# Patient Record
Sex: Male | Born: 1965 | Race: White | Hispanic: No | Smoking: Current every day smoker
Health system: Southern US, Community
[De-identification: ages and names within clinical notes are randomized; demographics above are authoritative.]

## PROBLEM LIST (undated history)

## (undated) VITALS — BP 114/69 | HR 68 | Temp 97.3°F | Resp 18 | Ht 70.0 in | Wt 187.0 lb

## (undated) DIAGNOSIS — F319 Bipolar disorder, unspecified: Secondary | ICD-10-CM

## (undated) DIAGNOSIS — F4310 Post-traumatic stress disorder, unspecified: Secondary | ICD-10-CM

## (undated) DIAGNOSIS — I10 Essential (primary) hypertension: Secondary | ICD-10-CM

## (undated) DIAGNOSIS — F32A Depression, unspecified: Secondary | ICD-10-CM

## (undated) DIAGNOSIS — F329 Major depressive disorder, single episode, unspecified: Secondary | ICD-10-CM

---

## 2011-06-16 ENCOUNTER — Emergency Department (HOSPITAL_COMMUNITY): Payer: Self-pay

## 2011-06-16 ENCOUNTER — Emergency Department (HOSPITAL_COMMUNITY)
Admission: EM | Admit: 2011-06-16 | Discharge: 2011-06-16 | Disposition: A | Discharge status: Home / Self Care | Payer: Self-pay | Attending: Emergency Medicine | Admitting: Emergency Medicine

## 2011-06-16 DIAGNOSIS — R51 Headache: Secondary | ICD-10-CM | POA: Insufficient documentation

## 2011-06-16 DIAGNOSIS — IMO0002 Reserved for concepts with insufficient information to code with codable children: Secondary | ICD-10-CM | POA: Insufficient documentation

## 2011-06-16 DIAGNOSIS — Y9229 Other specified public building as the place of occurrence of the external cause: Secondary | ICD-10-CM | POA: Insufficient documentation

## 2011-06-16 DIAGNOSIS — S060X1A Concussion with loss of consciousness of 30 minutes or less, initial encounter: Secondary | ICD-10-CM | POA: Insufficient documentation

## 2011-06-16 DIAGNOSIS — I10 Essential (primary) hypertension: Secondary | ICD-10-CM | POA: Insufficient documentation

## 2011-06-16 DIAGNOSIS — Z79899 Other long term (current) drug therapy: Secondary | ICD-10-CM | POA: Insufficient documentation

## 2011-06-16 DIAGNOSIS — S0083XA Contusion of other part of head, initial encounter: Secondary | ICD-10-CM | POA: Insufficient documentation

## 2011-06-16 DIAGNOSIS — S0003XA Contusion of scalp, initial encounter: Secondary | ICD-10-CM | POA: Insufficient documentation

## 2011-09-22 ENCOUNTER — Emergency Department (HOSPITAL_COMMUNITY)
Admission: EM | Admit: 2011-09-22 | Discharge: 2011-09-23 | Disposition: A | Discharge status: Other Acute Inpatient Hospital | Payer: Self-pay | Attending: Emergency Medicine | Admitting: Emergency Medicine

## 2011-09-22 DIAGNOSIS — R45851 Suicidal ideations: Secondary | ICD-10-CM | POA: Insufficient documentation

## 2011-09-22 DIAGNOSIS — F329 Major depressive disorder, single episode, unspecified: Secondary | ICD-10-CM | POA: Insufficient documentation

## 2011-09-22 DIAGNOSIS — I10 Essential (primary) hypertension: Secondary | ICD-10-CM | POA: Insufficient documentation

## 2011-09-22 DIAGNOSIS — F3289 Other specified depressive episodes: Secondary | ICD-10-CM | POA: Insufficient documentation

## 2011-09-22 LAB — CBC
HCT: 48.5 % (ref 39.0–52.0)
Hemoglobin: 16.9 g/dL (ref 13.0–17.0)
MCV: 88.7 fL (ref 78.0–100.0)
Platelets: 217 10*3/uL (ref 150–400)
RBC: 5.47 MIL/uL (ref 4.22–5.81)
WBC: 12.4 10*3/uL — ABNORMAL HIGH (ref 4.0–10.5)

## 2011-09-22 LAB — BASIC METABOLIC PANEL
BUN: 15 mg/dL (ref 6–23)
Chloride: 101 mEq/L (ref 96–112)
GFR calc Af Amer: >60 mL/min (ref >60)
Glucose, Bld: 88 mg/dL (ref 70–99)
Potassium: 3.8 mEq/L (ref 3.5–5.1)

## 2011-09-22 LAB — RAPID URINE DRUG SCREEN, HOSP PERFORMED
Amphetamines: NOT DETECTED
Benzodiazepines: POSITIVE — AB

## 2011-09-22 LAB — DIFFERENTIAL
Lymphocytes Relative: 22 % (ref 12–46)
Lymphs Abs: 2.7 10*3/uL (ref 0.7–4.0)
Neutro Abs: 8.5 10*3/uL — ABNORMAL HIGH (ref 1.7–7.7)
Neutrophils Relative %: 69 % (ref 43–77)

## 2011-09-23 ENCOUNTER — Inpatient Hospital Stay (HOSPITAL_COMMUNITY)
Admission: RE | Admit: 2011-09-23 | Discharge: 2011-10-04 | DRG: 885 | Disposition: A | Discharge status: Home / Self Care | Payer: PRIVATE HEALTH INSURANCE | Source: Ambulatory Visit | Attending: Psychiatry | Admitting: Psychiatry

## 2011-09-23 DIAGNOSIS — IMO0002 Reserved for concepts with insufficient information to code with codable children: Secondary | ICD-10-CM

## 2011-09-23 DIAGNOSIS — Z5900 Homelessness unspecified: Secondary | ICD-10-CM

## 2011-09-23 DIAGNOSIS — F329 Major depressive disorder, single episode, unspecified: Principal | ICD-10-CM

## 2011-09-23 DIAGNOSIS — Z6379 Other stressful life events affecting family and household: Secondary | ICD-10-CM

## 2011-09-23 DIAGNOSIS — R45851 Suicidal ideations: Secondary | ICD-10-CM

## 2011-09-23 DIAGNOSIS — F1910 Other psychoactive substance abuse, uncomplicated: Secondary | ICD-10-CM

## 2011-09-23 DIAGNOSIS — X58XXXA Exposure to other specified factors, initial encounter: Secondary | ICD-10-CM

## 2011-09-23 DIAGNOSIS — I10 Essential (primary) hypertension: Secondary | ICD-10-CM

## 2011-09-25 DIAGNOSIS — F1210 Cannabis abuse, uncomplicated: Secondary | ICD-10-CM

## 2011-09-25 DIAGNOSIS — F329 Major depressive disorder, single episode, unspecified: Secondary | ICD-10-CM

## 2011-10-01 LAB — WOUND CULTURE: Gram Stain: NONE SEEN

## 2011-10-02 LAB — COMPREHENSIVE METABOLIC PANEL
ALT: 39 U/L (ref 0–53)
AST: 31 U/L (ref 0–37)
Albumin: 3.8 g/dL (ref 3.5–5.2)
Alkaline Phosphatase: 79 U/L (ref 39–117)
Glucose, Bld: 90 mg/dL (ref 70–99)
Potassium: 3.7 mEq/L (ref 3.5–5.1)
Sodium: 140 mEq/L (ref 135–145)
Total Protein: 6.6 g/dL (ref 6.0–8.3)
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
## 2012-03-05 ENCOUNTER — Encounter (HOSPITAL_COMMUNITY): Payer: Self-pay | Admitting: Emergency Medicine

## 2012-03-05 ENCOUNTER — Emergency Department (HOSPITAL_COMMUNITY): Payer: Medicaid Other

## 2012-03-05 ENCOUNTER — Emergency Department (HOSPITAL_COMMUNITY)
Admission: EM | Admit: 2012-03-05 | Discharge: 2012-03-05 | Disposition: A | Discharge status: Home / Self Care | Payer: Medicaid Other | Attending: Emergency Medicine | Admitting: Emergency Medicine

## 2012-03-05 ENCOUNTER — Other Ambulatory Visit: Payer: Self-pay

## 2012-03-05 DIAGNOSIS — R079 Chest pain, unspecified: Secondary | ICD-10-CM | POA: Insufficient documentation

## 2012-03-05 DIAGNOSIS — R0602 Shortness of breath: Secondary | ICD-10-CM | POA: Insufficient documentation

## 2012-03-05 DIAGNOSIS — R112 Nausea with vomiting, unspecified: Secondary | ICD-10-CM | POA: Insufficient documentation

## 2012-03-05 DIAGNOSIS — R062 Wheezing: Secondary | ICD-10-CM | POA: Insufficient documentation

## 2012-03-05 HISTORY — DX: Essential (primary) hypertension: I10

## 2012-03-05 LAB — CBC
HCT: 47.6 % (ref 39.0–52.0)
Hemoglobin: 16.3 g/dL (ref 13.0–17.0)
MCH: 30 pg (ref 26.0–34.0)
MCHC: 34.2 g/dL (ref 30.0–36.0)
MCV: 87.5 fL (ref 78.0–100.0)

## 2012-03-05 LAB — BASIC METABOLIC PANEL
BUN: 16 mg/dL (ref 6–23)
Calcium: 9.4 mg/dL (ref 8.4–10.5)
GFR calc non Af Amer: 80 mL/min — ABNORMAL LOW (ref >90)
Glucose, Bld: 94 mg/dL (ref 70–99)

## 2012-03-05 MED ORDER — ASPIRIN 325 MG PO TABS
325.0000 mg | ORAL_TABLET | ORAL | Status: AC
  Administered 2012-03-05: 325 mg via ORAL
  Filled 2012-03-05: qty 1

## 2012-03-05 NOTE — Progress Notes (Signed)
Pt listed as self pay with no insurance coverage Pt confirms he is self pay [place] resident.  CM and [facility] coordinator spoke with him Pt offered [facility] services to assist with finding a [place] self pay provider Information not accepted pt not interested

## 2012-03-05 NOTE — ED Notes (Signed)
For the past 3 days he has had intemit epigastric chest pain while at rest, some times has sob, states that he also has pain to llq abd hx of bleeding ulcers,

## 2012-03-05 NOTE — ED Notes (Signed)
Has htn hx but has not been taking meds for a year, has been taking intermit bp meds from family members.

## 2012-03-05 NOTE — ED Notes (Signed)
Pt d/c to home after reviewing d/c instructions to f/u with [facility] MD for HTN control. Pt given smoking ceasation information. Education ie collateral circulation, and renel/stroke/MI cautions.

## 2012-03-05 NOTE — Discharge Instructions (Signed)
Read instructions below for reasons to return to the Emergency Department. It is recommended that your follow up with your Primary Care Doctor in regards to today's visit. If you do not have a doctor, use the resource guide listed below to help you find one. Begin taking over the counter Prilosec or Zegrid as directed.   Chest Pain (Nonspecific)  HOME CARE INSTRUCTIONS  For the next few days, avoid physical activities that bring on chest pain. Continue physical activities as directed.  Do not smoke cigarettes or drink alcohol until your symptoms are gone.  Only take over-the-counter or prescription medicine for pain, discomfort, or fever as directed by your caregiver.  Follow your caregiver's suggestions for further testing if your chest pain does not go away.  Keep any follow-up appointments you made. If you do not go to an appointment, you could develop lasting (chronic) problems with pain. If there is any problem keeping an appointment, you must call to reschedule.  SEEK MEDICAL CARE IF:  You think you are having problems from the medicine you are taking. Read your medicine instructions carefully.  Your chest pain does not go away, even after treatment.  You develop a rash with blisters on your chest.  SEEK IMMEDIATE MEDICAL CARE IF:  You have increased chest pain or pain that spreads to your arm, neck, jaw, back, or belly (abdomen).  You develop shortness of breath, an increasing cough, or you are coughing up blood.  You have severe back or abdominal pain, feel sick to your stomach (nauseous) or throw up (vomit).  You develop severe weakness, fainting, or chills.  You have an oral temperature above 102 F (38.9 C), not controlled by medicine.   THIS IS AN EMERGENCY. Do not wait to see if the pain will go away. Get medical help at once. Call your local emergency services (911 in [place] Do not drive yourself to the hospital.   RESOURCE GUIDE  Dental Problems  Patients with  Medicaid: [facility]                     [facility] [address]                                           [address] Phone:  [phone]                                                  Phone:  [phone]  If unable to pay or uninsured, contact:  [facility] or [place] [facility] to become qualified for the adult dental clinic.  Chronic Pain Problems Contact [facility]  [phone] Patients need to be referred by their primary care doctor.  Insufficient Money for Medicine Contact [employer]:  call "211" or [facility] 271-5999.  No Primary Care Doctor Call [facility]  832-8000 Other agencies that provide inexpensive medical care    [facility]  832-8035    [facility]  832-7272    [facility]  [phone]    Women's Clinic  832-4777    [facility]  [phone]    [facility]  [phone]  Psychological Services   [facility]  [phone] [facility]  [phone] [place] [facility]   800 853-5163 (emergency services 641-4993)  Substance Abuse Resources [facility]  336-882-2125 [facility] 336-784-9470 [facility] 336-285-9073 [facility] 336-845-3988 Residential & Outpatient Substance Abuse Program  800-659-3381  Abuse/Neglect [place] Child Abuse Hotline (336) 641-3795 [place] Child Abuse Hotline 800-378-5315 (After Hours)  Emergency Shelter [facility] (336) 271-5985  [facility] (336) 275-9566 [facility] (704) 372-4663  MRSA Hotline #:   832-7006    [place] Resources  [facility] [place]     [employer]                          [place] [facility] [address] [place]                       [address]      [address]  [place]                                                 [place]                            [place] Phone:  [phone]                                   Phone:  [phone]                 Phone:  [phone]  [place] [facility] Phone:  [phone]  [place] Child Abuse Hotline (336) 342-1394 (336) 342-3537 (After Hours)   

## 2012-03-05 NOTE — ED Provider Notes (Signed)
History     CSN: [id number]  Arrival date & time 03/05/12  4098   First MD Initiated Contact with Patient 03/05/12 1006      Chief Complaint  Patient presents with  . Chest Pain    (Consider location/radiation/quality/duration/timing/severity/associated sxs/prior treatment) HPI Comments: Patient reports that he has had intermittent substernal chest pain over the last 3-5 days.  He reports that the pain does not radiate and typically lasts less than a minute.  The pain comes on both with exertion and at rest.  The pain is associated with some mild SOB, but no nausea or vomiting associated with the chest pain.   Patient reports that he has a history of HTN and currently smokes 1 ppd.  He currently is taking his sister's antihypertensive medications, but is unsure what medication it is.  He has been on Lisinopril in the past, but does not have an insurance so is unable to get the medication.  He does not have a PCP or Cardiologist.  He denies any prior cardiac history.  He does not have any chest pain at this time. Patient denies any recent prolonged travel in the past 4 weeks, denies any surgeries in the past 4 weeks, no prior history of DVT/PE.    Patient is a 46 y.o. male presenting with chest pain. The history is provided by the patient.  Chest Pain The quality of the pain is described as pressure-like. The pain does not radiate. Primary symptoms include shortness of breath, nausea and vomiting. Pertinent negatives for primary symptoms include no fever, no syncope, no cough, no wheezing, no palpitations, no abdominal pain and no dizziness.  Pertinent negatives for associated symptoms include no diaphoresis, no lower extremity edema, no near-syncope and no numbness. He tried nothing for the symptoms.     Past Medical History  Diagnosis Date  . Hypertension     No past surgical history on file.  No family history on file.  History  Substance Use Topics  . Smoking status: Not on  file  . Smokeless tobacco: Not on file  . Alcohol Use:       Review of Systems  Constitutional: Negative for fever, chills and diaphoresis.  HENT: Negative for neck pain and neck stiffness.   Respiratory: Positive for shortness of breath. Negative for cough and wheezing.   Cardiovascular: Positive for chest pain. Negative for palpitations, syncope and near-syncope.  Gastrointestinal: Positive for nausea and vomiting. Negative for abdominal pain.  Neurological: Negative for dizziness, syncope, light-headedness and numbness.    Allergies  Review of patient's allergies indicates not on file.  Home Medications  No current outpatient prescriptions on file.  BP 157/91  Pulse 70  Temp(Src) 97.5 F (36.4 C) (Oral)  Wt 175 lb (79.379 kg)  SpO2 98%  Physical Exam  Nursing note and vitals reviewed. Constitutional: He is oriented to person, place, and time. He appears well-developed and well-nourished. No distress.  HENT:  Head: Normocephalic and atraumatic.  Neck: Normal range of motion. Neck supple.  Cardiovascular: Normal rate, regular rhythm, normal heart sounds and normal pulses.   Pulmonary/Chest: Effort normal. No accessory muscle usage. Not tachypneic. No respiratory distress. He has no decreased breath sounds. He has wheezes. He has no rhonchi. He has no rales.       Mild expiratory wheezing at the bases of both lungs bilaterally  Abdominal: Soft. Bowel sounds are normal. He exhibits no distension and no mass. There is no tenderness. There is no rebound and  no guarding.  Musculoskeletal: Normal range of motion.  Neurological: He is alert and oriented to person, place, and time.  Skin: Skin is warm and dry. No rash noted. He is not diaphoretic.  Psychiatric: He has a normal mood and affect.    ED Course  Procedures (including critical care time)  Labs Reviewed - No data to display No results found.   No diagnosis found.   Date: 03/05/2012  Rate: 69  Rhythm: normal  sinus rhythm  QRS Axis: normal  Intervals: normal  ST/T Wave abnormalities: normal  Conduction Disutrbances:none  Narrative Interpretation:   Old EKG Reviewed: unchanged  12:43 PM Reassessed patient.  Patient reports that he does not have any chest pain at this time.  Patient not in any acute distress.  3:45 PM Reassessed patient.  Patient reports that he does not have any chest pain.  No acute distress.  2nd troponin negative.  MDM  Patient is to be discharged with recommendation to follow up with PCP in regards to today's hospital visit for outpatient stress test. Chest pain is not likely of cardiac or pulmonary etiology d/t presentation, perc negative, VSS, no tracheal deviation, no JVD or new murmur, RRR, breath sounds equal bilaterally, EKG without acute abnormalities, negative troponin (initial and 3 hour), and negative CXR. Pt has been advised start a PPI and return to the ED is CP becomes exertional, associated with diaphoresis or nausea, radiates to left jaw/arm, worsens or becomes concerning in any way. Pt appears reliable for follow up and is agreeable to discharge.   Case has been discussed with and seen by Dr. Hyman Hopes who agrees with the above plan to discharge.         Pascal Lux [place], PA-C 03/06/12 1234

## 2012-03-08 NOTE — ED Provider Notes (Signed)
Medical screening examination/treatment/procedure(s) were conducted as a shared visit with non-physician practitioner(s) and myself.  I personally evaluated the patient during the encounter  RRR, CTAB by my exam. Has PMD f/u.  Forbes Cellar, MD 03/08/12 1020

## 2012-04-02 ENCOUNTER — Ambulatory Visit (HOSPITAL_COMMUNITY)
Admission: RE | Admit: 2012-04-02 | Discharge: 2012-04-02 | Disposition: A | Discharge status: Home / Self Care | Payer: Self-pay | Attending: Psychiatry | Admitting: Psychiatry

## 2012-04-02 ENCOUNTER — Encounter (HOSPITAL_COMMUNITY): Payer: Self-pay | Admitting: Emergency Medicine

## 2012-04-02 ENCOUNTER — Emergency Department (HOSPITAL_COMMUNITY)
Admission: EM | Admit: 2012-04-02 | Discharge: 2012-04-04 | Disposition: A | Discharge status: Other Acute Inpatient Hospital | Payer: Medicaid Other | Attending: Emergency Medicine | Admitting: Emergency Medicine

## 2012-04-02 DIAGNOSIS — F3289 Other specified depressive episodes: Secondary | ICD-10-CM

## 2012-04-02 DIAGNOSIS — I10 Essential (primary) hypertension: Secondary | ICD-10-CM | POA: Insufficient documentation

## 2012-04-02 DIAGNOSIS — F1010 Alcohol abuse, uncomplicated: Secondary | ICD-10-CM | POA: Insufficient documentation

## 2012-04-02 DIAGNOSIS — F17200 Nicotine dependence, unspecified, uncomplicated: Secondary | ICD-10-CM | POA: Insufficient documentation

## 2012-04-02 DIAGNOSIS — F329 Major depressive disorder, single episode, unspecified: Secondary | ICD-10-CM

## 2012-04-02 DIAGNOSIS — F1210 Cannabis abuse, uncomplicated: Secondary | ICD-10-CM | POA: Insufficient documentation

## 2012-04-02 HISTORY — DX: Depression, unspecified: F32.A

## 2012-04-02 HISTORY — DX: Major depressive disorder, single episode, unspecified: F32.9

## 2012-04-02 LAB — CBC
HCT: 47.5 % (ref 39.0–52.0)
Hemoglobin: 16.2 g/dL (ref 13.0–17.0)
MCH: 30.3 pg (ref 26.0–34.0)
MCV: 88.8 fL (ref 78.0–100.0)
RBC: 5.35 MIL/uL (ref 4.22–5.81)

## 2012-04-02 LAB — BASIC METABOLIC PANEL
CO2: 22 mEq/L (ref 19–32)
Calcium: 9 mg/dL (ref 8.4–10.5)
Creatinine, Ser: 1.03 mg/dL (ref 0.50–1.35)
Glucose, Bld: 110 mg/dL — ABNORMAL HIGH (ref 70–99)

## 2012-04-02 LAB — URINALYSIS, ROUTINE W REFLEX MICROSCOPIC
Leukocytes, UA: NEGATIVE
Protein, ur: NEGATIVE mg/dL
Urobilinogen, UA: 0.2 mg/dL (ref 0.0–1.0)

## 2012-04-02 LAB — RAPID URINE DRUG SCREEN, HOSP PERFORMED
Barbiturates: NOT DETECTED
Tetrahydrocannabinol: POSITIVE — AB

## 2012-04-02 LAB — URINE MICROSCOPIC-ADD ON

## 2012-04-02 MED ORDER — ACETAMINOPHEN 325 MG PO TABS: 650.0000 mg | ORAL_TABLET | ORAL | Status: DC | PRN

## 2012-04-02 MED ORDER — IBUPROFEN 600 MG PO TABS: 600.0000 mg | ORAL_TABLET | Freq: Three times a day (TID) | ORAL | Status: DC | PRN

## 2012-04-02 MED ORDER — ZOLPIDEM TARTRATE 5 MG PO TABS: 5.0000 mg | ORAL_TABLET | Freq: Every evening | ORAL | Status: DC | PRN

## 2012-04-02 MED ORDER — LORAZEPAM 1 MG PO TABS
1.0000 mg | ORAL_TABLET | Freq: Three times a day (TID) | ORAL | Status: DC | PRN
  Administered 2012-04-04: 1 mg via ORAL
  Filled 2012-04-02: qty 1

## 2012-04-02 MED ORDER — ALUM & MAG HYDROXIDE-SIMETH 200-200-20 MG/5ML PO SUSP: 30.0000 mL | ORAL | Status: DC | PRN

## 2012-04-02 MED ORDER — PRAZOSIN HCL 2 MG PO CAPS
2.0000 mg | ORAL_CAPSULE | Freq: Every day | ORAL | Status: DC
  Administered 2012-04-02 – 2012-04-03 (×2): 2 mg via ORAL
  Filled 2012-04-02 (×3): qty 1

## 2012-04-02 MED ORDER — ONDANSETRON HCL 4 MG PO TABS
4.0000 mg | ORAL_TABLET | Freq: Three times a day (TID) | ORAL | Status: DC | PRN
  Administered 2012-04-04 (×3): 4 mg via ORAL
  Filled 2012-04-02 (×3): qty 1

## 2012-04-02 MED ORDER — HYDROCHLOROTHIAZIDE 12.5 MG PO CAPS
25.0000 mg | ORAL_CAPSULE | Freq: Every day | ORAL | Status: DC
  Administered 2012-04-02 – 2012-04-04 (×3): 25 mg via ORAL
  Filled 2012-04-02 (×3): qty 2

## 2012-04-02 NOTE — ED Notes (Signed)
MD at bedside. 

## 2012-04-02 NOTE — Consult Note (Signed)
Reason for Consult:Depression Referring Physician: [name] is an 47 y.o. male.  HPI: Patient is complaining of feeling depressed for the last 3 days which is gradually worsening. Patient reportedly has been isolated, withdrawn, not showing interest in usual activities but smoking weed. He reportedly contemplating suicide but no specific intentions or plans. Patient was upset and angry when his Social Security hearing did not happen in 02/27/2012 is scheduled. Reportedly it was postponed to 06/23/2012. Patient reportedly came from [place] with his mom's friend to attend the Social Security hearing. Patient is currently staying with the his sister in [place]. His sister brought him to the hospital when he started feeling depressed. Patient reportedly was hospitalized at [facility] in September 2012. He received medication management.  patient has received the referred treatment from the mental health. His previous medications were Tegretol Thorazine which was not taken for the last 3 weeks because of note no resources. He denies current use of substance abuse or alcohol. Patient has a history of for incarceration night he states sometimes about 20 years of for while and sleep behavior patient reportedly has a 28 years old daughter who is studying in [place] community college. He has no relationships or contacts with his ex-wife's. 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 Patient reportedly had ADHD throughout his childhood and learning problems. His urine drug screen was positive for marijuana.  Past Medical History  Diagnosis Date  . Hypertension   . Depression     History reviewed. No pertinent past surgical history.  Family History  Problem Relation Age of Onset  . Heart attack Other     Social History:  reports that he has been  smoking Cigarettes.  He does not have any smokeless tobacco history on file. He reports that he drinks alcohol. He reports that he uses illicit drugs (Marijuana).  Allergies: No Known Allergies  Medications: I have reviewed the patient's current medications.  Results for orders placed during the hospital encounter of 04/02/12 (from the past 48 hour(s))  CBC     Status: Abnormal   Collection Time   04/02/12  2:50 AM      Component Value Range Comment   WBC 10.8 (*) 4.0 - 10.5 (K/uL)    RBC 5.35  4.22 - 5.81 (MIL/uL)    Hemoglobin 16.2  13.0 - 17.0 (g/dL)    HCT 46.9  62.9 - 52.8 (%)    MCV 88.8  78.0 - 100.0 (fL)    MCH 30.3  26.0 - 34.0 (pg)    MCHC 34.1  30.0 - 36.0 (g/dL)    RDW 41.3  24.4 - 01.0 (%)    Platelets 220  150 - 400 (K/uL)   BASIC METABOLIC PANEL     Status: Abnormal   Collection Time   04/02/12  2:50 AM      Component Value Range Comment   Sodium 135  135 - 145 (mEq/L)    Potassium 3.5  3.5 - 5.1 (mEq/L)    Chloride 99  96 - 112 (mEq/L)    CO2 22  19 - 32 (mEq/L)    Glucose, Bld 110 (*) 70 - 99 (mg/dL)    BUN 23  6 - 23 (mg/dL)    Creatinine, Ser 2.72  0.50 - 1.35 (mg/dL)    Calcium 9.0  8.4 -  10.5 (mg/dL)    GFR calc non Af Amer 85 (*) >90 (mL/min)    GFR calc Af Amer >90  >90 (mL/min)   ETHANOL     Status: Normal   Collection Time   04/02/12  2:50 AM      Component Value Range Comment   Alcohol, Ethyl (B) <11  0 - 11 (mg/dL)   URINE RAPID DRUG SCREEN (HOSP PERFORMED)     Status: Abnormal   Collection Time   04/02/12  3:26 AM      Component Value Range Comment   Opiates NONE DETECTED  NONE DETECTED     Cocaine NONE DETECTED  NONE DETECTED     Benzodiazepines NONE DETECTED  NONE DETECTED     Amphetamines NONE DETECTED  NONE DETECTED     Tetrahydrocannabinol POSITIVE (*) NONE DETECTED     Barbiturates NONE DETECTED  NONE DETECTED    URINALYSIS, ROUTINE W REFLEX MICROSCOPIC     Status: Abnormal   Collection Time   04/02/12  3:26 AM      Component Value  Range Comment   Color, Urine YELLOW  YELLOW     APPearance CLEAR  CLEAR     Specific Gravity, Urine 1.025  1.005 - 1.030     pH 5.5  5.0 - 8.0     Glucose, UA NEGATIVE  NEGATIVE (mg/dL)    Hgb urine dipstick TRACE (*) NEGATIVE     Bilirubin Urine NEGATIVE  NEGATIVE     Ketones, ur TRACE (*) NEGATIVE (mg/dL)    Protein, ur NEGATIVE  NEGATIVE (mg/dL)    Urobilinogen, UA 0.2  0.0 - 1.0 (mg/dL)    Nitrite NEGATIVE  NEGATIVE     Leukocytes, UA NEGATIVE  NEGATIVE    URINE MICROSCOPIC-ADD ON     Status: Abnormal   Collection Time   04/02/12  3:26 AM      Component Value Range Comment   Crystals URIC ACID CRYSTALS (*) NEGATIVE     who and and  No results found.  No psychosis and Positive for anxiety, bad mood, illegal drug usage, learning difficulty and sleep disturbance Blood pressure 129/81, pulse 62, temperature 98.2 F (36.8 C), temperature source Oral, resp. rate 16, SpO2 98.00%.   Assessment/Plan: Depression NOS versus bipolar disorder depressive episode  Noncompliance with treatment and financial difficulties   Recommended acute psychiatric hospitalization for stabilization of depression and suicidal ideation.  Chase Moss,Chase R. 04/02/2012, 5:50 PM
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
## 2012-04-02 NOTE — ED Notes (Signed)
Pt states he is here for depression  Hx of same  Pt states he is angry about a lot and feeling confrontational  Pt states he has been out of his meds for the past 3 weeks

## 2012-04-03 MED ORDER — NICOTINE POLACRILEX 2 MG MT GUM
CHEWING_GUM | OROMUCOSAL | Status: AC
  Administered 2012-04-03: 2 mg via ORAL
  Filled 2012-04-03: qty 2

## 2012-04-03 MED ORDER — NICOTINE POLACRILEX 2 MG MT GUM
2.0000 mg | CHEWING_GUM | OROMUCOSAL | Status: DC | PRN
  Administered 2012-04-03: 2 mg via ORAL

## 2012-04-03 NOTE — ED Provider Notes (Signed)
BP 147/95  Pulse 67  Temp(Src) 98.2 F (36.8 C) (Oral)  Resp 16  SpO2 97% Stable overnight. Pending placement  Loren Racer, MD 04/03/12 [phone]

## 2012-04-04 ENCOUNTER — Encounter (HOSPITAL_COMMUNITY): Payer: Self-pay

## 2012-04-04 ENCOUNTER — Inpatient Hospital Stay (HOSPITAL_COMMUNITY)
Admission: AD | Admit: 2012-04-04 | Discharge: 2012-04-14 | DRG: 885 | Disposition: A | Discharge status: Home / Self Care | Payer: PRIVATE HEALTH INSURANCE | Source: Ambulatory Visit | Attending: Psychiatry | Admitting: Psychiatry

## 2012-04-04 DIAGNOSIS — F1210 Cannabis abuse, uncomplicated: Secondary | ICD-10-CM | POA: Diagnosis present

## 2012-04-04 DIAGNOSIS — R0781 Pleurodynia: Secondary | ICD-10-CM

## 2012-04-04 DIAGNOSIS — R45851 Suicidal ideations: Secondary | ICD-10-CM

## 2012-04-04 DIAGNOSIS — F603 Borderline personality disorder: Secondary | ICD-10-CM | POA: Diagnosis present

## 2012-04-04 DIAGNOSIS — F332 Major depressive disorder, recurrent severe without psychotic features: Principal | ICD-10-CM | POA: Diagnosis present

## 2012-04-04 DIAGNOSIS — I10 Essential (primary) hypertension: Secondary | ICD-10-CM | POA: Diagnosis present

## 2012-04-04 DIAGNOSIS — F4321 Adjustment disorder with depressed mood: Secondary | ICD-10-CM

## 2012-04-04 DIAGNOSIS — F4310 Post-traumatic stress disorder, unspecified: Secondary | ICD-10-CM | POA: Diagnosis present

## 2012-04-04 DIAGNOSIS — F329 Major depressive disorder, single episode, unspecified: Secondary | ICD-10-CM

## 2012-04-04 DIAGNOSIS — R480 Dyslexia and alexia: Secondary | ICD-10-CM | POA: Diagnosis present

## 2012-04-04 DIAGNOSIS — F1020 Alcohol dependence, uncomplicated: Secondary | ICD-10-CM | POA: Diagnosis present

## 2012-04-04 DIAGNOSIS — G4700 Insomnia, unspecified: Secondary | ICD-10-CM | POA: Diagnosis present

## 2012-04-04 MED ORDER — ALUM & MAG HYDROXIDE-SIMETH 200-200-20 MG/5ML PO SUSP: 30.0000 mL | ORAL | Status: DC | PRN

## 2012-04-04 MED ORDER — CHLORPROMAZINE HCL 10 MG PO TABS
10.0000 mg | ORAL_TABLET | Freq: Three times a day (TID) | ORAL | Status: DC
  Administered 2012-04-05 – 2012-04-11 (×19): 10 mg via ORAL
  Filled 2012-04-04 (×25): qty 1

## 2012-04-04 MED ORDER — ALUM & MAG HYDROXIDE-SIMETH 200-200-20 MG/5ML PO SUSP
30.0000 mL | ORAL | Status: DC | PRN
  Administered 2012-04-06 – 2012-04-08 (×2): 30 mL via ORAL

## 2012-04-04 MED ORDER — MAGNESIUM HYDROXIDE 400 MG/5ML PO SUSP: 30.0000 mL | Freq: Every day | ORAL | Status: DC | PRN

## 2012-04-04 MED ORDER — PRAZOSIN HCL 2 MG PO CAPS
2.0000 mg | ORAL_CAPSULE | Freq: Every day | ORAL | Status: DC
  Administered 2012-04-04 – 2012-04-13 (×10): 2 mg via ORAL
  Filled 2012-04-04: qty 1
  Filled 2012-04-04: qty 2
  Filled 2012-04-04 (×9): qty 1

## 2012-04-04 MED ORDER — CITALOPRAM HYDROBROMIDE 20 MG PO TABS
20.0000 mg | ORAL_TABLET | Freq: Every day | ORAL | Status: DC
  Administered 2012-04-04 – 2012-04-13 (×10): 20 mg via ORAL
  Filled 2012-04-04 (×12): qty 1

## 2012-04-04 MED ORDER — CARBAMAZEPINE 200 MG PO TABS
200.0000 mg | ORAL_TABLET | Freq: Three times a day (TID) | ORAL | Status: DC
  Administered 2012-04-05 – 2012-04-14 (×30): 200 mg via ORAL
  Filled 2012-04-04 (×34): qty 1

## 2012-04-04 MED ORDER — HYDROCHLOROTHIAZIDE 12.5 MG PO CAPS
25.0000 mg | ORAL_CAPSULE | Freq: Every day | ORAL | Status: DC
  Administered 2012-04-04 – 2012-04-14 (×11): 25 mg via ORAL
  Filled 2012-04-04 (×13): qty 2

## 2012-04-04 MED ORDER — ACETAMINOPHEN 325 MG PO TABS
650.0000 mg | ORAL_TABLET | ORAL | Status: DC | PRN
  Administered 2012-04-14: 650 mg via ORAL

## 2012-04-04 MED ORDER — IBUPROFEN 600 MG PO TABS
600.0000 mg | ORAL_TABLET | Freq: Three times a day (TID) | ORAL | Status: DC | PRN
  Administered 2012-04-12 – 2012-04-14 (×4): 600 mg via ORAL
  Filled 2012-04-04 (×4): qty 1

## 2012-04-04 MED ORDER — NICOTINE POLACRILEX 2 MG MT GUM: 2.0000 mg | CHEWING_GUM | OROMUCOSAL | Status: DC | PRN

## 2012-04-04 MED ORDER — TRAZODONE HCL 100 MG PO TABS
100.0000 mg | ORAL_TABLET | Freq: Every evening | ORAL | Status: DC | PRN
  Administered 2012-04-04 – 2012-04-10 (×12): 100 mg via ORAL
  Filled 2012-04-04 (×19): qty 1

## 2012-04-04 MED ORDER — ONDANSETRON 4 MG PO TBDP
4.0000 mg | ORAL_TABLET | Freq: Once | ORAL | Status: AC
  Administered 2012-04-04: 4 mg via ORAL

## 2012-04-04 NOTE — ED Notes (Signed)
Pt. States that he is feeling better.

## 2012-04-04 NOTE — ED Notes (Signed)
Pt. Came to RN window stating that he was having chest pain/cramping.  V/s taken, EDP notified of pt. S/s.  New medication order given for zofran, medication given.  Pt. Sitting in chair in room, A/O.  Will continue to monitor pt.

## 2012-04-04 NOTE — ED Notes (Signed)
Called report to LuAnn at [facility].  BH requests pt. To come to [facility] at [phone]

## 2012-04-04 NOTE — ED Notes (Signed)
Pt. States that in the past yr, he has vomited a few times a week, feels it might be related to stomach issues.

## 2012-04-04 NOTE — ED Provider Notes (Signed)
Pt seen and evaluated in psych ED.  He has no current complaints, continuing to await placement.    2:14 PM Pt has been accepted at [facility]- Dr. Dan Humphreys, will be transferred.    Ethelda Chick, MD 04/04/12 1414

## 2012-04-04 NOTE — ED Notes (Signed)
Pt. States that he vomited in Btrm at 1130, states that he also vomited after bkft about 0900.    Pt. Given prn Zofran at 0900.  Pt. Also states that he has not had or taken any of his home medications for 2 months.
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
## 2012-04-04 NOTE — BHH Counselor (Signed)
Pt accepted to [facility] for admission assigned to bed 504-1.

## 2012-04-04 NOTE — Progress Notes (Signed)
Voluntary admission for a 46 y.o. Male with flat affect, depressed mood.  Pt. Reports increased depression and is feeling hopeless and helpless.   Pt. Denies SI/HI and denies A/V hallucinations and contracts for safety.  Pt. Has been noncompliant with medications.   Reports vomiting 3-4 times per week and decrease in appetite.  Pt.  Offered food and oriented to unit.

## 2012-04-04 NOTE — Tx Team (Addendum)
Initial Interdisciplinary Treatment Plan  PATIENT STRENGTHS: (choose at least two) Ability for insight Average or above average intelligence General fund of knowledge  PATIENT STRESSORS: Financial difficulties Health problems   PROBLEM LIST: Problem List/Patient Goals Date to be addressed Date deferred Reason deferred Estimated date of resolution  Depression 04/04/12                                                      DISCHARGE CRITERIA:  Ability to meet basic life and health needs Improved stabilization in mood, thinking, and/or behavior Medical problems require only outpatient monitoring Need for constant or close observation no longer present Verbal commitment to aftercare and medication compliance  PRELIMINARY DISCHARGE PLAN: Return to previous living arrangement  PATIENT/FAMIILY INVOLVEMENT: This treatment plan has been presented to and reviewed with the patient, Chase Moss, and/or family member, .  The patient and family have been given the opportunity to ask questions and make suggestions.  Anju Sereno Dawkins 04/04/2012, 5:05 PM
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
## 2012-04-04 NOTE — Progress Notes (Addendum)
[name] called and asked to speak with this Clinical research associate in concern of her brothers blood pressure. She was concerned of the high blood pressures that wasn't decreased while in the ED. Pt stood with Clinical research associate as I spoke with this family member with his permission as well as another nurse present Chase Moss,  fundraiser). Sister was informed of pt being prescribed Microzide which was just administered at 2034 and Minipress that is scheduled for bedtime. She was also told that we will monitor his blood pressure and physician will be updated on pt's response to medication and makes adjustments as he deems appropriate. Family member was receptive and appreciative of the information given. This phone call occurred around 2040. Pt discussed with Clinical research associate that he has been experiencing n&v for the past 3 days. Pt was given Gatorade to provide electrolytes, pt declined any saltine crackers. Pt forwards a little with discussing his reason for admission. Pt reports worsening depression and says all the things that bother him go way back and it includes being incarcerated. This Clinical research associate understands that this new admission is just adjusting to the unit. Continued support and availability as needed has been extended to this pt. Pt safety remains with q71min checks. Pt is passive SI and contracts for safety.

## 2012-04-05 LAB — TSH: TSH: 1.541 u[IU]/mL (ref 0.350–4.500)

## 2012-04-05 NOTE — Progress Notes (Signed)
  Daquarius Dubeau is a 46 y.o. male [id number] 1966-10-08  04/04/2012 Principal Problem:  *Depression Active Problems:  Suicidal thoughts  Post traumatic stress disorder (PTSD)   Mental Status: Alert & oriented mood is depressed and anxious. Would be suicidal if not in the hospital.Denies HI and AVH.      Subjective/Objective: Dressed in his own clothes . Slept better but has A GI virus. Not able to keep his food down.    Filed Vitals:   04/05/12 1136  BP: 139/103  Pulse: 130  Temp:   Resp: 18    Lab Results:   BMET    Component Value Date/Time   NA 135 04/02/2012 0250   K 3.5 04/02/2012 0250   CL 99 04/02/2012 0250   CO2 22 04/02/2012 0250   GLUCOSE 110* 04/02/2012 0250   BUN 23 04/02/2012 0250   CREATININE 1.03 04/02/2012 0250   CALCIUM 9.0 04/02/2012 0250   GFRNONAA 85* 04/02/2012 0250   GFRAA >90 04/02/2012 0250    Medications:  Scheduled:     . carbamazepine  200 mg Oral TID  . chlorproMAZINE  10 mg Oral TID  . citalopram  20 mg Oral QHS  . hydrochlorothiazide  25 mg Oral Daily  . prazosin  2 mg Oral QHS  . traZODone  100 mg Oral QHS,MR X 1     PRN Meds acetaminophen, alum & mag hydroxide-simeth, ibuprofen, magnesium hydroxide, nicotine polacrilex, DISCONTD: alum & mag hydroxide-simeth Plan: meds restarted no changes today.  Timi Reeser,MICKIE D. 04/05/2012

## 2012-04-05 NOTE — H&P (Signed)
  Pt was seen by me today and I agree with the key elements documented in H&P.  
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
## 2012-04-05 NOTE — Progress Notes (Signed)
Pt has spent a good part of the day in bed stating that he did not feel well. States that he was nauseated, vomiting up his breakfast., but held down his lunch and dinner. Had family come to visit him this evening and his mother called verbalizing her concerns over his blood pressure. Pt did report that he was having some chest discomfort but was not sure it was muscle strain. Has participated more this afternoon after resting. Denies SI and HI. Will continue to monitor.
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
## 2012-04-06 MED ORDER — METOPROLOL TARTRATE 50 MG PO TABS
50.0000 mg | ORAL_TABLET | Freq: Every day | ORAL | Status: DC
  Administered 2012-04-06 – 2012-04-14 (×9): 50 mg via ORAL
  Filled 2012-04-06 (×12): qty 1

## 2012-04-06 NOTE — Progress Notes (Signed)
Patient ID: Chase Moss, male   DOB: 07-02-1966, 46 y.o.   MRN: [id number] Stated has been nauseated off and on this evening, has been sipping ginger ale, bp was still somewhat elevated this evening.  Felt well enough to attended group and has been interacting with select peers.  After taking hs meds and sleeping pill;, went to dayroom and was watching tv lying on a sofa.  Didn't want to go to bed, then came back for repeat sleeping pill.  Encouraged to work with his meds.  Will continue to monitor.

## 2012-04-06 NOTE — Progress Notes (Signed)
[facility] Group Notes:  (Counselor/Nursing/MHT/Case Management/Adjunct)  04/06/2012 1315  Type of Therapy:  Group Therapy  Participation Level:  Active  Participation Quality:  Appropriate  Affect:  Appropriate  Cognitive:  Appropriate  Insight:  Good  Engagement in Group:  Good  Engagement in Therapy:  Good  Modes of Intervention:  Clarification, Problem-solving, Socialization and Support  Summary of Progress/Problems:  Pt. participated in group session on supports. Pt. was asked what support means to them, who are their supports, what is the difference between unhealthy and healthy supports and what they can do when their support is not there. Pt stated that he receives support from his family. Pt stated that he supports himself everyday by taking care of his personal needs such as brushing his teeth before starting his day.   Crosswell, Desiree 04/06/2012, 3:09 PM

## 2012-04-06 NOTE — Progress Notes (Signed)
Pt came stating he didn't feel well. Blood pressure was 171/98 with a pulse of 114 And standing 156/111 112 pulse 112.  Lynann Bologna called and orders received for a one time dose of metoprolol 50mg  and then one daily, EKG and to d/c the thorazine. Pt c/o slight nausea. Given support.

## 2012-04-06 NOTE — Progress Notes (Signed)
  Ovadia Lopp is a 46 y.o. male [id number] Mar 10, 1966  04/04/2012 Principal Problem:  *Depression Active Problems:  Suicidal thoughts  Post traumatic stress disorder (PTSD)   Mental Status: Alert & oriented mood is depressed denies active SI/HI/AVH   Subjective/Objective: Still depressed  Still nauseous but has not vomited today. Anxious about his future-hard to be patient.    Filed Vitals:   04/06/12 0852  BP: 160/93  Pulse: 140  Temp:   Resp:     Lab Results:   BMET    Component Value Date/Time   NA 135 04/02/2012 0250   K 3.5 04/02/2012 0250   CL 99 04/02/2012 0250   CO2 22 04/02/2012 0250   GLUCOSE 110* 04/02/2012 0250   BUN 23 04/02/2012 0250   CREATININE 1.03 04/02/2012 0250   CALCIUM 9.0 04/02/2012 0250   GFRNONAA 85* 04/02/2012 0250   GFRAA >90 04/02/2012 0250    Medications:  Scheduled:     . carbamazepine  200 mg Oral TID  . chlorproMAZINE  10 mg Oral TID  . citalopram  20 mg Oral QHS  . hydrochlorothiazide  25 mg Oral Daily  . prazosin  2 mg Oral QHS  . traZODone  100 mg Oral QHS,MR X 1     PRN Meds acetaminophen, alum & mag hydroxide-simeth, ibuprofen, magnesium hydroxide, nicotine polacrilexPlan:   Plan: continue current plan of care. Marleny Faller,MICKIE D. 04/06/2012

## 2012-04-06 NOTE — Progress Notes (Signed)
Pt states that he is feeling better today. Attended the groups, participates and interacts appropriately with his peers. Rates his depression and hopelessness both at an 8. Admits suicidal thoughts but contracts for safety. Was able to go outside and play basketball and was observed laughing and joking with his peers. Given support and praise

## 2012-04-06 NOTE — Progress Notes (Signed)
[facility] Group Notes:  (Counselor/Nursing/MHT/Case Management/Adjunct)  04/06/2012 0830  Type of Therapy:  Discharge Planning  Participation Level:  Active  Summary of Progress/Problems: Pt. attended and participated in aftercare planning group. [facility] support group information was given as well as information on suicide prevention information, warning signs to look for with suicide and crisis line numbers to use. Pt stated that he is feeling better than yesterday. Pt stated that his depression is high today and rated depression at an 8. Pt states that before being admitted, he would sleep and stay in bed for 16 hours or more. Pt states that when he isolates and is not active is when he has SI. Pt states that he is having SI on and off, but if he continues to come to groups he will be able to control his thoughts better.   Crosswell, Desiree 04/06/2012, 10:08 AM

## 2012-04-07 DIAGNOSIS — F332 Major depressive disorder, recurrent severe without psychotic features: Principal | ICD-10-CM

## 2012-04-07 NOTE — Progress Notes (Signed)
[facility] MD Progress Note  04/07/2012 9:46 AM  Diagnosis:  Axis I: Major Depression, Recurrent severe and Post Traumatic Stress Disorder Axis II: Borderline Personality Dis.  ADL's:  Intact  Sleep: Poor  Appetite:  Poor  Suicidal Ideation:  Pt denies any suicidal thoughts now Homicidal Ideation:  Denies adamantly any homicidal thoughts.  Mental Status Examination/Evaluation: Objective:  Appearance: Disheveled  Eye Contact::  Good  Speech:  Clear and Coherent  Volume:  Normal  Mood:  6 /10 on a scale of 1 is the best and 10 is the worst  Anxiety: 9/10 on the same scale  Affect:  Congruent  Thought Process:  Coherent  Orientation:  Full  Thought Content:  WDL  Suicidal Thoughts:  No  Homicidal Thoughts:  No  Memory:  Immediate;   Good  Judgement:  Fair  Insight:  Fair  Psychomotor Activity:  Normal  Concentration:  Fair  Recall:  Fair  Akathisia:  No  Handed:  Right  AIMS (if indicated):     Assets:  Communication Skills Desire for Improvement Housing Physical Health Resilience Social Support  Sleep:  Number of Hours: 3.25    ROS: GI: feels better that he has felt in the last 3 days, thinks he is able to eat, but missed breakfast because of feeling too tires., no N/V/D/cramps  Neuro: did not sleep well last night, no dizziness, ataxia, headache, weakness now.  Had headache when his BP was elevated.  CV; no chest cramps, headaches, chest pain, dizziness or syncopy  Vital Signs:Blood pressure 141/97, pulse 94, temperature 97.6 F (36.4 C), temperature source Oral, resp. rate 16, height 5\' 8"  (1.727 m), weight 80.74 kg (178 lb), SpO2 98.00%. Current Medications: Current Facility-Administered Medications  Medication Dose Route Frequency Provider Last Rate Last Dose  . acetaminophen (TYLENOL) tablet 650 mg  650 mg Oral Q4H PRN Chase Spare, NP      . alum & mag hydroxide-simeth (MAALOX/MYLANTA) 200-200-20 MG/5ML suspension 30 mL  30 mL Oral Q4H PRN Chase Spare, NP   30 mL at 04/06/12 2315  . carbamazepine (TEGRETOL) tablet 200 mg  200 mg Oral TID Chase D. Adams, PA   200 mg at 04/07/12 0937  . chlorproMAZINE (THORAZINE) tablet 10 mg  10 mg Oral TID Chase D. Adams, PA   10 mg at 04/07/12 0937  . citalopram (CELEXA) tablet 20 mg  20 mg Oral QHS Chase D. Adams, PA   20 mg at 04/06/12 2223  . hydrochlorothiazide (MICROZIDE) capsule 25 mg  25 mg Oral Daily Chase Spare, NP   25 mg at 04/07/12 0936  . ibuprofen (ADVIL,MOTRIN) tablet 600 mg  600 mg Oral Q8H PRN Chase Spare, NP      . magnesium hydroxide (MILK OF MAGNESIA) suspension 30 mL  30 mL Oral Daily PRN Chase Spare, NP      . metoprolol (LOPRESSOR) tablet 50 mg  50 mg Oral Daily Chase Spare, NP   50 mg at 04/07/12 1610  . nicotine polacrilex (NICORETTE) gum 2 mg  2 mg Oral PRN Chase Spare, NP      . prazosin (MINIPRESS) capsule 2 mg  2 mg Oral QHS Chase Spare, NP   2 mg at 04/06/12 2223  . traZODone (DESYREL) tablet 100 mg  100 mg Oral QHS,MR X 1 Chase D. Adams, PA   100 mg at 04/06/12 2315    Lab Results: No results found for this or any previous visit (  from the past 48 hour(s)).  Physical Findings: AIMS:  , ,  ,  ,    CIWA:    COWS:     Treatment Plan Summary: Daily contact with patient to assess and evaluate symptoms and progress in treatment Medication management  Plan: Continue current treatment plan.  Chase Moss 04/07/2012, 9:46 AM

## 2012-04-07 NOTE — Progress Notes (Signed)
Patient ID: Chase Moss, male   DOB: [birth year], 46 y.o.   MRN: [id number] Pt. Walking back and forth down the hall. Pt. Says it helps with the anger. Pt. Reports "today better than yesterday and the day before, since I left the ED" "I'm able to eat now" Pt. Reports depression at "7", but says anxiety is higher at "8".  Pt. Says he will be going to group. Staff will monitor q21min for safety.

## 2012-04-07 NOTE — Tx Team (Signed)
Interdisciplinary Treatment Plan Update (Adult)  Date:  04/07/2012  Time Reviewed:  10:20 AM   Progress in Treatment: Attending groups: Yes, attended groups over the weekend, did not attend aftercare group this morning Participating in groups:  Yes Taking medication as prescribed:  Yes Tolerating medication: Yes Family/Significant othe contact made:  Yes, contact made with: sister, Albin Felling Patient understands diagnosis: Yes Discussing patient identified problems/goals with staff:  Yes Medical problems stabilized or resolved: Yes Denies suicidal/homicidal ideation: Yes Issues/concerns per patient self-inventory:  No  Other:  New problem(s) identified: None  Reason for Continuation of Hospitalization: Depression  Interventions implemented related to continuation of hospitalization:  Medication stabilization, safety checks q 15 mins, group attendance  Additional comments:  Estimated length of stay: 3-4 days  Discharge Plan: Discharge home, follow up with   New goal(s):  Review of initial/current patient goals per problem list:   1.  Goal(s): Decrease depressive symptoms to 4 or less  Met:  No  Target date:by discharge  As evidenced by: Chase Moss rates depression at a 7 today  2.  Goal (s): Reduce potential for self-harm/suicide  Met:  No  Target date: by discharge  As evidenced by: Chase Moss reports suicidal thoughts at different times throughout the day, especially around times that he sleeps  3.  Goal(s): Medication stabilization  Met:  Yes  Target date: by discharge  As evidenced by: Chase Moss reports his medications seem to be working and he does not have any intolerable side effects  4.  Goal(s): Decrease anxiety symptoms to a rating of 4 or less  Met:  No  Target date: by discharge  As evidenced by: Chase Moss rates anxiety at an 8 today  Attendees: Patient:     Family:     Physician:  Dr Orson Aloe, MD 04/07/2012 10:20 AM  Nursing:   Quintella Reichert, RN  04/07/2012 10:20 AM  Case Manager:  Juline Patch, LCSW 04/07/2012 10:20 AM  Counselor:  Angus Palms, LCSW 04/07/2012 10:20 AM  Other:  Reyes Ivan, LCSWA 04/07/2012 10:20 AM  Other:  Osborn Coho, LCSWA 04/07/2012 10:20 AM  Other:     Other:      Scribe for Treatment Team:   [name], 04/07/2012 10:20 AM

## 2012-04-07 NOTE — Progress Notes (Signed)
Was still in bed late this morning, sleeping, would not get up for a.m. meds until staff had been in his room several times, went to DR for meals, up to groups, interacting w/peers in the dayroom. Denies si or Hi q80min safety checks continue and support offered safey maintained

## 2012-04-07 NOTE — Progress Notes (Signed)
Received report from [employer] RN that pt needed a stat EKG due to high blood pressure readings and mild nausea which he has reported throughout his stay here. EKG obtained and indicates normal sinus rhythm with heart rates in the 60s-80s range. He denies any chest pain or pressure. Orders given for lopressor 50mg  to be given which was completed. On r/a, pt's BP improved. Pt is very anxious and is on the phone almost constantly throughout the evening with his family. This RN spoke with pt's sister who was very angry his elevated BP had not been addressed before tonight. Explained to her at length what has taken place thus far and what the plan is for her brother going forward. At start of phone call pt's sister threatening to call 911 but this writer was able to successfully diffuse sister's frustrations. Pt's anxiety decreased as his night progressed. He was med compliant and is currently resting quietly in bed. Denied SI/HI/AVH. Lawrence [place]se[place]

## 2012-04-07 NOTE — Progress Notes (Signed)
[facility] Group Notes: (Counselor/Nursing/MHT/Case Management/Adjunct) 04/07/2012   @  11:00am Overcoming Obstacles to Wellness   Type of Therapy:  Group Therapy  Participation Level:  Good  Participation Quality: Attentive, Appropriate, Sharing    Affect:  Appropriate  Cognitive:  Appropriate  Insight:  Good  Engagement in Group: Good  Engagement in Therapy:  Good  Modes of Intervention:  Support and Exploration  Summary of Progress/Problems:  Chase Moss explored various aspects of wellness, including intellectual wellness. He initially stated he isn't smart enough to know what the means, then later was able to discuss various ways of increasing intellectual wellness. Chase Moss seemed to relate well to other group members and served a calming role in the group dynamic.  [name] 04/07/2012 4:06 PM      [facility] Group Notes: (Counselor/Nursing/MHT/Case Management/Adjunct) 04/07/2012   @1 :15pm  Type of Therapy:  Group Therapy  Participation Level:  Active  Participation Quality: Attentive, Supportive, Appropriate   Affect:  Appropriate  Cognitive:  Appropriate  Insight:  Good  Engagement in Group: Good  Engagement in Therapy:  Good  Modes of Intervention:  Support and Exploration  Summary of Progress/Problems: Cian was mostly quiet throughout group, though he did state that changes are hard to make because they require a person to put energy into doing something different.  He provided appropriate and helpful feedback to help another group member calm down and take a more realistic perspective when she was overwhelmed with emotion.   [name] 04/07/2012 4:08 PM

## 2012-04-07 NOTE — Progress Notes (Signed)
This Clinical research associate was able to engage pt in a conversation about what brought him into the hospital and his discharge plans. Upon entering, pts room, pt was in the bed and appeared to be asleep but was easily aroused. Pt presented with a flat affect and mood.  Pt verbalized that "he got depressed due to different events in his life." Pt verbalized that" he was scheduled for an SSI hearing and that his lawyer didn't show up, so he was informed to fire his lawyer and to find another lawyer." " My SSI hearing has been rescheduled and I have been waiting for three years." " I didn't have an appetite." Pt verbalized that he was receiving previous mental health treatment from [facility]. Pt stated that he was receiving his meds from there and also seeing a therapist. " My SSI hearing was scheduled for July 1st but has now been moved to May 8th." Pt verbalized that " he was previously staying with his sister and doesn't want to return back there." "My brother said I could come to [place] but there is no bus route." Pt verbalized that " he needs a place to go to for a few weeks." Pt verbalized that he doesn't have any transportation at this time and cant afford his medications. "I'm tired and if my hearing doesn't go well, I already know." Pt rated his depression 7, anxiety 8, hopelessness 7, helplessness 8. Pt stated that "he doesn't have any SI/HI thoughts at this time but does have them throughout the day." "The only time I don't have SI thoughts are when I'm asleep." Pt verbalized that he is able to contract for safety. " I just want to help my mom, whose in [place].

## 2012-04-08 NOTE — Progress Notes (Signed)
Pt attended discharge planning group and actively participated.  Pt presents with flat affect and depressed mood.  Pt ranks depression and anxiety at a 7 today.  Pt denies SI, stating "not yet".  Pt states that he is unsure of a d/c plan at this time.  Pt states that he was living in [place] with his brother, but states this is not the best environment due to substance abuse.  Pt states that he is unsure if he will return to [place] to live or not.  No further needs voiced by pt at this time.    Reyes Ivan, LCSWA 04/08/2012  10:38 AM

## 2012-04-08 NOTE — Progress Notes (Signed)
[facility] Group Notes: (Counselor/Nursing/MHT/Case Management/Adjunct) 04/08/2012   @ 11:00 Feelings About Diagnosis  Type of Therapy:  Group Therapy  Participation Level:  Minimal  Participation Quality:  Attentive   Affect:  Irritable  Cognitive:  Appropriate  Insight:  None  Engagement in Group: Minimal  Engagement in Therapy: None  Modes of Intervention:  Support and Exploration  Summary of Progress/Problems: Chase Moss  was attentive but not engaged in group process     [name] 04/08/2012  2:16 PM      [facility] Group Notes:  (Counselor/Nursing/MHT/Case Management/Adjunct) 04/08/2012   1:15PM Breathing & Meditation for Anxiety/Anger   Type of Therapy:  Group Therapy  Participation Level:  Did Not Attend     [name] 04/08/2012  2:17 PM

## 2012-04-08 NOTE — Progress Notes (Signed)
Grief and Loss Group  Facilitated grief and loss group on [facility] [address]. Reviewed group rules/confidentiality/privacy. Discussed types of loss (re: death, job, financial, self-esteem, control, relationships) and normal grief reactions (re: shock, anger, sadness, depression). Engaged the group in an activity in which ~30 pictures were laid out on a table and group members were asked to pick out a picture that represents their grief. The group engaged in the activity, was progressively engaged w/ one another in sharing. Group transitioned into talking about healing near end of group.  Pt was active in the group, engaged in the activity but did not select a picture, stated that none stood out to him. Pt related to other members and provided support. Pt stated that he has been through many losses and has been overtaken by grief in the past to the point of depression and suicidal thoughts. Pt stated it was nice for him to know that others are and have experienced what he is going through.  Tamiya Colello B MS, LPCA, NCC

## 2012-04-08 NOTE — Progress Notes (Signed)
Patient ID: Chase Moss, male   DOB: 1966/02/04, 46 y.o.   MRN: [id number] Patient's self inventory, needs medication for sleep, improving appetite, normal energy level, improving attention span.  Depression #6 off/on.  Rated hopelessness #6.  SI off/on, contracts for safety.  Complains of sweating at night.  Pain goal #1, worst pain #1.  After discharge, plans to keep up with and taker medications.  Will talk to staff about problems at proper time with concerns.  No discharge plans.  No problems taking meds after discharge.   Talking loud and fussing at someone on phone this afternoon.   Went to recreation outside this afternoon.

## 2012-04-08 NOTE — Progress Notes (Signed)
[facility] MD Progress Note  04/08/2012 3:42 PM  S: "I'm trying to take a nap. I am not sleeping well at night because I have been sweating a lot. I have to get up numerous times to change my night clothes and my sheets. I feel very tired a lot"  Diagnosis:   Axis I: Post Traumatic Stress Disorder Axis II: Deferred Axis III:  Past Medical History  Diagnosis Date  . Hypertension   . Depression    Axis IV: No changes Axis V: 51-60 moderate symptoms  ADL's:  Intact  Sleep: Fair  Appetite:  Good  Suicidal Ideation:  Plan:  No Intent:  No Means:  No Homicidal Ideation:  Plan:  No Intent:  No Means:  No  AEB (as evidenced by): per patient's report.  Mental Status Examination/Evaluation: Objective:  Appearance: Casual  Eye Contact::  Good  Speech:  Clear and Coherent  Volume:  Normal  Mood:  Euthymic  Affect:  Appropriate  Thought Process:  Coherent  Orientation:  Full  Thought Content:  Rumination  Suicidal Thoughts:  No  Homicidal Thoughts:  No  Memory:  Immediate;   Good Recent;   Good Remote;   Good  Judgement:  Fair  Insight:  Fair  Psychomotor Activity:  Normal  Concentration:  Good  Recall:  Good  Akathisia:  No  Handed:  Right  AIMS (if indicated):     Assets:  Desire for Improvement  Sleep:  Number of Hours: 5.75    Vital Signs:Blood pressure 125/78, pulse 69, temperature 97.3 F (36.3 C), temperature source Oral, resp. rate 18, height 5\' 8"  (1.727 m), weight 80.74 kg (178 lb), SpO2 97.00%. Current Medications: Current Facility-Administered Medications  Medication Dose Route Frequency Provider Last Rate Last Dose  . acetaminophen (TYLENOL) tablet 650 mg  650 mg Oral Q4H PRN Viviann Spare, NP      . alum & mag hydroxide-simeth (MAALOX/MYLANTA) 200-200-20 MG/5ML suspension 30 mL  30 mL Oral Q4H PRN Viviann Spare, NP   30 mL at 04/06/12 2315  . carbamazepine (TEGRETOL) tablet 200 mg  200 mg Oral TID Mickie D. Adams, PA   200 mg at 04/08/12 1157  .  chlorproMAZINE (THORAZINE) tablet 10 mg  10 mg Oral TID Mickie D. Adams, PA   10 mg at 04/08/12 1157  . citalopram (CELEXA) tablet 20 mg  20 mg Oral QHS Mickie D. Adams, PA   20 mg at 04/07/12 2220  . hydrochlorothiazide (MICROZIDE) capsule 25 mg  25 mg Oral Daily Viviann Spare, NP   25 mg at 04/08/12 0848  . ibuprofen (ADVIL,MOTRIN) tablet 600 mg  600 mg Oral Q8H PRN Viviann Spare, NP      . magnesium hydroxide (MILK OF MAGNESIA) suspension 30 mL  30 mL Oral Daily PRN Viviann Spare, NP      . metoprolol (LOPRESSOR) tablet 50 mg  50 mg Oral Daily Viviann Spare, NP   50 mg at 04/08/12 0848  . nicotine polacrilex (NICORETTE) gum 2 mg  2 mg Oral PRN Viviann Spare, NP      . prazosin (MINIPRESS) capsule 2 mg  2 mg Oral QHS Viviann Spare, NP   2 mg at 04/07/12 2220  . traZODone (DESYREL) tablet 100 mg  100 mg Oral QHS,MR X 1 Mickie D. Adams, PA   100 mg at 04/07/12 2220    Lab Results: No results found for this or any previous visit (from the past 48  hour(s)).  Physical Findings: AIMS:  , ,  ,  ,    CIWA:    COWS:     Treatment Plan Summary: Daily contact with patient to assess and evaluate symptoms and progress in treatment Medication management  Plan: Obtain HGBA1C       Continue current treatment plan.  Armandina Stammer I 04/08/2012, 3:42 PM

## 2012-04-09 NOTE — Progress Notes (Signed)
[facility] Group Notes: (Counselor/Nursing/MHT/Case Management/Adjunct) 04/09/2012   @1 :15pm  Type of Therapy:  Group Therapy  Participation Level:  Active  Participation Quality: Attentive, Sharing, Appropriate    Affect:  Blunted  Cognitive:  Appropriate  Insight:  Good  Engagement in Group: Good  Engagement in Therapy:  Good  Modes of Intervention:  Support and Exploration  Summary of Progress/Problems: Chase Moss came to group a bit late, but joined in. He shared briefly that she does not allow himself to experience sadness, but due to his beliefs and bout crying and weakness he funnels that into the secondary emotion of anger. He was receptive to discussion of how this effects his sense of self-worth. Chase Moss explored his happy memories, talking about being at the state or county fair, and how this made him excited. He was able to identify how just talking about these memories and engaging such thoughts decreased his feelings of depression for the moment and was receptive to the idea of seeking out the good/happy things in order to gain control over his "negative" emotions.   [name] 04/09/2012 3:46 PM

## 2012-04-09 NOTE — Progress Notes (Signed)
Patient ID: Chase Moss, male   DOB: [birth year], 46 y.o.   MRN: [id number]   Patient pleasant on approach today. Reports depression and feelings of hopelessness "5" on scale. Currently denies any SI at this time. Taking medications without issue. Staff will monitor and encourage group attendance.

## 2012-04-09 NOTE — Progress Notes (Signed)
D:  Pt gave underwriter a folded up note.  Note was added to pt chart.  Note cross the boundaries of patient-staff relationship.  Underwriter and RN talked with pt on inappropriate setting for behaviors and to concentrate on therapeutic goals he set while at the hospital.  Confirmed with pt that behaviors toward underwriter were not encouraged at anytime by staff.  A:  Reported to charge RN and Charity fundraiser.  RN and Conservator, museum/gallery met with pt to discuss behaviors appropriate and inappropriate for unit.  R:  Pt apologized for actions.  Pt said as he stated in the letter "to let him know if he was out of line."  Pt is safe. Chase Moss, Chase Moss, MHT/NS

## 2012-04-09 NOTE — Progress Notes (Signed)
Pt visible on the unit and attended evening group.  Pt reports his day has gotten better as it progressed.  He says suicidal thoughts come and go, but he is able to contract for safety.  He voices no needs/concerns at this time.  He is unsure of his discharge plans at this time.  Safety maintained with q15 minute checks.

## 2012-04-09 NOTE — Progress Notes (Addendum)
[facility] MD Progress Note  04/09/2012 3:41 PM  S: "I don't do too well when I'm cooked up in one place, like I am in this hospital.       I have too much energy to spend. i am doing what I love doing right now, outside, playing hoops. I like out doors a lot. Please don't ask me to come inside. I am enjoying what is going on right now"  O: Observed patient playing basket ball with the other patients at the gym. Appeared to be in good mood.   Diagnosis:   Axis I: Post Traumatic Stress Disorder,  Axis II: Deferred Axis III:  Past Medical History  Diagnosis Date  . Hypertension   . Depression    Axis IV: No changes Axis V: 65  ADL's:  Intact  Sleep: Fair  Appetite:  Good  Suicidal Ideation:  Plan:  No Intent:  No Means:  No Homicidal Ideation:  Plan:  No Intent:  no Means:  No  AEB (as evidenced by): Per patient reports.  Mental Status Examination/Evaluation: Objective:  Appearance: Casual  Eye Contact::  Good  Speech:  Clear and Coherent  Volume:  Normal  Mood:  Euthymic  Affect:  Appropriate  Thought Process:  Coherent and Intact  Orientation:  Full  Thought Content:  Rumination  Suicidal Thoughts:  No  Homicidal Thoughts:  No  Memory:  Immediate;   Good Recent;   Good Remote;   Good  Judgement:  Good  Insight:  Good  Psychomotor Activity:  Normal  Concentration:  Good  Recall:  Good  Akathisia:  No  Handed:  Right  AIMS (if indicated):     Assets:  Communication Skills Desire for Improvement  Sleep:  Number of Hours: 6.25    Vital Signs:Blood pressure 144/95, pulse 66, temperature 96.5 F (35.8 C), temperature source Oral, resp. rate 16, height 5\' 8"  (1.727 m), weight 80.74 kg (178 lb), SpO2 98.00%. Current Medications: Current Facility-Administered Medications  Medication Dose Route Frequency Provider Last Rate Last Dose  . acetaminophen (TYLENOL) tablet 650 mg  650 mg Oral Q4H PRN Viviann Spare, NP      . alum & mag hydroxide-simeth  (MAALOX/MYLANTA) 200-200-20 MG/5ML suspension 30 mL  30 mL Oral Q4H PRN Viviann Spare, NP   30 mL at 04/08/12 2216  . carbamazepine (TEGRETOL) tablet 200 mg  200 mg Oral TID Mickie D. Adams, PA   200 mg at 04/09/12 1202  . chlorproMAZINE (THORAZINE) tablet 10 mg  10 mg Oral TID Mickie D. Adams, PA   10 mg at 04/09/12 1202  . citalopram (CELEXA) tablet 20 mg  20 mg Oral QHS Mickie D. Adams, PA   20 mg at 04/08/12 2143  . hydrochlorothiazide (MICROZIDE) capsule 25 mg  25 mg Oral Daily Viviann Spare, NP   25 mg at 04/09/12 0810  . ibuprofen (ADVIL,MOTRIN) tablet 600 mg  600 mg Oral Q8H PRN Viviann Spare, NP      . magnesium hydroxide (MILK OF MAGNESIA) suspension 30 mL  30 mL Oral Daily PRN Viviann Spare, NP      . metoprolol (LOPRESSOR) tablet 50 mg  50 mg Oral Daily Viviann Spare, NP   50 mg at 04/09/12 0810  . nicotine polacrilex (NICORETTE) gum 2 mg  2 mg Oral PRN Viviann Spare, NP      . prazosin (MINIPRESS) capsule 2 mg  2 mg Oral QHS Viviann Spare, NP  2 mg at 04/08/12 2143  . traZODone (DESYREL) tablet 100 mg  100 mg Oral QHS,MR X 1 Mickie D. Adams, PA   100 mg at 04/08/12 2300    Lab Results:  Results for orders placed during the hospital encounter of 04/04/12 (from the past 48 hour(s))  HEMOGLOBIN A1C     Status: Normal   Collection Time   04/09/12  6:17 AM      Component Value Range Comment   Hemoglobin A1C 5.1  <5.7 (%)    Mean Plasma Glucose 100  <117 (mg/dL)     Physical Findings: AIMS:  , ,  ,  ,    CIWA:    COWS:     Treatment Plan Summary: Daily contact with patient to assess and evaluate symptoms and progress in treatment Medication management  Plan: Hemoglobin A1C report 5.1.        Patient made aware of the above report.     Continue current treatment plan.  Armandina Stammer I 04/09/2012, 3:41 PM

## 2012-04-09 NOTE — Progress Notes (Signed)
[facility] Group Notes: (Counselor/Nursing/MHT/Case Management/Adjunct) 04/09/2012    11:00am Emotion Regulation  Type of Therapy:  Group Therapy  Participation Level:  Did Not Attend   Irlene Crudup Taree Hayes 03/26/2012 12:52 PM 

## 2012-04-09 NOTE — Progress Notes (Signed)
Patient seen during d/c planning group and individually.  He advised of having problems with irritability and feeling on edge.  Patient advised he has thought of running and breaking the window at the end of the hall but knows he is here to get better. He contracted for safety and agreed to talk with staff if we had those feelings again or other feelings of being unsafe. MD advised.  Patient denies SI/HI. He rates depression and anxiety at seven and hopelessness at five.  He is hopeful to discharge by the weekend.

## 2012-04-09 NOTE — Progress Notes (Signed)
04/09/2012         Time: 1415      Group Topic/Focus: The focus of this group is on enhancing the patient's understanding of leisure, barriers to leisure, and the importance of engaging in positive leisure activities upon discharge for improved total health.  Participation Level: Active  Participation Quality: Appropriate  Affect: Appropriate  Cognitive: Appropriate   Additional Comments: Patient was able to identify positive leisure activities he can use upon discharge.   Vannary Greening 04/09/2012 3:49 PM

## 2012-04-09 NOTE — Progress Notes (Signed)
This Clinical research associate was approached by one of the MHTs on the unit who showed me a letter written to her by the patient which contained personal feelings towards her.  This Clinical research associate and the MHT met with the pt and discussed the inappropriateness of his actions.  Pt states he realized that he should not have written the letter and says he was just feeling lonely, and she seemed very friendly and attractive.  He apologized for his actions.  Staff encouraged him to concentrate his efforts on his treatment.  Pt voiced understanding.

## 2012-04-10 DIAGNOSIS — F1020 Alcohol dependence, uncomplicated: Secondary | ICD-10-CM | POA: Diagnosis present

## 2012-04-10 NOTE — Progress Notes (Signed)
Patient ID: Chase Moss, male   DOB: Apr 17, 1966, 46 y.o.   MRN: [id number] Pt is awake and active on the unit this AM. Pt is attending groups and is cooperative with staff. Pt denies SI/HI and AVH. Pt forwards little and his interacting is minimal. Pt mood and affect are anxious today. Writer will continue to monitor.

## 2012-04-10 NOTE — Progress Notes (Addendum)
[facility] Group Notes: (Counselor/Nursing/MHT/Case Management/Adjunct) 04/10/2012   @  11:00am  Finding Balance in Life  Type of Therapy:  Group Therapy  Participation Level:  Active  Participation Quality:  Attentive, Sharing, Appropriate  Affect:  Blunted, Tearful  Cognitive:  Appropriate  Insight:  Good  Engagement in Group: Good  Engagement in Therapy:  Good  Modes of Intervention:  Support and Exploration  Summary of Progress/Problems: Gean was very quiet through the first part of group. However, at the end he shared that his doctor had suggested he open up in group, and he was going to try to. He went on to talk about having a part of himself that he never lets others see because he would rather block out the last 20 years of his life than let others know that he spent much of that in prison. Garion spoke in an abstract way about seeing traumatic and heinous things in prison, and stated that he has nightmares about those things which strongly impact him. He stated that people would look at him differently and not do business with him if they knew he had been a part of that system. Blanton talked about building himself a life in the 2.5 years since he has been out of prison, and about his brother running that into the ground. He stated that he had missed 17 years of his daughter's life and was trying to build himself into a good life before meeting her again, so he would have something of worth to offer her. Other group members and counselor challenged this, and many group members who have lost children pointed out that he has himself to give and should not try to wait until he has more because he may miss out on the chance completely. Sekai was tearful, and was able to recognize that saying he has nothing to offer is his view of himself, but has nothing to do with how his daughter would see him.   04/10/2012   12:42 PM    [facility] Group Notes: (Counselor/Nursing/MHT/Case  Management/Adjunct) 04/10/2012   @1 :15pm Value-Driven Life  Type of Therapy:  Group Therapy  Participation Level:  Active  Participation Quality:  Attentive, Sharing, Appropriate   Affect:  Blunte  Cognitive:  Appropriate  Insight:  Good  Engagement in Group: Good  Engagement in Therapy:  Good  Modes of Intervention:  Support and Exploration  Summary of Progress/Problems: Tyra explored his values of freedom and respect. He processed ways to live with personal freedom, from personal freedom through living inside the law to emotional freedom to make one's own choices and choose a lifepath. Payden was able to explore the concept of valuing self. He was the only group member who commented on this as a positive thing rather than being selfish, although he stated that he wants to value himself but cannot figure out how to.   [name] 04/10/2012 2:18 PM

## 2012-04-10 NOTE — Progress Notes (Signed)
Pt attends groups and actively participates. Pt had no complaints. Pt was offered support and encouragement. Pt is receptive to care and safety maintained on unit.

## 2012-04-10 NOTE — Tx Team (Signed)
Patient seen during during d/c planning group.  He continues to endorse anger issues, depression, anxiety, hopelessness and helplessness all rated at seven.  He denies SI/HI.  Patient agrees to talk with staff should he feels his anger becoming uncontrollable.

## 2012-04-10 NOTE — Tx Team (Signed)
Interdisciplinary Treatment Plan Update (Adult)  Date:  04/10/2012  Time Reviewed:  10:22 AM   Progress in Treatment: Attending groups:   Yes   Participating in groups:  Yes Taking medication as prescribed:  Yes Tolerating medication:  Yes Family/Significant othe contact made: Contact made with patient's sister Patient understands diagnosis:  Yes Discussing patient identified problems/goals with staff: Yes Medical problems stabilized or resolved: Yes Denies suicidal/homicidal ideation: Yes  Issues/concerns per patient self-inventory:  Other:  New problem(s) identified:  Reason for Continuation of Hospitalization: Depression Anxiety Medication Management  Interventions implemented related to continuation of hospitalization:  Medication Management; safety checks q 15 mins  Additional comments:  Estimated length of stay: 2-3 days  Discharge Plan: Home with sister or an [employer] goal(s):  Review of initial/current patient goals per problem list:    1.  Goal(s):  Eliminate SI/other thoughts of self harm  Met:  Yes  Target date: d/c  As evidenced [id number] is no longer endorsing SI/thoughts of self harm  2.  Goal (s):  Eliminate/decrease anger  Met:  No  Target date: d/c  As evidenced by:  Patient he advise he is no longer angry or anger has decreased significantly/under control  3.  Goal(s):  Reduce depression/anxiety (rated at seven today)  Met:  No  Target date: d/c  As evidenced by: Patient will rate symptoms at four or below  4.  Goal(s):  Stabilize on mediation  Met:  Yes  Target date:  d/c As evidenced by:  Patient will report medications are working Attendees: Patient:     Family:     Physician:  Orson Aloe, MD 04/10/2012 10:22 AM   Nursing:   Cato Mulligan, RN 04/10/2012 10:22 AM   CaseManager:  Juline Patch, LCSW 04/10/2012 10:22 AM   Counselor:  Angus Palms, LCSW 04/10/2012 10:22 AM   Other:  Consuello Bossier, NP 04/10/2012 10:22  AM   Other:  Reyes Ivan, LCSWA 04/10/2012  10:22 AM   Other:  Manuela Schwartz, RN 04/10/2012 10:30 AM  Other:      Scribe for Treatment Team:   [name], LCSW,  04/10/2012 10:22 AM

## 2012-04-10 NOTE — Progress Notes (Signed)
[facility] MD Progress Note  04/10/2012 8:41 PM  Diagnosis:  Axis I: Alcohol Abuse, Major Depression, Recurrent severe, Post Traumatic Stress Disorder and Substance Induced Mood Disorder  ADL's:  Intact  Sleep: Poor, has begun his usual detox night sweats.  Discussed SAMe that might be helpful for that after he gets out of the hospital  Appetite:  Good  Suicidal Ideation:  Pt denies any suicidal thoughts. Homicidal Ideation:  Denies adamantly any homicidal thoughts.  Mental Status Examination/Evaluation: Objective:  Appearance: Casual  Eye Contact::  Good  Speech:  Clear and Coherent  Volume:  Normal  Mood:   Anxiety: 5/10 on the same scale  Affect:  Congruent  Thought Process:  Coherent  Orientation:  Full  Thought Content:  WDL  Suicidal Thoughts:  No  Homicidal Thoughts:  No  Memory:  Immediate;   Good  Judgement:  Fair  Insight:  Fair  Psychomotor Activity:  Normal  Concentration:  Good  Recall:  Good  Akathisia:  No  AIMS (if indicated):     Assets:  Communication Skills Desire for Improvement Resilience  Sleep:  Number of Hours: 6.25    Vital Signs:Blood pressure 130/83, pulse 66, temperature 98 F (36.7 C), temperature source Oral, resp. rate 16, height 5\' 8"  (1.727 m), weight 80.74 kg (178 lb), SpO2 97.00%. Current Medications: Current Facility-Administered Medications  Medication Dose Route Frequency Provider Last Rate Last Dose  . acetaminophen (TYLENOL) tablet 650 mg  650 mg Oral Q4H PRN Viviann Spare, NP      . alum & mag hydroxide-simeth (MAALOX/MYLANTA) 200-200-20 MG/5ML suspension 30 mL  30 mL Oral Q4H PRN Viviann Spare, NP   30 mL at 04/08/12 2216  . carbamazepine (TEGRETOL) tablet 200 mg  200 mg Oral TID Mickie D. Adams, PA   200 mg at 04/10/12 1723  . chlorproMAZINE (THORAZINE) tablet 10 mg  10 mg Oral TID Mickie D. Adams, PA   10 mg at 04/10/12 1723  . citalopram (CELEXA) tablet 20 mg  20 mg Oral QHS Mickie D. Adams, PA   20 mg at 04/09/12 2157  .  hydrochlorothiazide (MICROZIDE) capsule 25 mg  25 mg Oral Daily Viviann Spare, NP   25 mg at 04/10/12 0749  . ibuprofen (ADVIL,MOTRIN) tablet 600 mg  600 mg Oral Q8H PRN Viviann Spare, NP      . magnesium hydroxide (MILK OF MAGNESIA) suspension 30 mL  30 mL Oral Daily PRN Viviann Spare, NP      . metoprolol (LOPRESSOR) tablet 50 mg  50 mg Oral Daily Viviann Spare, NP   50 mg at 04/10/12 0749  . nicotine polacrilex (NICORETTE) gum 2 mg  2 mg Oral PRN Viviann Spare, NP      . prazosin (MINIPRESS) capsule 2 mg  2 mg Oral QHS Viviann Spare, NP   2 mg at 04/09/12 2157  . traZODone (DESYREL) tablet 100 mg  100 mg Oral QHS,MR X 1 Mickie D. Adams, PA   100 mg at 04/09/12 2251    Lab Results:  Results for orders placed during the hospital encounter of 04/04/12 (from the past 72 hour(s))  HEMOGLOBIN A1C     Status: Normal   Collection Time   04/09/12  6:17 AM      Component Value Range Comment   Hemoglobin A1C 5.1  <5.7 (%)    Mean Plasma Glucose 100  <117 (mg/dL)     Physical Findings: AIMS:  , ,  ,  ,  CIWA:    COWS:     Treatment Plan Summary: Daily contact with patient to assess and evaluate symptoms and progress in treatment Medication management  Discussion/Plan: Most  of the time today was spent discussing how he needs to change a whole lot of things to get himself going in a direction more what he wants - celan and sober.  We discussed that it will not be easy, but will be worth the while.  I gave him a stickie that read "I am okay!" he appreciated that.  He learned that he can not return to his brother's house because his brother was out looking for heroin last night and that will not make that house safe for him.  He was directed that he will need to locate an [facility] to stay in until the disability hearing. Attend AA mtgs  Maleny Candy 04/10/2012, 8:41 PM

## 2012-04-10 NOTE — Progress Notes (Signed)
At the beginning of the shift, pt was in bed asleep.  He did not attend evening group.  He was up by 2100 and in the dayroom asking for snacks.  He reports that he enjoyed going outside today and playing basketball.  He says he was just tired after dinner and decided to lie down.  He voices no concerns/needs.  He denies SI/HI.  Safety maintained with q15 minute checks.

## 2012-04-11 MED ORDER — TRAZODONE HCL 150 MG PO TABS
250.0000 mg | ORAL_TABLET | Freq: Every day | ORAL | Status: DC
  Administered 2012-04-11 – 2012-04-13 (×3): 250 mg via ORAL
  Filled 2012-04-11 (×6): qty 1

## 2012-04-11 MED ORDER — CHLORPROMAZINE HCL 25 MG PO TABS
25.0000 mg | ORAL_TABLET | Freq: Three times a day (TID) | ORAL | Status: DC
  Administered 2012-04-11: 25 mg via ORAL
  Filled 2012-04-11 (×9): qty 1

## 2012-04-11 NOTE — Progress Notes (Addendum)
[facility] Group Notes: (Counselor/Nursing/MHT/Case Management/Adjunct) 04/11/2012   @11 :00am Recovery and Relapse Prevention  Type of Therapy:  Group Therapy  Participation Level:  Active  Participation Quality: Attentive, Sharing, Appropriate   Affect:  Blunted  Cognitive:  Appropriate  Insight:  Good  Engagement in Group: Good  Engagement in Therapy:  Good  Modes of Intervention:  Support and Exploration  Summary of Progress/Problems: Chase Moss chose a picture of a sunrise/sunset that reflects his recovery. He talked about recovery being the dawn of something new for him, and that the expanse of the horizon was to him freeing. He explored the lack of restrictions and judgements he saw in the picture, which translated into the freedom to be himself. He seemed to relate to others who talked about finding a light at the end of the tunnel.   Chase Moss 04/11/2012 12:39 PM      [facility] Group Notes:  (Counselor/Nursing/MHT/Case Management/Adjunct) 04/11/2012  1:15pm [facility] [place]   Type of Therapy:  Group Therapy  Participation Level:  Did Not Attend     Chase Moss 04/11/2012  2:28 PM

## 2012-04-11 NOTE — Discharge Planning (Signed)
Pt did attend group on this am. Pt presented with a flat affect and mood. Pt stated that " he has been going through changes and continues to have impulsive behaviors." Pt rated her depression 5, anxiety 8, hopelessness 5, and helplessness 7. Pt reported that " he doesn't want to return to the same situation." Pt verbalized that " he wants to find a place that he can afford for 16 days and that is no more than $200/wk. Pt verbalized that " he would like to find somewhere close to [employer]."  Pt also verbalized that thoughts of SI on and off and that " he has a lot of anger."No concerns voiced at this time.

## 2012-04-11 NOTE — Progress Notes (Signed)
Patient ID: Chase Moss, male   DOB: 05-01-1966, 46 y.o.   MRN: [id number] Pt. Lying in bed, supine, resp. Even, no distress noted. Staff will continue to monitor q15min for safety.

## 2012-04-11 NOTE — Progress Notes (Addendum)
Pt reports that his depression is at the min for the moment, but tends to go up and down. Pt is denying SI/HI, but still contracts for safety in the event. Pt observed interacting well with others. Pt paced the unit earlier as a exercise with another pt. Pt reports that he has been experiencing nights sweats in the middle of the night and it is not due to the room temperature. This started on his third night here, so around 4/14. A physician note has been documented by this Clinical research associate. Pt thinks it may be his blood pressure going up at night. Writer offered to have vitals taken if he awakes in the middle of the night sweating. Continued support and availability as needed has been extended to this pt. Pt safety remains with q47min checks.

## 2012-04-11 NOTE — Progress Notes (Signed)
[facility] MD Progress Note  04/11/2012 4:40 PM  Diagnosis:  Axis I: Alcohol Abuse, Major Depression, Recurrent severe, Post Traumatic Stress Disorder and Substance Induced Mood Disorder  ADL's:  Intact  Sleep: Poor, finds that Trazodone 200mg  does not help, will increase to 250 mg at HS.  Appetite:  Good  Suicidal Ideation:  Pt denies any suicidal thoughts. Homicidal Ideation:  Denies adamantly any homicidal thoughts.  Mental Status Examination/Evaluation: Objective:  Appearance: Casual  Eye Contact::  Good  Speech:  Clear and Coherent  Volume:  Normal  Mood:  5/10 on a scale of 1 is the best and 10 is the worst. Anxiety: 5/10 on the same scale  Affect:  Congruent  Thought Process:  Coherent  Orientation:  Full  Thought Content:  WDL  Suicidal Thoughts:  No  Homicidal Thoughts:  No  Memory:  Immediate;   Good  Judgement:  Fair  Insight:  Fair  Psychomotor Activity:  Normal  Concentration:  Good  Recall:  Good  Akathisia:  No  AIMS (if indicated):     Assets:  Communication Skills Desire for Improvement Resilience  Sleep:  Number of Hours: 5.25    Recovery: He is to attend AA/NA mtgs on the [address].  ROS: Neuro: no headaches, ataxia, weakness  GI: no N/V/D/cramps/constipation  MS: no weakness, muscle cramps, aches, except where he has been working out a lot more and noting sore chest muscles from doing push-ups.  Vital Signs:Blood pressure 143/87, pulse 76, temperature 97.3 F (36.3 C), temperature source Oral, resp. rate 18, height 5\' 8"  (1.727 m), weight 80.74 kg (178 lb), SpO2 98.00%. Current Medications: Current Facility-Administered Medications  Medication Dose Route Frequency Provider Last Rate Last Dose  . acetaminophen (TYLENOL) tablet 650 mg  650 mg Oral Q4H PRN Viviann Spare, NP      . alum & mag hydroxide-simeth (MAALOX/MYLANTA) 200-200-20 MG/5ML suspension 30 mL  30 mL Oral Q4H PRN Viviann Spare, NP   30 mL at 04/08/12 2216  . carbamazepine  (TEGRETOL) tablet 200 mg  200 mg Oral TID Mickie D. Adams, PA   200 mg at 04/11/12 1255  . chlorproMAZINE (THORAZINE) tablet 25 mg  25 mg Oral TID Mike Craze, MD      . citalopram (CELEXA) tablet 20 mg  20 mg Oral QHS Mickie D. Adams, PA   20 mg at 04/10/12 2203  . hydrochlorothiazide (MICROZIDE) capsule 25 mg  25 mg Oral Daily Viviann Spare, NP   25 mg at 04/11/12 0857  . ibuprofen (ADVIL,MOTRIN) tablet 600 mg  600 mg Oral Q8H PRN Viviann Spare, NP      . magnesium hydroxide (MILK OF MAGNESIA) suspension 30 mL  30 mL Oral Daily PRN Viviann Spare, NP      . metoprolol (LOPRESSOR) tablet 50 mg  50 mg Oral Daily Viviann Spare, NP   50 mg at 04/11/12 0856  . nicotine polacrilex (NICORETTE) gum 2 mg  2 mg Oral PRN Viviann Spare, NP      . prazosin (MINIPRESS) capsule 2 mg  2 mg Oral QHS Viviann Spare, NP   2 mg at 04/10/12 2202  . traZODone (DESYREL) tablet 250 mg  250 mg Oral QHS Mike Craze, MD      . DISCONTD: chlorproMAZINE (THORAZINE) tablet 10 mg  10 mg Oral TID Mickie D. Adams, PA   10 mg at 04/11/12 1255  . DISCONTD: traZODone (DESYREL) tablet 100 mg  100  mg Oral QHS,MR X 1 Mickie D. Adams, PA   100 mg at 04/10/12 2202    Lab Results:  Results for orders placed during the hospital encounter of 04/04/12 (from the past 72 hour(s))  HEMOGLOBIN A1C     Status: Normal   Collection Time   04/09/12  6:17 AM      Component Value Range Comment   Hemoglobin A1C 5.1  <5.7 (%)    Mean Plasma Glucose 100  <117 (mg/dL)     Physical Findings: AIMS:  , ,  ,  ,    CIWA:    COWS:     Treatment Plan Summary: Daily contact with patient to assess and evaluate symptoms and progress in treatment Medication management  Discussion/Plan: Most  of the time today was spent discussing how he plans to stay clean and sober.  He plans to go to a motel after discharge from here.  He needs to get better adjusted on the Trazodone.  He continues to have night sweats. His impulsivity is  still problematic, will increase Thorazine to 25 mg for that. Attend AA mtgs  Chase Moss 04/11/2012, 4:40 PM

## 2012-04-12 DIAGNOSIS — F4310 Post-traumatic stress disorder, unspecified: Secondary | ICD-10-CM

## 2012-04-12 DIAGNOSIS — R45851 Suicidal ideations: Secondary | ICD-10-CM

## 2012-04-12 MED ORDER — LISINOPRIL 5 MG PO TABS
5.0000 mg | ORAL_TABLET | Freq: Every day | ORAL | Status: DC
  Administered 2012-04-12 – 2012-04-14 (×3): 5 mg via ORAL
  Filled 2012-04-12 (×5): qty 1

## 2012-04-12 MED ORDER — CHLORPROMAZINE HCL 10 MG PO TABS
10.0000 mg | ORAL_TABLET | Freq: Two times a day (BID) | ORAL | Status: DC
  Filled 2012-04-12: qty 1

## 2012-04-12 MED ORDER — LORAZEPAM 1 MG PO TABS
2.0000 mg | ORAL_TABLET | Freq: Once | ORAL | Status: AC
  Administered 2012-04-12: 2 mg via ORAL

## 2012-04-12 MED ORDER — CHLORPROMAZINE HCL 10 MG PO TABS
10.0000 mg | ORAL_TABLET | Freq: Two times a day (BID) | ORAL | Status: DC
  Administered 2012-04-12 – 2012-04-14 (×5): 10 mg via ORAL
  Filled 2012-04-12 (×7): qty 1

## 2012-04-12 MED ORDER — CHLORPROMAZINE HCL 25 MG PO TABS
25.0000 mg | ORAL_TABLET | Freq: Every day | ORAL | Status: DC
  Administered 2012-04-12 – 2012-04-13 (×2): 25 mg via ORAL
  Filled 2012-04-12 (×4): qty 1

## 2012-04-12 MED ORDER — LORAZEPAM 1 MG PO TABS
ORAL_TABLET | ORAL | Status: AC
  Filled 2012-04-12: qty 2

## 2012-04-12 MED ORDER — CHLORPROMAZINE HCL 25 MG PO TABS
25.0000 mg | ORAL_TABLET | Freq: Every day | ORAL | Status: DC
  Filled 2012-04-12: qty 1

## 2012-04-12 NOTE — Progress Notes (Signed)
[facility] MD Progress Note  04/12/2012 12:12 PM  Diagnosis:   Axis I: Major Depression, Recurrent severe and Post Traumatic Stress Disorder Axis II: Deferred Axis III:  Past Medical History  Diagnosis Date  . Hypertension   . Depression    Subjective:  Chase Moss presents today reporting that he did not take his Thorazine this morning because it causes him to feel sluggish. He continues to endorse depression and anxiety.  He denies any suicidal or homicidal thoughts. He denies any auditory or visual hallucinations.he reports that he has no interest in stopping using marijuana, and denies that he has a problem with alcohol. He endorses improved sleep last night, but complains of a nightmare and waking with night sweats. He also reports that his blood pressure continues to be elevated.  ADL's:  Intact  Sleep: Fair  Appetite:  Good  Suicidal Ideation:  None Homicidal Ideation:  None  AEB (as evidenced by):  Mental Status Examination/Evaluation: Objective:  Appearance: Well Groomed  Eye Contact::  Good  Speech:  Clear and Coherent and rapid  Volume:  Normal  Mood:  Anxious  Affect:  Congruent  Thought Process:  Disorganized  Orientation:  Full  Thought Content:  WDL  Suicidal Thoughts:  No  Homicidal Thoughts:  No  Memory:  Remote;   Good  Judgement:  Fair  Insight:  Fair  Psychomotor Activity:  Increased  Concentration:  Good  Recall:  Good  Akathisia:  No  Handed:    AIMS (if indicated):     Assets:  Communication Skills Desire for Improvement Resilience  Sleep:  Number of Hours: 5.25    Vital Signs:Blood pressure 147/100, pulse 64, temperature 97 F (36.1 C), temperature source Oral, resp. rate 16, height 5\' 8"  (1.727 m), weight 80.74 kg (178 lb), SpO2 98.00%. Current Medications: Current Facility-Administered Medications  Medication Dose Route Frequency Provider Last Rate Last Dose  . acetaminophen (TYLENOL) tablet 650 mg  650 mg Oral Q4H PRN Viviann Spare, NP        . alum & mag hydroxide-simeth (MAALOX/MYLANTA) 200-200-20 MG/5ML suspension 30 mL  30 mL Oral Q4H PRN Viviann Spare, NP   30 mL at 04/08/12 2216  . carbamazepine (TEGRETOL) tablet 200 mg  200 mg Oral TID Mickie D. Adams, PA   200 mg at 04/12/12 2956  . chlorproMAZINE (THORAZINE) tablet 10 mg  10 mg Oral BID WC Jorje Guild, PA-C      . chlorproMAZINE (THORAZINE) tablet 25 mg  25 mg Oral QAC supper Jorje Guild, PA-C      . citalopram (CELEXA) tablet 20 mg  20 mg Oral QHS Mickie D. Adams, PA   20 mg at 04/11/12 2149  . hydrochlorothiazide (MICROZIDE) capsule 25 mg  25 mg Oral Daily Viviann Spare, NP   25 mg at 04/12/12 2130  . ibuprofen (ADVIL,MOTRIN) tablet 600 mg  600 mg Oral Q8H PRN Viviann Spare, NP      . lisinopril (PRINIVIL,ZESTRIL) tablet 5 mg  5 mg Oral Daily Jorje Guild, PA-C      . magnesium hydroxide (MILK OF MAGNESIA) suspension 30 mL  30 mL Oral Daily PRN Viviann Spare, NP      . metoprolol (LOPRESSOR) tablet 50 mg  50 mg Oral Daily Viviann Spare, NP   50 mg at 04/12/12 8657  . nicotine polacrilex (NICORETTE) gum 2 mg  2 mg Oral PRN Viviann Spare, NP      . prazosin (MINIPRESS) capsule 2 mg  2 mg Oral QHS Viviann Spare, NP   2 mg at 04/11/12 2149  . traZODone (DESYREL) tablet 250 mg  250 mg Oral QHS Mike Craze, MD   250 mg at 04/11/12 2148  . DISCONTD: chlorproMAZINE (THORAZINE) tablet 10 mg  10 mg Oral TID Mickie D. Adams, PA   10 mg at 04/11/12 1255  . DISCONTD: chlorproMAZINE (THORAZINE) tablet 25 mg  25 mg Oral TID Mike Craze, MD   25 mg at 04/11/12 1829  . DISCONTD: traZODone (DESYREL) tablet 100 mg  100 mg Oral QHS,MR X 1 Mickie D. Adams, PA   100 mg at 04/10/12 2202    Lab Results: No results found for this or any previous visit (from the past 48 hour(s)).  Physical Findings: AIMS:  , ,  ,  ,    CIWA:    COWS:     Treatment Plan Summary: Daily contact with patient to assess and evaluate symptoms and progress in treatment Medication  management  Plan: We will decrease his daytime Thorazine dose to 10 mg each morning and at lunch, and continue Thorazine at 25 mg at 5 PM. We will also add lisinopril as a third antihypertensive as his blood pressures are poorly controlled yet his pulse rate is too low to increase his Lopressor. Chase Moss 04/12/2012, 12:12 PM

## 2012-04-12 NOTE — Progress Notes (Signed)
Pt has attended the groups and interacts with his peers appropriately. Rates his depression and hopelessness at a 5 for both and states that he is having thoughts of SI on and off. Contracts for his safety Told a Comptroller that he was not suicidal. Chase Moss outside for recreation time and another Pt ran into him playing basketball. Pt states that it is a bit painful, but wants to wait to see if it will heal. Affect remains flat and mood appears depressed when he is not interacting with others. Given support and reassurance, along with praise.

## 2012-04-13 ENCOUNTER — Inpatient Hospital Stay (HOSPITAL_COMMUNITY)
Admission: AD | Admit: 2012-04-13 | Discharge: 2012-04-13 | Disposition: A | Discharge status: Home / Self Care | Payer: PRIVATE HEALTH INSURANCE | Source: Ambulatory Visit | Attending: Physician Assistant | Admitting: Physician Assistant

## 2012-04-13 DIAGNOSIS — F1020 Alcohol dependence, uncomplicated: Secondary | ICD-10-CM

## 2012-04-13 DIAGNOSIS — F329 Major depressive disorder, single episode, unspecified: Secondary | ICD-10-CM

## 2012-04-13 NOTE — Progress Notes (Signed)
Pt complaining of pain on his right sadie and asked if he could be sent over to have an X ray of ribs. Feels that they might be broken. Yesterday Pt was playing basketball and one of the pt's ran into him by accident. Pt wanted to wait to today to see if the soreness went away. Pt has been medicated for pain during the day.  Pt rates his depression at an 8 and his hopelessness at a 7. Admits to thoughts of suicide on and off. Tammy Sours was very involved in the groups today. Pt was able to speak up for himself this afternoon appropriately and was given praise for that. Given support, reassurance and praise.

## 2012-04-13 NOTE — Progress Notes (Signed)
Patient came to medication window requesting medication for pain he was experiencing from playing basketball earlier, patient was given ibuprofen for right rib cage and shoulder pain. Patient reported that he has had a good day and it felt good to go outside today. Patient currently denies si/hi/a/v hall. Safety maintained on unit, will continue to monitor.

## 2012-04-13 NOTE — Progress Notes (Signed)
Patient reported having had a good day, he went and had a chest x-ray done and is waiting for the result. Patient reports that he still has a little soreness and requested that he receive Ibuprofen with his scheduled medications tonight.  Patient has been up in the dayroom interacting appropriately with peers. Patient has been in a better mood than last night, he has been more cheerful and talkative. Patient denies si/hi/a/v hall. Safety maintained on unit, will continue to monitor.
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
## 2012-04-13 NOTE — Progress Notes (Signed)
[facility] Group Notes:  (Counselor/Nursing/MHT/Case Management/Adjunct)  04/13/2012 1315  Type of Therapy:  Group Therapy  Participation Level:  Active  Participation Quality:  Appropriate  Affect:  Appropriate  Cognitive:  Appropriate  Insight:  Good  Engagement in Group:  Good  Engagement in Therapy:  Good  Modes of Intervention:  Clarification, Problem-solving, Socialization and Support  Summary of Progress/Problems: Pt. participated in group session on supports. Pt. was asked what support means to them, who are their supports, what is the difference between unhealthy and healthy supports. Pt stated that he thought his sister was a healthy support and stated that he thought she understood what he was experiencing because she is a Engineer, civil (consulting). Pt stated that his sister does not understand why he isolates and calls him "lazy". Pt stated that support for himself means having independence.   Crosswell, Desiree 04/13/2012, 2:25 PM

## 2012-04-14 MED ORDER — CARBAMAZEPINE 200 MG PO TABS: 200.0000 mg | ORAL_TABLET | Freq: Three times a day (TID) | ORAL | Status: DC

## 2012-04-14 MED ORDER — TRAZODONE HCL 100 MG PO TABS
250.0000 mg | ORAL_TABLET | Freq: Every day | ORAL | Status: DC
  Filled 2012-04-14: qty 25

## 2012-04-14 MED ORDER — LIDOCAINE 5 % EX PTCH: 1.0000 | MEDICATED_PATCH | CUTANEOUS | Status: DC

## 2012-04-14 MED ORDER — PRAZOSIN HCL 1 MG PO CAPS
4.0000 mg | ORAL_CAPSULE | Freq: Every day | ORAL | Status: DC
  Filled 2012-04-14: qty 40

## 2012-04-14 MED ORDER — TRAZODONE HCL 100 MG PO TABS: 100.0000 mg | ORAL_TABLET | Freq: Every day | ORAL | Status: DC

## 2012-04-14 MED ORDER — METOPROLOL TARTRATE 50 MG PO TABS: 50.0000 mg | ORAL_TABLET | Freq: Every day | ORAL | Status: DC

## 2012-04-14 MED ORDER — CHLORPROMAZINE HCL 25 MG PO TABS: 50.0000 mg | ORAL_TABLET | Freq: Every day | ORAL | Status: DC

## 2012-04-14 MED ORDER — TRAZODONE HCL 100 MG PO TABS
100.0000 mg | ORAL_TABLET | Freq: Every day | ORAL | Status: DC
  Filled 2012-04-14: qty 20

## 2012-04-14 MED ORDER — CHLORPROMAZINE HCL 50 MG PO TABS
50.0000 mg | ORAL_TABLET | Freq: Every day | ORAL | Status: DC
  Filled 2012-04-14: qty 20

## 2012-04-14 MED ORDER — CHLORPROMAZINE HCL 10 MG PO TABS: 10.0000 mg | ORAL_TABLET | Freq: Two times a day (BID) | ORAL | Status: DC

## 2012-04-14 MED ORDER — HYDROCHLOROTHIAZIDE 25 MG PO TABS: 25.0000 mg | ORAL_TABLET | Freq: Every day | ORAL | Status: DC

## 2012-04-14 MED ORDER — LISINOPRIL 5 MG PO TABS: 5.0000 mg | ORAL_TABLET | Freq: Every day | ORAL | Status: DC

## 2012-04-14 MED ORDER — LIDOCAINE 5 % EX PTCH
1.0000 | MEDICATED_PATCH | CUTANEOUS | Status: DC
  Administered 2012-04-14: 1 via TRANSDERMAL
  Filled 2012-04-14 (×2): qty 1

## 2012-04-14 MED ORDER — PRAZOSIN HCL 1 MG PO CAPS
4.0000 mg | ORAL_CAPSULE | Freq: Every day | ORAL | Status: DC
  Filled 2012-04-14 (×2): qty 4

## 2012-04-14 MED ORDER — PRAZOSIN HCL 2 MG PO CAPS: 2.0000 mg | ORAL_CAPSULE | Freq: Every day | ORAL | Status: DC

## 2012-04-14 MED ORDER — CITALOPRAM HYDROBROMIDE 20 MG PO TABS: 20.0000 mg | ORAL_TABLET | Freq: Every day | ORAL | Status: DC

## 2012-04-14 MED ORDER — AMITRIPTYLINE HCL 25 MG PO TABS: 25.0000 mg | ORAL_TABLET | Freq: Every day | ORAL | Status: DC

## 2012-04-14 MED ORDER — AMITRIPTYLINE HCL 25 MG PO TABS
25.0000 mg | ORAL_TABLET | Freq: Every day | ORAL | Status: DC
  Filled 2012-04-14 (×2): qty 1

## 2012-04-14 NOTE — Progress Notes (Addendum)
Patient ID: Chase Moss, male   DOB: Jul 16, 1966, 46 y.o.   MRN: [id number] Discharge Note:   Patient discharged to sister's home in [place], picked up by brother-in-law.     Denied SI and HI.   Denied A/V hallucinations.   Denied pain.   Suicide prevention information given to patient, discussed with patient, who stated he understands and has no questions.  Patient received all his belongings, misc items, clothing, medications, phone, wallet.   Patient stated he appreciated all the staff has done to assist him.  Case Manager called family and patient does not have access to guns at their home.  Patient has been cooperative, pleasant, alert.
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
## 2012-04-14 NOTE — Progress Notes (Signed)
Writer advised by RN that patient has access to guns.  Writer spoke with patient's sister who advised to her knowledge patient has no access to guns and there are no guns in their home.  Sister husband to transport patient to their home for the evening.
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
## 2012-04-14 NOTE — Tx Team (Signed)
Interdisciplinary Treatment Plan Update (Adult)  Date:  04/14/2012  Time Reviewed:  10:38 AM   Progress in Treatment: Attending groups:   Yes   Participating in groups:  Yes Taking medication as prescribed:  Yes Tolerating medication:  Yes Family/Significant othe contact made:  Patient understands diagnosis:  Yes Discussing patient identified problems/goals with staff: Yes Medical problems stabilized or resolved: Yes Denies suicidal/homicidal ideation:Yes Issues/concerns per patient self-inventory:  Other:  New problem(s) identified:  Reason for Continuation of Hospitalization: Anxiety Depression Medication stabilization  Interventions implemented related to continuation of hospitalization:  Medication Management; safety checks q 15 mins  Additional comments:  Estimated length of stay: 2-3 days  Discharge Plan: Outpatient follow up.  Patient reports he will live in a hotel  New goal(s):  Review of initial/current patient goals per problem list:   1.  Goal(s):  Eliminate SI/Other thoughts of self-harm  Met:  Yes  Target date:  D/c  As evidenced by:  Patient is not endorsing SI at this time  2.  Goal (s):  Reduce depression (rated at three)  Met:  Yes  Target date: d/c  As evidenced by:  Patient will rate depression at four or below  3.  Goal(s):  Reduce anxiety  Met:  No  Target date:  d/c  As evidenced by:  Patient will rate anxiety at four or below  4.  Goal(s):  Stabilize on medications  Met:  No  Target date: d/c  As evidenced by:  Patient will report being stable on medications  Attendees: Patient:      Family:     Physician:  Orson Aloe, MD 04/14/2012 10:38 AM   Nursing:   Quintella Reichert, RN 04/14/2012 10:38 AM   CaseManager:  Juline Patch, LCSW 04/14/2012 10:38 AM   Counselor:  Angus Palms, LCSW 04/14/2012 10:38 AM   Other:  Neill Loft, RN 04/14/2012 10:38 AM   Other:  Reyes Ivan, LCSWA 04/14/2012  10:38 AM   Other:       Other:      Scribe for Treatment Team:   [name], LCSW,  04/14/2012 10:38 AM

## 2012-04-14 NOTE — Discharge Instructions (Signed)
Attend 90 meetings in 90 days. Get trusted sponsor from the advise of others or from whomever in meetings seems to make sense, has a proven track record, will hold you responsible for your sobriety, and both expects and insists on total abstinence.  Work the steps HONESTLY with the trusted sponsor. Get obsessed with your recovery by often reminding yourself of how DEADLY this dredged horrible disease of addiction JUST IS. Focus the first month on speaker meetings where you specifically look at how your life has been wrecked by drugs/alcohol and how your life has been similar to that of the speakers.   

## 2012-04-14 NOTE — Progress Notes (Signed)
[facility] Case Management Discharge Plan:  Will you be returning to the same living situation after discharge: No. At discharge, do you have transportation home?:Yes,  Patient reports being to provide his own transportation Do you have the ability to pay for your medications:No.Patient will be assisted with indigent medications Interagency Information:     Release of information consent forms completed and in the chart;  Patient's signature needed at discharge.  Patient to Follow up at:  Follow-up Information    Follow up with [facility] on 04/15/2012. (Please go to [facility] on Tuesday, April 15, 2012 or any weekday between 8:00 a.m. - 3:00 p.m. for medication management as needed)    Contact information:   [address] [place], [place]  [address]  [phone]         Patient denies SI/HI:   Yes,  Patient no logner endorsing SI    Safety Planning and Suicide Prevention discussed:  Yes,  Reviewed during discharge planning group  Barrier to discharge identified:Yes,  Lack of permanent housing and limited support system  Summary and Recommendations:  Patient encouraged to follow up with outpatient service   [name] 04/14/2012, 3:09 PM
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
## 2012-04-14 NOTE — Progress Notes (Signed)
Patient seen during d/c planning group.  He reports being better and denies SI/HI.  He rates depression at three, anxiety at five, and hopelessness/helplessness at three.  He advised of having vivid nightmares since MD increased one of his medications.  He currently rates pain at eight/nine as a result of injury that occurred during recreational activity.

## 2012-04-14 NOTE — Progress Notes (Signed)
Patient's self inventory sheet, has fair sleep, good appetite, normal energy level, good attention span.  Rated depression #3, hopelessness #5, anxiety #5.  Denied SI.  Still having nightmares and sweats real bad.   Pain goal 1, worst pain 8.  Will remember to take meds after discharge.   "I have nightmares of people trying to kill me and witnessing them kill others."  Does have discharge plans.   Will have problems taking meds after discharge.  "I forget to take my meds at proper times and sometimes at all."

## 2012-04-15 NOTE — Discharge Summary (Signed)
I agree with this D/C Summary.  

## 2012-04-18 NOTE — Progress Notes (Signed)
Patient Discharge Instructions:  Psychiatric Admission Assessment Note Provided,  04/16/2012 After Visit Summary (AVS) Provided,  04/16/2012 Face Sheet Provided, 04/16/2012 Faxed/Sent to the Next Level Care provider:  04/16/2012 Provided Suicide Risk Assessment - Discharge Assessment 04/16/2012  Faxed to [facility] @ [phone]  Wandra Scot, 04/18/2012, 10:08 AM

## 2012-05-13 ENCOUNTER — Emergency Department (HOSPITAL_COMMUNITY)
Admission: EM | Admit: 2012-05-13 | Discharge: 2012-05-14 | Disposition: A | Discharge status: Home / Self Care | Payer: Medicaid Other | Attending: Emergency Medicine | Admitting: Emergency Medicine

## 2012-05-13 ENCOUNTER — Encounter (HOSPITAL_COMMUNITY): Payer: Self-pay

## 2012-05-13 ENCOUNTER — Emergency Department (HOSPITAL_COMMUNITY): Payer: Medicaid Other

## 2012-05-13 DIAGNOSIS — M25569 Pain in unspecified knee: Secondary | ICD-10-CM | POA: Insufficient documentation

## 2012-05-13 DIAGNOSIS — F3289 Other specified depressive episodes: Secondary | ICD-10-CM | POA: Insufficient documentation

## 2012-05-13 DIAGNOSIS — H5500 Unspecified nystagmus: Secondary | ICD-10-CM | POA: Insufficient documentation

## 2012-05-13 DIAGNOSIS — IMO0002 Reserved for concepts with insufficient information to code with codable children: Secondary | ICD-10-CM | POA: Insufficient documentation

## 2012-05-13 DIAGNOSIS — I10 Essential (primary) hypertension: Secondary | ICD-10-CM | POA: Insufficient documentation

## 2012-05-13 DIAGNOSIS — M25562 Pain in left knee: Secondary | ICD-10-CM

## 2012-05-13 DIAGNOSIS — Z79899 Other long term (current) drug therapy: Secondary | ICD-10-CM | POA: Insufficient documentation

## 2012-05-13 DIAGNOSIS — M79609 Pain in unspecified limb: Secondary | ICD-10-CM | POA: Insufficient documentation

## 2012-05-13 DIAGNOSIS — F329 Major depressive disorder, single episode, unspecified: Secondary | ICD-10-CM | POA: Insufficient documentation

## 2012-05-13 DIAGNOSIS — T07XXXA Unspecified multiple injuries, initial encounter: Secondary | ICD-10-CM

## 2012-05-13 DIAGNOSIS — M25559 Pain in unspecified hip: Secondary | ICD-10-CM | POA: Insufficient documentation

## 2012-05-13 HISTORY — DX: Post-traumatic stress disorder, unspecified: F43.10
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
## 2012-05-14 ENCOUNTER — Emergency Department (HOSPITAL_COMMUNITY): Payer: Medicaid Other

## 2012-05-14 LAB — COMPREHENSIVE METABOLIC PANEL
ALT: 21 U/L (ref 0–53)
AST: 21 U/L (ref 0–37)
Albumin: 4.1 g/dL (ref 3.5–5.2)
Chloride: 97 mEq/L (ref 96–112)
Creatinine, Ser: 1.34 mg/dL (ref 0.50–1.35)
Potassium: 3.7 mEq/L (ref 3.5–5.1)
Sodium: 134 mEq/L — ABNORMAL LOW (ref 135–145)
Total Bilirubin: 0.7 mg/dL (ref 0.3–1.2)

## 2012-05-14 LAB — DIFFERENTIAL
Basophils Relative: 0 % (ref 0–1)
Lymphocytes Relative: 53 % — ABNORMAL HIGH (ref 12–46)
Monocytes Relative: 5 % (ref 3–12)
Neutro Abs: 5.5 10*3/uL (ref 1.7–7.7)

## 2012-05-14 LAB — URINALYSIS, ROUTINE W REFLEX MICROSCOPIC
Glucose, UA: NEGATIVE mg/dL
Ketones, ur: NEGATIVE mg/dL
Leukocytes, UA: NEGATIVE
pH: 5 (ref 5.0–8.0)

## 2012-05-14 LAB — CBC
HCT: 48.4 % (ref 39.0–52.0)
Hemoglobin: 17.1 g/dL — ABNORMAL HIGH (ref 13.0–17.0)
MCHC: 35.3 g/dL (ref 30.0–36.0)
WBC: 13.4 10*3/uL — ABNORMAL HIGH (ref 4.0–10.5)

## 2012-05-14 LAB — SAMPLE TO BLOOD BANK

## 2012-05-14 LAB — PATHOLOGIST SMEAR REVIEW

## 2012-05-14 MED ORDER — OXYCODONE-ACETAMINOPHEN 5-325 MG PO TABS
1.0000 | ORAL_TABLET | Freq: Once | ORAL | Status: AC
  Administered 2012-05-14: 1 via ORAL
  Filled 2012-05-14: qty 1

## 2012-05-14 MED ORDER — BACITRACIN ZINC 500 UNIT/GM EX OINT
TOPICAL_OINTMENT | CUTANEOUS | Status: AC
  Filled 2012-05-14: qty 2.7
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
## 2012-05-14 NOTE — ED Notes (Signed)
[employer] at bedside 
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
## 2012-05-14 NOTE — ED Notes (Signed)
Pt ambulate out without distress 

## 2014-03-12 ENCOUNTER — Encounter (HOSPITAL_COMMUNITY): Payer: Self-pay | Admitting: *Deleted

## 2014-03-12 ENCOUNTER — Emergency Department (HOSPITAL_COMMUNITY)
Admission: EM | Admit: 2014-03-12 | Discharge: 2014-03-12 | Disposition: A | Discharge status: Other Acute Inpatient Hospital | Payer: MEDICAID | Attending: Emergency Medicine | Admitting: Emergency Medicine

## 2014-03-12 ENCOUNTER — Encounter (HOSPITAL_COMMUNITY): Payer: Self-pay | Admitting: Emergency Medicine

## 2014-03-12 ENCOUNTER — Inpatient Hospital Stay (HOSPITAL_COMMUNITY)
Admission: AD | Admit: 2014-03-12 | Discharge: 2014-03-19 | DRG: 885 | Disposition: A | Discharge status: Home / Self Care | Payer: MEDICAID | Attending: Psychiatry | Admitting: Psychiatry

## 2014-03-12 DIAGNOSIS — F1020 Alcohol dependence, uncomplicated: Secondary | ICD-10-CM | POA: Diagnosis present

## 2014-03-12 DIAGNOSIS — Z79899 Other long term (current) drug therapy: Secondary | ICD-10-CM | POA: Insufficient documentation

## 2014-03-12 DIAGNOSIS — I10 Essential (primary) hypertension: Secondary | ICD-10-CM | POA: Diagnosis present

## 2014-03-12 DIAGNOSIS — F32A Depression, unspecified: Secondary | ICD-10-CM

## 2014-03-12 DIAGNOSIS — F1210 Cannabis abuse, uncomplicated: Secondary | ICD-10-CM | POA: Diagnosis present

## 2014-03-12 DIAGNOSIS — F22 Delusional disorders: Secondary | ICD-10-CM | POA: Diagnosis present

## 2014-03-12 DIAGNOSIS — Z5987 Material hardship due to limited financial resources, not elsewhere classified: Secondary | ICD-10-CM

## 2014-03-12 DIAGNOSIS — F1994 Other psychoactive substance use, unspecified with psychoactive substance-induced mood disorder: Secondary | ICD-10-CM | POA: Diagnosis present

## 2014-03-12 DIAGNOSIS — F411 Generalized anxiety disorder: Secondary | ICD-10-CM | POA: Diagnosis present

## 2014-03-12 DIAGNOSIS — F329 Major depressive disorder, single episode, unspecified: Secondary | ICD-10-CM | POA: Insufficient documentation

## 2014-03-12 DIAGNOSIS — F4310 Post-traumatic stress disorder, unspecified: Secondary | ICD-10-CM | POA: Diagnosis present

## 2014-03-12 DIAGNOSIS — E785 Hyperlipidemia, unspecified: Secondary | ICD-10-CM | POA: Diagnosis present

## 2014-03-12 DIAGNOSIS — R45851 Suicidal ideations: Secondary | ICD-10-CM

## 2014-03-12 DIAGNOSIS — Z8249 Family history of ischemic heart disease and other diseases of the circulatory system: Secondary | ICD-10-CM

## 2014-03-12 DIAGNOSIS — F17200 Nicotine dependence, unspecified, uncomplicated: Secondary | ICD-10-CM | POA: Diagnosis present

## 2014-03-12 DIAGNOSIS — F3160 Bipolar disorder, current episode mixed, unspecified: Principal | ICD-10-CM | POA: Diagnosis present

## 2014-03-12 DIAGNOSIS — Z598 Other problems related to housing and economic circumstances: Secondary | ICD-10-CM

## 2014-03-12 DIAGNOSIS — F3289 Other specified depressive episodes: Secondary | ICD-10-CM | POA: Insufficient documentation

## 2014-03-12 DIAGNOSIS — F332 Major depressive disorder, recurrent severe without psychotic features: Secondary | ICD-10-CM

## 2014-03-12 DIAGNOSIS — F39 Unspecified mood [affective] disorder: Secondary | ICD-10-CM | POA: Insufficient documentation

## 2014-03-12 DIAGNOSIS — G4700 Insomnia, unspecified: Secondary | ICD-10-CM | POA: Diagnosis present

## 2014-03-12 LAB — SALICYLATE LEVEL

## 2014-03-12 LAB — CBC
HEMATOCRIT: 45.8 % (ref 39.0–52.0)
HEMOGLOBIN: 15.7 g/dL (ref 13.0–17.0)
MCH: 30.5 pg (ref 26.0–34.0)
MCHC: 34.3 g/dL (ref 30.0–36.0)
MCV: 89.1 fL (ref 78.0–100.0)
Platelets: 203 10*3/uL (ref 150–400)
RBC: 5.14 MIL/uL (ref 4.22–5.81)
RDW: 12.9 % (ref 11.5–15.5)
WBC: 9 10*3/uL (ref 4.0–10.5)

## 2014-03-12 LAB — RAPID URINE DRUG SCREEN, HOSP PERFORMED
AMPHETAMINES: NOT DETECTED
BARBITURATES: NOT DETECTED
Benzodiazepines: NOT DETECTED
Cocaine: NOT DETECTED
Opiates: NOT DETECTED
Tetrahydrocannabinol: POSITIVE — AB

## 2014-03-12 LAB — COMPREHENSIVE METABOLIC PANEL
ALBUMIN: 3.9 g/dL (ref 3.5–5.2)
ALK PHOS: 75 U/L (ref 39–117)
ALT: 26 U/L (ref 0–53)
AST: 20 U/L (ref 0–37)
BUN: 16 mg/dL (ref 6–23)
CO2: 27 mEq/L (ref 19–32)
CREATININE: 1.08 mg/dL (ref 0.50–1.35)
Calcium: 9.4 mg/dL (ref 8.4–10.5)
Chloride: 100 mEq/L (ref 96–112)
GFR calc non Af Amer: 79 mL/min — ABNORMAL LOW (ref >90)
GLUCOSE: 98 mg/dL (ref 70–99)
POTASSIUM: 4.5 meq/L (ref 3.7–5.3)
Sodium: 138 mEq/L (ref 137–147)
TOTAL PROTEIN: 6.9 g/dL (ref 6.0–8.3)
Total Bilirubin: 0.3 mg/dL (ref 0.3–1.2)

## 2014-03-12 LAB — ACETAMINOPHEN LEVEL

## 2014-03-12 LAB — ETHANOL

## 2014-03-12 MED ORDER — PRAZOSIN HCL 2 MG PO CAPS
2.0000 mg | ORAL_CAPSULE | Freq: Every day | ORAL | Status: DC
  Administered 2014-03-12: 2 mg via ORAL
  Filled 2014-03-12: qty 3

## 2014-03-12 MED ORDER — MAGNESIUM HYDROXIDE 400 MG/5ML PO SUSP: 30.0000 mL | Freq: Every day | ORAL | Status: DC | PRN

## 2014-03-12 MED ORDER — SIMVASTATIN 20 MG PO TABS
20.0000 mg | ORAL_TABLET | Freq: Every day | ORAL | Status: DC
  Administered 2014-03-13 – 2014-03-18 (×6): 20 mg via ORAL
  Filled 2014-03-12 (×7): qty 1

## 2014-03-12 MED ORDER — ACETAMINOPHEN 325 MG PO TABS: 650.0000 mg | ORAL_TABLET | Freq: Four times a day (QID) | ORAL | Status: DC | PRN

## 2014-03-12 MED ORDER — METOPROLOL TARTRATE 25 MG PO TABS
50.0000 mg | ORAL_TABLET | Freq: Every day | ORAL | Status: DC
  Administered 2014-03-12: 50 mg via ORAL
  Filled 2014-03-12: qty 2

## 2014-03-12 MED ORDER — PRAZOSIN HCL 2 MG PO CAPS
2.0000 mg | ORAL_CAPSULE | Freq: Every day | ORAL | Status: DC
  Administered 2014-03-12 – 2014-03-15 (×4): 2 mg via ORAL
  Filled 2014-03-12 (×2): qty 1
  Filled 2014-03-12: qty 2
  Filled 2014-03-12 (×4): qty 1

## 2014-03-12 MED ORDER — QUETIAPINE FUMARATE 200 MG PO TABS
200.0000 mg | ORAL_TABLET | Freq: Every day | ORAL | Status: DC
  Filled 2014-03-12 (×3): qty 1

## 2014-03-12 MED ORDER — METOPROLOL TARTRATE 50 MG PO TABS
50.0000 mg | ORAL_TABLET | Freq: Every day | ORAL | Status: DC
  Administered 2014-03-13 – 2014-03-19 (×7): 50 mg via ORAL
  Filled 2014-03-12 (×8): qty 1

## 2014-03-12 MED ORDER — LISINOPRIL 5 MG PO TABS
5.0000 mg | ORAL_TABLET | Freq: Every day | ORAL | Status: DC
  Administered 2014-03-12: 5 mg via ORAL
  Filled 2014-03-12: qty 1

## 2014-03-12 MED ORDER — ALUM & MAG HYDROXIDE-SIMETH 200-200-20 MG/5ML PO SUSP: 30.0000 mL | ORAL | Status: DC | PRN

## 2014-03-12 NOTE — Tx Team (Signed)
Initial Interdisciplinary Treatment Plan  PATIENT STRENGTHS: (choose at least two) Active sense of humor Average or above average intelligence Capable of independent living Communication skills Motivation for treatment/growth Physical Health Supportive family/friends  PATIENT STRESSORS: Medication change or noncompliance PTSD   PROBLEM LIST: Problem List/Patient Goals Date to be addressed Date deferred Reason deferred Estimated date of resolution  Depression 03/12/2014     Suicide Risk 03/12/2014                                                DISCHARGE CRITERIA:  Improved stabilization in mood, thinking, and/or behavior Need for constant or close observation no longer present Verbal commitment to aftercare and medication compliance  PRELIMINARY DISCHARGE PLAN: Outpatient therapy Return to previous living arrangement  PATIENT/FAMIILY INVOLVEMENT: This treatment plan has been presented to and reviewed with the patient, Chase Moss, and/or family member.  The patient and family have been given the opportunity to ask questions and make suggestions.  Chase Moss, Chase Moss 03/12/2014, 11:15 PM
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
## 2014-03-12 NOTE — ED Notes (Signed)
[facility] pending medical [facility]. [facility]

## 2014-03-12 NOTE — ED Provider Notes (Addendum)
CSN: [id number]     Arrival date & time 03/12/14  1715 History   First MD Initiated Contact with Patient 03/12/14 1754     Chief Complaint  Patient presents with  . Medical Clearance     (Consider location/radiation/quality/duration/timing/severity/associated sxs/prior Treatment) The history is provided by the patient.  pt with hx PTSD, depression, from [facility].  Pt states recently out of seroquel, and stopped depakote, and states in past 2 weeks feeling worse. Feels very depressed w suicidal thoughts. Denies attempt at self harm, states compliant w other meds. Symptoms persistent, constant. States physical health at baseline, denies any c/o.    Past Medical History  Diagnosis Date  . Hypertension   . Depression   . PTSD (post-traumatic stress disorder)    History reviewed. No pertinent past surgical history. Family History  Problem Relation Age of Onset  . Heart attack Other    History  Substance Use Topics  . Smoking status: Current Every Day Smoker -- 0.50 packs/day for 30 years    Types: Cigarettes  . Smokeless tobacco: Current User  . Alcohol Use: 1.2 oz/week    2 Cans of beer per week     Comment: 2 40s and 2 beers    Review of Systems  Constitutional: Negative for fever.  HENT: Negative for sore throat.   Eyes: Negative for redness.  Respiratory: Negative for shortness of breath.   Cardiovascular: Negative for chest pain.  Gastrointestinal: Negative for vomiting, abdominal pain and diarrhea.  Genitourinary: Negative for flank pain.  Musculoskeletal: Negative for back pain and neck pain.  Skin: Negative for rash.  Neurological: Negative for headaches.  Hematological: Does not bruise/bleed easily.  Psychiatric/Behavioral: Positive for suicidal ideas and dysphoric mood.      Allergies  Review of patient's allergies indicates no known allergies.  Home Medications   Current Outpatient Rx  Name  Route  Sig  Dispense  Refill  . ENALAPRIL MALEATE PO   Oral  Take by mouth.         Marland Kitchen LISINOPRIL PO   Oral   Take by mouth.         . prazosin (MINIPRESS) 2 MG capsule   Oral   Take 2-6 mg by mouth at bedtime.         Marland Kitchen PRESCRIPTION MEDICATION   Oral   Take 40 mg by mouth daily. Cholesterol medication.         . QUEtiapine (SEROQUEL) 100 MG tablet   Oral   Take 200 mg by mouth at bedtime.         Marland Kitchen EXPIRED: amitriptyline (ELAVIL) 25 MG tablet   Oral   Take 1 tablet (25 mg total) by mouth at bedtime. For pain management, depression, and insomnia.   30 tablet   0   . EXPIRED: chlorproMAZINE (THORAZINE) 10 MG tablet   Oral   Take 1 tablet (10 mg total) by mouth 2 (two) times daily with breakfast and lunch. For anxiety   60 tablet   0   . EXPIRED: citalopram (CELEXA) 20 MG tablet   Oral   Take 1 tablet (20 mg total) by mouth at bedtime. For depression.   30 tablet   0   . EXPIRED: lisinopril (PRINIVIL,ZESTRIL) 5 MG tablet   Oral   Take 1 tablet (5 mg total) by mouth daily. For control of high blood pressure\   30 tablet   0   . EXPIRED: metoprolol (LOPRESSOR) 50 MG tablet   Oral  Take 1 tablet (50 mg total) by mouth daily. For control of high blood pressure   30 tablet   0    BP 144/90  Pulse 51  Temp(Src) 98.1 F (36.7 C) (Oral)  Resp 18  SpO2 97% Physical Exam  Nursing note and vitals reviewed. Constitutional: He is oriented to person, place, and time. He appears well-developed and well-nourished. No distress.  HENT:  Head: Atraumatic.  Mouth/Throat: Oropharynx is clear and moist.  Eyes: Conjunctivae are normal.  Neck: Neck supple. No tracheal deviation present.  Cardiovascular: Normal rate, regular rhythm, normal heart sounds and intact distal pulses.   Pulmonary/Chest: Effort normal and breath sounds normal. No accessory muscle usage. No respiratory distress.  Abdominal: Soft. Bowel sounds are normal. He exhibits no distension. There is no tenderness.  Musculoskeletal: Normal range of motion. He  exhibits no edema and no tenderness.  Neurological: He is alert and oriented to person, place, and time.  Skin: Skin is warm and dry. He is not diaphoretic.  Psychiatric:  Depressed mood. +SI.      ED Course  Procedures (including critical care time)  Results for orders placed during the hospital encounter of 03/12/14  ACETAMINOPHEN LEVEL      Result Value Ref Range   Acetaminophen (Tylenol), Serum <15.0  10 - 30 ug/mL  CBC      Result Value Ref Range   WBC 9.0  4.0 - 10.5 K/uL   RBC 5.14  4.22 - 5.81 MIL/uL   Hemoglobin 15.7  13.0 - 17.0 g/dL   HCT 28.445.8  13.239.0 - 44.052.0 %   MCV 89.1  78.0 - 100.0 fL   MCH 30.5  26.0 - 34.0 pg   MCHC 34.3  30.0 - 36.0 g/dL   RDW 10.212.9  72.511.5 - 36.615.5 %   Platelets 203  150 - 400 K/uL  COMPREHENSIVE METABOLIC PANEL      Result Value Ref Range   Sodium 138  137 - 147 mEq/L   Potassium 4.5  3.7 - 5.3 mEq/L   Chloride 100  96 - 112 mEq/L   CO2 27  19 - 32 mEq/L   Glucose, Bld 98  70 - 99 mg/dL   BUN 16  6 - 23 mg/dL   Creatinine, Ser 4.401.08  0.50 - 1.35 mg/dL   Calcium 9.4  8.4 - 34.710.5 mg/dL   Total Protein 6.9  6.0 - 8.3 g/dL   Albumin 3.9  3.5 - 5.2 g/dL   AST 20  0 - 37 U/L   ALT 26  0 - 53 U/L   Alkaline Phosphatase 75  39 - 117 U/L   Total Bilirubin 0.3  0.3 - 1.2 mg/dL   GFR calc non Af Amer 79 (*) >90 mL/min   GFR calc Af Amer >90  >90 mL/min  ETHANOL      Result Value Ref Range   Alcohol, Ethyl (B) <11  0 - 11 mg/dL  SALICYLATE LEVEL      Result Value Ref Range   Salicylate Lvl <2.0 (*) 2.8 - 20.0 mg/dL       MDM   Labs.  [facility]  Reviewed nursing notes and prior charts for additional history.   Pts bp high, hx same, has taken none of his normal bp meds yet today.  pts normal bp meds ordered.  [facility]urse called [facility], states has been accepted there, has bed.      Suzi RootsKevin E Britanie Harshman, MD 03/12/14 1946

## 2014-03-12 NOTE — ED Notes (Signed)
Per pt, [facility], for med regulation and PTSD-was on depakote but has side effects so he stopped 2 weeks ago, here to have BP evaluated and [facility]

## 2014-03-12 NOTE — Discharge Instructions (Signed)
Transfer to [facility].   Continue patients normal bp meds.

## 2014-03-13 DIAGNOSIS — R45851 Suicidal ideations: Secondary | ICD-10-CM

## 2014-03-13 DIAGNOSIS — F1010 Alcohol abuse, uncomplicated: Secondary | ICD-10-CM

## 2014-03-13 MED ORDER — RISPERIDONE 0.5 MG PO TABS
0.5000 mg | ORAL_TABLET | Freq: Every day | ORAL | Status: DC
  Administered 2014-03-13: 0.5 mg via ORAL
  Filled 2014-03-13 (×4): qty 1
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
## 2014-03-13 NOTE — Progress Notes (Signed)
Adult Psychoeducational Group Note  Date:  03/13/2014 Time:  6:34 PM  Group Topic/Focus:  love languages  Participation Level:  Active  Participation Quality:  Appropriate  Affect:  Appropriate  Cognitive:  Appropriate  Insight: Appropriate  Engagement in Group:   Engaged   Modes of Intervention:  Discussion, Education and Support  Additional Comments:  Chase Moss was active and attentive in group. He participated in the discussion and shared his thoughts. He was respectful of all other members and staff.   Chase Moss, Sybilla Malhotra L 03/13/2014, 6:34 PM

## 2014-03-13 NOTE — [facility] Group Notes (Signed)
[facility] Group Notes:  (Nursing/MHT/Case Management/Adjunct)  Date:  03/13/2014  Time:  11:07 AM  Type of Therapy:  Nurse Education  Participation Level:  Did Not Attend  Participation Quality:  Inattentive  Affect:  Depressed  Cognitive:  Lacking  Insight:  None  Engagement in Group:  None  Modes of Intervention:  Discussion  Summary of Progress/Problems:Pt did not attend group  Rodman KeyWebb, Tatyana Biber [facility] 03/13/2014, 11:07 AM

## 2014-03-13 NOTE — Progress Notes (Signed)
D Tammy SoursGreg is seen OOB UAL on the 500 hall today..he tolerates this well. He is pleasant, cooperative and anxious...chewing on one-half of a white plastic straw ( as he experiences nicotine withdrawal)!!!   A HE takes his meds as scheduled. HE does not complete his morning self inventory and says he " probably won't complete tomorrow"s.   R POC maintaiened and safety in place.

## 2014-03-13 NOTE — [facility] Group Notes (Signed)
[facility] LCSW Group Therapy  03/13/2014 1:15 PM  Type of Therapy:  Group Therapy  Participation Level:  Minimal  Participation Quality:  Sharing  Affect:  Anxious  Cognitive:  Alert  Insight:  Limited  Engagement in Therapy:  Limited  Modes of Intervention:  Clarification, Discussion, Exploration, Rapport Building, Socialization and Support  Summary of Progress/Problems: The main focus of today's process group was for the patient to identify ways in which they have in the past sabotaged their own recovery. Motivational Interviewing was utilized to ask the group members what they get out of their substance use, and what reasons they may have for wanting to change. The Stages of Change were explained using a handout, and patients identified where they currently are with regard to stages of change.  Patient was in and out of group room and changing seats to point of distraction. Patient's comments were quiet but tangential. When engaged pt shared he feels he is in maintenance stage and needs to comply with medication but self sabotaging behaviors of worry and fear get in his way.   Clide DalesHarrill, Catherine Campbell

## 2014-03-14 DIAGNOSIS — F411 Generalized anxiety disorder: Secondary | ICD-10-CM

## 2014-03-14 DIAGNOSIS — F4310 Post-traumatic stress disorder, unspecified: Secondary | ICD-10-CM

## 2014-03-14 DIAGNOSIS — F332 Major depressive disorder, recurrent severe without psychotic features: Secondary | ICD-10-CM

## 2014-03-14 MED ORDER — OLANZAPINE 5 MG PO TBDP
5.0000 mg | ORAL_TABLET | Freq: Two times a day (BID) | ORAL | Status: DC | PRN
  Administered 2014-03-14 – 2014-03-15 (×4): 5 mg via ORAL
  Filled 2014-03-14 (×4): qty 1

## 2014-03-14 MED ORDER — BENZTROPINE MESYLATE 1 MG PO TABS
1.0000 mg | ORAL_TABLET | Freq: Two times a day (BID) | ORAL | Status: DC
  Administered 2014-03-14 – 2014-03-16 (×4): 1 mg via ORAL
  Filled 2014-03-14 (×9): qty 1

## 2014-03-14 MED ORDER — BENZTROPINE MESYLATE 1 MG PO TABS
ORAL_TABLET | ORAL | Status: AC
  Administered 2014-03-14: 1 mg via ORAL
  Filled 2014-03-14: qty 1

## 2014-03-14 MED ORDER — RISPERIDONE 0.5 MG PO TABS
0.5000 mg | ORAL_TABLET | Freq: Two times a day (BID) | ORAL | Status: DC
  Administered 2014-03-14 – 2014-03-15 (×2): 0.5 mg via ORAL
  Filled 2014-03-14 (×7): qty 1

## 2014-03-14 NOTE — Progress Notes (Signed)
Chase Moss is seen out in the milieu. He remains preoccupied. He is anxious, nervous and easily agiated.    A MD changed his risperdal to  bid ( from HS) and added zyprexa 5 mg po bid prn.  Pt does not attend groups, saying he doesn't like to feel closed in, he doesn't like  To speak in groups and he doesn't benefit from the group experience.  He is encouraged to complete his AM self inventory and on it he  Wrote he cont to have " off and on " SI, he rated his depression and hopelessness "7/5" and he  Did not addresshis DC plan . When this nurse asks, he says " I don't know".   R RN spoke with MD and got order for cogentin PO and this was given to pt. Pt cont UAl without diff.

## 2014-03-14 NOTE — [facility] Group Notes (Signed)
[facility] LCSW Group Therapy  03/14/2014  1: 15 pM  Type of Therapy:  Group Therapy  Participation Level:  Minimal  Participation Quality:  Inattentive  Affect:  Resistant and restless  Cognitive:  Alert  Insight:  Limited  Engagement in Therapy:  None  Modes of Intervention:  Clarification, Discussion, Limit-setting, Rapport Building, Socialization and Support  Summary of Progress/Problems: The main focus of today's process group was to identify the patient's current support system and explore supports that can be put in place. We began group by each patient sharing something they are looking forward to this year.  An emphasis was placed on using counselor, doctor, therapy groups, 12-step groups, and problem-specific support groups to expand supports. There was also discussion about what constitutes a healthy support versus an unhealthy support. [name] was in and out of group room and at times attentive yet resistant to discussion of supports   Margues Filippini, Julious Payeratherine Campbell

## 2014-03-14 NOTE — Progress Notes (Addendum)
Chase Moss  Cont to have a difficult time, ie he c/o agitation this afternoon and  He was given zypreza ( at 1452). He told this nurse that he kept looking at the window at the end of the hall...and he was getting more and more agitated.Marland Kitchen. He stated  He kept thinking about the  window  At the end of the hall.    A He was given zyprexa 5 mg per MD order. He completed his self inventory  And on it he rated his depression " "7/5" and

## 2014-03-14 NOTE — Progress Notes (Signed)
Adult Psychoeducational Group Note  Date:  03/14/2014 Time:  08:00pm Group Topic/Focus:  Wrap-Up Group:   The focus of this group is to help patients review their daily goal of treatment and discuss progress on daily workbooks.  Participation Level:  Did Not Attend  Participation Quality:    Affect:    Cognitive:   Insight:   Engagement in Group:   Modes of Intervention:   Additional Comments:  Pt came in as group was ending. No participation.  Shelly BombardGarner, Torion Hulgan D 03/14/2014, 9:34 PM

## 2014-03-14 NOTE — Progress Notes (Signed)
Late entry on 03-14-14 for the group on 03-13-14 @ 1030  Psychoeducational Group Note  Date: 03/14/2014 Time:  1015  Group Topic/Focus:  Identifying Needs:   The focus of this group is to help patients identify their personal needs that have been historically problematic and identify healthy behaviors to address their needs.  Participation Level: Did not attend Dione HousekeeperJudge, Ronesha Heenan A

## 2014-03-14 NOTE — Progress Notes (Signed)
Patient ID: Chase Moss, male   DOB: 03/28/1966, 48 y.o.   MRN: [id number] D)  Pt was out on the hall and in the dayroom, attended group.  Was seen on camera after group going to Red Room where he apparently had smoked pot, as the room smelled strongly of smoke shortly after, when an admission was brought to the room to finish the adm. Process on the computer.  When confronted, pt admitted he had been smoking, stated he had flushed what was left, and his lighter.  Room was searched, as was pt, no other evidence was found.  Was walking around in his room with the lights out, watching the females across the hall.  Bathroom light was left on and door was closed, he was irritated, wanted door left open.  Door across the hall to females was also closed. A)  Will continue to monitor for safety, remind of rules, encourage to focus on reason for being here and goals.  Continue POC R)  Safety maintained

## 2014-03-14 NOTE — Progress Notes (Addendum)
[facility] MD Progress Note  03/14/2014 1:28 PM Chase Moss  MRN:  [id number] Subjective:  Chase Moss states he is doing ok, feels kinda "blah". I feel like Im wasting my time, the medications you guys are trying me on aren't working. My sister has the same kind of problems I have, and Vistaril, Depakote, and gabapentin didn't not work for her. I don't understand why they took me off of what worked before [employer].idiopathic thrombocytopenic purpura worked well for me. He reports poor sleeping habits, because he has nightmares and sweating. He took my Seroquel and Minipress, he cut my dose of Minipress and that was helping me sleep. He reports feeling hyper and angry. He is paranoid and stressed out because he is not on medication. He came in here for help and feels like he is not getting the help he needs. He is also agitated about being stripped searched last night, and he was not the only one smoking on the courtyard and in the conference. He states we need to search other people and visitors are bringing in drugs.  Diagnosis:   DSM5: Schizophrenia Disorders:   Obsessive-Compulsive Disorders:   Trauma-Stressor Disorders:  Posttraumatic Stress Disorder (309.81) Substance/Addictive Disorders:  Alcohol Intoxication with Use Disorder - Severe (F10.229) and Cannabis Use Disorder - Moderate 9304.30) Depressive Disorders:  Major Depressive Disorder - Severe (296.23) Total Time spent with patient: 30 minutes  Axis I: Generalized Anxiety Disorder, Major Depression, Recurrent severe and Post Traumatic Stress Disorder Axis II: Deferred Axis IV: economic problems, housing problems, other psychosocial or environmental problems, problems related to legal system/crime, problems related to social environment, problems with access to health care services and problems with primary support group Axis V: 41-50 serious symptoms  ADL's:  Intact  Sleep: Poor  Appetite:  Fair  Suicidal Ideation:  Plan:  Denies Intent:   Denies Means:  Denies Homicidal Ideation:  Plan:  Denies Intent:  Denies Means:  Denies AEB (as evidenced by):  Psychiatric Specialty Exam: Physical Exam  Constitutional: He appears well-developed.  Eyes: Pupils are equal, round, and reactive to light.  Neck: Normal range of motion.  Respiratory: Effort normal.  GI: Soft.  Skin: Skin is warm.    Review of Systems  Psychiatric/Behavioral: Positive for depression, suicidal ideas and substance abuse. The patient is nervous/anxious.   All other systems reviewed and are negative.    Blood pressure 136/87, pulse 66, temperature 98.3 F (36.8 C), temperature source Oral, resp. rate 20, height 5' 10"  (1.778 m), weight 84.823 kg (187 lb), SpO2 98.00%.Body mass index is 26.83 kg/(m^2).  General Appearance: Casual and Fairly Groomed  Eye Contact::  Good  Speech:  Clear and Coherent and Normal Rate  Volume:  Normal  Mood:  Angry, Anxious and Depressed  Affect:  Depressed and Flat  Thought Process:  Disorganized and Irrelevant  Orientation:  Full (Time, Place, and Person)  Thought Content:  Rumination  Suicidal Thoughts:  No  Homicidal Thoughts:  No  Memory:  Immediate;   Good Recent;   Good Remote;   Good  Judgement:  Good  Insight:  Good  Psychomotor Activity:  Normal  Concentration:  Fair  Recall:  [place] of Knowledge:Good  Language: Good  Akathisia:  NA  Handed:  Right  AIMS (if indicated):     Assets:  Communication Skills Desire for Improvement Financial Resources/Insurance Vocational/Educational  Sleep:  Number of Hours: 4.5   Musculoskeletal: Strength & Muscle Tone: within normal limits Gait & Station: normal Patient leans:  N/A  Current Medications: Current Facility-Administered Medications  Medication Dose Route Frequency Provider Last Rate Last Dose  . acetaminophen (TYLENOL) tablet 650 mg  650 mg Oral Q6H PRN Lurena Nida, NP      . alum & mag hydroxide-simeth (MAALOX/MYLANTA) 200-200-20 MG/5ML  suspension 30 mL  30 mL Oral Q4H PRN Lurena Nida, NP      . magnesium hydroxide (MILK OF MAGNESIA) suspension 30 mL  30 mL Oral Daily PRN Lurena Nida, NP      . metoprolol (LOPRESSOR) tablet 50 mg  50 mg Oral Daily Lurena Nida, NP   50 mg at 03/14/14 2778  . prazosin (MINIPRESS) capsule 2 mg  2 mg Oral QHS Lurena Nida, NP   2 mg at 03/13/14 2157  . risperiDONE (RISPERDAL) tablet 0.5 mg  0.5 mg Oral QHS Waldon Merl, MD   0.5 mg at 03/13/14 2157  . simvastatin (ZOCOR) tablet 20 mg  20 mg Oral q1800 Lurena Nida, NP   20 mg at 03/13/14 2423    Lab Results:  Results for orders placed during the hospital encounter of 03/12/14 (from the past 48 hour(s))  ACETAMINOPHEN LEVEL     Status: None   Collection Time    03/12/14  5:53 PM      Result Value Ref Range   Acetaminophen (Tylenol), Serum <15.0  10 - 30 ug/mL   Comment:            THERAPEUTIC CONCENTRATIONS VARY     SIGNIFICANTLY. A RANGE OF 10-30     ug/mL MAY BE AN EFFECTIVE     CONCENTRATION FOR MANY PATIENTS.     HOWEVER, SOME ARE BEST TREATED     AT CONCENTRATIONS OUTSIDE THIS     RANGE.     ACETAMINOPHEN CONCENTRATIONS     >150 ug/mL AT 4 HOURS AFTER     INGESTION AND >50 ug/mL AT 12     HOURS AFTER INGESTION ARE     OFTEN ASSOCIATED WITH TOXIC     REACTIONS.  CBC     Status: None   Collection Time    03/12/14  5:53 PM      Result Value Ref Range   WBC 9.0  4.0 - 10.5 K/uL   RBC 5.14  4.22 - 5.81 MIL/uL   Hemoglobin 15.7  13.0 - 17.0 g/dL   HCT 45.8  39.0 - 52.0 %   MCV 89.1  78.0 - 100.0 fL   MCH 30.5  26.0 - 34.0 pg   MCHC 34.3  30.0 - 36.0 g/dL   RDW 12.9  11.5 - 15.5 %   Platelets 203  150 - 400 K/uL  COMPREHENSIVE METABOLIC PANEL     Status: Abnormal   Collection Time    03/12/14  5:53 PM      Result Value Ref Range   Sodium 138  137 - 147 mEq/L   Potassium 4.5  3.7 - 5.3 mEq/L   Chloride 100  96 - 112 mEq/L   CO2 27  19 - 32 mEq/L   Glucose, Bld 98  70 - 99 mg/dL   BUN 16  6 - 23 mg/dL    Creatinine, Ser 1.08  0.50 - 1.35 mg/dL   Calcium 9.4  8.4 - 10.5 mg/dL   Total Protein 6.9  6.0 - 8.3 g/dL   Albumin 3.9  3.5 - 5.2 g/dL   AST 20  0 - 37 U/L   ALT 26  0 - 53 U/L   Alkaline Phosphatase 75  39 - 117 U/L   Total Bilirubin 0.3  0.3 - 1.2 mg/dL   GFR calc non Af Amer 79 (*) >90 mL/min   GFR calc Af Amer >90  >90 mL/min   Comment: (NOTE)     The eGFR has been calculated using the CKD EPI equation.     This calculation has not been validated in all clinical situations.     eGFR's persistently <90 mL/min signify possible Chronic Kidney     Disease.  ETHANOL     Status: None   Collection Time    03/12/14  5:53 PM      Result Value Ref Range   Alcohol, Ethyl (B) <11  0 - 11 mg/dL   Comment:            LOWEST DETECTABLE LIMIT FOR     SERUM ALCOHOL IS 11 mg/dL     FOR MEDICAL PURPOSES ONLY  SALICYLATE LEVEL     Status: Abnormal   Collection Time    03/12/14  5:53 PM      Result Value Ref Range   Salicylate Lvl <1.0 (*) 2.8 - 20.0 mg/dL  URINE RAPID DRUG SCREEN (HOSP PERFORMED)     Status: Abnormal   Collection Time    03/12/14  7:33 PM      Result Value Ref Range   Opiates NONE DETECTED  NONE DETECTED   Cocaine NONE DETECTED  NONE DETECTED   Benzodiazepines NONE DETECTED  NONE DETECTED   Amphetamines NONE DETECTED  NONE DETECTED   Tetrahydrocannabinol POSITIVE (*) NONE DETECTED   Barbiturates NONE DETECTED  NONE DETECTED   Comment:            DRUG SCREEN FOR MEDICAL PURPOSES     ONLY.  IF CONFIRMATION IS NEEDED     FOR ANY PURPOSE, NOTIFY LAB     WITHIN 5 DAYS.                LOWEST DETECTABLE LIMITS     FOR URINE DRUG SCREEN     Drug Class       Cutoff (ng/mL)     Amphetamine      1000     Barbiturate      200     Benzodiazepine   071     Tricyclics       219     Opiates          300     Cocaine          300     THC              50    Physical Findings: AIMS: Facial and Oral Movements Muscles of Facial Expression: None, normal Lips and Perioral  Area: None, normal Jaw: None, normal Tongue: None, normal,Extremity Movements Upper (arms, wrists, hands, fingers): None, normal Lower (legs, knees, ankles, toes): None, normal, Trunk Movements Neck, shoulders, hips: None, normal, Overall Severity Severity of abnormal movements (highest score from questions above): None, normal Incapacitation due to abnormal movements: None, normal Patient's awareness of abnormal movements (rate only patient's report): No Awareness, Dental Status Current problems with teeth and/or dentures?: No Does patient usually wear dentures?: No  CIWA:    COWS:     Treatment Plan Summary: Daily contact with patient to assess and evaluate symptoms and progress in treatment Medication management  Plan: Plan: Review of chart, vital signs, medications, and notes.  1-Admit for crisis management and stabilization. Estimated length of stay 5-7 days past his current stay of 1  2-Individual and group therapy encouraged  3-Medication management for depression and anxiety to reduce current symptoms to base line and improve the patient's overall level of functioning: Medications reviewed with the patient and Trazodone added for sleep issues and Vistaril 25 mg every six hours PRN anxiety  4-Coping skills for depression, substance abuse, anger issues, and anxiety developing--  5-Continue crisis stabilization and management  6-Address health issues--monitoring vital signs, stable  7-Treatment plan in progress to prevent relapse of depression, angry outbursts, and anxiety  8-Psychosocial education regarding relapse prevention and self-care  9-Health care follow up as needed for any health concerns  10-Call for consult with hospitalist for additional specialty patient services as needed. 11- Will continue Risperdal 0.50m 1 tablet po BID. Zyprexa 551m1 tablet po BID prn for agitation and mood.      Medical Decision Making Problem Points:  Established problem, worsening (2),  Review of last therapy session (1) and Review of psycho-social stressors (1) Data Points:  Discuss tests with performing physician (1) Review or order clinical lab tests (1) Review of medication regiment & side effects (2) Review of new medications or change in dosage (2)  I certify that inpatient services furnished can reasonably be expected to improve the patient's condition.   STPriscille Loveless FNP-BC 03/14/2014, 1:28 PM

## 2014-03-14 NOTE — Progress Notes (Signed)
Psychoeducational Group Note  Date:  03/14/2014 Time:  1015  Group Topic/Focus:  Making Healthy Choices:   The focus of this group is to help patients identify negative/unhealthy choices they were using prior to admission and identify positive/healthier coping strategies to replace them upon discharge.  Participation Level:  Did not attend Elias Bordner A 03/14/2014  

## 2014-03-14 NOTE — Progress Notes (Signed)
Psychoeducational Group Note   Date: 03/14/2014 Time: 0930 Group Topic/Focus:  Gratefulness:  The focus of this group is to help patients identify what two things they are most grateful for in their lives. What helps ground them and to center them on their work to their recovery.  Participation Level:  Did not attend Keeva Reisen A   

## 2014-03-14 NOTE — Progress Notes (Signed)
Adult Psychoeducational Group Note  Date:  03/14/2014 Time:  3:32 PM  Group Topic/Focus:  Coping Skills  Participation Level:  Did Not Attend  Chase Moss, Chase Moss 03/14/2014, 3:32 PM

## 2014-03-15 DIAGNOSIS — F1994 Other psychoactive substance use, unspecified with psychoactive substance-induced mood disorder: Secondary | ICD-10-CM

## 2014-03-15 DIAGNOSIS — F1910 Other psychoactive substance abuse, uncomplicated: Secondary | ICD-10-CM

## 2014-03-15 MED ORDER — RISPERIDONE 1 MG PO TABS
1.0000 mg | ORAL_TABLET | Freq: Two times a day (BID) | ORAL | Status: DC
  Administered 2014-03-15 – 2014-03-16 (×2): 1 mg via ORAL
  Filled 2014-03-15 (×6): qty 1

## 2014-03-15 NOTE — [facility] Group Notes (Signed)
[facility] LCSW Group Therapy          Overcoming Obstacles       1:15 -2:30        03/15/2014   3:49 PM     Type of Therapy:  Group Therapy  Participation Level:  Appropriate  Participation Quality:  Appropriate  Affect:  Appropriate, Alert  Cognitive:  Attentive Appropriate  Insight: Developing/Improving Engaged  Engagement in Therapy: Developing/Imprvoing Engaged  Modes of Intervention:  Discussion Exploration  Education Rapport BuildingProblem-Solving Support  Summary of Progress/Problems:  The main focus of today's group was overcoming obstacles.  He shared the obstacle he has to overcome is being heard.  He states it is difficult for him to get people to take time to listen to him.  Patient shared providers want him to listen to then but they will not take the time to hear his concerns.  Patient able to identify appropriate coping skills.   Wynn BankerHodnett, Chase Moss 03/15/2014   3:49 PM

## 2014-03-15 NOTE — Progress Notes (Signed)
Pt reports he is feeling ok this evening.  He says he has suicidal thoughts off/on, but can contract for safety.  He denies HI/AV.  He says he has been going to groups.  He makes his needs known to staff.  He was started on Risperdal and says he will give it a try.  He is unsure when he will be discharged or what his plans are.  Support and encouragement offered.  Safety maintained with q15 minute checks.

## 2014-03-15 NOTE — Progress Notes (Signed)
Adult Psychoeducational Group Note  Date:  03/15/2014 Time:  10:30 PM  Group Topic/Focus:  Wrap-Up Group:   The focus of this group is to help patients review their daily goal of treatment and discuss progress on daily workbooks.  Participation Level:  Active  Participation Quality:  Appropriate  Affect:  Appropriate  Cognitive:  Appropriate  Insight: Appropriate and Good  Engagement in Group:  Engaged  Modes of Intervention:  Support  Additional Comments:Pt stated that he made a couple new friends which is good for him because that's not usually what he does.   Keirsten Matuska 03/15/2014, 10:30 PM

## 2014-03-15 NOTE — Progress Notes (Signed)
Patient ID: Chase Moss, male   DOB: 10/18/1966, 48 y.o.   MRN: [id number]  Patient has expressed concern about risperidone and breast development and after brief discussion about the same patient agree to continue to take risperidone until further discussion or meeting with this provider.   Chase Moss,JANARDHAHA R. 03/15/2014 5:42 PM
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
## 2014-03-15 NOTE — Progress Notes (Signed)
Patient ID: Chase DowGregory Knezevic, male   DOB: 12/07/1966, 48 y.o.   MRN: [id number] D)  Has been up and about on the hall this evening, interacting appropriately with peers in dayroom but seems to be watching tv more than interacting.  He is rather irritable, decided not to attend group and was in his room, "not sure group helps much".  Came to med window afterward for hs meds, went back to dayroom for snack and tv, before going to bed. A)  Will continue to monitor for safety, continue POC R)  Safety maintained.

## 2014-03-15 NOTE — Progress Notes (Addendum)
Patient ID: Chase DowGregory Birchall, male   DOB: 03/19/1966, 48 y.o.   MRN: [id number] D- Patient reports he slept fair and his appetite is improving.  His  ability tio pay attention is improving.  He rates his depression at 7/10 and his hopelessness at 4/10.  He has been having thoughts of self harm.  He is expressing frustration with medication changes and doesn't feel current meds are addressing his needs.  He is still having a lot of nightmares at night.  He also is feeling agitated,  A- supported patient and brought his concerns to treatment team. R- Patient decided to wait to take prn for agitation and was able to attend group with social worker.

## 2014-03-16 MED ORDER — CHLORPROMAZINE HCL 50 MG PO TABS
50.0000 mg | ORAL_TABLET | Freq: Every day | ORAL | Status: DC
  Administered 2014-03-16: 50 mg via ORAL
  Filled 2014-03-16 (×3): qty 1

## 2014-03-16 MED ORDER — PRAZOSIN HCL 2 MG PO CAPS
4.0000 mg | ORAL_CAPSULE | Freq: Every day | ORAL | Status: DC
  Administered 2014-03-16 – 2014-03-18 (×3): 4 mg via ORAL
  Filled 2014-03-16 (×4): qty 2

## 2014-03-16 MED ORDER — OXCARBAZEPINE 300 MG PO TABS
300.0000 mg | ORAL_TABLET | Freq: Two times a day (BID) | ORAL | Status: DC
  Administered 2014-03-16 – 2014-03-17 (×2): 300 mg via ORAL
  Filled 2014-03-16 (×6): qty 1

## 2014-03-16 MED ORDER — BENZTROPINE MESYLATE 0.5 MG PO TABS
0.5000 mg | ORAL_TABLET | Freq: Two times a day (BID) | ORAL | Status: DC
  Administered 2014-03-16 – 2014-03-19 (×6): 0.5 mg via ORAL
  Filled 2014-03-16 (×9): qty 1

## 2014-03-16 NOTE — Progress Notes (Signed)
Patient refused risperdal at 1700.  Stated he will discuss his medications with MD tomorrow.  Gave patient printout of medication information on zocor, risperdal, trileptal, cogentin.
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
## 2014-03-16 NOTE — Progress Notes (Signed)
The focus of this group is to educate the patient on the purpose and policies of crisis stabilization and provide a format to answer questions about their admission.  The group details unit policies and expectations of patients while admitted.Patient did not attend.  He was very drowsy this am and returned to bed.

## 2014-03-16 NOTE — Progress Notes (Signed)
Patient ID: Chase DowGregory Knoth, male   DOB: 09/30/1966, 48 y.o.   MRN: [id number] D- Patient reports he slept the best he has slept in a long time.  He did not havae nightmares.  He rated his sleep fair on his self inventory because he work up sweating two times but was able to go back to sleep.  He says his appetite is improving and his energy level is low. Stated "I still feel tired even though i slept well".  A- supported patient.  R- patient plans to go back to sleep until groups start.

## 2014-03-16 NOTE — Progress Notes (Signed)
Adult Psychoeducational Group Note  Date:  03/16/2014 Time:  8:00PM Group Topic/Focus:  Wrap-Up Group:   The focus of this group is to help patients review their daily goal of treatment and discuss progress on daily workbooks.  Participation Level:  Active  Participation Quality:  Appropriate and Attentive  Affect:  Appropriate  Cognitive:  Alert and Appropriate  Insight: Appropriate  Engagement in Group:  Engaged  Modes of Intervention:  Discussion  Additional Comments:  Pt. Was attentive and appropriate during tonight's group discussion. Pt. Was able to discuss that he is going to start taking better care of hisself. Pt shared that he is always putting other needs and wants before taking care of hisself. Pt shared that he is going to start telling people no and not feel guilty about it.   Bing PlumeScott, Anahy Esh D 03/16/2014, 8:56 PM

## 2014-03-16 NOTE — Progress Notes (Signed)
Recreation Therapy Notes  Animal-Assisted Activity/Therapy (AAA/T) Program Checklist/Progress Notes Patient Eligibility Criteria Checklist & Daily Group note for Rec Tx Intervention  Date: 03.24.2015 Time: 2:45pm Location: [address] Dayroom    AAA/T Program Assumption of Risk Form signed by Patient/ or Parent Legal Guardian yes  Patient is free of allergies or sever asthma yes  Patient reports no fear of animals yes  Patient reports no history of cruelty to animals yes   Patient understands his/her participation is voluntary yes  Patient washes hands before animal contact yes  Patient washes hands after animal contact yes  Behavioral Response: Engaged, Appropriate   Education: Hand Washing, Appropriate Animal Interaction   Education Outcome: Acknowledges understanding  Clinical Observations/Feedback: Patient actively engaged in session, petting therapy dog appropriately and observing peer interaction with therapy dog.   Naara Kelty L Velma Agnes, LRT/CTRS  Amylah Will L 03/16/2014 4:53 PM 

## 2014-03-17 MED ORDER — HYDROXYZINE HCL 25 MG PO TABS
25.0000 mg | ORAL_TABLET | Freq: Four times a day (QID) | ORAL | Status: DC | PRN
  Administered 2014-03-17: 25 mg via ORAL
  Filled 2014-03-17: qty 1

## 2014-03-17 MED ORDER — CHLORPROMAZINE HCL 25 MG PO TABS
25.0000 mg | ORAL_TABLET | Freq: Every day | ORAL | Status: DC
  Administered 2014-03-17 – 2014-03-18 (×2): 25 mg via ORAL
  Filled 2014-03-17 (×3): qty 1

## 2014-03-17 MED ORDER — OXCARBAZEPINE 300 MG PO TABS
600.0000 mg | ORAL_TABLET | Freq: Every day | ORAL | Status: DC
  Administered 2014-03-17: 600 mg via ORAL
  Filled 2014-03-17 (×3): qty 2

## 2014-03-17 MED ORDER — OXCARBAZEPINE 300 MG PO TABS
300.0000 mg | ORAL_TABLET | Freq: Every day | ORAL | Status: DC
  Administered 2014-03-18: 300 mg via ORAL
  Filled 2014-03-17 (×3): qty 1

## 2014-03-17 NOTE — Progress Notes (Signed)
D: Patient appropriate and cooperative with staff. Patient has depressed affect and mood; much brighter as the day progresses. He reported on the self inventory sheet that he's sleeping fair, good appetite, low energy level, and improving ability to pay attention. Patient rates depression and feelings of hopelessness "5". He's attending most groups and compliant with medication regimen.  A: Support and encouragement provided to patient. Scheduled medications administered per MD orders. Maintain Q15 minute checks for safety.  R: Patient receptive. Denies SI/HI and AVH. Patient remains safe on the unit.

## 2014-03-17 NOTE — Tx Team (Signed)
Interdisciplinary Treatment Plan Update   Date Reviewed:  03/17/2014  Time Reviewed:  9:46 AM  Progress in Treatment:   Attending groups: Yes Participating in groups: Yes Taking medication as prescribed: Yes  Tolerating medication: Yes Family/Significant other contact made:  No, message left on sister's voice mail. Patient understands diagnosis: Yes  Discussing patient identified problems/goals with staff: Yes Medical problems stabilized or resolved: Yes Denies suicidal/homicidal ideation: Yes Patient has not harmed self or others: Yes  For review of initial/current patient goals, please see plan of care.  Estimated Length of Stay: 2-3 days  Reasons for Continued Hospitalization:  Anxiety Depression Medication stabilization  New Problems/Goals identified:    Discharge Plan or Barriers:   Home with outpatient follow up to be determined  Additional Comments:   Attendees:  Patient:  03/17/2014 9:46 AM   Signature: Mervyn GayJ. Jonnalagadda, MD 03/17/2014 9:46 AM  Signature:   03/17/2014 9:46 AM  Signature:   Verne SpurrNeil Mashburn, PA  03/17/2014 9:46 AM  Signature   Kingsley PlanEleanor Nicklas, PA Student 03/17/2014 9:46 AM  Signature:  03/17/2014 9:46 AM  Signature:  Juline PatchQuylle Arin Vanosdol, LCSW 03/17/2014 9:46 AM  Signature:  Reyes Ivanhelsea Horton, LCSW 03/17/2014 9:46 AM  Signature:  Leisa LenzValerie Enoch, Care Coordinator [facility] 03/17/2014 9:46 AM  Signature:  Harold Barbanonecia Byrd, RN  03/17/2014 9:46 AM  Signature:  03/17/2014  9:46 AM  Signature:   Onnie BoerJennifer Clark, RN [facility] 03/17/2014  9:46 AM  Signature:  03/17/2014  9:46 AM    Scribe for Treatment Team:   Juline PatchQuylle Kermit Arnette,  03/17/2014 9:46 AM

## 2014-03-17 NOTE — [facility] Group Notes (Signed)
[facility] LCSW Group Therapy  Emotional Regulation 1:15 - 2: 30 PM        03/17/2014     Type of Therapy:  Group Therapy  Participation Level:  Appropriate  Participation Quality:  Appropriate  Affect:  Appropriate  Cognitive:  Attentive Appropriate  Insight:  Developing/Improving Engaged  Engagement in Therapy:  Developing/Improving Engaged  Modes of Intervention:  Discussion Exploration Problem-Solving Supportive  Summary of Progress/Problems:  Group topic was emotional regulations.  Patient participated in the discussion and was able to identify an emotion that needed to regulated.  Patient shared the emotion he has to deal with is frustration.  He stated he has been here for five days and does not feel that he is getting any better.  Patient stated he continues to talk with MD about medication concerns but has not be able to get medications that work for him.  Wynn BankerHodnett, Georganna Maxson Hairston 03/17/2014

## 2014-03-17 NOTE — [facility] Group Notes (Signed)
[facility]   03/17/2014 9:57 AM  Participation Quality:  Did not attend group.  Chase Moss, Chase Moss
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
## 2014-03-18 MED ORDER — OXCARBAZEPINE 300 MG PO TABS
600.0000 mg | ORAL_TABLET | Freq: Two times a day (BID) | ORAL | Status: DC
  Administered 2014-03-18 – 2014-03-19 (×2): 600 mg via ORAL
  Filled 2014-03-18 (×4): qty 2

## 2014-03-18 MED ORDER — HYDROXYZINE HCL 50 MG PO TABS
50.0000 mg | ORAL_TABLET | Freq: Four times a day (QID) | ORAL | Status: DC | PRN
Start: 2014-03-18 — End: 2014-03-19
  Administered 2014-03-18 – 2014-03-19 (×2): 50 mg via ORAL
  Filled 2014-03-18 (×2): qty 1

## 2014-03-18 NOTE — Progress Notes (Signed)
Adult Psychoeducational Group Note  Date:  03/18/2014 Time:  12:13 AM  Group Topic/Focus:  Wrap-Up Group:   The focus of this group is to help patients review their daily goal of treatment and discuss progress on daily workbooks.  Participation Level:  Active  Participation Quality:  Appropriate  Affect:  Appropriate  Cognitive:  Appropriate  Insight: Appropriate  Engagement in Group:  Engaged  Modes of Intervention:  Discussion  Additional Comments:  The patient expressed that he sleep all day and did not attend any groups.  Octavio Mannshigpen, Blayde Bacigalupi Lee 03/18/2014, 12:13 AM

## 2014-03-18 NOTE — Progress Notes (Signed)
Adult Psychoeducational Group Note  Date:  03/18/2014 Time:  10:00am  Group Topic/Focus:  Making Healthy Choices:   The focus of this group is to help patients identify negative/unhealthy choices they were using prior to admission and identify positive/healthier coping strategies to replace them upon discharge.  Participation Level:    Participation Quality:    Affect:   Cognitive:    Insight:   Engagement in Group:    Modes of Intervention:    Additional Comments: pt did not attend group.   Shelly BombardGarner, Miles Borkowski D 03/18/2014, 11:00 PM
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
## 2014-03-18 NOTE — Progress Notes (Signed)
Patient ID: Chase DowGregory Brasington, male   DOB: 12/31/1965, 48 y.o.   MRN: [id number] He was in bed most of AM stated that the medications was making him sleepy(see new orders). He has gone to groups and has been interacting with peers this afternoon. Has not requested any PRN medication.

## 2014-03-18 NOTE — [facility] Suicide Risk Assessment (Signed)
[facility] INPATIENT:  Family/Significant Other Suicide Prevention Education  Suicide Prevention Education:  Contact Attempts; [name], Sister - [phone]; has been identified by the patient as the family member/significant other with whom the patient will be residing, and identified as the person(s) who will aid the patient in the event of a mental health crisis.  With written consent from the patient, two attempts were made to provide suicide prevention education, prior to and/or following the patient's discharge.  We were unsuccessful in providing suicide prevention education.  A suicide education pamphlet was given to the patient to share with family/significant other.  Date and time of first attempt: 03/17/14 - sister returned call but Clinical research associatewriter not available Date and time of second attempt: 03/18/14 message left on voicemail.  Chase Moss, Chase Moss 03/18/2014, 2:53 PM
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
## 2014-03-18 NOTE — Progress Notes (Signed)
Recreation Therapy Notes   Animal-Assisted Activity/Therapy (AAA/T) Program Checklist/Progress Notes Patient Eligibility Criteria Checklist & Daily Group note for Rec Tx Intervention  Date: 03.26.2015 Time: 2:45pm [address]    AAA/T Program Assumption of Risk Form signed by Patient/ or Parent Legal Guardian yes  Patient is free of allergies or sever asthma yes  Patient reports no fear of animals yes  Patient reports no history of cruelty to animals yes   Patient understands his/her participation is voluntary yes  Patient washes hands before animal contact yes  Patient washes hands after animal contact yes  Behavioral Response: Appropriate   Education: Hand Washing, Appropriate Animal Interaction   Education Outcome: Acknowledges understanding   Clinical Observations/Feedback: Patient interacted appropriately with dog team, LRT and peers during session. Patient shared stories with group about pets he has had in the past.   Jearl Klinefelterenise L Quamel Fitzmaurice, LRT/CTRS  Jearl KlinefelterBlanchfield, Reesa Gotschall L 03/18/2014 4:45 PM
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
## 2014-03-19 DIAGNOSIS — F3160 Bipolar disorder, current episode mixed, unspecified: Principal | ICD-10-CM

## 2014-03-19 MED ORDER — BENZTROPINE MESYLATE 0.5 MG PO TABS: 0.5000 mg | ORAL_TABLET | Freq: Two times a day (BID) | ORAL | Status: DC

## 2014-03-19 MED ORDER — METOPROLOL TARTRATE 50 MG PO TABS: 50.0000 mg | ORAL_TABLET | Freq: Every day | ORAL | Status: DC

## 2014-03-19 MED ORDER — OXCARBAZEPINE 600 MG PO TABS: 600.0000 mg | ORAL_TABLET | Freq: Two times a day (BID) | ORAL | Status: DC

## 2014-03-19 MED ORDER — PRAZOSIN HCL 2 MG PO CAPS: 4.0000 mg | ORAL_CAPSULE | Freq: Every day | ORAL | Status: DC

## 2014-03-19 MED ORDER — PRAVASTATIN SODIUM 40 MG PO TABS: 40.0000 mg | ORAL_TABLET | Freq: Every day | ORAL | Status: DC

## 2014-03-19 MED ORDER — CHLORPROMAZINE HCL 25 MG PO TABS: 25.0000 mg | ORAL_TABLET | Freq: Every day | ORAL | Status: DC

## 2014-03-19 NOTE — [facility] Group Notes (Signed)
[facility]   03/19/2014 10:24 AM  Participation Quality:  Did not attend.  Patient reports medications not working.   Raye Sorrowoble, Roiza Wiedel N
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
## 2014-03-19 NOTE — Progress Notes (Signed)
[facility] Discharge [facility] :  Will you be returning to the same living situation after discharge: Yes,  returning home At discharge, do you have transportation home?:Yes,  patient has a friend who will come and get him Do you have the ability to pay for your medications:Yes,  no barriers  Release of information consent forms completed and in the chart;  Patient's signature needed at discharge.  Patient to Follow up at: Follow-up Information   Follow up with Dr. Montez Mor[facility]arter - [facility] On 04/05/2014. (Monday, April 22, 2014 at 12:00)    Contact information:   [address] [place]   [address]  [phone]      Follow [facility] with Leonard Schwartzachel Miller - [facility] On 03/22/2014. (You are scheduled with Leonard Schwartzachel Miller on Monday, March 22, 2014 at 8:30 [facility])    Contact information:   [address] [place]   [address]  [phone]      Patient denies SI/HI:   Yes,  no reports    Safety Planning and Suicide Prevention discussed:  Yes,  see SI note.  Raye SorrowCoble, Lakeia Bradshaw N 03/19/2014, 12:11 PM

## 2014-03-19 NOTE — [facility] Suicide Risk Assessment (Signed)
   Demographic Factors:  Male, Adolescent or young adult, Caucasian, Low socioeconomic status, Living alone and Unemployed  Total Time spent with patient: 30 minutes  Psychiatric Specialty Exam: Physical Exam  ROS  Blood pressure 114/69, pulse 68, temperature 97.3 F (36.3 C), temperature source Oral, resp. rate 18, height 5\' 10"  (1.778 m), weight 84.823 kg (187 lb), SpO2 98.00%.Body mass index is 26.83 kg/(m^2).  General Appearance: Casual  Eye Contact::  Good  Speech:  Clear and Coherent  Volume:  Normal  Mood:  Anxious  Affect:  Appropriate and Congruent  Thought Process:  Coherent and Goal Directed  Orientation:  Full (Time, Place, and Person)  Thought Content:  WDL  Suicidal Thoughts:  No  Homicidal Thoughts:  No  Memory:  Immediate;   Good  Judgement:  Intact  Insight:  Good  Psychomotor Activity:  Normal  Concentration:  Good  Recall:  Good  Fund of Knowledge:Good  Language: Good  Akathisia:  NA  Handed:  Right  AIMS (if indicated):     Assets:  Communication Skills Desire for Improvement Financial Resources/Insurance Housing Leisure Time Physical Health Resilience Social Support Talents/Skills Transportation  Sleep:  Number of Hours: 3.5    Musculoskeletal: Strength & Muscle Tone: within normal limits Gait & Station: normal Patient leans: N/A   Mental Status Per Nursing Assessment::   On Admission:  Suicidal ideation indicated by patient  Current Mental Status by Physician: NA  Loss Factors: Financial problems/change in socioeconomic status  Historical Factors: Prior suicide attempts, Family history of mental illness or substance abuse and Impulsivity  Risk Reduction Factors:   Sense of responsibility to family, Religious beliefs about death, Positive social support, Positive therapeutic relationship and Positive coping skills or problem solving skills  Continued Clinical Symptoms:  Bipolar Disorder:   Mixed State Unstable or Poor  Therapeutic Relationship Previous Psychiatric Diagnoses and Treatments  Cognitive Features That Contribute To Risk:  Polarized thinking    Suicide Risk:  Minimal: No identifiable suicidal ideation.  Patients presenting with no risk factors but with morbid ruminations; may be classified as minimal risk based on the severity of the depressive symptoms  Discharge Diagnoses:   AXIS I:  Bipolar, mixed AXIS II:  Deferred AXIS III:   Past Medical History  Diagnosis Date  . Hypertension   . Depression   . PTSD (post-traumatic stress disorder)    AXIS IV:  other psychosocial or environmental problems, problems related to social environment and problems with primary support group AXIS V:  61-70 mild symptoms  Plan Of Care/Follow-up recommendations:  Activity:  As tolerated Diet:  Regular  Is patient on multiple antipsychotic therapies at discharge:  No   Has Patient had three or more failed trials of antipsychotic monotherapy by history:  No  Recommended Plan for Multiple Antipsychotic Therapies: NA    Chase Moss,JANARDHAHA R. 03/19/2014, 11:58 AM

## 2014-03-19 NOTE — Progress Notes (Signed)
D   Pt is pleasant on approach and is cooperative   He denies suicidal ideation  He was active in karaoke group this evening   He interacts well with others A   Verbal support given   Medications administered and effectiveness monitored   Q 15 min checks R   Pt safe at present

## 2014-03-19 NOTE — Progress Notes (Signed)
Patient discharged per physician order; patient denies SI/HI and A/V hallucinations; patient received prescriptions and copy of AVS after it was reviewed; patient had no other questions or concerns at this time; patient signed and verbalized that he received all belongings; patient left the unit ambulatory

## 2014-03-24 NOTE — Progress Notes (Signed)
Patient Discharge Instructions:  After Visit Summary (AVS):   Faxed to:  03/24/14 Discharge Summary Note:   Faxed to:  03/24/14 Psychiatric Admission Assessment Note:   Faxed to:  03/24/14 Suicide Risk Assessment - Discharge Assessment:   Faxed to:  03/24/14 Faxed/Sent to the Next Level Care provider:  03/24/14 [facility] to [facility] @ [phone]  Jerelene ReddenSheena E Johnson, 03/24/2014, 3:19 PM

## 2014-07-06 ENCOUNTER — Ambulatory Visit (HOSPITAL_COMMUNITY)
Admission: RE | Admit: 2014-07-06 | Discharge: 2014-07-06 | Disposition: A | Discharge status: Home / Self Care | Payer: PRIVATE HEALTH INSURANCE | Attending: Psychiatry | Admitting: Psychiatry

## 2014-07-06 ENCOUNTER — Emergency Department (HOSPITAL_COMMUNITY)
Admission: EM | Admit: 2014-07-06 | Discharge: 2014-07-07 | Disposition: A | Discharge status: Home / Self Care | Payer: Medicaid Other | Attending: Emergency Medicine | Admitting: Emergency Medicine

## 2014-07-06 ENCOUNTER — Encounter (HOSPITAL_COMMUNITY): Payer: Self-pay | Admitting: Emergency Medicine

## 2014-07-06 DIAGNOSIS — Z79899 Other long term (current) drug therapy: Secondary | ICD-10-CM | POA: Insufficient documentation

## 2014-07-06 DIAGNOSIS — F332 Major depressive disorder, recurrent severe without psychotic features: Secondary | ICD-10-CM | POA: Insufficient documentation

## 2014-07-06 DIAGNOSIS — R45850 Homicidal ideations: Secondary | ICD-10-CM | POA: Diagnosis not present

## 2014-07-06 DIAGNOSIS — F32A Depression, unspecified: Secondary | ICD-10-CM

## 2014-07-06 DIAGNOSIS — F1910 Other psychoactive substance abuse, uncomplicated: Secondary | ICD-10-CM

## 2014-07-06 DIAGNOSIS — F1010 Alcohol abuse, uncomplicated: Secondary | ICD-10-CM | POA: Diagnosis not present

## 2014-07-06 DIAGNOSIS — R45851 Suicidal ideations: Secondary | ICD-10-CM | POA: Insufficient documentation

## 2014-07-06 DIAGNOSIS — F4310 Post-traumatic stress disorder, unspecified: Secondary | ICD-10-CM | POA: Diagnosis not present

## 2014-07-06 DIAGNOSIS — F329 Major depressive disorder, single episode, unspecified: Secondary | ICD-10-CM

## 2014-07-06 DIAGNOSIS — F17200 Nicotine dependence, unspecified, uncomplicated: Secondary | ICD-10-CM | POA: Insufficient documentation

## 2014-07-06 DIAGNOSIS — F309 Manic episode, unspecified: Secondary | ICD-10-CM | POA: Insufficient documentation

## 2014-07-06 DIAGNOSIS — I10 Essential (primary) hypertension: Secondary | ICD-10-CM | POA: Insufficient documentation

## 2014-07-06 DIAGNOSIS — F319 Bipolar disorder, unspecified: Secondary | ICD-10-CM

## 2014-07-06 DIAGNOSIS — F1994 Other psychoactive substance use, unspecified with psychoactive substance-induced mood disorder: Secondary | ICD-10-CM | POA: Diagnosis present

## 2014-07-06 DIAGNOSIS — F3289 Other specified depressive episodes: Secondary | ICD-10-CM | POA: Diagnosis present

## 2014-07-06 DIAGNOSIS — F1020 Alcohol dependence, uncomplicated: Secondary | ICD-10-CM

## 2014-07-06 LAB — CBC WITH DIFFERENTIAL/PLATELET
BASOS PCT: 0 % (ref 0–1)
Basophils Absolute: 0 10*3/uL (ref 0.0–0.1)
Eosinophils Absolute: 0.1 10*3/uL (ref 0.0–0.7)
Eosinophils Relative: 1 % (ref 0–5)
HCT: 46.5 % (ref 39.0–52.0)
Hemoglobin: 15.9 g/dL (ref 13.0–17.0)
LYMPHS PCT: 45 % (ref 12–46)
Lymphs Abs: 3.5 10*3/uL (ref 0.7–4.0)
MCH: 30.8 pg (ref 26.0–34.0)
MCHC: 34.2 g/dL (ref 30.0–36.0)
MCV: 89.9 fL (ref 78.0–100.0)
Monocytes Absolute: 0.6 10*3/uL (ref 0.1–1.0)
Monocytes Relative: 8 % (ref 3–12)
Neutro Abs: 3.6 10*3/uL (ref 1.7–7.7)
Neutrophils Relative %: 46 % (ref 43–77)
PLATELETS: 175 10*3/uL (ref 150–400)
RBC: 5.17 MIL/uL (ref 4.22–5.81)
RDW: 13.3 % (ref 11.5–15.5)
WBC: 7.8 10*3/uL (ref 4.0–10.5)

## 2014-07-06 LAB — COMPREHENSIVE METABOLIC PANEL
ALBUMIN: 4.1 g/dL (ref 3.5–5.2)
ALT: 28 U/L (ref 0–53)
AST: 25 U/L (ref 0–37)
Alkaline Phosphatase: 70 U/L (ref 39–117)
Anion gap: 15 (ref 5–15)
BUN: 12 mg/dL (ref 6–23)
CO2: 23 meq/L (ref 19–32)
Calcium: 9.5 mg/dL (ref 8.4–10.5)
Chloride: 98 mEq/L (ref 96–112)
Creatinine, Ser: 1.15 mg/dL (ref 0.50–1.35)
GFR calc Af Amer: 85 mL/min — ABNORMAL LOW (ref >90)
GFR, EST NON AFRICAN AMERICAN: 73 mL/min — AB (ref >90)
Glucose, Bld: 103 mg/dL — ABNORMAL HIGH (ref 70–99)
Potassium: 4.3 mEq/L (ref 3.7–5.3)
SODIUM: 136 meq/L — AB (ref 137–147)
TOTAL PROTEIN: 7 g/dL (ref 6.0–8.3)
Total Bilirubin: 0.7 mg/dL (ref 0.3–1.2)

## 2014-07-06 LAB — RAPID URINE DRUG SCREEN, HOSP PERFORMED
AMPHETAMINES: NOT DETECTED
BARBITURATES: NOT DETECTED
BENZODIAZEPINES: NOT DETECTED
COCAINE: POSITIVE — AB
OPIATES: NOT DETECTED
TETRAHYDROCANNABINOL: POSITIVE — AB

## 2014-07-06 LAB — ETHANOL: Alcohol, Ethyl (B): <11 mg/dL (ref 0–11)

## 2014-07-06 MED ORDER — LORAZEPAM 1 MG PO TABS
0.0000 mg | ORAL_TABLET | Freq: Four times a day (QID) | ORAL | Status: DC
  Administered 2014-07-06: 1 mg via ORAL
  Filled 2014-07-06: qty 1

## 2014-07-06 MED ORDER — LORAZEPAM 1 MG PO TABS: 0.0000 mg | ORAL_TABLET | Freq: Two times a day (BID) | ORAL | Status: DC

## 2014-07-06 MED ORDER — QUETIAPINE FUMARATE 100 MG PO TABS
100.0000 mg | ORAL_TABLET | Freq: Every day | ORAL | Status: DC
  Filled 2014-07-06: qty 1

## 2014-07-06 MED ORDER — PRAZOSIN HCL 2 MG PO CAPS
2.0000 mg | ORAL_CAPSULE | Freq: Every day | ORAL | Status: DC
  Filled 2014-07-06: qty 1

## 2014-07-06 MED ORDER — IBUPROFEN 200 MG PO TABS: 600.0000 mg | ORAL_TABLET | Freq: Three times a day (TID) | ORAL | Status: DC | PRN

## 2014-07-06 MED ORDER — LISINOPRIL 5 MG PO TABS
5.0000 mg | ORAL_TABLET | Freq: Every evening | ORAL | Status: DC
  Administered 2014-07-06: 5 mg via ORAL
  Filled 2014-07-06 (×2): qty 1

## 2014-07-06 MED ORDER — ATENOLOL 50 MG PO TABS
50.0000 mg | ORAL_TABLET | Freq: Every evening | ORAL | Status: DC
  Administered 2014-07-06: 50 mg via ORAL
  Filled 2014-07-06 (×2): qty 1

## 2014-07-06 MED ORDER — ACETAMINOPHEN 325 MG PO TABS
650.0000 mg | ORAL_TABLET | ORAL | Status: DC | PRN
Start: 2014-07-06 — End: 2014-07-07

## 2014-07-06 MED ORDER — SIMVASTATIN 20 MG PO TABS
20.0000 mg | ORAL_TABLET | Freq: Every day | ORAL | Status: DC
  Administered 2014-07-06: 20 mg via ORAL
  Filled 2014-07-06 (×2): qty 1

## 2014-07-06 MED ORDER — PRAZOSIN HCL 1 MG PO CAPS
2.0000 mg | ORAL_CAPSULE | Freq: Every day | ORAL | Status: DC
Start: 2014-07-06 — End: 2014-07-07
  Administered 2014-07-06: 2 mg via ORAL
  Filled 2014-07-06 (×2): qty 2

## 2014-07-06 MED ORDER — ONDANSETRON HCL 4 MG PO TABS: 4.0000 mg | ORAL_TABLET | Freq: Three times a day (TID) | ORAL | Status: DC | PRN
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
## 2014-07-06 NOTE — ED Notes (Signed)
Patient and belongings have been searched and wanded by security. Patient had 1 patient belonging bag and 2 duffel bags.
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
## 2014-07-07 ENCOUNTER — Encounter (HOSPITAL_COMMUNITY): Payer: Self-pay | Admitting: Registered Nurse

## 2014-07-07 DIAGNOSIS — F1020 Alcohol dependence, uncomplicated: Secondary | ICD-10-CM

## 2014-07-07 DIAGNOSIS — F329 Major depressive disorder, single episode, unspecified: Secondary | ICD-10-CM

## 2014-07-07 DIAGNOSIS — F1994 Other psychoactive substance use, unspecified with psychoactive substance-induced mood disorder: Secondary | ICD-10-CM | POA: Diagnosis present

## 2014-07-07 DIAGNOSIS — F3289 Other specified depressive episodes: Secondary | ICD-10-CM

## 2014-07-07 DIAGNOSIS — F1910 Other psychoactive substance abuse, uncomplicated: Secondary | ICD-10-CM

## 2014-07-07 NOTE — Consult Note (Signed)
[facility] Consult   Reason for Consult:  Polysubstance abuse  Referring Physician:  EDP  Chase Moss is an 48 y.o. male. Total Time spent with patient: 45 minutes  Assessment: AXIS I:  Alcohol Abuse, Substance Abuse and Substance Induced Mood Disorder AXIS II:  Deferred AXIS III:   Past Medical History  Diagnosis Date  . Hypertension   . Depression   . PTSD (post-traumatic stress disorder)    AXIS IV:  other psychosocial or environmental problems AXIS V:  61-70 mild symptoms  Plan:  No evidence of imminent risk to self or others at present.   Patient does not meet criteria for psychiatric inpatient admission. Supportive therapy provided about ongoing stressors. Discussed crisis plan, support from social network, calling 911, coming to the Emergency Department, and calling Suicide Hotline.  Dr. Louretta Shorten in agreement with plan.  Subjective:   Chase Moss is a 48 y.o. male.  HPI:  Patient states "I just lay around sleeping all the time.  I am depressed about my mothers health and I can't do anything about it.  She lives in [place] and I don't have the money to go see her.  I didn't pay my rent this month cause I spent all of my money on drugs.  I don't know what you can do for me.  I need to get back on my meds." Patient states that he is suppose to be seeing Dr. Eulas Post (psychiatrist in [facility]).  "I haven't seen him I have no transportation.  Patient states that he is not taking his medications "I ain't taking the medicines that was prescribed to me the last time I was in the hospital at [facility]; but I want to get back on my medications."  Patient has had passive suicidal/homicidal thoughts with no plan and no intention of killing himself or hurting anyone else.  At this time patient denies suicidal/homicidal ideation, psychosis, and paranoia.  Interviewer discussing with patient to return to his outpatient provider to get back on his  medications and discussed [facility] IOP and other programs with him. Patient began to get angry; started yelling "I'm not going to wait all day for you to tell me this shit; If I got to go home, I'm going right now.  You did that shit when I was in the hospital started me on some medicine and then sent me home; I ain't going to Dr. Eulas Post cause he ain't worth a shit."  Informed patient that for depression he need to follow up with Dr. Eulas Post and that he would also be given resources for other outpatient resources for substance abuse.    HPI Elements:   Location:  Polysubstance abuse. Quality:  Alcohol, cocaine, THC use. Severity:  substance induced mood disorder. Timing:  yesterday. Review of Systems  Constitutional: Negative for diaphoresis.  Gastrointestinal: Negative for nausea, vomiting, abdominal pain and diarrhea.  Musculoskeletal: Negative.   Neurological: Negative for tremors and headaches.  Psychiatric/Behavioral: Positive for depression and substance abuse. Negative for suicidal ideas, hallucinations and memory loss. The patient is not nervous/anxious and does not have insomnia.    Family History  Problem Relation Age of Onset  . Heart attack Other     Past Psychiatric History: Past Medical History  Diagnosis Date  . Hypertension   . Depression   . PTSD (post-traumatic stress disorder)     reports that he has been smoking Cigarettes.  He has a 15 pack-year smoking history. He has never used smokeless  tobacco. He reports that he drinks about 1.2 ounces of alcohol per week. He reports that he uses illicit drugs (Marijuana). Family History  Problem Relation Age of Onset  . Heart attack Other            Allergies:  No Known Allergies  ACT Assessment Complete:  Yes:    Educational Status    Risk to Self: Risk to self Is patient at risk for suicide?: Yes Substance abuse history and/or treatment for substance abuse?: Yes  Risk to Others:    Abuse:    Prior Inpatient Therapy:     Prior Outpatient Therapy:    Additional Information:       Objective: Blood pressure 126/85, pulse 63, temperature 97.4 F (36.3 C), temperature source Oral, resp. rate 16, SpO2 100.00%.There is no weight on file to calculate BMI. Results for orders placed during the hospital encounter of 07/06/14 (from the past 72 hour(s))  URINE RAPID DRUG SCREEN (HOSP PERFORMED)     Status: Abnormal   Collection Time    07/06/14  1:17 PM      Result Value Ref Range   Opiates NONE DETECTED  NONE DETECTED   Cocaine POSITIVE (*) NONE DETECTED   Benzodiazepines NONE DETECTED  NONE DETECTED   Amphetamines NONE DETECTED  NONE DETECTED   Tetrahydrocannabinol POSITIVE (*) NONE DETECTED   Barbiturates NONE DETECTED  NONE DETECTED   Comment:            DRUG SCREEN FOR MEDICAL PURPOSES     ONLY.  IF CONFIRMATION IS NEEDED     FOR ANY PURPOSE, NOTIFY LAB     WITHIN 5 DAYS.                LOWEST DETECTABLE LIMITS     FOR URINE DRUG SCREEN     Drug Class       Cutoff (ng/mL)     Amphetamine      1000     Barbiturate      200     Benzodiazepine   027     Tricyclics       741     Opiates          300     Cocaine          300     THC              50  CBC WITH DIFFERENTIAL     Status: None   Collection Time    07/06/14  1:21 PM      Result Value Ref Range   WBC 7.8  4.0 - 10.5 K/uL   RBC 5.17  4.22 - 5.81 MIL/uL   Hemoglobin 15.9  13.0 - 17.0 g/dL   HCT 46.5  39.0 - 52.0 %   MCV 89.9  78.0 - 100.0 fL   MCH 30.8  26.0 - 34.0 pg   MCHC 34.2  30.0 - 36.0 g/dL   RDW 13.3  11.5 - 15.5 %   Platelets 175  150 - 400 K/uL   Neutrophils Relative % 46  43 - 77 %   Neutro Abs 3.6  1.7 - 7.7 K/uL   Lymphocytes Relative 45  12 - 46 %   Lymphs Abs 3.5  0.7 - 4.0 K/uL   Monocytes Relative 8  3 - 12 %   Monocytes Absolute 0.6  0.1 - 1.0 K/uL   Eosinophils Relative 1  0 - 5 %   Eosinophils  Absolute 0.1  0.0 - 0.7 K/uL   Basophils Relative 0  0 - 1 %   Basophils Absolute 0.0  0.0 - 0.1 K/uL  COMPREHENSIVE  METABOLIC PANEL     Status: Abnormal   Collection Time    07/06/14  1:21 PM      Result Value Ref Range   Sodium 136 (*) 137 - 147 mEq/L   Potassium 4.3  3.7 - 5.3 mEq/L   Chloride 98  96 - 112 mEq/L   CO2 23  19 - 32 mEq/L   Glucose, Bld 103 (*) 70 - 99 mg/dL   BUN 12  6 - 23 mg/dL   Creatinine, Ser 1.15  0.50 - 1.35 mg/dL   Calcium 9.5  8.4 - 10.5 mg/dL   Total Protein 7.0  6.0 - 8.3 g/dL   Albumin 4.1  3.5 - 5.2 g/dL   AST 25  0 - 37 U/L   ALT 28  0 - 53 U/L   Alkaline Phosphatase 70  39 - 117 U/L   Total Bilirubin 0.7  0.3 - 1.2 mg/dL   GFR calc non Af Amer 73 (*) >90 mL/min   GFR calc Af Amer 85 (*) >90 mL/min   Comment: (NOTE)     The eGFR has been calculated using the CKD EPI equation.     This calculation has not been validated in all clinical situations.     eGFR's persistently <90 mL/min signify possible Chronic Kidney     Disease.   Anion gap 15  5 - 15  ETHANOL     Status: None   Collection Time    07/06/14  1:21 PM      Result Value Ref Range   Alcohol, Ethyl (B) <11  0 - 11 mg/dL   Comment:            LOWEST DETECTABLE LIMIT FOR     SERUM ALCOHOL IS 11 mg/dL     FOR MEDICAL PURPOSES ONLY   Labs are reviewed see above values.  Patient ETOH < 11, UDS positive for cocaine and THC.  Medications reviewed and no changes made.  Current Facility-Administered Medications  Medication Dose Route Frequency Provider Last Rate Last Dose  . acetaminophen (TYLENOL) tablet 650 mg  650 mg Oral Q4H PRN Jasper Riling. Pickering, MD      . atenolol (TENORMIN) tablet 50 mg  50 mg Oral QPM Nathan R. Pickering, MD   50 mg at 07/06/14 1837  . ibuprofen (ADVIL,MOTRIN) tablet 600 mg  600 mg Oral Q8H PRN Jasper Riling. Pickering, MD      . lisinopril (PRINIVIL,ZESTRIL) tablet 5 mg  5 mg Oral QPM Nathan R. Pickering, MD   5 mg at 07/06/14 1837  . LORazepam (ATIVAN) tablet 0-4 mg  0-4 mg Oral 4 times per day Jasper Riling. Pickering, MD   1 mg at 07/06/14 1837   Followed by  . [START ON 07/08/2014]  LORazepam (ATIVAN) tablet 0-4 mg  0-4 mg Oral Q12H Nathan R. Pickering, MD      . ondansetron [facility]) tablet 4 mg  4 mg Oral Q8H PRN Jasper Riling. Pickering, MD      . prazosin (MINIPRESS) capsule 2 mg  2 mg Oral QHS Nathan R. Pickering, MD   2 mg at 07/06/14 2214  . QUEtiapine (SEROQUEL) tablet 100 mg  100 mg Oral QHS Nathan R. Pickering, MD      . simvastatin (ZOCOR) tablet 20 mg  20 mg Oral q1800  Jasper Riling. Alvino Chapel, MD   20 mg at 07/06/14 1837   Current Outpatient Prescriptions  Medication Sig Dispense Refill  . atenolol (TENORMIN) 50 MG tablet Take 50 mg by mouth every evening.      Marland Kitchen lisinopril (PRINIVIL,ZESTRIL) 5 MG tablet Take 5 mg by mouth every evening.      . pravastatin (PRAVACHOL) 40 MG tablet Take 40 mg by mouth every evening.      . prazosin (MINIPRESS) 2 MG capsule Take 2 mg by mouth at bedtime.      Marland Kitchen QUEtiapine (SEROQUEL) 100 MG tablet Take 25-100 mg by mouth at bedtime.        Psychiatric Specialty Exam:     Blood pressure 126/85, pulse 63, temperature 97.4 F (36.3 C), temperature source Oral, resp. rate 16, SpO2 100.00%.There is no weight on file to calculate BMI.  General Appearance: Casual  Eye Contact::  Good  Speech:  Clear and Coherent and Normal Rate  Volume:  Increased  Mood:  Irritable  Affect:  Congruent  Thought Process:  Circumstantial  Orientation:  Full (Time, Place, and Person)  Thought Content:  Rumination  Suicidal Thoughts:  No  Homicidal Thoughts:  No  Memory:  Immediate;   Good Recent;   Good Remote;   Good  Judgement:  Fair  Insight:  Shallow  Psychomotor Activity:  Normal  Concentration:  Fair  Recall:  Good  Fund of Knowledge:Good  Language: Good  Akathisia:  No  Handed:  Right  AIMS (if indicated):     Assets:  Communication Skills Desire for Improvement Housing Social Support  Sleep:      Musculoskeletal: Strength & Muscle Tone: within normal limits Gait & Station: normal Patient leans: N/A  Treatment Plan  Summary: Discharge home.  Patient to follow up with Dr. Eulas Post for medication management.  Patient to be given resources for substance abuse assistance.         Earleen Newport, FNP-BC 07/07/2014 12:20 PM  Patient is seen face to face for psychiatric evaluation along with physician extender, case discussed after clinical rounds and formulated appropriate treatment plan. Reviewed the information documented and agree with the treatment plan.  Verbena Boeding,JANARDHAHA R. 07/08/2014 9:57 AM

## 2014-07-07 NOTE — ED Notes (Signed)
D/C instructions reviewed. Provided with a bus pass and information on the different bus routes. Ambulatory without difficulty. Denies pain. No complaints voiced. Escorted by Minerva AreolaEric RN to front of hospital.

## 2014-07-07 NOTE — Discharge Instructions (Signed)
Finding Treatment for Alcohol and Drug Addiction It can be hard to find the right place to get professional treatment. Here are some important things to consider:  There are different types of treatment to choose from.  Some programs are live-in (residential) while others are not (outpatient). Sometimes a combination is offered.  No single type of program is right for everyone.  Most treatment programs involve a combination of education, counseling, and a 12-step, spiritually-based approach.  There are non-spiritually based programs (not 12-step).  Some treatment programs are government sponsored. They are geared for patients without private insurance.  Treatment programs can vary in many respects such as:  Cost and types of insurance accepted.  Types of on-site medical services offered.  Length of stay, setting, and size.  Overall philosophy of treatment. A person may need specialized treatment or have needs not addressed by all programs. For example, adolescents need treatment appropriate for their age. Other people have secondary disorders that must be managed as well. Secondary conditions can include mental illness, such as depression or diabetes. Often, a period of detoxification from alcohol or drugs is needed. This requires medical supervision and not all programs offer this. THINGS TO CONSIDER WHEN SELECTING A TREATMENT PROGRAM   Is the program certified by the appropriate government agency? Even private programs must be certified and employ certified professionals.  Does the program accept your insurance? If not, can a payment plan be set up?  Is the facility clean, organized, and well run? Do they allow you to speak with graduates who can share their treatment experience with you? Can you tour the facility? Can you meet with staff?  Does the program meet the full range of individual needs?  Does the treatment program address sexual orientation and physical disabilities?  Do they provide age, gender, and culturally appropriate treatment services?  Is treatment available in languages other than English?  Is long-term aftercare support or guidance encouraged and provided?  Is assessment of an individual's treatment plan ongoing to ensure it meets changing needs?  Does the program use strategies to encourage reluctant patients to remain in treatment long enough to increase the likelihood of success?  Does the program offer counseling (individual or group) and other behavioral therapies?  Does the program offer medicine as part of the treatment regimen, if needed?  Is there ongoing monitoring of possible relapse? Is there a defined relapse prevention program? Are services or referrals offered to family members to ensure they understand addiction and the recovery process? This would help them support the recovering individual.  Are 12-step meetings held at the center or is transport available for patients to attend outside meetings? In countries outside of the [place] and [place], [employer] for contact information for services in your area. Document Released: 11/08/2005 Document Revised: 03/03/2012 Document Reviewed: 05/20/2008 [facility] Patient Information 2015 [place], [place]. This information is not intended to replace advice given to you by your health care provider. Make sure you discuss any questions you have with your health care provider.  Depression, Adult Depression is feeling sad, low, down in the dumps, blue, gloomy, or empty. In general, there are two kinds of depression:  Normal sadness or grief. This can happen after something upsetting. It often goes away on its own within 2 weeks. After losing a loved one (bereavement), normal sadness and grief may last longer than two weeks. It usually gets better with time.  Clinical depression. This kind lasts longer than normal sadness or grief. It  keeps you from doing the things you normally do in  life. It is often hard to function at home, work, or at school. It may affect your relationships with others. Treatment is often needed. GET HELP RIGHT AWAY IF:  You have thoughts about hurting yourself or others.  You lose touch with reality (psychotic symptoms). You may:  See or hear things that are not real.  Have untrue beliefs about your life or people around you.  Your medicine is giving you problems. MAKE SURE YOU:  Understand these instructions.  Will watch your condition.  Will get help right away if you are not doing well or get worse. Document Released: 01/12/2011 Document Revised: 09/03/2012 Document Reviewed: 04/10/2012 [facility] Patient Information 2015 [place], [place]. This information is not intended to replace advice given to you by your health care provider. Make sure you discuss any questions you have with your health care provider.  Mood Disorders Mood disorders are conditions that affect the way a person feels emotionally. The main mood disorders include:  Depression.  Bipolar disorder.  Dysthymia. Dysthymia is a mild, lasting (chronic) depression. Symptoms of dysthymia are similar to depression, but not as severe.  Cyclothymia. Cyclothymia includes mood swings, but the highs and lows are not as severe as they are in bipolar disorder. Symptoms of cyclothymia are similar to those of bipolar disorder, but less extreme. CAUSES  Mood disorders are probably caused by a combination of factors. People with mood disorders seem to have physical and chemical changes in their brains. Mood disorders run in families, so there may be genetic causes. Severe trauma or stressful life events may also increase the risk of mood disorders.  SYMPTOMS  Symptoms of mood disorders depend on the specific type of condition. Depression symptoms include:  Feeling sad, worthless, or hopeless.  Negative thoughts.  Inability to enjoy one's usual activities.  Low energy.  Sleeping  too much or too little.  Appetite changes.  Crying.  Concentration problems.  Thoughts of harming oneself. Bipolar disorder symptoms include:  Periods of depression (see above symptoms).  Mood swings, from sadness and depression, to abnormal elation and excitement.  Periods of mania:  Racing thoughts.  Fast speech.  Poor judgment, and careless, dangerous choices.  Decreased need for sleep.  Risky behavior.  Difficulty concentrating.  Irritability.  Increased energy.  Increased sex drive. DIAGNOSIS  There are no blood tests or X-rays that can confirm a mood disorder. However, your caregiver may choose to run some tests to make sure that there is not another physical cause for your symptoms. A mood disorder is usually diagnosed after an in-depth interview with a caregiver. TREATMENT  Mood disorders can be treated with one or more of the following:  Medicine. This may include antidepressants, mood-stabilizers, or anti-psychotics.  Psychotherapy (talk therapy).  Cognitive behavioral therapy. You are taught to recognize negative thoughts and behavior patterns, and replace them with healthy thoughts and behaviors.  Electroconvulsive therapy. For very severe cases of deep depression, a series of treatments in which an electrical current is applied to the brain.  Vagus nerve stimulation. A pulse of electricity is applied to a portion of the brain.  Transcranial magnetic stimulation. Powerful magnets are placed on the head that produce electrical currents.  Hospitalization. In severe situations, or when someone is having serious thoughts of harming him or herself, hospitalization may be necessary in order to keep the person safe. This is also done to quickly start and monitor treatment. HOME CARE INSTRUCTIONS  Take your medicine exactly as directed.  Attend all of your therapy sessions.  Try to eat regular, healthy meals.  Exercise daily. Exercise may improve mood  symptoms.  Get good sleep.  Do not drink alcohol or use pot or other drugs. These can worsen mood symptoms and cause anxiety and psychosis.  Tell your caregiver if you develop any side effects, such as feeling sick to your stomach (nauseous), dry mouth, dizziness, constipation, drowsiness, tremor, weight gain, or sexual symptoms. He or she may suggest things you can do to improve symptoms.  Learn ways to cope with the stress of having a chronic illness. This includes yoga, meditation, tai chi, or participating in a support group.  Drink enough water to keep your urine clear or pale yellow. Eat a high-fiber diet. These habits may help you avoid constipation from your medicine. SEEK IMMEDIATE MEDICAL CARE IF:  Your mood worsens.  You have thoughts of hurting yourself or others.  You cannot care for yourself.  You develop the sensation of hearing or seeing something that is not actually present (auditory or visual hallucinations).  You develop abnormal thoughts. Document Released: 10/07/2009 Document Revised: 03/03/2012 Document Reviewed: 10/07/2009 [facility] Patient Information 2015 [place], [place]. This information is not intended to replace advice given to you by your health care provider. Make sure you discuss any questions you have with your health care provider.  Polysubstance Abuse When people abuse more than one drug or type of drug it is called polysubstance or polydrug abuse. For example, many smokers also drink alcohol. This is one form of polydrug abuse. Polydrug abuse also refers to the use of a drug to counteract an unpleasant effect produced by another drug. It may also be used to help with withdrawal from another drug. People who take stimulants may become agitated. Sometimes this agitation is countered with a tranquilizer. This helps protect against the unpleasant side effects. Polydrug abuse also refers to the use of different drugs at the same time.  Anytime drug use is  interfering with normal living activities, it has become abuse. This includes problems with family and friends. Psychological dependence has developed when your mind tells you that the drug is needed. This is usually followed by physical dependence which has developed when continuing increases of drug are required to get the same feeling or "high". This is known as addiction or chemical dependency. A person's risk is much higher if there is a history of chemical dependency in the family. SIGNS OF CHEMICAL DEPENDENCY  You have been told by friends or family that drugs have become a problem.  You fight when using drugs.  You are having blackouts (not remembering what you do while using).  You feel sick from using drugs but continue using.  You lie about use or amounts of drugs (chemicals) used.  You need chemicals to get you going.  You are suffering in work performance or in school because of drug use.  You get sick from use of drugs but continue to use anyway.  You need drugs to relate to people or feel comfortable in social situations.  You use drugs to forget problems. "Yes" answered to any of the above signs of chemical dependency indicates there are problems. The longer the use of drugs continues, the greater the problems will become. If there is a family history of drug or alcohol use, it is best not to experiment with these drugs. Continual use leads to tolerance. After tolerance develops more of the drug  is needed to get the same feeling. This is followed by addiction. With addiction, drugs become the most important part of life. It becomes more important to take drugs than participate in the other usual activities of life. This includes relating to friends and family. Addiction is followed by dependency. Dependency is a condition where drugs are now needed not just to get high, but to feel normal. Addiction cannot be cured but it can be stopped. This often requires outside help and  the care of professionals. Treatment centers are listed in the yellow pages under: Cocaine, Narcotics, and Alcoholics Anonymous. Most hospitals and clinics can refer you to a specialized care center. Talk to your caregiver if you need help. Document Released: 08/01/2005 Document Revised: 03/03/2012 Document Reviewed: 12/10/2005 [facility] Patient Information 2015 [place], [place]. This information is not intended to replace advice given to you by your health care provider. Make sure you discuss any questions you have with your health care provider.

## 2014-07-07 NOTE — [facility] Suicide Risk Assessment (Cosign Needed)
Suicide Risk Assessment  Discharge Assessment     Demographic Factors:  Male, Caucasian and Unemployed  Total Time spent with patient: 30 minutes  Psychiatric Specialty Exam:      Blood pressure 126/85, pulse 63, temperature 97.4 F (36.3 C), temperature source Oral, resp. rate 16, SpO2 100.00%.There is no weight on file to calculate BMI.   General Appearance: Casual   Eye Contact:: Good   Speech: Clear and Coherent and Normal Rate   Volume: Increased   Mood: Irritable   Affect: Congruent   Thought Process: Circumstantial   Orientation: Full (Time, Place, and Person)   Thought Content: Rumination   Suicidal Thoughts: No   Homicidal Thoughts: No   Memory: Immediate; Good  Recent; Good  Remote; Good   Judgement: Fair   Insight: Shallow   Psychomotor Activity: Normal   Concentration: Fair   Recall: Good   Fund of Knowledge:Good   Language: Good   Akathisia: No   Handed: Right   AIMS (if indicated):   Assets: Communication Skills  Desire for Improvement  Housing  Social Support   Sleep:   Musculoskeletal:  Strength & Muscle Tone: within normal limits  Gait & Station: normal  Patient leans: N/A   Mental Status Per Nursing Assessment::   On Admission:     Current Mental Status by Physician: Padient denies suicidal/homicidal ideation, psychosis, and paranoia  Loss Factors: NA  Historical Factors: NA  Risk Reduction Factors:   Living with another person, especially a relative  Continued Clinical Symptoms:  Alcohol/Substance Abuse/Dependencies  Cognitive Features That Contribute To Risk:  Closed-mindedness    Suicide Risk:  Minimal: No identifiable suicidal ideation.  Patients presenting with no risk factors but with morbid ruminations; may be classified as minimal risk based on the severity of the depressive symptoms  Discharge Diagnoses:  AXIS I: Alcohol Abuse, Substance Abuse and Substance Induced Mood Disorder  AXIS II: Deferred  AXIS III:  Past  Medical History   Diagnosis  Date   .  Hypertension    .  Depression    .  PTSD (post-traumatic stress disorder)     AXIS IV: other psychosocial or environmental problems  AXIS V: 61-70 mild symptoms   Plan Of Care/Follow-up recommendations:  Activity:  Resume usual activity Diet:  Resume usual diet  Is patient on multiple antipsychotic therapies at discharge:  No   Has Patient had three or more failed trials of antipsychotic monotherapy by history:  No  Recommended Plan for Multiple Antipsychotic Therapies: NA    Rankin, ShuvonFNP-BC 07/07/2014, 12:43 PM

## 2014-08-16 ENCOUNTER — Emergency Department (HOSPITAL_COMMUNITY)
Admission: EM | Admit: 2014-08-16 | Discharge: 2014-08-17 | Disposition: A | Discharge status: Home / Self Care | Payer: Medicaid Other | Attending: Emergency Medicine | Admitting: Emergency Medicine

## 2014-08-16 DIAGNOSIS — Z79899 Other long term (current) drug therapy: Secondary | ICD-10-CM | POA: Diagnosis not present

## 2014-08-16 DIAGNOSIS — E7800 Pure hypercholesterolemia, unspecified: Secondary | ICD-10-CM | POA: Diagnosis not present

## 2014-08-16 DIAGNOSIS — F329 Major depressive disorder, single episode, unspecified: Secondary | ICD-10-CM | POA: Insufficient documentation

## 2014-08-16 DIAGNOSIS — R519 Headache, unspecified: Secondary | ICD-10-CM

## 2014-08-16 DIAGNOSIS — R109 Unspecified abdominal pain: Secondary | ICD-10-CM | POA: Insufficient documentation

## 2014-08-16 DIAGNOSIS — F3289 Other specified depressive episodes: Secondary | ICD-10-CM | POA: Diagnosis not present

## 2014-08-16 DIAGNOSIS — I10 Essential (primary) hypertension: Secondary | ICD-10-CM | POA: Insufficient documentation

## 2014-08-16 DIAGNOSIS — R112 Nausea with vomiting, unspecified: Secondary | ICD-10-CM | POA: Insufficient documentation

## 2014-08-16 DIAGNOSIS — F4310 Post-traumatic stress disorder, unspecified: Secondary | ICD-10-CM | POA: Insufficient documentation

## 2014-08-16 DIAGNOSIS — R51 Headache: Secondary | ICD-10-CM | POA: Diagnosis not present

## 2014-08-16 DIAGNOSIS — F17200 Nicotine dependence, unspecified, uncomplicated: Secondary | ICD-10-CM | POA: Diagnosis not present

## 2014-08-17 ENCOUNTER — Encounter (HOSPITAL_COMMUNITY): Payer: Self-pay | Admitting: Emergency Medicine

## 2014-08-17 ENCOUNTER — Emergency Department (HOSPITAL_COMMUNITY): Payer: Medicaid Other

## 2014-08-17 LAB — CBC WITH DIFFERENTIAL/PLATELET
Basophils Absolute: 0 10*3/uL (ref 0.0–0.1)
Basophils Relative: 0 % (ref 0–1)
Eosinophils Absolute: 0.1 10*3/uL (ref 0.0–0.7)
Eosinophils Relative: 1 % (ref 0–5)
HCT: 44.6 % (ref 39.0–52.0)
HEMOGLOBIN: 15.1 g/dL (ref 13.0–17.0)
LYMPHS ABS: 4.2 10*3/uL — AB (ref 0.7–4.0)
Lymphocytes Relative: 52 % — ABNORMAL HIGH (ref 12–46)
MCH: 30.5 pg (ref 26.0–34.0)
MCHC: 33.9 g/dL (ref 30.0–36.0)
MCV: 90.1 fL (ref 78.0–100.0)
MONOS PCT: 8 % (ref 3–12)
Monocytes Absolute: 0.7 10*3/uL (ref 0.1–1.0)
Neutro Abs: 3.1 10*3/uL (ref 1.7–7.7)
Neutrophils Relative %: 39 % — ABNORMAL LOW (ref 43–77)
PLATELETS: 180 10*3/uL (ref 150–400)
RBC: 4.95 MIL/uL (ref 4.22–5.81)
RDW: 13.2 % (ref 11.5–15.5)
WBC: 8 10*3/uL (ref 4.0–10.5)

## 2014-08-17 LAB — COMPREHENSIVE METABOLIC PANEL
ALBUMIN: 3.8 g/dL (ref 3.5–5.2)
ALK PHOS: 75 U/L (ref 39–117)
ALT: 21 U/L (ref 0–53)
ANION GAP: 16 — AB (ref 5–15)
AST: 21 U/L (ref 0–37)
BILIRUBIN TOTAL: 0.5 mg/dL (ref 0.3–1.2)
BUN: 20 mg/dL (ref 6–23)
CHLORIDE: 99 meq/L (ref 96–112)
CO2: 22 mEq/L (ref 19–32)
Calcium: 9.4 mg/dL (ref 8.4–10.5)
Creatinine, Ser: 1.05 mg/dL (ref 0.50–1.35)
GFR calc Af Amer: >90 mL/min (ref >90)
GFR calc non Af Amer: 81 mL/min — ABNORMAL LOW (ref >90)
Glucose, Bld: 111 mg/dL — ABNORMAL HIGH (ref 70–99)
Potassium: 3.6 mEq/L — ABNORMAL LOW (ref 3.7–5.3)
SODIUM: 137 meq/L (ref 137–147)
Total Protein: 6.9 g/dL (ref 6.0–8.3)

## 2014-08-17 LAB — I-STAT TROPONIN, ED: TROPONIN I, POC: 0 ng/mL (ref 0.00–0.08)

## 2014-08-17 MED ORDER — ASPIRIN 81 MG PO CHEW
324.0000 mg | CHEWABLE_TABLET | Freq: Once | ORAL | Status: AC
  Administered 2014-08-17: 324 mg via ORAL
  Filled 2014-08-17: qty 4

## 2014-08-17 NOTE — ED Notes (Signed)
Pt states he thinks his blood pressure is elevated and making his head hurt   Pt states he feels like he has run about 5 miles and he has not done anything

## 2014-08-17 NOTE — Discharge Instructions (Signed)
Your testing and chest x-ray did not show any signs for a concerning or emergent cause for your elevated blood pressure and headache. Continue tylenol or ibuprofen for headache. Use ant acid medications for any indigestion. Follow upwith your doctor for continued evaluation and treatment.    Hypertension Hypertension is another name for high blood pressure. High blood pressure forces your heart to work harder to pump blood. A blood pressure reading has two numbers, which includes a higher number over a lower number (example: 110/72). HOME CARE   Have your blood pressure rechecked by your doctor.  Only take medicine as told by your doctor. Follow the directions carefully. The medicine does not work as well if you skip doses. Skipping doses also puts you at risk for problems.  Do not smoke.  Monitor your blood pressure at home as told by your doctor. GET HELP IF:  You think you are having a reaction to the medicine you are taking.  You have repeat headaches or feel dizzy.  You have puffiness (swelling) in your ankles.  You have trouble with your vision. GET HELP RIGHT AWAY IF:   You get a very bad headache and are confused.  You feel weak, numb, or faint.  You get chest or belly (abdominal) pain.  You throw up (vomit).  You cannot breathe very well. MAKE SURE YOU:   Understand these instructions.  Will watch your condition.  Will get help right away if you are not doing well or get worse. Document Released: 05/28/2008 Document Revised: 12/15/2013 Document Reviewed: 10/02/2013 [facility] Patient Information 2015 [place], [place]. This information is not intended to replace advice given to you by your health care provider. Make sure you discuss any questions you have with your health care provider.

## 2014-08-17 NOTE — ED Notes (Signed)
Patient transported to X-ray 

## 2014-08-17 NOTE — ED Notes (Signed)
Pt states earlier today he was feeling like his chest was "thick" and he felt short of breath  Pt states it is feeling better  now

## 2014-08-17 NOTE — ED Provider Notes (Signed)
CSN: [id number]     Arrival date & time 08/16/14  2313 History   First MD Initiated Contact with Patient 08/17/14 0134     Chief Complaint  Patient presents with  . Hypertension  . Headache   HPI  History provided by the patient. Patient is a 48 year old male with history of hypertension, hypercholesterolemia, depression and PTSD who presents with concerns for elevated blood pressure. Patient states he has noticed elevated blood pressures for the past few days. Today however he was generally not feeling well with at mild diffuse headache as well as some congested pressure feeling in the chest earlier this morning. This did resolve on its own but he has had intermittent headache and pressure behind her eyes at times throughout the day. He did not take any medications or treatment for this. He does also report having some nausea for past few mornings with episodes of vomiting. Patient is a smoker and has had some morning coughing. He states that he sometimes has epigastric pain discomfort and thinks that he may have some heartburn or indigestion but has not taken any medicines for this. He reports decreased appetite today. No bowel changes. No other aggravating or alleviating factors. No fever, chills or sweats.     Past Medical History  Diagnosis Date  . Hypertension   . Depression   . PTSD (post-traumatic stress disorder)    History reviewed. No pertinent past surgical history. Family History  Problem Relation Age of Onset  . Heart attack Other    History  Substance Use Topics  . Smoking status: Current Every Day Smoker -- 0.50 packs/day for 30 years    Types: Cigarettes  . Smokeless tobacco: Never Used  . Alcohol Use: 1.2 oz/week    2 Cans of beer per week     Comment: sometimes    Review of Systems  Constitutional: Positive for fatigue. Negative for fever, chills and diaphoresis.  Respiratory: Negative for shortness of breath.   Cardiovascular: Negative for chest pain and leg  swelling.  Gastrointestinal: Positive for nausea, vomiting and abdominal pain. Negative for diarrhea and constipation.  Neurological: Positive for headaches.  All other systems reviewed and are negative.     Allergies  Review of patient's allergies indicates no known allergies.  Home Medications   Prior to Admission medications   Medication Sig Start Date End Date Taking? Authorizing Provider  ENALAPRIL MALEATE PO Take 1 tablet by mouth daily. Unknown dose pls call & verify with patient retail pharmacy   Yes Historical Provider, MD  lisinopril (PRINIVIL,ZESTRIL) 5 MG tablet Take 5 mg by mouth every evening.   Yes Historical Provider, MD  pravastatin (PRAVACHOL) 40 MG tablet Take 40 mg by mouth every evening.   Yes Historical Provider, MD  prazosin (MINIPRESS) 2 MG capsule Take 2 mg by mouth at bedtime.   Yes Historical Provider, MD  QUEtiapine (SEROQUEL XR) 300 MG 24 hr tablet Take 300 mg by mouth at bedtime.   Yes Historical Provider, MD   BP 157/91  Pulse 61  Temp(Src) 97.8 F (36.6 C) (Oral)  Resp 22  SpO2 96% Physical Exam  Nursing note and vitals reviewed. Constitutional: He is oriented to person, place, and time. He appears well-developed and well-nourished. No distress.  HENT:  Head: Normocephalic and atraumatic.  No sinus pressure or nasal congestion  Eyes: Conjunctivae and EOM are normal. Pupils are equal, round, and reactive to light.  Neck: Normal range of motion. Neck supple.  No meningeal sign  Cardiovascular: Normal rate and regular rhythm.   Pulmonary/Chest: Effort normal and breath sounds normal. No respiratory distress. He has no wheezes.  Abdominal: There is no tenderness. There is no rebound and no guarding.  Musculoskeletal: Normal range of motion. He exhibits no edema and no tenderness.  Neurological: He is alert and oriented to person, place, and time. He has normal strength. No cranial nerve deficit or sensory deficit. Coordination and gait normal.   Skin: Skin is warm. No rash noted.  Psychiatric: He has a normal mood and affect. His behavior is normal.    ED Course  Procedures   COORDINATION OF CARE:  Nursing notes reviewed. Vital signs reviewed. Initial pt interview and examination performed.   Filed Vitals:   08/17/14 0032 08/17/14 0204  BP: 170/101 157/91  Pulse: 68 61  Temp: 97.8 F (36.6 C)   TempSrc: Oral   Resp: 20 22  SpO2: 98% 96%    1:45AM-patient seen and evaluated. Patient well appearing at this time. Nonspecific symptoms and concerned about a blood pressure. Pressure is elevated at 170/101.  Recheck of blood pressure continues to be only moderately elevated 157/91. Patient continue to appear well. Denies significant headache at this time.  Laboratory testing, EKG and x-ray unremarkable. At this time patient is felt able to return home. He'll plan to follow up with his PCP for additional evaluation and blood pressure management.   Treatment plan initiated: Medications  aspirin chewable tablet 324 mg (not administered)     Results for orders placed during the hospital encounter of 08/16/14  CBC WITH DIFFERENTIAL      Result Value Ref Range   WBC 8.0  4.0 - 10.5 K/uL   RBC 4.95  4.22 - 5.81 MIL/uL   Hemoglobin 15.1  13.0 - 17.0 g/dL   HCT 40.9  81.1 - 91.4 %   MCV 90.1  78.0 - 100.0 fL   MCH 30.5  26.0 - 34.0 pg   MCHC 33.9  30.0 - 36.0 g/dL   RDW 78.2  95.6 - 21.3 %   Platelets 180  150 - 400 K/uL   Neutrophils Relative % 39 (*) 43 - 77 %   Neutro Abs 3.1  1.7 - 7.7 K/uL   Lymphocytes Relative 52 (*) 12 - 46 %   Lymphs Abs 4.2 (*) 0.7 - 4.0 K/uL   Monocytes Relative 8  3 - 12 %   Monocytes Absolute 0.7  0.1 - 1.0 K/uL   Eosinophils Relative 1  0 - 5 %   Eosinophils Absolute 0.1  0.0 - 0.7 K/uL   Basophils Relative 0  0 - 1 %   Basophils Absolute 0.0  0.0 - 0.1 K/uL  COMPREHENSIVE METABOLIC PANEL      Result Value Ref Range   Sodium 137  137 - 147 mEq/L   Potassium 3.6 (*) 3.7 - 5.3 mEq/L    Chloride 99  96 - 112 mEq/L   CO2 22  19 - 32 mEq/L   Glucose, Bld 111 (*) 70 - 99 mg/dL   BUN 20  6 - 23 mg/dL   Creatinine, Ser 0.86  0.50 - 1.35 mg/dL   Calcium 9.4  8.4 - 57.8 mg/dL   Total Protein 6.9  6.0 - 8.3 g/dL   Albumin 3.8  3.5 - 5.2 g/dL   AST 21  0 - 37 U/L   ALT 21  0 - 53 U/L   Alkaline Phosphatase 75  39 - 117 U/L  Total Bilirubin 0.5  0.3 - 1.2 mg/dL   GFR calc non Af Amer 81 (*) >90 mL/min   GFR calc Af Amer >90  >90 mL/min   Anion gap 16 (*) 5 - 15  I-STAT TROPOININ, ED      Result Value Ref Range   Troponin i, poc 0.00  0.00 - 0.08 ng/mL   Comment 3                Imaging Review Dg Chest 2 View  08/17/2014   CLINICAL DATA:  Hypertension.  Headache.  EXAM: CHEST  2 VIEW  COMPARISON:  04/13/2012  FINDINGS: The heart size and mediastinal contours are within normal limits. Both lungs are clear. The visualized skeletal structures are unremarkable.  IMPRESSION: No active cardiopulmonary disease.   Electronically Signed   By: Burman Nieves M.D.   On: 08/17/2014 02:00     Date: 08/17/2014  Rate: 69  Rhythm: normal sinus rhythm  QRS Axis: normal  Intervals: normal  ST/T Wave abnormalities: normal  Conduction Disutrbances:none  Narrative Interpretation:   Old EKG Reviewed: none available      MDM   Final diagnoses:  Essential hypertension  Headache, unspecified headache type         Angus Seller, PA-C 08/17/14 2019

## 2014-08-19 NOTE — ED Provider Notes (Signed)
Medical screening examination/treatment/procedure(s) were performed by non-physician practitioner and as supervising physician I was immediately available for consultation/collaboration.   EKG Interpretation None        Enid Skeens, MD 08/19/14 [phone]

## 2015-06-04 ENCOUNTER — Emergency Department (HOSPITAL_COMMUNITY)
Admission: EM | Admit: 2015-06-04 | Discharge: 2015-06-04 | Disposition: A | Discharge status: Home / Self Care | Payer: Medicaid Other | Attending: Emergency Medicine | Admitting: Emergency Medicine

## 2015-06-04 ENCOUNTER — Encounter (HOSPITAL_COMMUNITY): Payer: Self-pay | Admitting: Emergency Medicine

## 2015-06-04 ENCOUNTER — Emergency Department (HOSPITAL_COMMUNITY): Payer: Medicaid Other

## 2015-06-04 DIAGNOSIS — F329 Major depressive disorder, single episode, unspecified: Secondary | ICD-10-CM | POA: Diagnosis not present

## 2015-06-04 DIAGNOSIS — I10 Essential (primary) hypertension: Secondary | ICD-10-CM | POA: Insufficient documentation

## 2015-06-04 DIAGNOSIS — R0781 Pleurodynia: Secondary | ICD-10-CM | POA: Diagnosis not present

## 2015-06-04 DIAGNOSIS — F4310 Post-traumatic stress disorder, unspecified: Secondary | ICD-10-CM | POA: Diagnosis not present

## 2015-06-04 DIAGNOSIS — Z79899 Other long term (current) drug therapy: Secondary | ICD-10-CM | POA: Diagnosis not present

## 2015-06-04 DIAGNOSIS — Z720 Tobacco use: Secondary | ICD-10-CM | POA: Insufficient documentation

## 2015-06-04 MED ORDER — HYDROCODONE-ACETAMINOPHEN 5-325 MG PO TABS: 1.0000 | ORAL_TABLET | ORAL | Status: DC | PRN

## 2015-06-04 MED ORDER — HYDROCODONE-ACETAMINOPHEN 5-325 MG PO TABS
2.0000 | ORAL_TABLET | Freq: Once | ORAL | Status: AC
  Administered 2015-06-04: 2 via ORAL
  Filled 2015-06-04: qty 2

## 2015-06-04 MED ORDER — NAPROXEN 500 MG PO TABS: 500.0000 mg | ORAL_TABLET | Freq: Two times a day (BID) | ORAL | Status: DC

## 2015-06-04 NOTE — ED Provider Notes (Signed)
CSN: [id number]     Arrival date & time 06/04/15  1220 History  This chart was scribed for non-physician practitioner, Kathrynn Speed, PA-C, working with Toy Cookey, MD, by Ronney Lion, ED Scribe. This patient was seen in room WTR8/WTR8 and the patient's care was started at 12:40 PM.      Chief Complaint  Patient presents with  . Ribcage Pain    The history is provided by the patient. No language interpreter was used.    HPI Comments: Devontai Krol is a 49 y.o. male with a history of HTN, PTSD, and depression, who presents to the Emergency Department complaining of left sided rib pain radiating down his left lower back that began yesterday and worsened today. He states that 2 days ago, he was wrestling with a friend, but he denies feeling any specific pop, pull, or injury. Pushing himself up off the bed, bearing weight on the area, and deep inspiration all exacerbate the pain. Patient tried taking 2 ibuprofen when his pain started with no relief. Patient is a smoker and notes a chronic "smoker's cough." He denies any fever, chills, nausea, vomiting, or rash. Patient's brother drove him here.  Past Medical History  Diagnosis Date  . Hypertension   . Depression   . PTSD (post-traumatic stress disorder)    History reviewed. No pertinent past surgical history. Family History  Problem Relation Age of Onset  . Heart attack Other    History  Substance Use Topics  . Smoking status: Current Every Day Smoker -- 0.50 packs/day for 30 years    Types: Cigarettes  . Smokeless tobacco: Never Used  . Alcohol Use: 1.2 oz/week    2 Cans of beer per week     Comment: sometimes    Review of Systems  Constitutional: Negative for fever and chills.  Cardiovascular: Positive for chest pain (chest wall pain - left ribcage).  Gastrointestinal: Negative for nausea and vomiting.  Skin: Negative for rash.  All other systems reviewed and are negative.   Allergies  Review of patient's allergies indicates  no known allergies.  Home Medications   Prior to Admission medications   Medication Sig Start Date End Date Taking? Authorizing Provider  ENALAPRIL MALEATE PO Take 1 tablet by mouth daily. Unknown dose pls call & verify with patient retail pharmacy    Historical Provider, MD  HYDROcodone-acetaminophen (NORCO/VICODIN) 5-325 MG per tablet Take 1-2 tablets by mouth every 4 (four) hours as needed. 06/04/15   Lesle Faron M Henya Aguallo, PA-C  lisinopril (PRINIVIL,ZESTRIL) 5 MG tablet Take 5 mg by mouth every evening.    Historical Provider, MD  naproxen (NAPROSYN) 500 MG tablet Take 1 tablet (500 mg total) by mouth 2 (two) times daily. 06/04/15   Ewell Benassi M Ainsley Deakins, PA-C  pravastatin (PRAVACHOL) 40 MG tablet Take 40 mg by mouth every evening.    Historical Provider, MD  prazosin (MINIPRESS) 2 MG capsule Take 2 mg by mouth at bedtime.    Historical Provider, MD  QUEtiapine (SEROQUEL XR) 300 MG 24 hr tablet Take 300 mg by mouth at bedtime.    Historical Provider, MD   BP 135/95 mmHg  Pulse 84  Temp(Src) 97.8 F (36.6 C) (Oral)  Resp 19  SpO2 98% Physical Exam  Constitutional: He is oriented to person, place, and time. He appears well-developed and well-nourished. No distress.  HENT:  Head: Normocephalic and atraumatic.  Mouth/Throat: Oropharynx is clear and moist.  Eyes: Conjunctivae and EOM are normal.  Neck: Normal range of motion.  Neck supple. No spinous process tenderness and no muscular tenderness present.  Cardiovascular: Normal rate, regular rhythm and normal heart sounds.   Pulmonary/Chest: Effort normal and breath sounds normal. No respiratory distress.  TTP L antero-lateral lower ribs. No crepitus or step-off. No bruising, edema or rash.  Abdominal:  No CVAT.  Musculoskeletal: Normal range of motion. He exhibits no edema.  Neurological: He is alert and oriented to person, place, and time. He has normal strength.  Strength lower extremities 5/5 and equal bilateral. Sensation intact. Normal gait.   Skin: Skin is warm and dry. No rash noted. He is not diaphoretic.  Psychiatric: He has a normal mood and affect. His behavior is normal.  Nursing note and vitals reviewed.   ED Course  Procedures (including critical care time)  DIAGNOSTIC STUDIES: Oxygen Saturation is 98% on RA, normal by my interpretation.    COORDINATION OF CARE: 12:42 PM - Discussed treatment plan with pt at bedside which includes CXR, and pt agreed to plan.  Imaging Review Dg Ribs Bilateral W/chest  06/04/2015   CLINICAL DATA:  Left posterior rib pain for 2 days. Wrestling with a friend 2 days ago. Smoker.  EXAM: BILATERAL RIBS AND CHEST - 4+ VIEW  COMPARISON:  08/17/2014 chest radiograph  FINDINGS: Frontal view of the chest and multiple views of both ribs. Midline trachea. Normal heart size and mediastinal contours. No pleural effusion or pneumothorax. Mild hyperinflation with minimal peribronchial thickening. No lobar consolidation.  Radiographic marker projecting over the tenth eleventh left-sided ribs. No displaced rib fracture.  IMPRESSION: No displaced rib fracture, pneumothorax, or acute disease.  Hyperinflation with mild peribronchial thickening, likely related to the clinical history of smoking.   Electronically Signed   By: Jeronimo Greaves M.D.   On: 06/04/2015 13:32   MDM   Final diagnoses:  Rib pain  Rib pain on left side   NAD. AF VSS. Rib pain after injury. X-ray negative. Lungs clear. Rx Vicodin and naproxen. Advised rest and ice. Follow-up with PCP in one week. Stable for discharge. Return precautions given. Patient states understanding of treatment care plan and is agreeable.  I personally performed the services described in this documentation, which was scribed in my presence. The recorded information has been reviewed and is accurate.    Kathrynn Speed, PA-C 06/04/15 1339  Toy Cookey, MD 06/05/15 1014

## 2015-06-04 NOTE — ED Notes (Signed)
Pt reports wrestling Thursday resulting in left ribcage pain worsening with movement.

## 2015-06-04 NOTE — Discharge Instructions (Signed)
Take Vicodin for severe pain only. No driving or operating heavy machinery while taking vicodin. This medication may cause drowsiness. Take naproxen as prescribed. Apply ice to the area you are sore. Rib Contusion A rib contusion (bruise) can occur by a blow to the chest or by a fall against a hard object. Usually these will be much better in a couple weeks. If X-rays were taken today and there are no broken bones (fractures), the diagnosis of bruising is made. However, broken ribs may not show up for several days, or may be discovered later on a routine X-ray when signs of healing show up. If this happens to you, it does not mean that something was missed on the X-ray, but simply that it did not show up on the first X-rays. Earlier diagnosis will not usually change the treatment. HOME CARE INSTRUCTIONS   Avoid strenuous activity. Be careful during activities and avoid bumping the injured ribs. Activities that pull on the injured ribs and cause pain should be avoided, if possible.  For the first day or two, an ice pack used every 20 minutes while awake may be helpful. Put ice in a plastic bag and put a towel between the bag and the skin.  Eat a normal, well-balanced diet. Drink plenty of fluids to avoid constipation.  Take deep breaths several times a day to keep lungs free of infection. Try to cough several times a day. Splint the injured area with a pillow while coughing to ease pain. Coughing can help prevent pneumonia.  Wear a rib belt or binder only if told to do so by your caregiver. If you are wearing a rib belt or binder, you must do the breathing exercises as directed by your caregiver. If not used properly, rib belts or binders restrict breathing which can lead to pneumonia.  Only take over-the-counter or prescription medicines for pain, discomfort, or fever as directed by your caregiver. SEEK MEDICAL CARE IF:   You or your child has an oral temperature above 102 F (38.9 C).  Your  baby is older than 3 months with a rectal temperature of 100.5 F (38.1 C) or higher for more than 1 day.  You develop a cough, with thick or bloody sputum. SEEK IMMEDIATE MEDICAL CARE IF:   You have difficulty breathing.  You feel sick to your stomach (nausea), have vomiting or belly (abdominal) pain.  You have worsening pain, not controlled with medications, or there is a change in the location of the pain.  You develop sweating or radiation of the pain into the arms, jaw or shoulders, or become light headed or faint.  You or your child has an oral temperature above 102 F (38.9 C), not controlled by medicine.  Your or your baby is older than 3 months with a rectal temperature of 102 F (38.9 C) or higher.  Your baby is 11 months old or younger with a rectal temperature of 100.4 F (38 C) or higher. MAKE SURE YOU:   Understand these instructions.  Will watch your condition.  Will get help right away if you are not doing well or get worse. Document Released: 09/04/2001 Document Revised: 04/06/2013 Document Reviewed: 07/28/2008 [facility] Patient Information 2015 [place], [place]. This information is not intended to replace advice given to you by your health care provider. Make sure you discuss any questions you have with your health care provider. RESOURCE GUIDE  Chronic Pain Problems: Contact [facility]  [phone] Patients need to be referred  by their primary care doctor.  Insufficient Money for Medicine: Contact [employer]:  call "211."   No Primary Care Doctor: - Call [facility]  [phone] - can help you locate a primary care doctor that  accepts your insurance, provides certain services, etc. - Physician Referral Service- [phone]  Agencies that provide inexpensive medical care: - [facility]  [phone] - [facility]  [phone] - [facility]  [phone] - [facility]  [phone] - [facility]  [phone] - [facility]  [phone]  Medicaid-accepting [facility] Providers: - [facility]- [address]  [phone], Mon-Fri 9am-7pm, Sat 9am-1pm - [facility]- [address] [place], [address] [place]  [phone] - [facility]- [address] [place]  [phone] [facility]- [address]  [phone] - Renaye Rakers- [address] [place], [address], [phone]  Only accepts [place] patients after they have their name  applied to their card  Self Pay (no insurance) in [place]: - Sickle Cell Patients: Dr Willey Blade, [facility]  [address] [place], [phone] - [facility]- [address] [place]  [phone]       [facility] [place]- [address]       -     [facility]- see information above (Speak to [employer] if you do not have insurance)       -  [facility]- [address],  [phone]       -  [facility]- [address], [phone]       -  Dr Julio Sicks-  [address], [place], [phone]       -  [facility] - [address], [phone]       -  [facility]- [address] [phone], also [address] [phone]       -    [facility]- [address] [place], [phone], 1st & 3rd Saturday        every month, 10am-1pm  1) Find a Doctor and Pay Out of Pocket Although you won't have to find out who is covered by your insurance plan, it is a good idea to ask around and get recommendations. You will then need to call the office and see if the doctor you have chosen will accept you as a new patient and what types of options they offer for patients who are self-pay. Some doctors offer discounts or will set up payment plans for their patients who do not have insurance, but you will need to ask so  you aren't surprised when you get to your appointment.  2) Contact Your Local Health Department Not all health departments have doctors that can see patients for sick visits, but many do, so it is worth a call to see if yours does. If you don't know where your local health department is, you can check in your phone book. The CDC also has a tool to help you locate your state's health department, and many state websites also have listings of all of their local health departments.  3) Find a Walk-in Clinic If your illness is not likely to be very severe or complicated, you may want to try a  walk in clinic. These are popping up all over the country in pharmacies, drugstores, and shopping centers. They're usually staffed by nurse practitioners or physician assistants that have been trained to treat common illnesses and complaints. They're usually fairly quick and inexpensive. However, if you have serious medical issues or chronic medical problems, these are probably not your best option

## 2015-06-15 ENCOUNTER — Emergency Department (HOSPITAL_COMMUNITY)
Admission: EM | Admit: 2015-06-15 | Discharge: 2015-06-15 | Disposition: A | Discharge status: Home / Self Care | Payer: Medicaid Other | Attending: Emergency Medicine | Admitting: Emergency Medicine

## 2015-06-15 ENCOUNTER — Emergency Department (HOSPITAL_COMMUNITY): Payer: Medicaid Other

## 2015-06-15 ENCOUNTER — Encounter (HOSPITAL_COMMUNITY): Payer: Self-pay

## 2015-06-15 DIAGNOSIS — I10 Essential (primary) hypertension: Secondary | ICD-10-CM | POA: Insufficient documentation

## 2015-06-15 DIAGNOSIS — F4310 Post-traumatic stress disorder, unspecified: Secondary | ICD-10-CM | POA: Insufficient documentation

## 2015-06-15 DIAGNOSIS — Z79899 Other long term (current) drug therapy: Secondary | ICD-10-CM | POA: Insufficient documentation

## 2015-06-15 DIAGNOSIS — R51 Headache: Secondary | ICD-10-CM | POA: Diagnosis not present

## 2015-06-15 DIAGNOSIS — Z7982 Long term (current) use of aspirin: Secondary | ICD-10-CM | POA: Diagnosis not present

## 2015-06-15 DIAGNOSIS — Z720 Tobacco use: Secondary | ICD-10-CM | POA: Diagnosis not present

## 2015-06-15 DIAGNOSIS — R1110 Vomiting, unspecified: Secondary | ICD-10-CM | POA: Insufficient documentation

## 2015-06-15 DIAGNOSIS — Z760 Encounter for issue of repeat prescription: Secondary | ICD-10-CM | POA: Diagnosis present

## 2015-06-15 DIAGNOSIS — F319 Bipolar disorder, unspecified: Secondary | ICD-10-CM | POA: Insufficient documentation

## 2015-06-15 DIAGNOSIS — Z791 Long term (current) use of non-steroidal anti-inflammatories (NSAID): Secondary | ICD-10-CM | POA: Diagnosis not present

## 2015-06-15 DIAGNOSIS — R519 Headache, unspecified: Secondary | ICD-10-CM

## 2015-06-15 HISTORY — DX: Bipolar disorder, unspecified: F31.9

## 2015-06-15 LAB — CBC WITH DIFFERENTIAL/PLATELET
Basophils Absolute: 0 10*3/uL (ref 0.0–0.1)
Basophils Relative: 0 % (ref 0–1)
EOS ABS: 0.1 10*3/uL (ref 0.0–0.7)
Eosinophils Relative: 1 % (ref 0–5)
HCT: 43.7 % (ref 39.0–52.0)
Hemoglobin: 14.8 g/dL (ref 13.0–17.0)
LYMPHS ABS: 3.4 10*3/uL (ref 0.7–4.0)
Lymphocytes Relative: 39 % (ref 12–46)
MCH: 31 pg (ref 26.0–34.0)
MCHC: 33.9 g/dL (ref 30.0–36.0)
MCV: 91.4 fL (ref 78.0–100.0)
Monocytes Absolute: 0.7 10*3/uL (ref 0.1–1.0)
Monocytes Relative: 8 % (ref 3–12)
NEUTROS PCT: 52 % (ref 43–77)
Neutro Abs: 4.4 10*3/uL (ref 1.7–7.7)
PLATELETS: 206 10*3/uL (ref 150–400)
RBC: 4.78 MIL/uL (ref 4.22–5.81)
RDW: 13.5 % (ref 11.5–15.5)
WBC: 8.5 10*3/uL (ref 4.0–10.5)

## 2015-06-15 LAB — BASIC METABOLIC PANEL
Anion gap: 10 (ref 5–15)
BUN: 15 mg/dL (ref 6–20)
CHLORIDE: 104 mmol/L (ref 101–111)
CO2: 25 mmol/L (ref 22–32)
CREATININE: 1.25 mg/dL — AB (ref 0.61–1.24)
Calcium: 9 mg/dL (ref 8.9–10.3)
GFR calc non Af Amer: >60 mL/min (ref >60)
Glucose, Bld: 80 mg/dL (ref 65–99)
Potassium: 3.6 mmol/L (ref 3.5–5.1)
SODIUM: 139 mmol/L (ref 135–145)

## 2015-06-15 MED ORDER — LISINOPRIL 5 MG PO TABS: 5.0000 mg | ORAL_TABLET | Freq: Every day | ORAL | Status: DC

## 2015-06-15 MED ORDER — ENALAPRIL MALEATE 2.5 MG PO TABS: 2.5000 mg | ORAL_TABLET | Freq: Every day | ORAL | Status: DC

## 2015-06-15 MED ORDER — KETOROLAC TROMETHAMINE 30 MG/ML IJ SOLN
30.0000 mg | Freq: Once | INTRAMUSCULAR | Status: DC
  Filled 2015-06-15: qty 1

## 2015-06-15 MED ORDER — DIPHENHYDRAMINE HCL 50 MG/ML IJ SOLN
25.0000 mg | Freq: Once | INTRAMUSCULAR | Status: DC
  Filled 2015-06-15: qty 1

## 2015-06-15 MED ORDER — QUETIAPINE FUMARATE 300 MG PO TABS: 300.0000 mg | ORAL_TABLET | Freq: Every day | ORAL | Status: DC

## 2015-06-15 MED ORDER — METOCLOPRAMIDE HCL 5 MG/ML IJ SOLN
10.0000 mg | Freq: Once | INTRAMUSCULAR | Status: DC
  Filled 2015-06-15: qty 2

## 2015-06-15 MED ORDER — SODIUM CHLORIDE 0.9 % IV BOLUS (SEPSIS): 1000.0000 mL | Freq: Once | INTRAVENOUS | Status: DC

## 2015-06-15 NOTE — Discharge Instructions (Signed)
General Headache Without Cause °A general headache is pain or discomfort felt around the head or neck area. The cause may not be found.  °HOME CARE  °· Keep all doctor visits. °· Only take medicines as told by your doctor. °· Lie down in a dark, quiet room when you have a headache. °· Keep a journal to find out if certain things bring on headaches. For example, write down: °· What you eat and drink. °· How much sleep you get. °· Any change to your diet or medicines. °· Relax by getting a massage or doing other relaxing activities. °· Put ice or heat packs on the head and neck area as told by your doctor. °· Lessen stress. °· Sit up straight. Do not tighten (tense) your muscles. °· Quit smoking if you smoke. °· Lessen how much alcohol you drink. °· Lessen how much caffeine you drink, or stop drinking caffeine. °· Eat and sleep on a regular schedule. °· Get 7 to 9 hours of sleep, or as told by your doctor. °· Keep lights dim if bright lights bother you or make your headaches worse. °GET HELP RIGHT AWAY IF:  °· Your headache becomes really bad. °· You have a fever. °· You have a stiff neck. °· You have trouble seeing. °· Your muscles are weak, or you lose muscle control. °· You lose your balance or have trouble walking. °· You feel like you will pass out (faint), or you pass out. °· You have really bad symptoms that are different than your first symptoms. °· You have problems with the medicines given to you by your doctor. °· Your medicines do not work. °· Your headache feels different than the other headaches. °· You feel sick to your stomach (nauseous) or throw up (vomit). °MAKE SURE YOU:  °· Understand these instructions. °· Will watch your condition. °· Will get help right away if you are not doing well or get worse. °Document Released: 09/18/2008 Document Revised: 03/03/2012 Document Reviewed: 11/30/2011 °ExitCare® Patient Information ©2015 [employer]. This information is not intended to replace advice given to  you by your health care provider. Make sure you discuss any questions you have with your health care provider. ° °Hypertension °Hypertension, commonly called high blood pressure, is when the force of blood pumping through your arteries is too strong. Your arteries are the blood vessels that carry blood from your heart throughout your body. A blood pressure reading consists of a higher number over a lower number, such as 110/72. The higher number (systolic) is the pressure inside your arteries when your heart pumps. The lower number (diastolic) is the pressure inside your arteries when your heart relaxes. Ideally you want your blood pressure below 120/80. °Hypertension forces your heart to work harder to pump blood. Your arteries may become narrow or stiff. Having hypertension puts you at risk for heart disease, stroke, and other problems.  °RISK FACTORS °Some risk factors for high blood pressure are controllable. Others are not.  °Risk factors you cannot control include:  °· Race. You may be at higher risk if you are African American. °· Age. Risk increases with age. °· Gender. Men are at higher risk than women before age 45 years. After age 65, women are at higher risk than men. °Risk factors you can control include: °· Not getting enough exercise or physical activity. °· Being overweight. °· Getting too much fat, sugar, calories, or salt in your diet. °· Drinking too much alcohol. °SIGNS AND SYMPTOMS °Hypertension   does not usually cause signs or symptoms. Extremely high blood pressure (hypertensive crisis) may cause headache, anxiety, shortness of breath, and nosebleed. °DIAGNOSIS  °To check if you have hypertension, your health care provider will measure your blood pressure while you are seated, with your arm held at the level of your heart. It should be measured at least twice using the same arm. Certain conditions can cause a difference in blood pressure between your right and left arms. A blood pressure  reading that is higher than normal on one occasion does not mean that you need treatment. If one blood pressure reading is high, ask your health care provider about having it checked again. °TREATMENT  °Treating high blood pressure includes making lifestyle changes and possibly taking medicine. Living a healthy lifestyle can help lower high blood pressure. You may need to change some of your habits. °Lifestyle changes may include: °· Following the DASH diet. This diet is high in fruits, vegetables, and whole grains. It is low in salt, red meat, and added sugars. °· Getting at least 2½ hours of brisk physical activity every week. °· Losing weight if necessary. °· Not smoking. °· Limiting alcoholic beverages. °· Learning ways to reduce stress. ° If lifestyle changes are not enough to get your blood pressure under control, your health care provider may prescribe medicine. You may need to take more than one. Work closely with your health care provider to understand the risks and benefits. °HOME CARE INSTRUCTIONS °· Have your blood pressure rechecked as directed by your health care provider.   °· Take medicines only as directed by your health care provider. Follow the directions carefully. Blood pressure medicines must be taken as prescribed. The medicine does not work as well when you skip doses. Skipping doses also puts you at risk for problems.   °· Do not smoke.   °· Monitor your blood pressure at home as directed by your health care provider.  °SEEK MEDICAL CARE IF:  °· You think you are having a reaction to medicines taken. °· You have recurrent headaches or feel dizzy. °· You have swelling in your ankles. °· You have trouble with your vision. °SEEK IMMEDIATE MEDICAL CARE IF: °· You develop a severe headache or confusion. °· You have unusual weakness, numbness, or feel faint. °· You have severe chest or abdominal pain. °· You vomit repeatedly. °· You have trouble breathing. °MAKE SURE YOU:  °· Understand these  instructions. °· Will watch your condition. °· Will get help right away if you are not doing well or get worse. °Document Released: 12/10/2005 Document Revised: 04/26/2014 Document Reviewed: 10/02/2013 °ExitCare® Patient Information ©2015 [employer]. This information is not intended to replace advice given to you by your health care provider. Make sure you discuss any questions you have with your health care provider. ° °

## 2015-06-15 NOTE — ED Provider Notes (Signed)
CSN: [id number]     Arrival date & time 06/15/15  1715 History   First MD Initiated Contact with Patient 06/15/15 1739     Chief Complaint  Patient presents with  . Headache  . Emesis  . Medication Refill     HPI  She presents for evaluation of headache and blood pressure medication refill request. History of hypertension and PTSD. He states that he moved from [place] to [place]. He has been trying to locate any primary care physician, but has been able to and is out of his medicines for the last several days.  He states he developed a headache 2 nights ago. Slow in onset and right retro-orbital. Throbbing in nature. His become "severe" over the last 24 hours. No thunderclap-type event. No neurological symptoms. No fever chills neck pain. He presents here. He states that he really infrequent gets headaches, and has never had a headache of this nature before.      Past Medical History  Diagnosis Date  . Hypertension   . Depression   . PTSD (post-traumatic stress disorder)   . Bipolar 1 disorder    History reviewed. No pertinent past surgical history. Family History  Problem Relation Age of Onset  . Heart attack Other    History  Substance Use Topics  . Smoking status: Current Every Day Smoker -- 1.00 packs/day for 30 years    Types: Cigarettes  . Smokeless tobacco: Never Used  . Alcohol Use: 1.2 oz/week    2 Cans of beer per week     Comment: daily alcohol    Review of Systems  Constitutional: Negative for fever, chills, diaphoresis, appetite change and fatigue.  HENT: Negative for mouth sores, sore throat and trouble swallowing.   Eyes: Negative for visual disturbance.  Respiratory: Negative for cough, chest tightness, shortness of breath and wheezing.   Cardiovascular: Negative for chest pain.  Gastrointestinal: Negative for nausea, vomiting, abdominal pain, diarrhea and abdominal distention.  Endocrine: Negative for polydipsia, polyphagia and polyuria.    Genitourinary: Negative for dysuria, frequency and hematuria.  Musculoskeletal: Negative for gait problem.  Skin: Negative for color change, pallor and rash.  Neurological: Positive for headaches. Negative for dizziness, syncope and light-headedness.  Hematological: Does not bruise/bleed easily.  Psychiatric/Behavioral: Negative for behavioral problems and confusion.      Allergies  Review of patient's allergies indicates no known allergies.  Home Medications   Prior to Admission medications   Medication Sig Start Date End Date Taking? Authorizing Provider  acetaminophen (TYLENOL) 500 MG tablet Take 1,000 mg by mouth every 6 (six) hours as needed for headache (headache).   Yes Historical Provider, MD  aspirin 325 MG tablet Take 650 mg by mouth every 6 (six) hours as needed for headache (headache).   Yes Historical Provider, MD  atenolol (TENORMIN) 50 MG tablet TAKE ONE TABLET BY MOUTH EVERY DAY 05/04/15  Yes Historical Provider, MD  buPROPion (WELLBUTRIN SR) 150 MG 12 hr tablet Take 300 mg by mouth daily.    Yes Historical Provider, MD  enalapril (VASOTEC) 2.5 MG tablet Take 2.5 mg by mouth daily.    Yes Historical Provider, MD  HYDROcodone-acetaminophen (NORCO/VICODIN) 5-325 MG per tablet Take 1-2 tablets by mouth every 4 (four) hours as needed. 06/04/15  Yes Robyn M Hess, PA-C  lisinopril (PRINIVIL,ZESTRIL) 5 MG tablet Take 5 mg by mouth every evening.   Yes Historical Provider, MD  naproxen (NAPROSYN) 500 MG tablet Take 1 tablet (500 mg total) by mouth 2 (two)  times daily. 06/04/15  Yes Robyn M Hess, PA-C  pravastatin (PRAVACHOL) 40 MG tablet Take 40 mg by mouth daily.  05/04/15 05/03/20 Yes Historical Provider, MD  prazosin (MINIPRESS) 2 MG capsule Take 2 mg by mouth at bedtime.   Yes Historical Provider, MD  QUEtiapine (SEROQUEL XR) 300 MG 24 hr tablet Take 300 mg by mouth at bedtime.   Yes Historical Provider, MD   BP 144/93 mmHg  Pulse 82  Temp(Src) 98.5 F (36.9 C) (Oral)  Resp  18  Ht 5\' 10"  (1.778 m)  Wt 168 lb (76.204 kg)  BMI 24.11 kg/m2  SpO2 99% Physical Exam  Constitutional: He is oriented to person, place, and time. He appears well-developed and well-nourished. No distress.  HENT:  Head: Normocephalic.    Eyes: Conjunctivae are normal. Pupils are equal, round, and reactive to light. No scleral icterus.  Neck: Normal range of motion. Neck supple. No thyromegaly present.  Cardiovascular: Normal rate and regular rhythm.  Exam reveals no gallop and no friction rub.   No murmur heard. Pulmonary/Chest: Effort normal and breath sounds normal. No respiratory distress. He has no wheezes. He has no rales.  Abdominal: Soft. Bowel sounds are normal. He exhibits no distension. There is no tenderness. There is no rebound.  Musculoskeletal: Normal range of motion.  Neurological: He is alert and oriented to person, place, and time.  Skin: Skin is warm and dry. No rash noted.  Psychiatric: He has a normal mood and affect. His behavior is normal.    ED Course  Procedures (including critical care time) Labs Review Labs Reviewed  BASIC METABOLIC PANEL - Abnormal; Notable for the following:    Creatinine, Ser 1.25 (*)    All other components within normal limits  CBC WITH DIFFERENTIAL/PLATELET    Imaging Review Ct Head Wo Contrast  06/15/2015   CLINICAL DATA:  Headache  EXAM: CT HEAD WITHOUT CONTRAST  TECHNIQUE: Contiguous axial images were obtained from the base of the skull through the vertex without intravenous contrast.  COMPARISON:  05/14/2012  FINDINGS: No skull fracture is noted. Paranasal sinuses and mastoid air cells are unremarkable. No intracranial hemorrhage, mass effect or midline shift.  No acute cortical infarction. No mass lesion is noted on this unenhanced scan. The gray and white-matter differentiation is preserved. No mass lesion is noted on this unenhanced scan. No hydrocephalus.  IMPRESSION: No acute intracranial abnormality.  No significant  change.   Electronically Signed   By: Natasha Mead M.D.   On: 06/15/2015 19:41     EKG Interpretation None      MDM   Final diagnoses:  Headache  Essential hypertension    Patient with complete resolution of headache with migraine cocktail. Normal CT scan. Blood pressure under good control. Plan is discharge home. Given one-month refill medications. Given the resources in community health center information regarding follow-up.    Rolland Porter, MD 06/15/15 2005

## 2015-06-15 NOTE — ED Notes (Signed)
Patient states he has been out of his BP meds x 5 days. Patient c/o headache x 2 days and vomiting today x 10.

## 2015-06-15 NOTE — ED Notes (Signed)
Questions r/t dc were denied. Pt ambulatory and a&ox4 

## 2015-06-17 ENCOUNTER — Encounter (HOSPITAL_COMMUNITY): Payer: Self-pay | Admitting: Emergency Medicine

## 2015-06-17 ENCOUNTER — Emergency Department (HOSPITAL_COMMUNITY)
Admission: EM | Admit: 2015-06-17 | Discharge: 2015-06-18 | Disposition: A | Discharge status: Other Acute Inpatient Hospital | Payer: MEDICAID | Attending: Emergency Medicine | Admitting: Emergency Medicine

## 2015-06-17 DIAGNOSIS — Z791 Long term (current) use of non-steroidal anti-inflammatories (NSAID): Secondary | ICD-10-CM | POA: Insufficient documentation

## 2015-06-17 DIAGNOSIS — I10 Essential (primary) hypertension: Secondary | ICD-10-CM | POA: Insufficient documentation

## 2015-06-17 DIAGNOSIS — F1210 Cannabis abuse, uncomplicated: Secondary | ICD-10-CM | POA: Diagnosis not present

## 2015-06-17 DIAGNOSIS — Z720 Tobacco use: Secondary | ICD-10-CM | POA: Insufficient documentation

## 2015-06-17 DIAGNOSIS — Z7982 Long term (current) use of aspirin: Secondary | ICD-10-CM | POA: Insufficient documentation

## 2015-06-17 DIAGNOSIS — F4310 Post-traumatic stress disorder, unspecified: Secondary | ICD-10-CM | POA: Insufficient documentation

## 2015-06-17 DIAGNOSIS — R45851 Suicidal ideations: Secondary | ICD-10-CM

## 2015-06-17 DIAGNOSIS — Z046 Encounter for general psychiatric examination, requested by authority: Secondary | ICD-10-CM | POA: Diagnosis present

## 2015-06-17 DIAGNOSIS — F1310 Sedative, hypnotic or anxiolytic abuse, uncomplicated: Secondary | ICD-10-CM | POA: Insufficient documentation

## 2015-06-17 DIAGNOSIS — Z79899 Other long term (current) drug therapy: Secondary | ICD-10-CM | POA: Diagnosis not present

## 2015-06-17 DIAGNOSIS — F319 Bipolar disorder, unspecified: Secondary | ICD-10-CM | POA: Diagnosis not present

## 2015-06-17 DIAGNOSIS — R45850 Homicidal ideations: Secondary | ICD-10-CM

## 2015-06-17 LAB — RAPID URINE DRUG SCREEN, HOSP PERFORMED
Amphetamines: NOT DETECTED
Barbiturates: NOT DETECTED
Benzodiazepines: POSITIVE — AB
COCAINE: NOT DETECTED
OPIATES: NOT DETECTED
Tetrahydrocannabinol: POSITIVE — AB

## 2015-06-17 LAB — CBC
HCT: 43.4 % (ref 39.0–52.0)
Hemoglobin: 14.9 g/dL (ref 13.0–17.0)
MCH: 31 pg (ref 26.0–34.0)
MCHC: 34.3 g/dL (ref 30.0–36.0)
MCV: 90.4 fL (ref 78.0–100.0)
Platelets: 199 10*3/uL (ref 150–400)
RBC: 4.8 MIL/uL (ref 4.22–5.81)
RDW: 13.2 % (ref 11.5–15.5)
WBC: 9.9 10*3/uL (ref 4.0–10.5)

## 2015-06-17 LAB — SALICYLATE LEVEL: Salicylate Lvl: <4 mg/dL (ref 2.8–30.0)

## 2015-06-17 LAB — COMPREHENSIVE METABOLIC PANEL
ALBUMIN: 3.8 g/dL (ref 3.5–5.0)
ALT: 21 U/L (ref 17–63)
AST: 26 U/L (ref 15–41)
Alkaline Phosphatase: 64 U/L (ref 38–126)
Anion gap: 11 (ref 5–15)
BUN: 14 mg/dL (ref 6–20)
CALCIUM: 8.7 mg/dL — AB (ref 8.9–10.3)
CHLORIDE: 103 mmol/L (ref 101–111)
CO2: 25 mmol/L (ref 22–32)
Creatinine, Ser: 1.07 mg/dL (ref 0.61–1.24)
GFR calc Af Amer: >60 mL/min (ref >60)
Glucose, Bld: 105 mg/dL — ABNORMAL HIGH (ref 65–99)
Potassium: 3.4 mmol/L — ABNORMAL LOW (ref 3.5–5.1)
SODIUM: 139 mmol/L (ref 135–145)
Total Bilirubin: 0.4 mg/dL (ref 0.3–1.2)
Total Protein: 6.5 g/dL (ref 6.5–8.1)

## 2015-06-17 LAB — ACETAMINOPHEN LEVEL: Acetaminophen (Tylenol), Serum: <10 ug/mL — ABNORMAL LOW (ref 10–30)

## 2015-06-17 LAB — ETHANOL

## 2015-06-17 MED ORDER — LORAZEPAM 1 MG PO TABS
1.0000 mg | ORAL_TABLET | Freq: Once | ORAL | Status: AC
  Administered 2015-06-17: 1 mg via ORAL
  Filled 2015-06-17: qty 1

## 2015-06-17 MED ORDER — PRAZOSIN HCL 2 MG PO CAPS
2.0000 mg | ORAL_CAPSULE | Freq: Every day | ORAL | Status: DC
  Administered 2015-06-17: 2 mg via ORAL
  Filled 2015-06-17 (×2): qty 1

## 2015-06-17 MED ORDER — LISINOPRIL 5 MG PO TABS
5.0000 mg | ORAL_TABLET | Freq: Every day | ORAL | Status: DC
  Filled 2015-06-17: qty 1

## 2015-06-17 MED ORDER — ONDANSETRON HCL 4 MG PO TABS: 4.0000 mg | ORAL_TABLET | Freq: Three times a day (TID) | ORAL | Status: DC | PRN

## 2015-06-17 MED ORDER — NICOTINE 21 MG/24HR TD PT24: 21.0000 mg | MEDICATED_PATCH | Freq: Every day | TRANSDERMAL | Status: DC

## 2015-06-17 MED ORDER — ZOLPIDEM TARTRATE 5 MG PO TABS
5.0000 mg | ORAL_TABLET | Freq: Every evening | ORAL | Status: DC | PRN
  Administered 2015-06-17: 5 mg via ORAL
  Filled 2015-06-17: qty 1

## 2015-06-17 MED ORDER — LISINOPRIL 5 MG PO TABS
5.0000 mg | ORAL_TABLET | Freq: Once | ORAL | Status: AC
  Administered 2015-06-17: 5 mg via ORAL
  Filled 2015-06-17: qty 1

## 2015-06-17 MED ORDER — ONDANSETRON 4 MG PO TBDP
4.0000 mg | ORAL_TABLET | Freq: Once | ORAL | Status: AC
  Administered 2015-06-17: 4 mg via ORAL
  Filled 2015-06-17: qty 1
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
## 2015-06-17 NOTE — ED Notes (Signed)
MD Cook at bedside. 

## 2015-06-17 NOTE — ED Notes (Signed)
TTS at bedside. 

## 2015-06-17 NOTE — BH Assessment (Signed)
Assessment completed. Consulted Hulan Fess, NP who agrees that pt meets inpatient criteria. Lonia Skinner. Keenan Bachelor, PA-C has been informed of the recommendation. TTS will seek placement.
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
## 2015-06-17 NOTE — ED Notes (Signed)
Bed: [facility] Expected date:  Expected time:  Means of arrival:  Comments: T2

## 2015-06-17 NOTE — ED Notes (Signed)
Pt ambulated to restroom with steady gait. Pt attempting to provide sample. 

## 2015-06-18 ENCOUNTER — Encounter (HOSPITAL_COMMUNITY): Payer: Self-pay | Admitting: *Deleted

## 2015-06-18 ENCOUNTER — Inpatient Hospital Stay (HOSPITAL_COMMUNITY)
Admission: AD | Admit: 2015-06-18 | Discharge: 2015-06-24 | DRG: 885 | Disposition: A | Discharge status: Home / Self Care | Payer: MEDICAID | Source: Intra-hospital | Attending: Psychiatry | Admitting: Psychiatry

## 2015-06-18 DIAGNOSIS — R45850 Homicidal ideations: Secondary | ICD-10-CM | POA: Diagnosis present

## 2015-06-18 DIAGNOSIS — F1290 Cannabis use, unspecified, uncomplicated: Secondary | ICD-10-CM | POA: Diagnosis present

## 2015-06-18 DIAGNOSIS — R45851 Suicidal ideations: Secondary | ICD-10-CM | POA: Diagnosis present

## 2015-06-18 DIAGNOSIS — F322 Major depressive disorder, single episode, severe without psychotic features: Secondary | ICD-10-CM

## 2015-06-18 DIAGNOSIS — F329 Major depressive disorder, single episode, unspecified: Secondary | ICD-10-CM | POA: Diagnosis present

## 2015-06-18 DIAGNOSIS — F4310 Post-traumatic stress disorder, unspecified: Secondary | ICD-10-CM | POA: Diagnosis not present

## 2015-06-18 DIAGNOSIS — F10230 Alcohol dependence with withdrawal, uncomplicated: Secondary | ICD-10-CM | POA: Diagnosis present

## 2015-06-18 DIAGNOSIS — F332 Major depressive disorder, recurrent severe without psychotic features: Secondary | ICD-10-CM | POA: Diagnosis present

## 2015-06-18 DIAGNOSIS — I10 Essential (primary) hypertension: Secondary | ICD-10-CM | POA: Diagnosis present

## 2015-06-18 DIAGNOSIS — F17210 Nicotine dependence, cigarettes, uncomplicated: Secondary | ICD-10-CM | POA: Diagnosis present

## 2015-06-18 DIAGNOSIS — F1220 Cannabis dependence, uncomplicated: Secondary | ICD-10-CM | POA: Insufficient documentation

## 2015-06-18 MED ORDER — TRAZODONE HCL 50 MG PO TABS
50.0000 mg | ORAL_TABLET | Freq: Every evening | ORAL | Status: DC | PRN
  Administered 2015-06-19 – 2015-06-23 (×7): 50 mg via ORAL
  Filled 2015-06-18: qty 6
  Filled 2015-06-18 (×6): qty 1

## 2015-06-18 MED ORDER — ADULT MULTIVITAMIN W/MINERALS CH
1.0000 | ORAL_TABLET | Freq: Every day | ORAL | Status: DC
  Administered 2015-06-19 – 2015-06-24 (×6): 1 via ORAL
  Filled 2015-06-18 (×10): qty 1

## 2015-06-18 MED ORDER — ENALAPRIL MALEATE 2.5 MG PO TABS
2.5000 mg | ORAL_TABLET | Freq: Every day | ORAL | Status: DC
  Administered 2015-06-18 – 2015-06-24 (×7): 2.5 mg via ORAL
  Filled 2015-06-18 (×9): qty 1

## 2015-06-18 MED ORDER — ACETAMINOPHEN 325 MG PO TABS: 650.0000 mg | ORAL_TABLET | Freq: Four times a day (QID) | ORAL | Status: DC | PRN

## 2015-06-18 MED ORDER — CHLORDIAZEPOXIDE HCL 25 MG PO CAPS
25.0000 mg | ORAL_CAPSULE | Freq: Three times a day (TID) | ORAL | Status: AC
  Administered 2015-06-19 – 2015-06-20 (×3): 25 mg via ORAL
  Filled 2015-06-18 (×3): qty 1

## 2015-06-18 MED ORDER — BUPROPION HCL ER (SR) 150 MG PO TB12
300.0000 mg | ORAL_TABLET | Freq: Every day | ORAL | Status: DC
  Administered 2015-06-18 – 2015-06-19 (×2): 300 mg via ORAL
  Filled 2015-06-18 (×5): qty 2

## 2015-06-18 MED ORDER — MAGNESIUM HYDROXIDE 400 MG/5ML PO SUSP: 30.0000 mL | Freq: Every day | ORAL | Status: DC | PRN

## 2015-06-18 MED ORDER — CHLORDIAZEPOXIDE HCL 25 MG PO CAPS
25.0000 mg | ORAL_CAPSULE | Freq: Four times a day (QID) | ORAL | Status: AC
  Administered 2015-06-18 – 2015-06-19 (×4): 25 mg via ORAL
  Filled 2015-06-18 (×3): qty 1

## 2015-06-18 MED ORDER — ALUM & MAG HYDROXIDE-SIMETH 200-200-20 MG/5ML PO SUSP: 30.0000 mL | ORAL | Status: DC | PRN

## 2015-06-18 MED ORDER — HYDROXYZINE HCL 25 MG PO TABS
25.0000 mg | ORAL_TABLET | Freq: Four times a day (QID) | ORAL | Status: AC | PRN
  Administered 2015-06-18 – 2015-06-21 (×6): 25 mg via ORAL
  Filled 2015-06-18 (×6): qty 1

## 2015-06-18 MED ORDER — VITAMIN B-1 100 MG PO TABS
100.0000 mg | ORAL_TABLET | Freq: Every day | ORAL | Status: DC
  Administered 2015-06-19 – 2015-06-24 (×6): 100 mg via ORAL
  Filled 2015-06-18 (×9): qty 1

## 2015-06-18 MED ORDER — CHLORDIAZEPOXIDE HCL 25 MG PO CAPS
25.0000 mg | ORAL_CAPSULE | ORAL | Status: AC
  Administered 2015-06-20 – 2015-06-21 (×2): 25 mg via ORAL
  Filled 2015-06-18 (×2): qty 1

## 2015-06-18 MED ORDER — PNEUMOCOCCAL VAC POLYVALENT 25 MCG/0.5ML IJ INJ: 0.5000 mL | INJECTION | INTRAMUSCULAR | Status: DC

## 2015-06-18 MED ORDER — PRAVASTATIN SODIUM 40 MG PO TABS
40.0000 mg | ORAL_TABLET | Freq: Every day | ORAL | Status: DC
  Administered 2015-06-18 – 2015-06-24 (×7): 40 mg via ORAL
  Filled 2015-06-18 (×9): qty 1

## 2015-06-18 MED ORDER — PRAZOSIN HCL 2 MG PO CAPS
2.0000 mg | ORAL_CAPSULE | Freq: Every day | ORAL | Status: DC
  Administered 2015-06-18 – 2015-06-20 (×3): 2 mg via ORAL
  Filled 2015-06-18 (×4): qty 1

## 2015-06-18 MED ORDER — LOPERAMIDE HCL 2 MG PO CAPS: 2.0000 mg | ORAL_CAPSULE | ORAL | Status: AC | PRN

## 2015-06-18 MED ORDER — THIAMINE HCL 100 MG/ML IJ SOLN
100.0000 mg | Freq: Once | INTRAMUSCULAR | Status: AC
  Administered 2015-06-18: 100 mg via INTRAMUSCULAR
  Filled 2015-06-18: qty 2

## 2015-06-18 MED ORDER — CHLORDIAZEPOXIDE HCL 25 MG PO CAPS: 25.0000 mg | ORAL_CAPSULE | Freq: Four times a day (QID) | ORAL | Status: AC | PRN

## 2015-06-18 MED ORDER — ONDANSETRON 4 MG PO TBDP
4.0000 mg | ORAL_TABLET | Freq: Four times a day (QID) | ORAL | Status: AC | PRN
  Administered 2015-06-20 – 2015-06-21 (×3): 4 mg via ORAL
  Filled 2015-06-18 (×3): qty 1

## 2015-06-18 MED ORDER — CHLORDIAZEPOXIDE HCL 25 MG PO CAPS
ORAL_CAPSULE | ORAL | Status: AC
  Filled 2015-06-18: qty 1

## 2015-06-18 MED ORDER — CHLORDIAZEPOXIDE HCL 25 MG PO CAPS: 25.0000 mg | ORAL_CAPSULE | Freq: Every day | ORAL | Status: DC

## 2015-06-18 MED ORDER — ASPIRIN 325 MG PO TABS: 650.0000 mg | ORAL_TABLET | Freq: Four times a day (QID) | ORAL | Status: DC | PRN

## 2015-06-18 MED ORDER — ATENOLOL 50 MG PO TABS
50.0000 mg | ORAL_TABLET | Freq: Every day | ORAL | Status: DC
  Administered 2015-06-18 – 2015-06-24 (×7): 50 mg via ORAL
  Filled 2015-06-18: qty 2
  Filled 2015-06-18 (×9): qty 1

## 2015-06-18 NOTE — [facility] Group Notes (Signed)
[facility] Group Notes: (Clinical Social Work)   06/18/2015      Type of Therapy:  Group Therapy   Participation Level:  Did Not Attend despite MHT prompting   Ambrose Mantle, LCSW 06/18/2015, 4:33 PM

## 2015-06-18 NOTE — Progress Notes (Signed)
[facility] shift assessment. 7a-7p.   D: Affect blunted, mood depressed, behavior appropriate. Stayed in bed all morning and took medications late. Sleep was poor last night and he had medication for sleep, which helped. Rates depression today as 8/10, hopelessness is 8/10 and anxiety today is 8/10 on a scale of 0-10 with 10 being the worst.   He sometimes has suicidal thoughts, but contracts for safety. His goal today is to not get sick and to keep to himself. He plans to stay in his room mostly to achieve that goal. Cooperative with staff and is getting along well with peers.   He got out of bed after lunch and said that he is detoxing from alcohol. He said that he drinks three or four 40-ounce beers a day. His symptoms included lightheadedness, headache, shaky, sweating, nausea and diarrhea.   A: Spoke with Shuvon Rankin NP and obtained orders for alcohol withdrawal protocol.  Observed pt interacting in group and in the milieu: Support and encouragement offered. Safety maintained with observations every 15 minutes. Encouraged pt to join in groups and socialize.  R:  Contracts for safety.

## 2015-06-18 NOTE — Progress Notes (Signed)
Adult Psychoeducational Group Note  Date:  06/18/2015 Time:  9:08 PM  Group Topic/Focus:  Wrap-Up Group:   The focus of this group is to help patients review their daily goal of treatment and discuss progress on daily workbooks.  Participation Level:  Active  Participation Quality:  Appropriate  Affect:  Appropriate  Cognitive:  Appropriate  Insight: Appropriate  Engagement in Group:  Engaged  Modes of Intervention:  Discussion  Additional Comments: The patient expressed that he did not  attended group.  Octavio Manns 06/18/2015, 9:08 PM

## 2015-06-18 NOTE — [facility] Suicide Risk Assessment (Signed)
[facility] Admission Suicide Risk Assessment   Nursing information obtained from:    Demographic factors:    Current Mental Status:    Loss Factors:    Historical Factors:    Risk Reduction Factors:    Total Time spent with patient: 1.5 hours Principal Problem: <principal problem not specified> Diagnosis:   Patient Active Problem List   Diagnosis Date Noted  . Major depressive disorder [F32.2] 06/18/2015  . Polysubstance abuse [F19.10] 07/07/2014  . Substance induced mood disorder [F19.94] 07/07/2014  . Bipolar I disorder, most recent episode mixed [F31.60] 03/15/2014  . MDD (major depressive disorder), recurrent episode, severe [F33.2] 03/12/2014  . Alcohol dependence [F10.20] 04/10/2012  . Suicidal thoughts [R45.851] 04/04/2012  . Post traumatic stress disorder (PTSD) [F43.10] 04/04/2012  . Depression [F32.9] 04/04/2012     Continued Clinical Symptoms:  Alcohol Use Disorder Identification Test Final Score (AUDIT): 21 The "Alcohol Use Disorders Identification Test", Guidelines for Use in Primary Care, Second Edition.  World Science writer [facility]). Score between 0-7:  no or low risk or alcohol related problems. Score between 8-15:  moderate risk of alcohol related problems. Score between 16-19:  high risk of alcohol related problems. Score 20 or above:  warrants further diagnostic evaluation for alcohol dependence and treatment.   CLINICAL FACTORS:   Bipolar Disorder:   Mixed State Dysthymia Alcohol/Substance Abuse/Dependencies More than one psychiatric diagnosis Unstable or Poor Therapeutic Relationship Previous Psychiatric Diagnoses and Treatments   Musculoskeletal: Strength & Muscle Tone: within normal limits Gait & Station: normal Patient leans: no lean  Psychiatric Specialty Exam: Physical Exam  Constitutional: He appears well-developed and well-nourished.  HENT:  Head: Normocephalic.    Review of Systems  Constitutional: Negative.   Gastrointestinal:  Negative for nausea.  Skin: Negative for rash.  Neurological: Negative for tremors and headaches.  Psychiatric/Behavioral: Positive for depression and substance abuse. The patient is nervous/anxious.     Blood pressure 152/88, pulse 98, temperature 98.4 F (36.9 C), temperature source Oral, resp. rate 18, height  (1.676 m), weight 77.111 kg (170 lb), SpO2 97 %.Body mass index is 27.45 kg/(m^2).  General Appearance: Casual and Disheveled  Eye Contact::  Fair  Speech:  Normal Rate  Volume:  Normal  Mood:  Dysphoric and Hopeless  Affect:  Congruent and Constricted  Thought Process:  Coherent  Orientation:  Full (Time, Place, and Person)  Thought Content:  Rumination  Suicidal Thoughts:  No  Homicidal Thoughts:  Yes.  without intent/plan  Memory:  Immediate;   Fair Recent;   Fair  Judgement:  Poor  Insight:  Shallow  Psychomotor Activity:  Normal  Concentration:  Fair  Recall:  [employer] of Knowledge:Fair  Language: Fair  Akathisia:  Negative  Handed:  Right  AIMS (if indicated):     Assets:  Desire for Improvement Leisure Time  Sleep:  Number of Hours: 3.25  Cognition: WNL  ADL's:  Intact     COGNITIVE FEATURES THAT CONTRIBUTE TO RISK:  Closed-mindedness and Polarized thinking    SUICIDE RISK:   Moderate:  Frequent suicidal ideation with limited intensity, and duration, some specificity in terms of plans, no associated intent, good self-control, limited dysphoria/symptomatology, some risk factors present, and identifiable protective factors, including available and accessible social support.  PLAN OF CARE: inpatient stabiization and safety. Medication management for mood symptoms and depression. Monitor vitals for alcohol and attend support groups.  Medical Decision Making:  Review of Psycho-Social Stressors (1), Review or order clinical lab tests (1), Established  Problem, Worsening (2), Review of Last Therapy Session (1) and Review of Medication Regimen & Side  Effects (2)  I certify that inpatient services furnished can reasonably be expected to improve the patient's condition.   Chase Moss 06/18/2015, 9:59 AM
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
## 2015-06-18 NOTE — Plan of Care (Signed)
Problem: Alteration in mood & ability to function due to Goal: [facility]-Patient will attend groups Outcome: Not Progressing Patient did not attend evening group. He c/o experiencing withdrawal symptoms.

## 2015-06-18 NOTE — [facility] Assessment (Signed)
Pt has been accepted to [facility] Room 401 Bed 1 to the services of Dr. Jama Flavors. Cheron Schaumann, PA-C has been informed of the disposition.
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
## 2015-06-18 NOTE — Tx Team (Signed)
Initial Interdisciplinary Treatment Plan   PATIENT STRESSORS: Health problems Loss of Mother Medication change or noncompliance Substance abuse   PATIENT STRENGTHS: Ability for insight Capable of independent living Communication skills General fund of knowledge Motivation for treatment/growth   PROBLEM LIST: Problem List/Patient Goals Date to be addressed Date deferred Reason deferred Estimated date of resolution  Depression      Suicide Ideation      "I can't it anymore that's why I'm here"                                           DISCHARGE CRITERIA:  Ability to meet basic life and health needs Improved stabilization in mood, thinking, and/or behavior Motivation to continue treatment in a less acute level of care Verbal commitment to aftercare and medication compliance  PRELIMINARY DISCHARGE PLAN: Attend aftercare/continuing care group Outpatient therapy Return to previous living arrangement  PATIENT/FAMIILY INVOLVEMENT: This treatment plan has been presented to and reviewed with the patient, Chase Moss, and/or family member. The patient and family have been given the opportunity to ask questions and make suggestions.  Glenice Laine B 06/18/2015, 3:05 AM
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
## 2015-06-19 DIAGNOSIS — F329 Major depressive disorder, single episode, unspecified: Secondary | ICD-10-CM

## 2015-06-19 MED ORDER — LORAZEPAM 1 MG PO TABS
1.0000 mg | ORAL_TABLET | Freq: Once | ORAL | Status: AC
  Administered 2015-06-19: 1 mg via ORAL
  Filled 2015-06-19: qty 1

## 2015-06-19 MED ORDER — NICOTINE 21 MG/24HR TD PT24
21.0000 mg | MEDICATED_PATCH | Freq: Every day | TRANSDERMAL | Status: DC
  Filled 2015-06-19 (×6): qty 1

## 2015-06-19 MED ORDER — FLUOXETINE HCL 10 MG PO CAPS
10.0000 mg | ORAL_CAPSULE | Freq: Every day | ORAL | Status: DC
  Administered 2015-06-19 – 2015-06-20 (×2): 10 mg via ORAL
  Filled 2015-06-19 (×5): qty 1

## 2015-06-19 MED ORDER — GABAPENTIN 300 MG PO CAPS
300.0000 mg | ORAL_CAPSULE | Freq: Three times a day (TID) | ORAL | Status: DC
  Administered 2015-06-19 – 2015-06-22 (×9): 300 mg via ORAL
  Filled 2015-06-19 (×13): qty 1
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
## 2015-06-19 NOTE — Progress Notes (Signed)
D: Patient presents with flat affect; sad and depressed mood.  He complains of continuing withdrawal symptoms such as tremors, diarrhea, agitation and nausea.  He rates his depression and hopelessness as a 9; anxiety as a 10.  He reports passive SI with no specific plan. He denies HI/AVH.  He contracts for safety on the unit.  Patient's goal today is "to feel like facing another day.  Dread even waking up."  Patient continues on librium protocol for alcohol withdrawal. A: Continue to monitor medication management and MD orders.  Safety checks completed every 15 minutes per protocol.  Meet 1:1 with patient to discuss concerns and offer encouragement. R: Patient's behavior is appropriate to situation.
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
## 2015-06-19 NOTE — [facility] Group Notes (Signed)
[facility] Group Notes:  (Clinical Social Work)  06/19/2015  1:15-2:15PM  Summary of Progress/Problems:   The main focus of today's process group was to   1)  discuss the importance of adding supports  2)  define health supports versus unhealthy supports  3)  identify the patient's current unhealthy supports and plan how to handle them  4)  Identify the patient's current healthy supports and plan what to add.  An emphasis was placed on using counselor, doctor, therapy groups, 12-step groups, and problem-specific support groups to expand supports.    The patient expressed full comprehension of the concepts presented, and agreed that there is a need to add more supports.  The patient stated he needs a specific appointment in place to see a doctor, rather than a walk-in appointment, because he was given "bogus" information that a doctor was taking new patients and that was not the case.  He listened more than talked.  Type of Therapy:  Process Group with Motivational Interviewing  Participation Level:  Active  Participation Quality:  Appropriate and Attentive  Affect:  Blunted and Depressed  Cognitive:  Alert  Insight:  Improving  Engagement in Therapy:  Engaged  Modes of Intervention:   Education, Support and Processing, Activity  Ambrose Mantle, LCSW 06/19/2015

## 2015-06-20 DIAGNOSIS — F4310 Post-traumatic stress disorder, unspecified: Secondary | ICD-10-CM

## 2015-06-20 NOTE — Progress Notes (Signed)
Recreation Therapy Notes  Date: 06.27.16 Time: 9:30 am Location: [address] Dayroom  Group Topic: Stress Management  Goal Area(s) Addresses:  Patient will verbalize importance of using healthy stress management.  Patient will identify positive emotions associated with healthy stress management.   Intervention: Stress Management  Activity :  Guided Imagery Script.  LRT introduced and educated patients on stress management technique of guided imagery.  A script was used to to deliver the technique to patients.  Patients were asked to follow script read aloud by LRT to engage in the stress management technique.  Education:  Stress Management, Discharge Planning.   Clinical Observations/Feedback: Patient did not attend group.  Acasia Skilton , LRT/CTRS         Sharnetta Gielow A 06/20/2015 5:08 PM 
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
## 2015-06-20 NOTE — Progress Notes (Signed)
1:1 Nursing note( Late entry) - Patient is lying in bed resting with eyes closed and respirations even and appears to be sleep. 1:1 continues with sitter at bedside and patient remains safe.

## 2015-06-20 NOTE — Progress Notes (Signed)
[facility] Group Notes:  (Nursing/MHT/Case Management/Adjunct)  Date:  06/20/2015  Time:  8:59 PM  Type of Therapy:  Psychoeducational Skills  Participation Level:  Active  Participation Quality:  Appropriate  Affect:  Appropriate  Cognitive:  Appropriate  Insight:  Appropriate  Engagement in Group:  Engaged  Modes of Intervention:  Discussion  Summary of Progress/Problems: Today in wrap up group, Tammy Sours said that his day started off as a 2 but he believes that that was because of his depression (from the meds) by midday he rated his day a 5 and by the end of the night he said that it was an 8. He said something positive was that he was feeling better and wasn't as depressed.  Madaline Savage 06/20/2015, 8:59 PM

## 2015-06-20 NOTE — Progress Notes (Signed)
1:1 Observation note: Pt observed sitting in the hallway, using the telephone with sitter present. Pt speech is logical and coherent. Pt behavior appropriate at this time. Pt rates depression 8/10. Pt continues to endorse suicidal thoughts this morning. Pt verbally contracts for safety while with sitter. Pt reported that he's not sure what got into him last night for him to make cuts. Pt reported that his mood is inconsistent between crying spells and feeling happy. Pt reported poor sleep last night and reported ongoing nightmares. Pt also verbalized withdrawal symptoms of chills, sweats, nausea and tremors. Scheduled meds and prn meds given as ordered to decrease symptoms. Pt remains on 1:1 observation for safety. Writer will continue to monitor pt.
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
## 2015-06-20 NOTE — Progress Notes (Signed)
Pt did not attend wrap-up group and remained in his room in bed.   Tomi Bamberger, MHT
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
## 2015-06-20 NOTE — Progress Notes (Signed)
1:1 observation note:  Patient has been in day room watching TV.  He continues to complain of nausea and tremors.  He states his suicidal ideation has decreased.  He remains depressed with flat affect.  Patient to remain 1:1 for safety.

## 2015-06-20 NOTE — [facility] Group Notes (Signed)
[facility] LCSW Aftercare Discharge Planning Group Note   06/20/2015 9:57 AM    Participation Quality:  Appropraite  Mood/Affect:  Appropriate  Depression Rating:  8  Anxiety Rating:  10  Thoughts of Suicide:  Yes  Will you contract for safety?   No  Current AVH:  No  Plan for Discharge/Comments:  Patient attended discharge planning group and actively participated in group. Patient continues to endorse HI towards a specific person but did not mention a name.  Outpatient follow up to be determined.  Suicide prevention education reviewed and SPE document provided.   Transportation Means: Patient has transportation.   Supports:  Patient has a support system.   Shaindy Reader, Joesph July

## 2015-06-20 NOTE — Progress Notes (Addendum)
1:1 Nursing note- Patient is currently lying in bed asleep with eyes closed and respirations even and unlabored, no distress noted. Patient remains safe with sitter at bedside. 1:1 continues. Patient came to nursing station shortly after requesting his repeat dose of Trazadone, which he received. He reports that his sitter told him he had been talking in his sleep. Patient is safe and 1:1 continues.

## 2015-06-21 DIAGNOSIS — R45850 Homicidal ideations: Secondary | ICD-10-CM

## 2015-06-21 DIAGNOSIS — F332 Major depressive disorder, recurrent severe without psychotic features: Principal | ICD-10-CM

## 2015-06-21 DIAGNOSIS — F10230 Alcohol dependence with withdrawal, uncomplicated: Secondary | ICD-10-CM

## 2015-06-21 MED ORDER — ATOMOXETINE HCL 25 MG PO CAPS
25.0000 mg | ORAL_CAPSULE | Freq: Every day | ORAL | Status: DC
  Administered 2015-06-22 – 2015-06-24 (×3): 25 mg via ORAL
  Filled 2015-06-21 (×2): qty 1
  Filled 2015-06-21: qty 3
  Filled 2015-06-21 (×3): qty 1

## 2015-06-21 MED ORDER — ONDANSETRON 4 MG PO TBDP
4.0000 mg | ORAL_TABLET | Freq: Four times a day (QID) | ORAL | Status: DC | PRN
Start: 2015-06-21 — End: 2015-06-24
  Administered 2015-06-24: 4 mg via ORAL
  Filled 2015-06-21: qty 1

## 2015-06-21 MED ORDER — CHLORDIAZEPOXIDE HCL 25 MG PO CAPS
25.0000 mg | ORAL_CAPSULE | Freq: Three times a day (TID) | ORAL | Status: DC
  Administered 2015-06-21 – 2015-06-22 (×4): 25 mg via ORAL
  Filled 2015-06-21 (×4): qty 1

## 2015-06-21 MED ORDER — CITALOPRAM HYDROBROMIDE 10 MG PO TABS
10.0000 mg | ORAL_TABLET | Freq: Every day | ORAL | Status: DC
  Administered 2015-06-22 – 2015-06-23 (×2): 10 mg via ORAL
  Filled 2015-06-21 (×5): qty 1

## 2015-06-21 MED ORDER — PRAZOSIN HCL 2 MG PO CAPS
4.0000 mg | ORAL_CAPSULE | Freq: Every day | ORAL | Status: DC
  Administered 2015-06-21 – 2015-06-23 (×3): 4 mg via ORAL
  Filled 2015-06-21 (×2): qty 2
  Filled 2015-06-21: qty 6
  Filled 2015-06-21 (×2): qty 2

## 2015-06-21 NOTE — Progress Notes (Signed)
[facility] Post 1:1 Observation Documentation  For the first (8) hours following discontinuation of 1:1 precautions, a progress note entry by nursing staff should be documented at least every 2 hours, reflecting the patient's behavior, condition, mood, and conversation.  Use the progress notes for additional entries.  Time 1:1 discontinued:  1403   Patient's Behavior:  Calm and cooperative. Observed in hallway engaging with other pts.  Patient's Condition:  Safety maintained.  Patient's Conversation:  Logical/coherent   Teran Knittle L 06/21/2015, 6:44 PM
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
## 2015-06-21 NOTE — Tx Team (Signed)
Interdisciplinary Treatment Plan Update (Adult)  Date:  06/21/2015  Time Reviewed:  8:56 AM   Progress in Treatment: Attending groups: Patient is attending groups. Participating in groups:  Patient engages in discussion Taking medication as prescribed:  Patient is taking medications Tolerating medication:  Patient is tolerating medications Family/Significant othe contact made:   No, will asked for consent to make collateral contact Patient understands diagnosis:Yes, patient understands diagnosis and need for treatment Discussing patient identified problems/goals with staff:  Yes, patient is able to express goals/problems Medical problems stabilized or resolved:  Yes Denies suicidal/homicidal ideation No, patient endorses SI/HI. Issues/concerns per patient self-inventory:   Other:  Discharge Plan or Barriers:  [facility]  Reason for Continuation of Hospitalization: Anxiety Depression Medication stabilization Suicidal ideation Homicidal Ideation  Comments:  Additional comments:  Patient and CSW reviewed Patient Discharge Process Letter/Patient Involvement Form.  Patient verbalized understanding and signed form.  Patient and CSW also reviewed and identified patient's goals and treatment plan.  Patient verbalized understanding and agreed to plan.  Estimated length of stay: 3-5 days  New goal(s):  Review of initial/current patient goals per problem list:  Please see plan of careInterdisciplinary Treatment Plan Update (Adult)  Attendees: Patient 06/21/2015 8:56 AM   Family:   06/21/2015 8:56 AM   Physician:  Nehemiah MassedFernando Cobos, MD 06/21/2015 8:56 AM   Nursing:   Liborio NixonPatrice White, RN 06/21/2015 8:56 AM   Clinical Social Worker:  Juline PatchQuylle Cashtyn Pouliot, LCSW 06/21/2015 8:56 AM   Clinical Social Worker:  Belenda CruiseKristin Drinkard, LCSW-A 06/21/2015 8:56 AM   Case Manager:   06/21/2015 8:56 AM   Other:  Earl ManySara Twyman, Rn 06/21/2015 8:56 AM  Other:   06/21/2015  8:56 AM   Other:   06/21/2015 8:56 AM   Other:  06/21/2015 8:56 AM   Other:  06/21/2015 8:56 AM   Other:  Chad CordialValerie Enoch, [facility] Transition Team Coordinator 06/21/2015 8:56 AM   Other:   06/21/2015 8:56 AM   Other:  06/21/2015 8:56 AM   Other:   06/21/2015 8:56 AM    Scribe for Treatment Team:   Wynn BankerHodnett, Mahayla Haddaway Hairston, 06/21/2015   8:56 AM

## 2015-06-21 NOTE — Progress Notes (Signed)
Recreation Therapy Notes  Animal-Assisted Activity (AAA) Program Checklist/Progress Notes Patient Eligibility Criteria Checklist & Daily Group note for Rec TxIntervention  Date: 06.28.16 Time: 2:45 pm [address]  AAA/T Program Assumption of Risk Form signed by Patient/ or Parent Legal Guardian yes  Patient is free of allergies or sever asthma yes  Patient reports no fear of animals yes  Patient reports no history of cruelty to animalsyes  Patient understands his/her participation is voluntary yes  Patient washes hands before animal contact yes  Patient washes hands after animal contact yes  Behavioral Response: Engaged  Education:Hand Washing, Appropriate Animal Interaction   Education Outcome: Acknowledges understanding/In group clarification offered  Clinical Observations/Feedback: Patient attended group.   Caroll RancherMarjette Johnson [place], LRT/CTRS  Caroll RancherLindsay, Charina Fons A 06/21/2015 4:16 PM

## 2015-06-21 NOTE — Progress Notes (Signed)
[facility] Post 1:1 Observation Documentation  For the first (8) hours following discontinuation of 1:1 precautions, a progress note entry by nursing staff should be documented at least every 2 hours, reflecting the patient's behavior, condition, mood, and conversation.  Use the progress notes for additional entries.  Time 1:1 discontinued:  1403  Patient's Behavior:  Calm and cooperative. Engaging with other pts appropriately in dayroom.  Patient's Condition:  Safety maintained.  Patient's Conversation:  Logical/coherenet   Chase Moss 06/21/2015, 4:03 PM
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
## 2015-06-21 NOTE — Progress Notes (Signed)
[facility] Group Notes:  (Nursing/MHT/Case Management/Adjunct)  Date:  06/21/2015  Time:  8:54 PM  Type of Therapy:  Psychoeducational Skills  Participation Level:  Active  Participation Quality:  Appropriate  Affect:  Appropriate  Cognitive:  Appropriate  Insight:  Appropriate  Engagement in Group:  Engaged  Modes of Intervention:  Discussion    Summary of Progress/Problems: Tonight in wrap up group Tammy SoursGreg said that his day was a 6. His positive thing of the day was he got off his 1:1 and was able to go to dinner and pick out his own foods.   Madaline SavageDiamond N Kamarri Lovvorn 06/21/2015, 8:54 PM

## 2015-06-21 NOTE — [facility] Group Notes (Signed)
[facility] LCSW Group Therapy      Feelings About Diagnosis 1:15 - 2:30 PM         06/21/2015   2:40 PM    Type of Therapy:  Group Therapy  Participation Level:  Active  Participation Quality:  Appropriate  Affect:  Appropriate  Cognitive:  Alert and Appropriate  Insight:  Developing/Improving and Engaged  Engagement in Therapy:  Developing/Improving and Engaged  Modes of Intervention:  Discussion, Education, Exploration, Problem-Solving, Rapport Building, Support  Summary of Progress/Problems:  Patient actively participated in group. Patient discussed past and present diagnosis and the effects it has had on  life.  Patient talked about family and society being judgmental and the stigma associated with having a mental health diagnosis. Patient shared he has come to accept his diagnosis but had a difficult time with being depressed and suicidal as a child. Chase Moss, Chase Moss 06/21/2015  2:40 PM

## 2015-06-21 NOTE — Progress Notes (Signed)
D:Pt laying in bed resting with eyes closed. Respirations even and unlabored. No distress noted. A: Continue 1:1 for safety. R: Pt remains safe.   
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
## 2015-06-21 NOTE — Progress Notes (Signed)
  D: Pt requested trazodone for sleep. Stated he tried sleeping but was having some difficulty.  A: Writer gave pt prn trazodone and informed a second dose was available if needed. 1:1 continued for safety.  R: pt remains safe.

## 2015-06-21 NOTE — Progress Notes (Signed)
Adult Psychoeducational Group Note  Date:  06/21/2015 Time:  0900  Group Topic/Focus:  Orientation:   The focus of this group is to educate the patient on the purpose and policies of crisis stabilization and provide a format to answer questions about their admission.  The group details unit policies and expectations of patients while admitted.  Participation Level:  Active  Participation Quality:  Appropriate  Affect:  Appropriate  Cognitive:  Appropriate  Insight: Appropriate  Engagement in Group:  Engaged  Modes of Intervention:  Education  Additional Comments:    Chidinma Clites L 06/21/2015, 2:09 PM

## 2015-06-21 NOTE — Progress Notes (Signed)
[facility] Post 1:1 Observation Documentation  For the first (8) hours following discontinuation of 1:1 precautions, a progress note entry by nursing staff should be documented at least every 2 hours, reflecting the patient's behavior, condition, mood, and conversation.  Use the progress notes for additional entries.  Time 1:1 discontinued:  1403  Patient's Behavior:  Calm and cooperative. Actively attending group.  Patient's Condition:  Safety maintained.  Patient's Conversation:  Logical/coherent   Annemarie Sebree L 06/21/2015, 2:04 PM

## 2015-06-22 MED ORDER — CARBAMAZEPINE 200 MG PO TABS
200.0000 mg | ORAL_TABLET | Freq: Two times a day (BID) | ORAL | Status: DC
  Administered 2015-06-22 – 2015-06-24 (×5): 200 mg via ORAL
  Filled 2015-06-22: qty 6
  Filled 2015-06-22 (×8): qty 1
  Filled 2015-06-22: qty 6

## 2015-06-22 MED ORDER — CHLORDIAZEPOXIDE HCL 25 MG PO CAPS
25.0000 mg | ORAL_CAPSULE | Freq: Two times a day (BID) | ORAL | Status: DC
  Administered 2015-06-22 – 2015-06-23 (×2): 25 mg via ORAL
  Filled 2015-06-22 (×2): qty 1
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
## 2015-06-22 NOTE — Progress Notes (Signed)
[facility] Post 1:1 Observation Documentation  For the first (8) hours following discontinuation of 1:1 precautions, a progress note entry by nursing staff should be documented at least every 2 hours, reflecting the patient's behavior, condition, mood, and conversation.  Use the progress notes for additional entries.  Time 1:1 discontinued:  1403  Patient's Behavior:  Depressed  Patient's Condition: appropriate   Patient's Conversation:  Discussed medications and pt requested a list of his medications. Stated his sister is an Charity fundraiserN and wants to know what he is taking.  Fransico MichaelBrooks, Nicholle Falzon Laverne 06/22/2015, 6:05 AM

## 2015-06-22 NOTE — [facility] Group Notes (Signed)
[facility]    06/22/2015 3:33 PM  Type of Therapy:  Group Therapy  Participation Level:  Did Not Attend Wynn BankerHodnett, Chase Moss 06/22/2015, 3:33 PM

## 2015-06-22 NOTE — Progress Notes (Signed)
Recreation Therapy Notes  Date: 06.29.16 Time: 9:30 am Location: [address] Dayroom  Group Topic: Stress Management  Goal Area(s) Addresses:  Patient will verbalize importance of using healthy stress management.  Patient will identify positive emotions associated with healthy stress management.   Intervention: Stress Management  Activity : Progressive Muscle Relaxation. LRT introduced and educated patients on stress management technique of progressive muscle relaxation. Moss script was used to to deliver the technique to patients. Patients were asked to follow script read aloud by LRT to engage in the stress management technique.  Education: Stress Management, Discharge Planning.   Education Outcome: Acknowledges edcuation/In group clarification offered/Needs additional education  Clinical Observations/Feedback: Patient did not attend group.  Chase Moss , LRT/CTRS         Chase Moss 06/22/2015 3:15 PM 

## 2015-06-22 NOTE — Progress Notes (Signed)
Adult Psychoeducational Group Note  Date:  06/22/2015 Time:  9:16 PM  Group Topic/Focus:  Wrap-Up Group:   The focus of this group is to help patients review their daily goal of treatment and discuss progress on daily workbooks.  Participation Level:  Active  Participation Quality:  Appropriate  Affect:  Appropriate  Cognitive:  Appropriate  Insight: Appropriate  Engagement in Group:  Engaged  Modes of Intervention:  Discussion  Additional Comments: The patient expressed that he attended group.The patient also said that medication made him rate his day a 5.  Octavio Mannshigpen, [place] Winegarden Lee 06/22/2015, 9:16 PM
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
## 2015-06-22 NOTE — [facility] Group Notes (Signed)
[facility]   06/22/2015 9:35 AM    Participation Quality:  Appropraite  Mood/Affect:  Appropriate  Depression Rating:  8  Anxiety Rating:  7  Thoughts of Suicide: Yes  Will you contract for safety?   Yes  Current AVH:  No  Plan for Discharge/Comments:  Patient attended discharge planning group and actively participated in group.  He reports being better but continues to endorse SI.  [facility]  Suicide prevention education reviewed and SPE document provided.   Transportation Means: Patient has transportation.   Supports:  Patient has a support system.   Jann Ra, Joesph JulyQuylle Hairston
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
## 2015-06-23 MED ORDER — CITALOPRAM HYDROBROMIDE 20 MG PO TABS
20.0000 mg | ORAL_TABLET | Freq: Every day | ORAL | Status: DC
  Administered 2015-06-24: 20 mg via ORAL
  Filled 2015-06-23: qty 1
  Filled 2015-06-23: qty 3
  Filled 2015-06-23: qty 1

## 2015-06-23 NOTE — [facility] Group Notes (Signed)
[facility] LCSW Group Therapy  [facility] 1:15 - 2:30 PM  06/23/2015 3:12 PM   Type of Therapy:  Group Therapy  Participation Level: Active  Participation Quality:  Attentive  Affect:  Appropriate  Cognitive:  Appropriate  Insight:  Developing/Improving   Engagement in Therapy:  Developing/Improving   Modes of Intervention:  Discussion, Education, Exploration, Problem-Solving, Rapport Building, Support   Summary of Progress/Problems:   Patient was attentive to speaker from the [employer] [facility] [employer] as he shared his story of dealing with mental health/substance abuse issues and overcoming it by working a recovery program.  Patient stated he enjoyed the presentation.  He received information on their agency.    Wynn BankerHodnett, [place] Yearby Hairston 06/23/2015 3:12 PM

## 2015-06-23 NOTE — [facility] Suicide Risk Assessment (Deleted)
[facility] INPATIENT:  Family/Significant Other Suicide Prevention Education  Suicide Prevention Education: Late Entry for 06/22/15 Family/Significant Other Refusal to Support Patient after Discharge:  Suicide Prevention Education Not Provided:  Patient has identified home of family/significant other as the place the patient will be residing after discharge.  With written consent of the patient, two attempts were made to provide Suicide Prevention Education to Capital OneMitch Moss, Father, [phone]. Father became angry and hung up because patient would probably be discharged before Monday, June 27, 2015.  [name] 06/23/2015,9:00 AM

## 2015-06-23 NOTE — [facility] Group Notes (Signed)
Adult Psychoeducational Group Note  Date:  06/23/2015 Time:  0900am  Group Topic/Focus:  Goals Group:   The focus of this group is to help patients establish daily goals to achieve during treatment and discuss how the patient can incorporate goal setting into their daily lives to aide in recovery. Orientation:   The focus of this group is to educate the patient on the purpose and policies of crisis stabilization and provide a format to answer questions about their admission.  The group details unit policies and expectations of patients while admitted.  Participation Level:  Active  Participation Quality:  Appropriate and Attentive  Affect:  Appropriate  Cognitive:  Alert and Appropriate  Insight: Appropriate and Good  Engagement in Group:  Engaged  Modes of Intervention:  Discussion, Education, Orientation and Support  Alfonse Sprucehorne, Ahmeer Tuman Brooke 06/23/2015, 9:59 AM

## 2015-06-23 NOTE — Progress Notes (Signed)
D:  Per pt self inventory pt reports sleeping fair, appetite fair, energy level low, ability to pay attention poor, rates depression at an 8 out of 10, hopelessness at an 8 out of 10, anxiety at a 10 out of 10, denies SI/HI/AVH, depressed/anxious during interaction, goal today:  "to feel better about life and waking up everyday"      A:  Emotional support provided, Encouraged pt to continue with treatment plan and attend all group activities, q15 min checks maintained for safety.  R:  Pt is receptive, going to groups, pleasant and cooperative with staff and other patients on the unit.

## 2015-06-23 NOTE — Progress Notes (Signed)
D: Pt at this time is alert and oreientedx3. Pt endorses severe anxiety and depression; he states, "I have been depressed all day", "I feel my body is going to explode from inside out" Pt however denies SI/HI, was also calm and cooperative. A: Medications administered as prescribed. Support, encouragement and safe environment offered. 15 minute safety checks continue. R: Pt was med compliant. Pt attended group. Safety checks continue.

## 2015-06-23 NOTE — Clinical Social Work Note (Signed)
[facility].  Samuella BruinKristin Rena Hunke, MSW, [employer] IncLCSWA Clinical Social Worker [facility] [phone]

## 2015-06-23 NOTE — Progress Notes (Signed)
Patient ID: Chase Moss, male   DOB: 11-Jul-1966, 49 y.o.   MRN: [id number] [facility] MD Progress Note  06/23/2015 4:56 PM Alexi Geibel  MRN:  [id number] Subjective: Patient states he is feeling better than yesterday, although reports increased anxiety after police officer provided him with court paperwork related to involuntary commitment status/ right to a hearing. Patient misconstrued this as that he was in trouble and might be arrested or that he was being committed to a long term mental hospital, causing significant initial anxiety, which subsided , resolved when he understood process better and was reassured . He stated that for a brief period of time today  had felt more depressed due to this and to a group therapy session where " something that someone said made me remember stuff from prison".  He states medications are working for him and denies side effects.  Objective : I have discussed case with treatment team, and have met with patient. Patient states he is feeling better, and is presenting with improved mood, affect. As noted in prior notes, no ongoing homicidal ideations.  No ongoing alcohol WDL symptoms- no tremors, no diaphoresis, no facial flushing and he is not restless. Vitals stable. He has been going to some groups- no disruptive behaviors on unit .  Denies medication side effects at this time. I have also seen patient along with CSW- patient has expressed interest in going to a 28 day Rehab after discharge to continue working on early recovery, relapse prevention, but he states his sister has told him he can stay with her for a period of time as well. Denies alcohol cravings- not interested in Campral trial.  Responsive to support, encouragement.   Principal Problem: Major depressive disorder Diagnosis:   Patient Active Problem List   Diagnosis Date Noted  . Alcohol dependence with uncomplicated withdrawal [W62.035]   . Major depressive disorder [F32.2] 06/18/2015  . Bipolar 1  disorder, mixed, moderate [F31.62]   . Moderate cannabis use disorder [F12.90]   . PTSD (post-traumatic stress disorder) [F43.10]   . Polysubstance abuse [F19.10] 07/07/2014  . Substance induced mood disorder [F19.94] 07/07/2014  . Bipolar I disorder, most recent episode mixed [F31.60] 03/15/2014  . MDD (major depressive disorder), recurrent episode, severe [F33.2] 03/12/2014  . Alcohol dependence [F10.20] 04/10/2012  . Suicidal thoughts [R45.851] 04/04/2012  . Post traumatic stress disorder (PTSD) [F43.10] 04/04/2012  . Depression [F32.9] 04/04/2012   Total Time spent with patient: 25 minutes    Past Medical History:  Past Medical History  Diagnosis Date  . Hypertension   . Depression   . PTSD (post-traumatic stress disorder)   . Bipolar 1 disorder    History reviewed. No pertinent past surgical history. Family History:  Family History  Problem Relation Age of Onset  . Heart attack Other    Social History:  History  Alcohol Use  . 1.2 oz/week  . 2 Cans of beer per week    Comment: daily alcohol     History  Drug Use  . Yes  . Special: Marijuana    Comment: several times a day    History   Social History  . Marital Status: Divorced    Spouse Name: N/A  . Number of Children: N/A  . Years of Education: N/A   Social History Main Topics  . Smoking status: Current Every Day Smoker -- 1.00 packs/day for 30 years    Types: Cigarettes  . Smokeless tobacco: Never Used  . Alcohol Use: 1.2 oz/week  2 Cans of beer per week     Comment: daily alcohol  . Drug Use: Yes    Special: Marijuana     Comment: several times a day  . Sexual Activity: Not on file   Other Topics Concern  . None   Social History Narrative   Additional History:    Sleep: fair   Appetite:  Fair   Assessment:   Musculoskeletal: Strength & Muscle Tone: within normal limits Gait & Station: normal Patient leans: normally   Psychiatric Specialty Exam: Physical Exam  Review of  Systems  Constitutional: Negative.   HENT: Negative.   Eyes: Negative.   Respiratory: Negative.   Cardiovascular: Negative.   Gastrointestinal: Negative.   Genitourinary: Negative.   Musculoskeletal: Negative.   Skin: Negative.   Neurological: Negative.   Endo/Heme/Allergies: Negative.   Psychiatric/Behavioral: Positive for depression and substance abuse. The patient is nervous/anxious and has insomnia.     Blood pressure 128/92, pulse 79, temperature 97.6 F (36.4 C), temperature source Oral, resp. rate 18, height _0  (1.676 m), weight 170 lb (77.111 kg), SpO2 97 %.Body mass index is 27.45 kg/(m^2).  General Appearance: improved grooming   Eye Contact::  Good  Speech:  Normal Rate  Volume:  Normal  Mood:   Less depressed    Affect:   More reactive   Thought Process:  Coherent and Goal Directed  Orientation:  Full (Time, Place, and Person)  Thought Content:  denies hallucinations, no delusions, not internally preoccupied   Suicidal Thoughts:  No as noted, at this time denies thoughts of SI  Homicidal Thoughts:  No- as above , denies any HI towards man who he reports ran into his mother   Memory:  Immediate;   Fair Recent;   Fair Remote;   Fair  Judgement:  Other:  improving  Insight:  improving   Psychomotor Activity:  Normal, no tremors or psychomotor agitation noted today   Concentration:  Fair  Recall:  [employer] of Knowledge:Fair  Language: Fair  Akathisia:  No  Handed:  Right  AIMS (if indicated):     Assets:  Desire for Improvement  ADL's:  Intact  Cognition: WNL  Sleep:  Number of Hours: 3.25     Current Medications: Current Facility-Administered Medications  Medication Dose Route Frequency Provider Last Rate Last Dose  . acetaminophen (TYLENOL) tablet 650 mg  650 mg Oral Q6H PRN Harriet Butte, NP      . alum & mag hydroxide-simeth (MAALOX/MYLANTA) 200-200-20 MG/5ML suspension 30 mL  30 mL Oral Q4H PRN Harriet Butte, NP      . atenolol (TENORMIN)  tablet 50 mg  50 mg Oral Daily Harriet Butte, NP   50 mg at 06/23/15 8101  . atomoxetine (STRATTERA) capsule 25 mg  25 mg Oral Daily Jenne Campus, MD   25 mg at 06/23/15 7510  . carbamazepine (TEGRETOL) tablet 200 mg  200 mg Oral BID Jenne Campus, MD   200 mg at 06/23/15 2585  . chlordiazePOXIDE (LIBRIUM) capsule 25 mg  25 mg Oral BID Jenne Campus, MD   25 mg at 06/23/15 2778  . [START ON 06/24/2015] citalopram (CELEXA) tablet 20 mg  20 mg Oral Daily Myer Peer Cobos, MD      . enalapril (VASOTEC) tablet 2.5 mg  2.5 mg Oral Daily Harriet Butte, NP   2.5 mg at 06/23/15 2423  . magnesium hydroxide (MILK OF MAGNESIA) suspension 30 mL  30 mL  Oral Daily PRN Harriet Butte, NP      . multivitamin with minerals tablet 1 tablet  1 tablet Oral Daily Shuvon B Rankin, NP   1 tablet at 06/23/15 [phone]  . ondansetron (ZOFRAN-ODT) disintegrating tablet 4 mg  4 mg Oral Q6H PRN Laverle Hobby, PA-C      . pneumococcal 23 valent vaccine (PNU-IMMUNE) injection 0.5 mL  0.5 mL Intramuscular Tomorrow-1000 Shuvon B Rankin, NP   0.5 mL at 06/19/15 0959  . pravastatin (PRAVACHOL) tablet 40 mg  40 mg Oral Daily Harriet Butte, NP   40 mg at 06/23/15 6269  . prazosin (MINIPRESS) capsule 4 mg  4 mg Oral QHS Jenne Campus, MD   4 mg at 06/22/15 2139  . thiamine (VITAMIN B-1) tablet 100 mg  100 mg Oral Daily Shuvon B Rankin, NP   100 mg at 06/23/15 0833  . traZODone (DESYREL) tablet 50 mg  50 mg Oral QHS PRN Harriet Butte, NP   50 mg at 06/22/15 2139    Lab Results: No results found for this or any previous visit (from the past 48 hour(s)).  Physical Findings: AIMS: Facial and Oral Movements Muscles of Facial Expression: None, normal Lips and Perioral Area: None, normal Jaw: None, normal Tongue: None, normal,Extremity Movements Upper (arms, wrists, hands, fingers): None, normal Lower (legs, knees, ankles, toes): None, normal, Trunk Movements Neck, shoulders, hips: None, normal, Overall  Severity Severity of abnormal movements (highest score from questions above): None, normal Incapacitation due to abnormal movements: None, normal Patient's awareness of abnormal movements (rate only patient's report): No Awareness,    CIWA:  CIWA-Ar Total: 3 COWS:      Assessment- patient  Is improving and states he feels less depressed- affect more reactive. Did have a brief period of time today where he felt more depressed, anxious, due to being given court papers pertaining to his involuntary status /hearing if needed , and he misconstrued this for legal problem or commitment to a state hospital. Patient has past criminal history/incarcerations, and states that he felt scared about police officer serving him papers. He rapidly improved with reassurance and explanation of situation. At this time mood improved, no ongoing symptoms of WDL. No ongoing HI, denies any thoughts of violence.  He is tolerating medications well.   Treatment Plan Summary: Daily contact with patient to assess and evaluate symptoms and progress in treatment and Medication management  Continue  Strattera  25 mgrs QDAY - to address ADHD Continue Minipress to 4 mgrs QHS to address PTSD related nightmares Increase Celexa to  20 mgrs QAM for depression and PTSD symptoms  Continue Trazodone 50 mgrs QHS PRN for insomnia  Continue  Tegretol 200 mgrs BID for mood disorder  Discontinue  Librium  As no symptoms of WDL Medical Decision Making:  Review of Psycho-Social Stressors (1), Review or order clinical lab tests (1), Review of Medication Regimen & Side Effects (2) and Review of New Medication or Change in Dosage (2)     COBOS, FERNANDO 06/23/2015, 4:56 PM

## 2015-06-23 NOTE — Progress Notes (Signed)
Pt attended karaoke group tonight. 

## 2015-06-24 MED ORDER — ATENOLOL 50 MG PO TABS: ORAL_TABLET | ORAL | Status: DC

## 2015-06-24 MED ORDER — CARBAMAZEPINE 200 MG PO TABS: 200.0000 mg | ORAL_TABLET | Freq: Two times a day (BID) | ORAL | Status: DC

## 2015-06-24 MED ORDER — PRAVASTATIN SODIUM 40 MG PO TABS: 40.0000 mg | ORAL_TABLET | Freq: Every day | ORAL | Status: DC

## 2015-06-24 MED ORDER — ADULT MULTIVITAMIN W/MINERALS CH: 1.0000 | ORAL_TABLET | Freq: Every day | ORAL | Status: DC

## 2015-06-24 MED ORDER — CITALOPRAM HYDROBROMIDE 20 MG PO TABS: 20.0000 mg | ORAL_TABLET | Freq: Every day | ORAL | Status: DC

## 2015-06-24 MED ORDER — THIAMINE HCL 100 MG PO TABS: 100.0000 mg | ORAL_TABLET | Freq: Every day | ORAL | Status: DC

## 2015-06-24 MED ORDER — TRAZODONE HCL 50 MG PO TABS: 50.0000 mg | ORAL_TABLET | Freq: Every evening | ORAL | Status: DC | PRN

## 2015-06-24 MED ORDER — PRAZOSIN HCL 2 MG PO CAPS: 4.0000 mg | ORAL_CAPSULE | Freq: Every day | ORAL | Status: DC

## 2015-06-24 MED ORDER — ENALAPRIL MALEATE 2.5 MG PO TABS: 2.5000 mg | ORAL_TABLET | Freq: Every day | ORAL | Status: DC

## 2015-06-24 MED ORDER — ATOMOXETINE HCL 25 MG PO CAPS: 25.0000 mg | ORAL_CAPSULE | Freq: Every day | ORAL | Status: DC

## 2015-06-24 NOTE — [facility] Group Notes (Signed)
[facility] Note   06/24/2015 10:21 AM    Participation Quality:  Appropraite  Mood/Affect:  Appropriate  Depression Rating:  3  Anxiety Rating:  5  Thoughts of Suicide:  No  Will you contract for safety?   NA  Current AVH:  No  Plan for Discharge/Comments:  Patient attended discharge planning group and actively participated in group. Patient reports being much and [facility]  He will also follow up with [facility]. Suicide prevention education reviewed and SPE document provided.   Transportation Means: Patient has transportation.   Supports:  Patient has a support system.   Jaquaya Coyle, Joesph JulyQuylle Hairston

## 2015-06-24 NOTE — Progress Notes (Signed)
Discharge Note:  Patient discharged home.  Patient denied SI and HI.  Denied A/V hallucinations.  Denied pain.  Patient stated he received all his belongings, phone, headaches, belt, lighter, keys, shades, wallet, IDs, prescriptions, medications, clothing, misc items, toiletries, etc.  Suicide prevention information given and discussed with patient who stated he understood and had no questions.  Patient stated he [facility].

## 2015-06-24 NOTE — Progress Notes (Signed)
Recreation Therapy Notes  Date: 07.01.16 Time: 9:30 am Location: [address] Group Room  Group Topic: Stress Management  Goal Area(s) Addresses:  Patient will verbalize importance of using healthy stress management.  Patient will identify positive emotions associated with healthy stress management.   Intervention: Stress Management  Activity :  Guided Imagery Script.  LRT introduced and educated patients on stress management technique of guided imagery.  A script was used to deliver the technique to patients.  Patients were asked to follow script read aloud by LRT to engage in practicing the stress management technique.  Education:  Stress Management, Discharge Planning.   Education Outcome: Acknowledges edcuation/In group clarification offered/Needs additional education  Clinical Observations/Feedback: Patient did not attend group.   Jazmine Longshore, LRT/CTRS         Chase Moss A 06/24/2015 1:31 PM 

## 2015-06-24 NOTE — Progress Notes (Signed)
D:  Patient's self inventory sheet, patient has fair sleep, sleep medication is helpful.  Good appetite, normal energy level, good concentration.  Rated depression 3, hopeless 2, anxiety 5.  Denied withdrawals.  Denied SI.  Denied physical problems.  Physical pain, worst pain #5, legs, arms, chest, been working out, normal pain.  No pain medication.  Goal is to get referrals for primary physician and physical that are accepting new patients with medicaid and get out.  Plans to smile a lot and ask for referral.  "Thanks for caring and helping me through this difficult time of my life."  Does have some discharge plans. A:  Medications administered per MD orders.  Emotional support and encouragement given patient. R:  Denied SI and HI, contracts for safety.  Denied A/V hallucinations.  Safety maintained with 15 minute checks.

## 2015-06-24 NOTE — Tx Team (Signed)
Interdisciplinary Treatment Plan Update (Adult)  Date:  06/24/2015  Time Reviewed:  9:38 AM   Progress in Treatment: Attending groups: Patient is attending groups. Participating in groups:  Patient engages in discussion Taking medication as prescribed:  Patient is taking medications Tolerating medication:  Patient is tolerating medications Family/Significant othe contact made:   No but attempts have been made to contact sister, Otilio SaberCarla Vanlue. Patient understands diagnosis:Yes, patient understands diagnosis and need for treatment Discussing patient identified problems/goals with staff:  Yes, patient is able to express goals/problems Medical problems stabilized or resolved:  Yes Denies suicidal/homicidal ideation No, patient endorses SI/HI. Issues/concerns per patient self-inventory:   Other:  Discharge Plan or Barriers:  [facility]  Reason for Continuation of Hospitalization:  Comments:  Additional comments:  Patient and CSW reviewed Patient Discharge Process Letter/Patient Involvement Form.  Patient verbalized understanding and signed form.  Patient and CSW also reviewed and identified patient's goals and treatment plan.  Patient verbalized understanding and agreed to plan.  Estimated length of stay: Discharge Today  New goal(s):  Review of initial/current patient goals per problem list:  Please see plan of careInterdisciplinary Treatment Plan Update (Adult)  Attendees: Patient 06/24/2015 9:38 AM   Family:   06/24/2015 9:38 AM   Physician:  Nehemiah MassedFernando Cobos, MD 06/24/2015 9:38 AM   Nursing:   Quintella ReichertBeverly Knight, RN 06/24/2015 9:38 AM   Clinical Social Worker:  Juline PatchQuylle Lexus Shampine, LCSW 06/24/2015 9:38 AM   Clinical Social Worker:  Samuella BruinKristin Drinkard, LCSW-A 06/24/2015 9:38 AM   Case Manager:  Onnie BoerJennifer Clark RN, UR 06/24/2015 9:38 AM   Other:  Genene ChurnValeirie Enoch, [facility] Transition Team 06/24/2015 9:38 AM  Other:   06/24/2015  9:38 AM   Other:  06/24/2015 9:38 AM   Other:   06/24/2015 9:38 AM   Other:  06/24/2015 9:38 AM   Other:  Chad CordialValerie Enoch, [facility] Transition Team Coordinator 06/24/2015 9:38 AM   Other:   06/24/2015 9:38 AM   Other:  06/24/2015 9:38 AM   Other:   06/24/2015 9:38 AM    Scribe for Treatment Team:   Wynn BankerHodnett, Solenne Manwarren Hairston, 06/24/2015   9:38 AM
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
## 2015-06-24 NOTE — Progress Notes (Signed)
Patient ID: Chase Moss, male   DOB: 02/21/1966, 49 y.o.   MRN: [id number]  D: Patient pleasant on approach tonight. Bright affect when speaking with him. Went to [employer] group tonight. Supportive and helpful with other patients on unit. Mood stable at present. No SI/HI at present.  A: Staff will monitor on q 15 minute checks, follow treatment plan, and give meds as ordered. R: Took meds without issue. Cooperative on the unit.

## 2015-06-24 NOTE — [facility] Group Notes (Signed)
[facility] LCSW Group Therapy 06/24/2015 1:15 PM Type of Therapy: Group Therapy Participation Level: Active  Participation Quality: Attentive, Sharing and Supportive  Affect: Appropriate  Cognitive: Alert and Oriented  Insight: Developing/Improving and Engaged  Engagement in Therapy: Developing/Improving and Engaged  Modes of Intervention: Clarification, Confrontation, Discussion, Education, Exploration, Limit-setting, Orientation, Problem-solving, Rapport Building, Dance movement psychotherapisteality Testing, Socialization and Support  Summary of Progress/Problems: The topic for today was feelings about relapse. Pt discussed what relapse prevention is to them and identified triggers that they are on the path to relapse. Pt processed their feeling towards relapse and was able to relate to peers. Pt discussed coping skills that can be used for relapse prevention. Patient shared that he needs to change his environment to promote his recovery. He specifically mentioned that he needs to distance himself from his roommate who is involved with drugs and get back into a positive support network such as AA/NA. He provided another group member with emotional support.    Samuella BruinKristin Johnnisha Forton, MSW, [employer]CSWA Clinical Social Worker [facility] [phone]

## 2015-06-24 NOTE — Progress Notes (Signed)
  [facility] :  Will you be returning to the same living situation after discharge:  No.  Patient is discharging home with sister. At discharge, do you have transportation home?: Yes,  Patient is able to arrange transportation Do you have the ability to pay for your medications: Yes,  Patient has [employer].  Release of information consent forms completed and in the chart;  Patient's signature needed at discharge.  Patient to Follow up at: Follow-up Information    Follow up with [facility]  On 06/29/2015.   Why:  Please go to [facility]'s walk in clinic on Wednesday, June 29, 2015 or any weekday between 8AM - 3PM for medication management if not accepted to [facility].   Contact information:   [address] [place], [place]   [address]  [phone]      [facility] On 06/[facility]/2015.   Why:  Please call [facility] on Tuesday, June 28, 2015 to see if they are able to accept you for residential treatment.    Contact information:   [address] [place], [place]   [address]  [phone]      Patient denies SI/HI:     Safety Planning and Suicide Prevention discussed: .Reviewed with all patients during discharge planning group   Have you used any form of tobacco in the last 30 days? (Cigarettes, Smokeless Tobacco, Cigars, and/or Pipes): Yes  Has patient been referred to the Quitline?: Patient declined referral to Quitline.   Wynn BankerHodnett, Chase Schamp Hairston 06/24/2015, 11:04 AM
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
## 2015-06-24 NOTE — [facility] Suicide Risk Assessment (Signed)
[facility] INPATIENT:  Family/Significant Other Suicide Prevention Education  Suicide Prevention Education:  Contact Attempts; Otilio SaberCarla Cuen, Sister, [phone]; has been identified by the patient as the family member/significant other with whom the patient will be residing, and identified as the person(s) who will aid the patient in the event of a mental health crisis.  With written consent from the patient, two attempts were made to provide suicide prevention education, prior to and/or following the patient's discharge.  We were unsuccessful in providing suicide prevention education.  A suicide education pamphlet was given to the patient to share with family/significant other.  Date and time of first attempt: 06/22/15 at 11:30 AM Date and time of second attempt: 06/24/15 returned call from [name] but no answer.  Message left on voice mail.  Second call on 06/24/15 at 1:55PM.  Chase Moss, Chase Moss 06/24/2015, 1:53 PM

## 2015-07-27 ENCOUNTER — Encounter (HOSPITAL_COMMUNITY): Payer: Self-pay | Admitting: Emergency Medicine

## 2015-07-27 ENCOUNTER — Emergency Department (HOSPITAL_COMMUNITY)
Admission: EM | Admit: 2015-07-27 | Discharge: 2015-07-27 | Disposition: A | Discharge status: Home / Self Care | Payer: Medicaid Other | Attending: Emergency Medicine | Admitting: Emergency Medicine

## 2015-07-27 ENCOUNTER — Emergency Department (HOSPITAL_COMMUNITY): Payer: Medicaid Other

## 2015-07-27 DIAGNOSIS — S8991XA Unspecified injury of right lower leg, initial encounter: Secondary | ICD-10-CM | POA: Diagnosis not present

## 2015-07-27 DIAGNOSIS — Z720 Tobacco use: Secondary | ICD-10-CM | POA: Insufficient documentation

## 2015-07-27 DIAGNOSIS — T1490XA Injury, unspecified, initial encounter: Secondary | ICD-10-CM

## 2015-07-27 DIAGNOSIS — I10 Essential (primary) hypertension: Secondary | ICD-10-CM | POA: Insufficient documentation

## 2015-07-27 DIAGNOSIS — Y998 Other external cause status: Secondary | ICD-10-CM | POA: Diagnosis not present

## 2015-07-27 DIAGNOSIS — S4992XA Unspecified injury of left shoulder and upper arm, initial encounter: Secondary | ICD-10-CM | POA: Diagnosis present

## 2015-07-27 DIAGNOSIS — S42102A Fracture of unspecified part of scapula, left shoulder, initial encounter for closed fracture: Secondary | ICD-10-CM | POA: Diagnosis not present

## 2015-07-27 DIAGNOSIS — Y9289 Other specified places as the place of occurrence of the external cause: Secondary | ICD-10-CM | POA: Diagnosis not present

## 2015-07-27 DIAGNOSIS — F319 Bipolar disorder, unspecified: Secondary | ICD-10-CM | POA: Insufficient documentation

## 2015-07-27 DIAGNOSIS — Z79899 Other long term (current) drug therapy: Secondary | ICD-10-CM | POA: Insufficient documentation

## 2015-07-27 DIAGNOSIS — Y9389 Activity, other specified: Secondary | ICD-10-CM | POA: Insufficient documentation

## 2015-07-27 DIAGNOSIS — S42109A Fracture of unspecified part of scapula, unspecified shoulder, initial encounter for closed fracture: Secondary | ICD-10-CM | POA: Diagnosis present

## 2015-07-27 MED ORDER — NAPROXEN 500 MG PO TABS: 500.0000 mg | ORAL_TABLET | Freq: Two times a day (BID) | ORAL | Status: DC

## 2015-07-27 MED ORDER — HYDROMORPHONE HCL 1 MG/ML IJ SOLN
1.0000 mg | Freq: Once | INTRAMUSCULAR | Status: AC
  Administered 2015-07-27: 1 mg via INTRAVENOUS
  Filled 2015-07-27: qty 1

## 2015-07-27 MED ORDER — HYDROCODONE-ACETAMINOPHEN 5-325 MG PO TABS: 2.0000 | ORAL_TABLET | ORAL | Status: DC | PRN

## 2015-07-27 MED ORDER — IOHEXOL 300 MG/ML  SOLN
100.0000 mL | Freq: Once | INTRAMUSCULAR | Status: AC | PRN
  Administered 2015-07-27: 80 mL via INTRAVENOUS

## 2015-07-27 NOTE — ED Notes (Signed)
Given pain meds will wait to be d/c until observed.

## 2015-07-27 NOTE — Progress Notes (Signed)
Orthopedic Tech Progress Note Patient Details:  Chase Moss Oct 04, 1966 [id number]  Ortho Devices Type of Ortho Device: Arm sling Ortho Device/Splint Interventions: Application   Cammer, Mickie Bail 07/27/2015, 5:39 PM
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
## 2015-07-27 NOTE — ED Notes (Signed)
Patient transported to X-ray 
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
## 2015-07-27 NOTE — Discharge Instructions (Signed)
Scapular Fracture °You have a fracture (break in bone) of your scapula. This is your shoulder blade. It is the large flat bone behind your shoulder. This is also the bone that makes up the ball and socket joint of your shoulder. Most of the time surgery is not required for injuries to this bone unless the socket of the shoulder joint is involved. °DIAGNOSIS  °Because of the severity of force usually required to break this bone, x-rays are often taken of other bones likely to be injured at the same time. X-rays of the hip, knee, and pelvis may be taken. Specialized x-rays (arteriograms) may be needed if there are injuries to large blood vessels associated with this injury. °HOME CARE INSTRUCTIONS  °· Simple fractures of the scapula can be treated with a sling and swathe type of immobilization. This means the involved area is held in place by putting the arm in a sling. A wrap is made around the upper arm with the sling holding the arm next to the chest. This may be removed for bathing as instructed by your caregiver. °· Apply ice to the injury for 15-20 minutes 03-04 times per day. Put the ice in a plastic bag. Place a towel between the bag of ice and your skin, splint, or immobilization device. °· Do not resume use until instructed by your caregiver. Usually full rehabilitation (exercises to improve the injury site) will begin sometime after the sling and swathe are removed. Then begin use gradually as directed. Do not increase use to the point of pain. If pain develops, decrease use and continue the above measures. Slowly increase activities that do not cause discomfort until you gradually achieve normal use without pain. °· Only take over-the-counter or prescription medicines for pain, discomfort, or fever as directed by your caregiver. °SEEK IMMEDIATE MEDICAL CARE IF:  °· Your pain and swelling increase and is uncontrolled with medications. °· You develop new, unexplained symptoms or an increase of the symptoms  which brought you to your caregiver. °· You develop shortness or breath or cough up blood. °· You are unable to move your arm or fingers. You develop warmth and swelling in your affected arm. °· You develop an unexplained temperature. °Document Released: 12/10/2005 Document Revised: 03/03/2012 Document Reviewed: 11/01/2006 °[employer]® Patient Information ©2015 [employer]. This information is not intended to replace advice given to you by your health care provider. Make sure you discuss any questions you have with your health care provider. ° °

## 2016-02-11 ENCOUNTER — Emergency Department (HOSPITAL_COMMUNITY)
Admission: EM | Admit: 2016-02-11 | Discharge: 2016-02-11 | Disposition: A | Discharge status: Home / Self Care | Payer: Medicaid Other | Attending: Emergency Medicine | Admitting: Emergency Medicine

## 2016-02-11 ENCOUNTER — Emergency Department (HOSPITAL_COMMUNITY): Payer: Medicaid Other

## 2016-02-11 ENCOUNTER — Encounter (HOSPITAL_COMMUNITY): Payer: Self-pay | Admitting: *Deleted

## 2016-02-11 DIAGNOSIS — F319 Bipolar disorder, unspecified: Secondary | ICD-10-CM | POA: Insufficient documentation

## 2016-02-11 DIAGNOSIS — R197 Diarrhea, unspecified: Secondary | ICD-10-CM | POA: Insufficient documentation

## 2016-02-11 DIAGNOSIS — R51 Headache: Secondary | ICD-10-CM | POA: Diagnosis present

## 2016-02-11 DIAGNOSIS — Z79899 Other long term (current) drug therapy: Secondary | ICD-10-CM | POA: Insufficient documentation

## 2016-02-11 DIAGNOSIS — R112 Nausea with vomiting, unspecified: Secondary | ICD-10-CM | POA: Diagnosis not present

## 2016-02-11 DIAGNOSIS — Z791 Long term (current) use of non-steroidal anti-inflammatories (NSAID): Secondary | ICD-10-CM | POA: Insufficient documentation

## 2016-02-11 DIAGNOSIS — F17210 Nicotine dependence, cigarettes, uncomplicated: Secondary | ICD-10-CM | POA: Diagnosis not present

## 2016-02-11 DIAGNOSIS — I10 Essential (primary) hypertension: Secondary | ICD-10-CM | POA: Diagnosis not present

## 2016-02-11 DIAGNOSIS — R519 Headache, unspecified: Secondary | ICD-10-CM

## 2016-02-11 DIAGNOSIS — J329 Chronic sinusitis, unspecified: Secondary | ICD-10-CM | POA: Insufficient documentation

## 2016-02-11 MED ORDER — KETOROLAC TROMETHAMINE 30 MG/ML IJ SOLN
30.0000 mg | Freq: Once | INTRAMUSCULAR | Status: AC
  Administered 2016-02-11: 30 mg via INTRAVENOUS
  Filled 2016-02-11: qty 1

## 2016-02-11 MED ORDER — SODIUM CHLORIDE 0.9 % IV BOLUS (SEPSIS)
1000.0000 mL | Freq: Once | INTRAVENOUS | Status: AC
  Administered 2016-02-11: 1000 mL via INTRAVENOUS

## 2016-02-11 MED ORDER — METOCLOPRAMIDE HCL 5 MG/ML IJ SOLN
10.0000 mg | Freq: Once | INTRAMUSCULAR | Status: AC
  Administered 2016-02-11: 10 mg via INTRAVENOUS
  Filled 2016-02-11: qty 2

## 2016-02-11 MED ORDER — IOHEXOL 350 MG/ML SOLN: 100.0000 mL | Freq: Once | INTRAVENOUS | Status: DC | PRN

## 2016-02-11 MED ORDER — AMOXICILLIN-POT CLAVULANATE 875-125 MG PO TABS: 1.0000 | ORAL_TABLET | Freq: Two times a day (BID) | ORAL | Status: DC

## 2016-02-11 MED ORDER — DIPHENHYDRAMINE HCL 50 MG/ML IJ SOLN
25.0000 mg | Freq: Once | INTRAMUSCULAR | Status: AC
  Administered 2016-02-11: 25 mg via INTRAVENOUS
  Filled 2016-02-11: qty 1

## 2016-02-11 NOTE — Discharge Instructions (Signed)

## 2016-02-11 NOTE — ED Notes (Signed)
Pt complains of headache, vomiting for the past 3 days. Pt states he is sensitive to light.

## 2016-02-11 NOTE — ED Provider Notes (Signed)
CSN: [id number]     Arrival date & time 02/11/16  1554 History   First MD Initiated Contact with Patient 02/11/16 1615     Chief Complaint  Patient presents with  . Headache  . Emesis     (Consider location/radiation/quality/duration/timing/severity/associated sxs/prior Treatment) HPI Comments: Patient presents to the ED with a chief complaint of headache.  He states that he has had a severe, gradually progressing headache for the past 3 days.  He denies a history of headaches.  He states that his sister became concerned about the severity of the headache and the persistent nature.  He reports associated photophobia and nausea and vomiting.  He denies any fevers or chills.  Additionally, he does complain of some sinus congestion.  There are no other aggravating or alleviating factors.  The history is provided by the patient. No language interpreter was used.    Past Medical History  Diagnosis Date  . Hypertension   . Depression   . PTSD (post-traumatic stress disorder)   . Bipolar 1 disorder (HCC)    History reviewed. No pertinent past surgical history. Family History  Problem Relation Age of Onset  . Heart attack Other    Social History  Substance Use Topics  . Smoking status: Current Every Day Smoker -- 1.00 packs/day for 30 years    Types: Cigarettes  . Smokeless tobacco: Never Used  . Alcohol Use: 1.2 oz/week    2 Cans of beer per week     Comment: daily alcohol    Review of Systems  Constitutional: Negative for fever and chills.  Respiratory: Negative for shortness of breath.   Cardiovascular: Negative for chest pain.  Gastrointestinal: Positive for nausea, vomiting and diarrhea. Negative for constipation.  Genitourinary: Negative for dysuria.  Neurological: Positive for headaches.  All other systems reviewed and are negative.     Allergies  Review of patient's allergies indicates no known allergies.  Home Medications   Prior to Admission medications    Medication Sig Start Date End Date Taking? Authorizing Provider  atenolol (TENORMIN) 50 MG tablet TAKE 50 MG BY MOUTH DAILY 06/24/15   Thermon Leyland, NP  atomoxetine (STRATTERA) 25 MG capsule Take 1 capsule (25 mg total) by mouth daily. Patient not taking: Reported on 07/27/2015 06/24/15   Thermon Leyland, NP  carbamazepine (TEGRETOL) 200 MG tablet Take 1 tablet (200 mg total) by mouth 2 (two) times daily. Patient not taking: Reported on 07/27/2015 06/24/15   Thermon Leyland, NP  citalopram (CELEXA) 20 MG tablet Take 1 tablet (20 mg total) by mouth daily. Patient not taking: Reported on 07/27/2015 06/24/15   Thermon Leyland, NP  enalapril (VASOTEC) 2.5 MG tablet Take 1 tablet (2.5 mg total) by mouth daily. 06/24/15   Thermon Leyland, NP  HYDROcodone-acetaminophen (NORCO/VICODIN) 5-325 MG per tablet Take 2 tablets by mouth every 4 (four) hours as needed for moderate pain or severe pain. 07/27/15   Lavera Guise, MD  Multiple Vitamin (MULTIVITAMIN WITH MINERALS) TABS tablet Take 1 tablet by mouth daily. Patient not taking: Reported on 07/27/2015 06/24/15   Thermon Leyland, NP  naproxen (NAPROSYN) 500 MG tablet Take 1 tablet (500 mg total) by mouth 2 (two) times daily. 07/27/15   Lavera Guise, MD  pravastatin (PRAVACHOL) 40 MG tablet Take 1 tablet (40 mg total) by mouth daily. 06/24/15   Thermon Leyland, NP  prazosin (MINIPRESS) 2 MG capsule Take 2 capsules (4 mg total) by mouth at bedtime. 06/24/15  Thermon Leyland, NP  thiamine 100 MG tablet Take 1 tablet (100 mg total) by mouth daily. 06/24/15   Thermon Leyland, NP  traZODone (DESYREL) 50 MG tablet Take 1 tablet (50 mg total) by mouth at bedtime as needed for sleep (May repeat x1). 06/24/15   Thermon Leyland, NP  VITAMIN D, CHOLECALCIFEROL, PO Take 1 tablet by mouth daily.    Historical Provider, MD   BP 137/91 mmHg  Pulse 73  Temp(Src) 97.8 F (36.6 C) (Oral)  Resp 18  SpO2 97% Physical Exam  Constitutional: He is oriented to person, place, and time. He appears well-developed and  well-nourished.  HENT:  Head: Normocephalic and atraumatic.  Right Ear: External ear normal.  Left Ear: External ear normal.  Eyes: Conjunctivae and EOM are normal. Pupils are equal, round, and reactive to light.  Neck: Normal range of motion. Neck supple.  No pain with neck flexion, no meningismus  Cardiovascular: Normal rate, regular rhythm and normal heart sounds.  Exam reveals no gallop and no friction rub.   No murmur heard. Pulmonary/Chest: Effort normal and breath sounds normal. No respiratory distress. He has no wheezes. He has no rales. He exhibits no tenderness.  Abdominal: Soft. He exhibits no distension and no mass. There is no tenderness. There is no rebound and no guarding.  Musculoskeletal: Normal range of motion. He exhibits no edema or tenderness.  Normal gait.  Neurological: He is alert and oriented to person, place, and time. He has normal reflexes.  CN 3-12 intact, normal finger to nose, no pronator drift, sensation and strength intact bilaterally.  Skin: Skin is warm and dry.  Psychiatric: He has a normal mood and affect. His behavior is normal. Judgment and thought content normal.  Nursing note and vitals reviewed.   ED Course  Procedures (including critical care time)                                                                                                                                                                                                                       Ct Head Wo Contrast  02/11/2016  CLINICAL DATA:  Headache for 3 days.  Vomiting. EXAM: CT HEAD WITHOUT CONTRAST TECHNIQUE: Contiguous axial images were obtained from the base of the skull through the vertex without intravenous contrast. COMPARISON:  06/15/2015 head CT. FINDINGS: No evidence of parenchymal hemorrhage or extra-axial fluid collection. No mass lesion, mass effect, or midline shift. No CT evidence of acute infarction. Cerebral volume  is  age appropriate. No ventriculomegaly. Mucosal thickening in the left greater than right visualized bilateral maxillary sinuses and bilateral mastoid air cells. No fluid levels in the visualized paranasal sinuses. The mastoid air cells are unopacified. No evidence of calvarial fracture. IMPRESSION: 1.  No evidence of acute intracranial abnormality. 2. Bilateral paranasal sinusitis, probably chronic. Electronically Signed   By: Delbert Phenix M.D.   On: 02/11/2016 18:02    I have personally reviewed and evaluated these images and lab results as part of my medical decision-making.    MDM   Final diagnoses:  Nonintractable headache, unspecified chronicity pattern, unspecified headache type  Sinusitis, unspecified chronicity, unspecified location    Patient with headache.  Has never had a headache this severe before.  Will check CT head given age and no prior history, but suspect that symptoms are 2/2 sinus congestion and sinus headache.  Pt HA treated and improved while in ED.  Presentation is like pts typical HA and non concerning for [facility], [facility], Meningitis, or temporal arteritis. Pt is afebrile with no focal neuro deficits, nuchal rigidity, or change in vision. Pt is to follow up with PCP to discuss prophylactic medication. Pt verbalizes understanding and is agreeable with plan to dc.     Roxy Horseman, PA-C 02/11/16 1904  Lavera Guise, MD 02/12/16 1240

## 2016-06-18 ENCOUNTER — Encounter (HOSPITAL_COMMUNITY): Payer: Self-pay | Admitting: Emergency Medicine

## 2016-06-18 ENCOUNTER — Emergency Department (HOSPITAL_COMMUNITY)
Admission: EM | Admit: 2016-06-18 | Discharge: 2016-06-20 | Disposition: A | Discharge status: Other Acute Inpatient Hospital | Payer: Medicaid Other | Attending: Emergency Medicine | Admitting: Emergency Medicine

## 2016-06-18 ENCOUNTER — Inpatient Hospital Stay (HOSPITAL_COMMUNITY): Admission: RE | Admit: 2016-06-18 | Payer: Medicaid Other | Source: Intra-hospital | Admitting: Psychiatry

## 2016-06-18 DIAGNOSIS — F1290 Cannabis use, unspecified, uncomplicated: Secondary | ICD-10-CM | POA: Insufficient documentation

## 2016-06-18 DIAGNOSIS — Z79899 Other long term (current) drug therapy: Secondary | ICD-10-CM | POA: Insufficient documentation

## 2016-06-18 DIAGNOSIS — F32A Depression, unspecified: Secondary | ICD-10-CM

## 2016-06-18 DIAGNOSIS — R45851 Suicidal ideations: Secondary | ICD-10-CM

## 2016-06-18 DIAGNOSIS — I10 Essential (primary) hypertension: Secondary | ICD-10-CM | POA: Insufficient documentation

## 2016-06-18 DIAGNOSIS — F329 Major depressive disorder, single episode, unspecified: Secondary | ICD-10-CM | POA: Insufficient documentation

## 2016-06-18 DIAGNOSIS — F17210 Nicotine dependence, cigarettes, uncomplicated: Secondary | ICD-10-CM | POA: Insufficient documentation

## 2016-06-18 DIAGNOSIS — F1920 Other psychoactive substance dependence, uncomplicated: Secondary | ICD-10-CM | POA: Diagnosis not present

## 2016-06-18 LAB — COMPREHENSIVE METABOLIC PANEL
ALT: 23 U/L (ref 17–63)
AST: 24 U/L (ref 15–41)
Albumin: 4.4 g/dL (ref 3.5–5.0)
Alkaline Phosphatase: 59 U/L (ref 38–126)
Anion gap: 8 (ref 5–15)
BUN: 13 mg/dL (ref 6–20)
CO2: 27 mmol/L (ref 22–32)
Calcium: 9.3 mg/dL (ref 8.9–10.3)
Chloride: 102 mmol/L (ref 101–111)
Creatinine, Ser: 1.03 mg/dL (ref 0.61–1.24)
GFR calc Af Amer: >60 mL/min (ref >60)
GFR calc non Af Amer: >60 mL/min (ref >60)
Glucose, Bld: 103 mg/dL — ABNORMAL HIGH (ref 65–99)
Potassium: 4.2 mmol/L (ref 3.5–5.1)
Sodium: 137 mmol/L (ref 135–145)
Total Bilirubin: 0.8 mg/dL (ref 0.3–1.2)
Total Protein: 7.5 g/dL (ref 6.5–8.1)

## 2016-06-18 LAB — SALICYLATE LEVEL: Salicylate Lvl: <4 mg/dL (ref 2.8–30.0)

## 2016-06-18 LAB — CBC
HCT: 46.5 % (ref 39.0–52.0)
Hemoglobin: 16.3 g/dL (ref 13.0–17.0)
MCH: 31.4 pg (ref 26.0–34.0)
MCHC: 35.1 g/dL (ref 30.0–36.0)
MCV: 89.6 fL (ref 78.0–100.0)
Platelets: 244 10*3/uL (ref 150–400)
RBC: 5.19 MIL/uL (ref 4.22–5.81)
RDW: 12.9 % (ref 11.5–15.5)
WBC: 10.1 10*3/uL (ref 4.0–10.5)

## 2016-06-18 LAB — ETHANOL: Alcohol, Ethyl (B): <5 mg/dL (ref <5)

## 2016-06-18 LAB — ACETAMINOPHEN LEVEL: Acetaminophen (Tylenol), Serum: <10 ug/mL — ABNORMAL LOW (ref 10–30)

## 2016-06-18 MED ORDER — VITAMIN B-1 100 MG PO TABS
100.0000 mg | ORAL_TABLET | Freq: Every day | ORAL | Status: DC
  Administered 2016-06-18 – 2016-06-20 (×3): 100 mg via ORAL
  Filled 2016-06-18 (×3): qty 1

## 2016-06-18 MED ORDER — IBUPROFEN 200 MG PO TABS: 600.0000 mg | ORAL_TABLET | Freq: Three times a day (TID) | ORAL | Status: DC | PRN

## 2016-06-18 MED ORDER — DIAZEPAM 5 MG PO TABS
5.0000 mg | ORAL_TABLET | Freq: Once | ORAL | Status: AC
  Administered 2016-06-18: 5 mg via ORAL
  Filled 2016-06-18: qty 1

## 2016-06-18 MED ORDER — ATENOLOL 50 MG PO TABS
50.0000 mg | ORAL_TABLET | Freq: Every day | ORAL | Status: DC
  Administered 2016-06-18 – 2016-06-20 (×3): 50 mg via ORAL
  Filled 2016-06-18 (×3): qty 1

## 2016-06-18 MED ORDER — LORAZEPAM 1 MG PO TABS
0.0000 mg | ORAL_TABLET | Freq: Four times a day (QID) | ORAL | Status: DC
  Administered 2016-06-18 – 2016-06-19 (×2): 2 mg via ORAL
  Administered 2016-06-20: 1 mg via ORAL
  Filled 2016-06-18 (×3): qty 2

## 2016-06-18 MED ORDER — LORAZEPAM 1 MG PO TABS
0.0000 mg | ORAL_TABLET | Freq: Two times a day (BID) | ORAL | Status: DC
  Administered 2016-06-20: 2 mg via ORAL

## 2016-06-18 MED ORDER — ONDANSETRON HCL 4 MG PO TABS
4.0000 mg | ORAL_TABLET | Freq: Three times a day (TID) | ORAL | Status: DC | PRN
  Administered 2016-06-18 – 2016-06-19 (×2): 4 mg via ORAL
  Filled 2016-06-18 (×2): qty 1

## 2016-06-18 MED ORDER — NICOTINE 21 MG/24HR TD PT24
21.0000 mg | MEDICATED_PATCH | Freq: Every day | TRANSDERMAL | Status: DC
  Filled 2016-06-18: qty 1

## 2016-06-18 MED ORDER — THIAMINE HCL 100 MG/ML IJ SOLN: 100.0000 mg | Freq: Every day | INTRAMUSCULAR | Status: DC

## 2016-06-18 MED ORDER — LISINOPRIL 5 MG PO TABS
5.0000 mg | ORAL_TABLET | Freq: Every day | ORAL | Status: DC
  Administered 2016-06-18 – 2016-06-20 (×3): 5 mg via ORAL
  Filled 2016-06-18 (×3): qty 1

## 2016-06-18 NOTE — ED Provider Notes (Signed)
CSN: [id number]     Arrival date & time 06/18/16  1644 History   First MD Initiated Contact with Patient 06/18/16 1651     Chief Complaint  Patient presents with  . Suicidal     (Consider location/radiation/quality/duration/timing/severity/associated sxs/prior Treatment) HPI   50 year old male with depression and suicidal ideation. Patient reports thoughts of [place] [address]. Worsening thoughts for the past several months. He reports that his primary caretaker of his mother died 15 months ago. Apparently she died from complications after being struck by a drunk driver. At times his had homicidal thoughts of the person that hit her. He has a past history of bipolar, depression and PTSD. He has been moving locations and does not have a local provider to write him prescriptions. He relocated to [place] because his brother lives here. He has a tumultuous relationship with his brother and reports that his brother has issues with substance abuse. He is currently on disability and Receives $700+/month. 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 This was about 3 weeks ago. His He drinks alcohol daily. He has been drinking heavily since his mother died. He occasionally uses cocaine and marijuana.  Past Medical History  Diagnosis Date  . Hypertension   . Depression   . PTSD (post-traumatic stress disorder)   . Bipolar 1 disorder (HCC)    History reviewed. No pertinent past surgical history. Family History  Problem Relation Age of Onset  . Heart attack Other    Social History  Substance Use Topics  . Smoking status: Current Every Day Smoker -- 1.00 packs/day for 30 years    Types: Cigarettes  . Smokeless tobacco: Never Used  . Alcohol Use:  1.2 oz/week    2 Cans of beer per week     Comment: daily alcohol    Review of Systems    Allergies  Review of patient's allergies indicates no known allergies.  Home Medications   Prior to Admission medications   Medication Sig Start Date End Date Taking? Authorizing Provider  atenolol (TENORMIN) 50 MG tablet TAKE 50 MG BY MOUTH DAILY 06/24/15  Yes Thermon LeylandLaura A Schappell, NP  lisinopril (PRINIVIL,ZESTRIL) 5 MG tablet Take 5 mg by mouth daily. 05/04/15  Yes Historical Provider, MD  amoxicillin-clavulanate (AUGMENTIN) 875-125 MG tablet Take 1 tablet by mouth every 12 (twelve) hours. Patient not taking: Reported on 06/18/2016 02/11/16   Roxy Horsemanobert Browning, PA-C   BP 139/98 mmHg  Pulse 81  Temp(Src) 98.1 F (36.7 C) (Oral)  Resp 16  SpO2 100% Physical Exam  Constitutional: He appears well-developed and well-nourished. No distress.  HENT:  Head: Normocephalic and atraumatic.  Eyes: Conjunctivae are normal. Right eye exhibits no discharge. Left eye exhibits no discharge.  Neck: Neck supple.  Cardiovascular: Normal rate, regular rhythm and normal heart sounds.  Exam reveals no gallop and no friction rub.   No murmur heard. Pulmonary/Chest: Effort normal and breath sounds normal. No respiratory distress.  Abdominal: Soft. He exhibits no distension. There is no tenderness.  Musculoskeletal: He exhibits no edema or tenderness.  Neurological: He is alert.  Skin: Skin is warm and dry.  Psychiatric: He has a normal mood and affect. His behavior is normal. Thought content normal.  ,.  Cooperative. Speech is clear. Content is appropriate. Makes decent eye contact. Does not appear to be responding to internal stimuli.  Nursing note and vitals reviewed.   ED Course  Procedures (including critical care time) Labs Review Labs Reviewed  COMPREHENSIVE METABOLIC PANEL - Abnormal; Notable for the following:    Glucose, Bld 103 (*)    All other components within normal limits  ACETAMINOPHEN LEVEL -  Abnormal; Notable for the following:    Acetaminophen (Tylenol), Serum <10 (*)    All other components within normal limits  ETHANOL  SALICYLATE LEVEL  CBC  URINE RAPID DRUG SCREEN, HOSP PERFORMED    Imaging Review No results found. I have personally reviewed and evaluated these images and lab results as part of my medical decision-making.   EKG Interpretation None      MDM   Final diagnoses:  Depression  Suicidal thoughts    50 year old male with alcohol abuse and depression. Suicidal thoughts. Medically cleared at this time. TTS evaluation.    Raeford RazorStephen Linsey Arteaga, MD 06/21/16 1229

## 2016-06-18 NOTE — ED Notes (Addendum)
Pt admitted to room #41. Pt reports he is at the hospital d/t he is "tired" Pt endorsing SI with plan to [place]. Pt denies HI. Denies AVH. Pt reports alcohol and marijuana use. Pt reports withdrawal symptoms including anxiety, nausea. Pt reports he is currently living in a hotel and is looking for an apartment. Reports his mother passed away in Feb 2016. Pt reports he has been off his medication since his mother has passed away. In the middle of asking assessment questions pt began to vomit. Special checks q 15 mins in place for safety. Video monitoring in place.

## 2016-06-18 NOTE — ED Notes (Signed)
Pt is alert and oriented x4. Pt endorses severe depression and anxiety; states, "I tried to do it on my own but it could be hard sometime. Pt at this time still endorses passive SI; "if you guys suddenly say I could leave now I probably will do something to myself." Pt also complained of mild cramping and withdrawal symptoms (sweating, tremors). Pt denies HI or AVH. Pt remained pleasant, calm and cooperative through the shift assessment.

## 2016-06-18 NOTE — BH Assessment (Addendum)
Tele Assessment Note   Patient is a 50 year old white male that reports suicidal ideation with a [place].  Patient reports increased depression associated with the death of his mother in 02/05/2015.  Patient reports that he is not able to contract for safety.  Documentation in the epic chart reports that the patient states, "If you guys suddenly say I could leave now I probably will do something to myself."  Patient reports a previous suicide attempt five years ago when his mother was ill.  Patient reports a previous inpatient psychiatric hospitalization five years ago.  Pt reports he has been off his medication since his mother has passed away.  Pt reports he is currently living in a hotel and is looking for an apartment and does not have any family or social supports.  Patient reports that he is currently unemployed but he was working in [employer] a couple of weeks ago.   Patient reports increased drinking.  Patient reports that he usually drinks 3-4 forty-ounce beers per day.  Patient reports that his last drink was yesterday when he had 2 (50oz) beer.  Patient endorsed withdrawal symptoms.  Patient denies a history of seizures.  Patient reports a prior inpatient psychiatric hospitalization at [facility] in 05-06-15.  Upon chart review the patient was hospitalized at [facility] in 2012, 2013 and 05-May-2014.    Patient denies HI and Psychosis.   Diagnosis: Alcohol Abuse and Bipolar Disorder   Past Medical History:  Past Medical History  Diagnosis Date  . Hypertension   . Depression   . PTSD (post-traumatic stress disorder)   . Bipolar 1 disorder (HCC)     History reviewed. No pertinent past surgical history.  Family History:  Family History  Problem Relation Age of Onset  . Heart attack Other     Social History:  reports that he has been smoking Cigarettes.  He has a 30 pack-year smoking history. He has never used smokeless tobacco. He reports that he drinks about 1.2 oz of  alcohol per week. He reports that he uses illicit drugs (Marijuana).  Additional Social History:  Alcohol / Drug Use History of alcohol / drug use?: Yes Longest period of sobriety (when/how long): Alcohol Negative Consequences of Use: Financial, Personal relationships, Work / Programmer, multimedia Withdrawal Symptoms: Tingling, Weakness, Nausea / Vomiting, Patient aware of relationship between substance abuse and physical/medical complications, Sweats, Cramps, Fever / Chills Substance #1 Name of Substance 1: Alcohol 1 - Age of First Use: 50 years old 1 - Amount (size/oz): 3 -4 (40oz) Beer  1 - Frequency: Daily  1 - Duration: Since his mother died in [place] 05-06-2015 1 - Last Use / Amount: Yesterday when he drank 2 (40oz) beer   CIWA: CIWA-Ar BP: 139/98 mmHg Pulse Rate: 81 Nausea and Vomiting: 6 Tactile Disturbances: none Tremor: two Auditory Disturbances: not present Paroxysmal Sweats: no sweat visible Visual Disturbances: not present Anxiety: three Headache, Fullness in Head: none present Agitation: normal activity Orientation and Clouding of Sensorium: oriented and can do serial additions CIWA-Ar Total: 11 COWS:    PATIENT STRENGTHS: (choose at least two) Average or above average intelligence Communication skills Physical Health  Allergies: No Known Allergies  Home Medications:  (Not in a hospital admission)  OB/GYN Status:  No LMP for male patient.  General Assessment Data Location of Assessment: [facility] ED TTS Assessment: In system Is this a Tele or Face-to-Face Assessment?: Tele Assessment Is this an Initial Assessment or a  Re-assessment for this encounter?: Initial Assessment Marital status: Single Maiden name: NA Is patient pregnant?: No Pregnancy Status: No Living Arrangements: Alone Can pt return to current living arrangement?: Yes Admission Status: Voluntary Is patient capable of signing voluntary admission?: Yes Referral Source: Self/Family/Friend Insurance type: Medicaid      Crisis Care Plan Living Arrangements: Alone Legal Guardian:  (NA) Name of Psychiatrist: None Reported Name of Therapist: None Reported  Education Status Is patient currently in school?: No Current Grade: NA Highest grade of school patient has completed: NA Name of school: NA Contact person: NA  Risk to self with the past 6 months Suicidal Ideation: Yes-Currently Present Has patient been a risk to self within the past 6 months prior to admission? : Yes Suicidal Intent: Yes-Currently Present Has patient had any suicidal intent within the past 6 months prior to admission? : Yes Is patient at risk for suicide?: Yes Suicidal Plan?: Yes-Currently Present Has patient had any suicidal plan within the past 6 months prior to admission? : Yes Specify Current Suicidal Plan: Plan to jump into oncoming traffic  Access to Means: Yes Specify Access to Suicidal Means: Plan to jump into oncoming traffic  What has been your use of drugs/alcohol within the last 12 months?: Alcohol Previous Attempts/Gestures: Yes How many times?: 1 Other Self Harm Risks: None Reported Triggers for Past Attempts: Family contact (Past attempt occured 5 years ago when heis mom was sick) Intentional Self Injurious Behavior: None Family Suicide History: No Recent stressful life event(s): Loss (Comment), Job Loss, Financial Problems ([place] Persecutory voices/beliefs?: No Depression: Yes Depression Symptoms: Despondent, Tearfulness, Isolating, Fatigue, Guilt, Loss of interest in usual pleasures, Feeling worthless/self pity Substance abuse history and/or treatment for substance abuse?: Yes Suicide prevention information given to non-admitted patients: Yes  Risk to Others within the past 6 months Homicidal Ideation: No Does patient have any lifetime risk of violence toward others beyond the six months prior to admission? : No Thoughts of Harm to Others: No Current Homicidal Intent:  No Current Homicidal Plan: No Access to Homicidal Means: No Identified Victim: None Reported History of harm to others?: No Assessment of Violence: None Noted Violent Behavior Description: None  Does patient have access to weapons?: No Criminal Charges Pending?: No Does patient have a court date: No Is patient on probation?: No  Psychosis Hallucinations: None noted Delusions: None noted  Mental Status Report Appearance/Hygiene: In scrubs Eye Contact: Poor Motor Activity: Freedom of movement, Restlessness Speech: Logical/coherent Level of Consciousness: Alert, Restless Mood: Depressed, Anxious, Despair, Guilty, Helpless, Worthless, low self-esteem Affect: Anxious, Depressed, Sad Anxiety Level: Minimal Thought Processes: Coherent, Relevant Judgement: Impaired Orientation: Person, Place, Time, Situation Obsessive Compulsive Thoughts/Behaviors: None  Cognitive Functioning Concentration: Decreased Memory: Recent Intact, Remote Intact IQ: Average Insight: Fair Impulse Control: Poor Appetite: Fair Weight Loss: 0 Weight Gain: 0 Sleep: Decreased Total Hours of Sleep: 6 Vegetative Symptoms: Decreased grooming, Staying in bed  ADLScreening [facility]) Patient's cognitive ability adequate to safely complete daily activities?: Yes Patient able to express need for assistance with ADLs?: Yes Independently performs ADLs?: Yes (appropriate for developmental age)  Prior Inpatient Therapy Prior Inpatient Therapy: Yes Prior Therapy Dates: 2016 [facility] Reason for Treatment: SI/SA  Prior Outpatient Therapy Prior Outpatient Therapy: Yes Prior Therapy Dates: Unable to remember Prior Therapy Facilty/Provider(s): Unable to remember  Reason for Treatment: Depression - Medication Management Does patient have an ACCT team?: No Does patient have Intensive In-House Services?  :  No Does patient have [facility] services? : No Does patient have  P4CC services?: No  ADL Screening (condition at time of admission) Patient's cognitive ability adequate to safely complete daily activities?: Yes Is the patient deaf or have difficulty hearing?: No Does the patient have difficulty seeing, even when wearing glasses/contacts?: No Does the patient have difficulty concentrating, remembering, or making decisions?: No Patient able to express need for assistance with ADLs?: Yes Does the patient have difficulty dressing or bathing?: No Independently performs ADLs?: Yes (appropriate for developmental age) Does the patient have difficulty walking or climbing stairs?: No Weakness of Legs: None Weakness of Arms/Hands: None  Home Assistive Devices/Equipment Home Assistive Devices/Equipment: None    Abuse/Neglect Assessment (Assessment to be complete while patient is alone) Physical Abuse: Denies Verbal Abuse: Denies Sexual Abuse: Denies Exploitation of patient/patient's resources: Denies Self-Neglect: Denies Values / Beliefs Cultural Requests During Hospitalization: None Spiritual Requests During Hospitalization: None Consults Spiritual Care Consult Needed: No Social Work Consult Needed: No Merchant navy officerAdvance Directives (For Healthcare) Does patient have an advance directive?: No Would patient like information on creating an advanced directive?: No - patient declined information    Additional Information 1:1 In Past 12 Months?: No CIRT Risk: No Elopement Risk: No Does patient have medical clearance?: Yes     Disposition: Per Donell SievertSpencer Simon, PA - patient meets criteria for inpatient hospitalization.  [facility](Tori) [facility].  Disposition Initial Assessment Completed for this Encounter: Yes  Linton RumpStevenson, Chauntelle Azpeitia LaVerne 06/18/2016 11:37 PM

## 2016-06-18 NOTE — ED Notes (Addendum)
Pt c/o suicidal ideation with plan and intent to jump into traffic x several months. No HI/AVH. Last alcohol drink yesterday. No Hx etoh withdrawal seizures. Pt usually drinks 3-4 forty-ounce beers per day. Pt reports blood in saliva. Pt out of seroquel and minipress for PTSD, has been having nightmares. Mother recently died. No recent SI attempts.

## 2016-06-19 DIAGNOSIS — F1920 Other psychoactive substance dependence, uncomplicated: Secondary | ICD-10-CM | POA: Diagnosis not present

## 2016-06-19 LAB — RAPID URINE DRUG SCREEN, HOSP PERFORMED
Amphetamines: NOT DETECTED
Barbiturates: NOT DETECTED
Benzodiazepines: POSITIVE — AB
Cocaine: POSITIVE — AB
Opiates: NOT DETECTED
Tetrahydrocannabinol: POSITIVE — AB

## 2016-06-19 MED ORDER — CARBAMAZEPINE 200 MG PO TABS
200.0000 mg | ORAL_TABLET | Freq: Two times a day (BID) | ORAL | Status: DC
  Administered 2016-06-19 – 2016-06-20 (×3): 200 mg via ORAL
  Filled 2016-06-19 (×4): qty 1

## 2016-06-19 NOTE — ED Notes (Signed)
Patient denies SI, HI and AVH a this time. Patient is calm and cooperative. Plan of care discussed. Patient voices no complaints or concerns at this time. Encouragement and support provided and safety maintain. q 15 min safety checks remain in place.

## 2016-06-19 NOTE — BH Assessment (Signed)
Per Donell SievertSpencer Simon, PA - patient meets criteria for inpatient hospitalization. [facility](Tori[facility]   TTS will refer to other facilities.

## 2016-06-19 NOTE — Consult Note (Signed)
[facility] Consult   Reason for Consult:  Suicidal ideation, jumping off bridge  Referring Physician:  EDP Patient Identification: Chase Moss MRN:  [id number] Principal Diagnosis: Polysubstance (including opioids) dependence with physiological dependence [facility]) Diagnosis:   Patient Active Problem List   Diagnosis Date Noted  . Polysubstance (including opioids) dependence with physiological dependence ([place]) [F19.20] 06/19/2016  . Scapula fracture [S42.109A] 07/27/2015  . Trauma [T14.90] 07/27/2015  . Major depressive disorder, recurrent, severe without psychotic features ([place]) [F33.2]   . Alcohol dependence with uncomplicated withdrawal ([place]) [F10.230]   . Major depressive disorder ([place]) [F32.9] 06/18/2015  . Bipolar 1 disorder, mixed, moderate ([place]) [F31.62]   . Moderate cannabis use disorder [F12.90]   . PTSD (post-traumatic stress disorder) [F43.10]   . Polysubstance abuse [F19.10] 07/07/2014  . Substance induced mood disorder ([place]) [F19.94] 07/07/2014  . Bipolar I disorder, most recent episode mixed ([place]) [F31.60] 03/15/2014  . MDD (major depressive disorder), recurrent episode, severe ([place]) [F33.2] 03/12/2014  . Alcohol dependence ([place]) [F10.20] 04/10/2012  . Suicidal thoughts [R45.851] 04/04/2012  . Post traumatic stress disorder (PTSD) [F43.10] 04/04/2012  . Depression [F32.9] 04/04/2012    Total Time spent with patient: 30 minutes  Subjective:   Arben Packman is a 50 y.o. male patient admitted with .  HPI:  [name], a 50 year old white male that reports suicidal ideation with [place] [address] [place]. Patient reports increased depression associated with the death of his mother in 2015-03-11.  "I stay in a hotel, I'm tired of failing all the time." Patient reports a previous suicide attempt five years ago when his mother was ill. Patient reports a previous inpatient psychiatric hospitalization five years ago. Pt reports he has been off his  medication since his mother has passed away.  "For about a year, I ran out of them."  Pt reports he is currently living in a hotel and is looking for an apartment and does not have any family or social supports. Patient reports that he is currently unemployed but he was working in [employer] a couple of weeks ago.   Patient reports increased drinking. Patient reports that he usually drinks 3-4 forty-ounce beers per day. Patient reports that his last drink was yesterday when he had 2 (50oz) beer. Patient endorsed withdrawal symptoms. Patient denies a history of seizures. Patient reports a prior inpatient psychiatric hospitalization at [facility] in Mar 11, 2015. Upon chart review the patient was hospitalized at [facility] in 2012, 2013 and 03/11/14.  Patient denies HI and Psychosis.  UDS positive for cocaine, THC and BZD.  Past Psychiatric History:  See HPI  Risk to Self: Suicidal Ideation: Yes-Currently Present Suicidal Intent: Yes-Currently Present Is patient at risk for suicide?: Yes Suicidal Plan?: Yes-Currently Present Specify Current Suicidal Plan: Plan to jump into oncoming traffic  Access to Means: Yes Specify Access to Suicidal Means: Plan to jump into oncoming traffic  What has been your use of drugs/alcohol within the last 12 months?: Alcohol How many times?: 1 Other Self Harm Risks: None Reported Triggers for Past Attempts: Family contact (Past attempt occured 5 years ago when heis mom was sick) Intentional Self Injurious Behavior: None Risk to Others: Homicidal Ideation: No Thoughts of Harm to Others: No Current Homicidal Intent: No Current Homicidal Plan: No Access to Homicidal Means: No Identified Victim: None Reported History of harm to others?: No Assessment of Violence: None Noted Violent Behavior Description: None  Does patient have access to weapons?: No Criminal Charges  Pending?: No Does patient have a court date: No Prior Inpatient Therapy: Prior Inpatient Therapy: Yes Prior  Therapy Dates: 2016 Prior Therapy Facilty/Provider(s): [facility] Reason for Treatment: SI/SA Prior Outpatient Therapy: Prior Outpatient Therapy: Yes Prior Therapy Dates: Unable to remember Prior Therapy Facilty/Provider(s): Unable to remember  Reason for Treatment: Depression - Medication Management Does patient have an ACCT team?: No Does patient have Intensive In-House Services?  : No Does patient have [facility] services? : No Does patient have P4CC services?: No  Past Medical History:  Past Medical History  Diagnosis Date  . Hypertension   . Depression   . PTSD (post-traumatic stress disorder)   . Bipolar 1 disorder ([place])    History reviewed. No pertinent past surgical history. Family History:  Family History  Problem Relation Age of Onset  . Heart attack Other    Family Psychiatric  History:  See HPI Social History:  History  Alcohol Use  . 1.2 oz/week  . 2 Cans of beer per week    Comment: daily alcohol     History  Drug Use  . Yes  . Special: Marijuana    Comment: several times a day    Social History   Social History  . Marital Status: Divorced    Spouse Name: N/A  . Number of Children: N/A  . Years of Education: N/A   Social History Main Topics  . Smoking status: Current Every Day Smoker -- 1.00 packs/day for 30 years    Types: Cigarettes  . Smokeless tobacco: Never Used  . Alcohol Use: 1.2 oz/week    2 Cans of beer per week     Comment: daily alcohol  . Drug Use: Yes    Special: Marijuana     Comment: several times a day  . Sexual Activity: Not Asked   Other Topics Concern  . None   Social History Narrative   Additional Social History:    Allergies:  No Known Allergies  Labs:  Results for orders placed or performed during the hospital encounter of 06/18/16 (from the past 48 hour(s))  Comprehensive metabolic panel     Status: Abnormal   Collection Time: 06/18/16  5:39 PM  Result Value Ref Range   Sodium 137 135 - 145 mmol/L   Potassium 4.2  3.5 - 5.1 mmol/L   Chloride 102 101 - 111 mmol/L   CO2 27 22 - 32 mmol/L   Glucose, Bld 103 (H) 65 - 99 mg/dL   BUN 13 6 - 20 mg/dL   Creatinine, Ser 1.03 0.61 - 1.24 mg/dL   Calcium 9.3 8.9 - 10.3 mg/dL   Total Protein 7.5 6.5 - 8.1 g/dL   Albumin 4.4 3.5 - 5.0 g/dL   AST 24 15 - 41 U/L   ALT 23 17 - 63 U/L   Alkaline Phosphatase 59 38 - 126 U/L   Total Bilirubin 0.8 0.3 - 1.2 mg/dL   GFR calc non Af Amer >60 >60 mL/min   GFR calc Af Amer >60 >60 mL/min    Comment: (NOTE) The eGFR has been calculated using the CKD EPI equation. This calculation has not been validated in all clinical situations. eGFR's persistently <60 mL/min signify possible Chronic Kidney Disease.    Anion gap 8 5 - 15  Ethanol     Status: None   Collection Time: 06/18/16  5:39 PM  Result Value Ref Range   Alcohol, Ethyl (B) <5 <5 mg/dL    Comment:  LOWEST DETECTABLE LIMIT FOR SERUM ALCOHOL IS 5 mg/dL FOR MEDICAL PURPOSES ONLY   Salicylate level     Status: None   Collection Time: 06/18/16  5:39 PM  Result Value Ref Range   Salicylate Lvl <9.4 2.8 - 30.0 mg/dL  Acetaminophen level     Status: Abnormal   Collection Time: 06/18/16  5:39 PM  Result Value Ref Range   Acetaminophen (Tylenol), Serum <10 (L) 10 - 30 ug/mL    Comment:        THERAPEUTIC CONCENTRATIONS VARY SIGNIFICANTLY. A RANGE OF 10-30 ug/mL MAY BE AN EFFECTIVE CONCENTRATION FOR MANY PATIENTS. HOWEVER, SOME ARE BEST TREATED AT CONCENTRATIONS OUTSIDE THIS RANGE. ACETAMINOPHEN CONCENTRATIONS >150 ug/mL AT 4 HOURS AFTER INGESTION AND >50 ug/mL AT 12 HOURS AFTER INGESTION ARE OFTEN ASSOCIATED WITH TOXIC REACTIONS.   cbc     Status: None   Collection Time: 06/18/16  5:39 PM  Result Value Ref Range   WBC 10.1 4.0 - 10.5 K/uL   RBC 5.19 4.22 - 5.81 MIL/uL   Hemoglobin 16.3 13.0 - 17.0 g/dL   HCT 46.5 39.0 - 52.0 %   MCV 89.6 78.0 - 100.0 fL   MCH 31.4 26.0 - 34.0 pg   MCHC 35.1 30.0 - 36.0 g/dL   RDW 12.9 11.5 - 15.5 %    Platelets 244 150 - 400 K/uL  Rapid urine drug screen (hospital performed)     Status: Abnormal   Collection Time: 06/19/16 12:56 AM  Result Value Ref Range   Opiates NONE DETECTED NONE DETECTED   Cocaine POSITIVE (A) NONE DETECTED   Benzodiazepines POSITIVE (A) NONE DETECTED   Amphetamines NONE DETECTED NONE DETECTED   Tetrahydrocannabinol POSITIVE (A) NONE DETECTED   Barbiturates NONE DETECTED NONE DETECTED    Comment:        DRUG SCREEN FOR MEDICAL PURPOSES ONLY.  IF CONFIRMATION IS NEEDED FOR ANY PURPOSE, NOTIFY LAB WITHIN 5 DAYS.        LOWEST DETECTABLE LIMITS FOR URINE DRUG SCREEN Drug Class       Cutoff (ng/mL) Amphetamine      1000 Barbiturate      200 Benzodiazepine   174 Tricyclics       081 Opiates          300 Cocaine          300 THC              50     Current Facility-Administered Medications  Medication Dose Route Frequency Provider Last Rate Last Dose  . atenolol (TENORMIN) tablet 50 mg  50 mg Oral Daily Virgel Manifold, MD   50 mg at 06/19/16 0911  . carbamazepine (TEGRETOL) tablet 200 mg  200 mg Oral BID PC Sabryna Lahm, MD   200 mg at 06/19/16 1149  . ibuprofen (ADVIL,MOTRIN) tablet 600 mg  600 mg Oral Q8H PRN Virgel Manifold, MD      . lisinopril (PRINIVIL,ZESTRIL) tablet 5 mg  5 mg Oral Daily Virgel Manifold, MD   5 mg at 06/19/16 0911  . LORazepam (ATIVAN) tablet 0-4 mg  0-4 mg Oral Q6H Virgel Manifold, MD   2 mg at 06/19/16 0032   Followed by  . [START ON 06/20/2016] LORazepam (ATIVAN) tablet 0-4 mg  0-4 mg Oral Q12H Virgel Manifold, MD      . nicotine (NICODERM CQ - dosed in mg/24 hours) patch 21 mg  21 mg Transdermal Daily Virgel Manifold, MD      . ondansetron [facility]) tablet  4 mg  4 mg Oral Q8H PRN Virgel Manifold, MD   4 mg at 06/19/16 1150  . thiamine (VITAMIN B-1) tablet 100 mg  100 mg Oral Daily Virgel Manifold, MD   100 mg at 06/19/16 4536   Or  . thiamine (B-1) injection 100 mg  100 mg Intravenous Daily Virgel Manifold, MD       Current Outpatient  Prescriptions  Medication Sig Dispense Refill  . atenolol (TENORMIN) 50 MG tablet TAKE 50 MG BY MOUTH DAILY    . lisinopril (PRINIVIL,ZESTRIL) 5 MG tablet Take 5 mg by mouth daily.    Marland Kitchen amoxicillin-clavulanate (AUGMENTIN) 875-125 MG tablet Take 1 tablet by mouth every 12 (twelve) hours. (Patient not taking: Reported on 06/18/2016) 14 tablet 0    Musculoskeletal: Strength & Muscle Tone: within normal limits Gait & Station: normal Patient leans: N/A  Psychiatric Specialty Exam: Physical Exam  Nursing note and vitals reviewed. Psychiatric: His mood appears anxious. His affect is labile. Thought content is not paranoid. He exhibits a depressed mood. He expresses no homicidal ideation.    Review of Systems  Psychiatric/Behavioral: Positive for depression and substance abuse. Negative for hallucinations and memory loss. The patient is nervous/anxious. The patient does not have insomnia.   All other systems reviewed and are negative.   Blood pressure 121/70, pulse 67, temperature 97.5 F (36.4 C), temperature source Oral, resp. rate 18, SpO2 98 %.There is no weight on file to calculate BMI.  General Appearance: Disheveled  Eye Contact:  Poor  Speech:  Clear and Coherent  Volume:  Normal  Mood:  Anxious, Depressed and Hopeless  Affect:  Appropriate  Thought Process:  Coherent  Orientation:  Full (Time, Place, and Person)  Thought Content:  Rumination  Suicidal Thoughts:  No  Homicidal Thoughts:  No  Memory:  Immediate;   Fair Recent;   Fair Remote;   Fair  Judgement:  Fair  Insight:  Fair  Psychomotor Activity:  Normal  Concentration:  Concentration: Fair and Attention Span: Fair  Recall:  [employer] of Knowledge:  Fair  Language:  Fair  Akathisia:  Negative  Handed:  Right  AIMS (if indicated):     Assets:  Desire for Improvement Resilience  ADL's:  Intact  Cognition:  WNL  Sleep:  poor   Treatment Plan Summary: Daily contact with patient to assess and evaluate symptoms  and progress in treatment, Medication management and Plan inpatient. -Medication management to re-stabilize current mood symptoms - Ativan CIWA protocol Tegretol 200 BID mood stabilization -Review and reinstate any pertinent home medications for other health problems -Labs reviewed - cocaine, THC, BZD on UDS -conferred were Dr Darleene Cleaver plan of care  Disposition: Recommend psychiatric Inpatient admission when medically cleared. Supportive therapy provided about ongoing stressors.  Janett Labella, NP [facility] 06/19/2016 12:41 PM Patient seen face-to-face for psychiatric evaluation, chart reviewed and case discussed with the physician extender and developed treatment plan. Reviewed the information documented and agree with the treatment plan. Corena Pilgrim, MD

## 2016-06-19 NOTE — ED Notes (Signed)
Pt has been in bed majority of the shift. Pt denies SI/HI. Denies AVH. Guarded, forwards little with this nurse,Compliant with medication regimen. Special checks q 15 mins in place for safety. Video monitoring in place.

## 2016-06-19 NOTE — BH Assessment (Signed)
[facility] Assessment Progress Note  Per Thedore MinsMojeed Akintayo, MD, this pt requires psychiatric hospitalization at this time.  The following facilities have been contacted to seek placement for this pt, with results as noted:  Beds available, information sent, decision pending:  [facility]   [facility]   Doylene Canninghomas Valory W[place], [place] Triage Specialist [phone]

## 2016-06-19 NOTE — Progress Notes (Signed)
Pt being reviewed for possible admission to [facility]. H&P and Assessment have been faxed to the [facility] for the charge nurse to review.    Nicole Graviel Payeur, MS, NCC, LPCA Therapeutic Triage Specialist    

## 2016-06-20 ENCOUNTER — Inpatient Hospital Stay
Admission: EM | Admit: 2016-06-20 | Discharge: 2016-06-25 | DRG: 885 | Disposition: A | Discharge status: Home / Self Care | Payer: Medicaid Other | Source: Intra-hospital | Attending: Psychiatry | Admitting: Psychiatry

## 2016-06-20 ENCOUNTER — Encounter: Payer: Self-pay | Admitting: Psychiatry

## 2016-06-20 DIAGNOSIS — I10 Essential (primary) hypertension: Secondary | ICD-10-CM | POA: Diagnosis present

## 2016-06-20 DIAGNOSIS — S0990XA Unspecified injury of head, initial encounter: Secondary | ICD-10-CM

## 2016-06-20 DIAGNOSIS — K219 Gastro-esophageal reflux disease without esophagitis: Secondary | ICD-10-CM | POA: Diagnosis present

## 2016-06-20 DIAGNOSIS — Z8249 Family history of ischemic heart disease and other diseases of the circulatory system: Secondary | ICD-10-CM

## 2016-06-20 DIAGNOSIS — F17210 Nicotine dependence, cigarettes, uncomplicated: Secondary | ICD-10-CM | POA: Diagnosis present

## 2016-06-20 DIAGNOSIS — F1020 Alcohol dependence, uncomplicated: Secondary | ICD-10-CM | POA: Diagnosis present

## 2016-06-20 DIAGNOSIS — F1220 Cannabis dependence, uncomplicated: Secondary | ICD-10-CM | POA: Diagnosis present

## 2016-06-20 DIAGNOSIS — F4310 Post-traumatic stress disorder, unspecified: Secondary | ICD-10-CM | POA: Diagnosis present

## 2016-06-20 DIAGNOSIS — F1994 Other psychoactive substance use, unspecified with psychoactive substance-induced mood disorder: Secondary | ICD-10-CM | POA: Diagnosis present

## 2016-06-20 DIAGNOSIS — F329 Major depressive disorder, single episode, unspecified: Secondary | ICD-10-CM | POA: Diagnosis not present

## 2016-06-20 DIAGNOSIS — G4700 Insomnia, unspecified: Secondary | ICD-10-CM | POA: Diagnosis present

## 2016-06-20 DIAGNOSIS — R45851 Suicidal ideations: Secondary | ICD-10-CM | POA: Diagnosis present

## 2016-06-20 DIAGNOSIS — F332 Major depressive disorder, recurrent severe without psychotic features: Secondary | ICD-10-CM | POA: Diagnosis not present

## 2016-06-20 DIAGNOSIS — F17200 Nicotine dependence, unspecified, uncomplicated: Secondary | ICD-10-CM | POA: Diagnosis present

## 2016-06-20 MED ORDER — NICOTINE 21 MG/24HR TD PT24
21.0000 mg | MEDICATED_PATCH | Freq: Every day | TRANSDERMAL | Status: DC
  Administered 2016-06-23: 21 mg via TRANSDERMAL
  Filled 2016-06-20 (×4): qty 1

## 2016-06-20 MED ORDER — PRAZOSIN HCL 2 MG PO CAPS
4.0000 mg | ORAL_CAPSULE | Freq: Every day | ORAL | Status: DC
  Administered 2016-06-20 – 2016-06-24 (×5): 4 mg via ORAL
  Filled 2016-06-20 (×5): qty 2

## 2016-06-20 MED ORDER — VITAMIN B-1 100 MG PO TABS: 100.0000 mg | ORAL_TABLET | Freq: Every day | ORAL | Status: DC

## 2016-06-20 MED ORDER — LORAZEPAM 2 MG PO TABS: 0.0000 mg | ORAL_TABLET | Freq: Two times a day (BID) | ORAL | Status: DC

## 2016-06-20 MED ORDER — ACETAMINOPHEN 325 MG PO TABS
650.0000 mg | ORAL_TABLET | Freq: Four times a day (QID) | ORAL | Status: DC | PRN
Start: 2016-06-20 — End: 2016-06-25

## 2016-06-20 MED ORDER — ATENOLOL 25 MG PO TABS
50.0000 mg | ORAL_TABLET | Freq: Every day | ORAL | Status: DC
  Administered 2016-06-21 – 2016-06-25 (×5): 50 mg via ORAL
  Filled 2016-06-20 (×5): qty 2

## 2016-06-20 MED ORDER — ONDANSETRON HCL 4 MG PO TABS
4.0000 mg | ORAL_TABLET | Freq: Three times a day (TID) | ORAL | Status: DC | PRN
  Administered 2016-06-21: 4 mg via ORAL
  Filled 2016-06-20: qty 1

## 2016-06-20 MED ORDER — CHLORDIAZEPOXIDE HCL 25 MG PO CAPS
25.0000 mg | ORAL_CAPSULE | Freq: Four times a day (QID) | ORAL | Status: DC
  Administered 2016-06-20 – 2016-06-21 (×3): 25 mg via ORAL
  Filled 2016-06-20 (×3): qty 1

## 2016-06-20 MED ORDER — TRAZODONE HCL 100 MG PO TABS
100.0000 mg | ORAL_TABLET | Freq: Every day | ORAL | Status: DC
  Administered 2016-06-20 – 2016-06-24 (×5): 100 mg via ORAL
  Filled 2016-06-20 (×5): qty 1

## 2016-06-20 MED ORDER — THIAMINE HCL 100 MG/ML IJ SOLN: 100.0000 mg | Freq: Every day | INTRAMUSCULAR | Status: DC

## 2016-06-20 MED ORDER — LISINOPRIL 10 MG PO TABS
5.0000 mg | ORAL_TABLET | Freq: Every day | ORAL | Status: DC
  Administered 2016-06-21 – 2016-06-25 (×5): 5 mg via ORAL
  Filled 2016-06-20 (×5): qty 1

## 2016-06-20 MED ORDER — CARBAMAZEPINE 200 MG PO TABS
200.0000 mg | ORAL_TABLET | Freq: Two times a day (BID) | ORAL | Status: DC
  Administered 2016-06-20 – 2016-06-25 (×11): 200 mg via ORAL
  Filled 2016-06-20 (×11): qty 1

## 2016-06-20 MED ORDER — IBUPROFEN 600 MG PO TABS
600.0000 mg | ORAL_TABLET | Freq: Three times a day (TID) | ORAL | Status: DC | PRN
  Administered 2016-06-21: 600 mg via ORAL
  Filled 2016-06-20: qty 1

## 2016-06-20 MED ORDER — LORAZEPAM 2 MG PO TABS
0.0000 mg | ORAL_TABLET | Freq: Four times a day (QID) | ORAL | Status: DC
  Administered 2016-06-20: 1 mg via ORAL
  Filled 2016-06-20: qty 1

## 2016-06-20 MED ORDER — ALUM & MAG HYDROXIDE-SIMETH 200-200-20 MG/5ML PO SUSP: 30.0000 mL | ORAL | Status: DC | PRN

## 2016-06-20 MED ORDER — MAGNESIUM HYDROXIDE 400 MG/5ML PO SUSP: 30.0000 mL | Freq: Every day | ORAL | Status: DC | PRN

## 2016-06-20 NOTE — BH Assessment (Signed)
[facility] Assessment Progress Note  This pt has been accepted to [facility].  Pt has signed Voluntary Admission and Consent for Treatment, as well as Consent to Release Information, and signed forms have been faxed to [phone].  Pt's nurse, Rudean HittDawnaly, has been notified, and agrees to send original paperwork along with pt via [facility] [place], and to call report to [phone].  Doylene Canninghomas Heaven Meeker, MA Triage Specialist [phone]

## 2016-06-20 NOTE — BH Assessment (Signed)
Writer confirmed with the [facility] that the patient has been accepted to their Unit Bed 322-A.  Dr. Ronnell FreshwaterPutchalski is the accepting doctor.  The nurse can arrange transportation [facility] through [facility].

## 2016-06-20 NOTE — Progress Notes (Signed)
Patient with depressed affect, cooperative behavior with admission interview, assessment and orientation. Patient vs monitored and recorded and CIWA 5 and Ativan 1 mg po administered for s/s of withdrawal as anxiety, slight tremor patient feels, and nausea. Patient also reports feeling cold and tired. Refuses nicotine patch at this time. Patient states he would like to stop drinking alcohol and smoking cigarettes. Patient is aware alcohol is causing problems for him. Reports he has no positive supports, no employment, and no skills needed to cease drinking alcohol. Educated on s/s of alcoholism and coping skills needed to quit. Patient encouraged to rest, eat and drink fluids to physically felling better. Orient to unit. Lunch tray ordered. No SI/HI at this time.

## 2016-06-20 NOTE — ED Notes (Signed)
[facility].  All belongings given to the [place] driver.  Patient left the unit ambulatory.

## 2016-06-20 NOTE — H&P (Signed)
Psychiatric Admission Assessment Adult  Patient Identification: Chase Moss MRN:  [id number] Date of Evaluation:  06/20/2016 Chief Complaint:  Bipolar Disorder Principal Diagnosis: Major depressive disorder, recurrent severe without psychotic features ([place]) Diagnosis:   Patient Active Problem List   Diagnosis Date Noted  . HTN (hypertension) [I10] 06/20/2016  . Major depressive disorder, recurrent severe without psychotic features ([place]) [F33.2] 06/20/2016  . Tobacco use disorder [F17.200] 06/20/2016  . Trauma [T14.90] 07/27/2015  . Cannabis use disorder, moderate, dependence ([place]) [F12.20]   . PTSD (post-traumatic stress disorder) [F43.10]   . Substance induced mood disorder ([place]) [F19.94] 07/07/2014  . Alcohol use disorder, moderate, dependence ([place]) [F10.20] 04/10/2012  . Suicidal thoughts [R45.851] 04/04/2012   History of Present Illness:   Identifying data. Mr. Pavao is a 50 year old male with history of depression, anxiety, mood instability, and substance use.  Chief complaint. "I'm suicidal."  History of present illness. Information was obtained from the patient and the chart. The patient has a long history of mental illness and substance abuse with multiple psychiatric hospitalizations. He discontinued his medications a year and a half ago after his mother passed away. He became increasingly depressed with poor sleep, decreased appetite, anhedonia, guilt and hopelessness worthlessness and poor energy and concentration, social isolation and crying spells. He has had frequent thoughts of suicide. In the past several weeks his depression worsen he became increasingly anxious with returning symptoms of PTSD. He relapsed on cocaine and became suicidal with a plan to jump [place]. He came to the hospital. He denies psychotic symptoms or symptoms suggestive of bipolar mania. He admits to drinking beer daily and recent relapse on cocaine. He has been positive for benzodiazepines on  admission also.  Past psychiatric history. He has been hospitalized multiple times 2012, 2013, 2015 and 2016 for depression and substance abuse. He reports suicide attempts by overdose. He denies ever trying substance abuse treatment. He is unable to name any medications that were used in the past. They felt that they were helpful "sometimes". He was diagnosed with depression, polysubstance dependence, PTSD. His PTSD stems from experience in prison.  Family psychiatric history. Nonreported.  Social history. 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 He's been living at the hotel [place] the airport in [place] for the past 3 months. He has a sister but does not get along with her husband. He has no other support. He is uninsured.  Total Time spent with patient: 1 hour  Is the patient at risk to self? Yes.    Has the patient been a risk to self in the past 6 months? Yes.    Has the patient been a risk to self within the distant past? Yes.    Is the patient a risk to others? No.  Has the patient been a risk to others in the past 6 months? No.  Has the patient been a risk to others within the distant past? No.   Prior Inpatient Therapy:   Prior Outpatient Therapy:    Alcohol Screening:   Substance Abuse History in the last 12 months:  Yes.   Consequences of Substance Abuse: Negative Previous Psychotropic Medications: Yes  Psychological Evaluations: No  Past Medical History:  Past Medical History  Diagnosis Date  . Hypertension   . Depression   . PTSD (post-traumatic stress disorder)   . Bipolar 1 disorder ([place])    History reviewed. No pertinent past surgical history. Family History:  Family History  Problem Relation  Age of Onset  . Heart attack Other    Tobacco Screening: _0 [phone])::1)@ Social History:  History  Alcohol Use  . 1.2 oz/week  . 2 Cans of beer per week    Comment: daily alcohol     History  Drug Use  . Yes  . Special:  Marijuana    Comment: several times a day    Additional Social History:                           Allergies:  No Known Allergies Lab Results:  Results for orders placed or performed during the hospital encounter of 06/18/16 (from the past 48 hour(s))  Comprehensive metabolic panel     Status: Abnormal   Collection Time: 06/18/16  5:39 PM  Result Value Ref Range   Sodium 137 135 - 145 mmol/L   Potassium 4.2 3.5 - 5.1 mmol/L   Chloride 102 101 - 111 mmol/L   CO2 27 22 - 32 mmol/L   Glucose, Bld 103 (H) 65 - 99 mg/dL   BUN 13 6 - 20 mg/dL   Creatinine, Ser 1.03 0.61 - 1.24 mg/dL   Calcium 9.3 8.9 - 10.3 mg/dL   Total Protein 7.5 6.5 - 8.1 g/dL   Albumin 4.4 3.5 - 5.0 g/dL   AST 24 15 - 41 U/L   ALT 23 17 - 63 U/L   Alkaline Phosphatase 59 38 - 126 U/L   Total Bilirubin 0.8 0.3 - 1.2 mg/dL   GFR calc non Af Amer >60 >60 mL/min   GFR calc Af Amer >60 >60 mL/min    Comment: (NOTE) The eGFR has been calculated using the CKD EPI equation. This calculation has not been validated in all clinical situations. eGFR's persistently <60 mL/min signify possible Chronic Kidney Disease.    Anion gap 8 5 - 15  Ethanol     Status: None   Collection Time: 06/18/16  5:39 PM  Result Value Ref Range   Alcohol, Ethyl (B) <5 <5 mg/dL    Comment:        LOWEST DETECTABLE LIMIT FOR SERUM ALCOHOL IS 5 mg/dL FOR MEDICAL PURPOSES ONLY   Salicylate level     Status: None   Collection Time: 06/18/16  5:39 PM  Result Value Ref Range   Salicylate Lvl <3.2 2.8 - 30.0 mg/dL  Acetaminophen level     Status: Abnormal   Collection Time: 06/18/16  5:39 PM  Result Value Ref Range   Acetaminophen (Tylenol), Serum <10 (L) 10 - 30 ug/mL    Comment:        THERAPEUTIC CONCENTRATIONS VARY SIGNIFICANTLY. A RANGE OF 10-30 ug/mL MAY BE AN EFFECTIVE CONCENTRATION FOR MANY PATIENTS. HOWEVER, SOME ARE BEST TREATED AT CONCENTRATIONS OUTSIDE THIS RANGE. ACETAMINOPHEN CONCENTRATIONS >150 ug/mL AT 4  HOURS AFTER INGESTION AND >50 ug/mL AT 12 HOURS AFTER INGESTION ARE OFTEN ASSOCIATED WITH TOXIC REACTIONS.   cbc     Status: None   Collection Time: 06/18/16  5:39 PM  Result Value Ref Range   WBC 10.1 4.0 - 10.5 K/uL   RBC 5.19 4.22 - 5.81 MIL/uL   Hemoglobin 16.3 13.0 - 17.0 g/dL   HCT 46.5 39.0 - 52.0 %   MCV 89.6 78.0 - 100.0 fL   MCH 31.4 26.0 - 34.0 pg   MCHC 35.1 30.0 - 36.0 g/dL   RDW 12.9 11.5 - 15.5 %   Platelets 244 150 - 400 K/uL  Rapid urine  drug screen (hospital performed)     Status: Abnormal   Collection Time: 06/19/16 12:56 AM  Result Value Ref Range   Opiates NONE DETECTED NONE DETECTED   Cocaine POSITIVE (A) NONE DETECTED   Benzodiazepines POSITIVE (A) NONE DETECTED   Amphetamines NONE DETECTED NONE DETECTED   Tetrahydrocannabinol POSITIVE (A) NONE DETECTED   Barbiturates NONE DETECTED NONE DETECTED    Comment:        DRUG SCREEN FOR MEDICAL PURPOSES ONLY.  IF CONFIRMATION IS NEEDED FOR ANY PURPOSE, NOTIFY LAB WITHIN 5 DAYS.        LOWEST DETECTABLE LIMITS FOR URINE DRUG SCREEN Drug Class       Cutoff (ng/mL) Amphetamine      1000 Barbiturate      200 Benzodiazepine   417 Tricyclics       408 Opiates          300 Cocaine          300 THC              50     Blood Alcohol level:  Lab Results  Component Value Date   ETH <5 06/18/2016   ETH <5 14/48/1856    Metabolic Disorder Labs:  Lab Results  Component Value Date   HGBA1C 5.1 04/09/2012   MPG 100 04/09/2012   No results found for: PROLACTIN No results found for: CHOL, TRIG, HDL, CHOLHDL, VLDL, LDLCALC  Current Medications: Current Facility-Administered Medications  Medication Dose Route Frequency Provider Last Rate Last Dose  . acetaminophen (TYLENOL) tablet 650 mg  650 mg Oral Q6H PRN Benjamine Mola, FNP      . alum & mag hydroxide-simeth (MAALOX/MYLANTA) 200-200-20 MG/5ML suspension 30 mL  30 mL Oral Q4H PRN Benjamine Mola, FNP      . [START ON 06/21/2016] atenolol (TENORMIN)  tablet 50 mg  50 mg Oral Daily John C Withrow, FNP      . carbamazepine (TEGRETOL) tablet 200 mg  200 mg Oral BID PC Benjamine Mola, FNP      . chlordiazePOXIDE (LIBRIUM) capsule 25 mg  25 mg Oral QID Oneika Simonian B Moxie Kalil, MD      . ibuprofen (ADVIL,MOTRIN) tablet 600 mg  600 mg Oral Q8H PRN Benjamine Mola, FNP      . [START ON 06/21/2016] lisinopril (PRINIVIL,ZESTRIL) tablet 5 mg  5 mg Oral Daily John C Withrow, FNP      . magnesium hydroxide (MILK OF MAGNESIA) suspension 30 mL  30 mL Oral Daily PRN Benjamine Mola, FNP      . [START ON 06/21/2016] nicotine (NICODERM CQ - dosed in mg/24 hours) patch 21 mg  21 mg Transdermal Daily John C Withrow, FNP      . ondansetron (ZOFRAN) tablet 4 mg  4 mg Oral Q8H PRN Benjamine Mola, FNP      . traZODone (DESYREL) tablet 100 mg  100 mg Oral QHS Markesia Crilly B Mitcheal Sweetin, MD       PTA Medications: Prescriptions prior to admission  Medication Sig Dispense Refill Last Dose  . amoxicillin-clavulanate (AUGMENTIN) 875-125 MG tablet Take 1 tablet by mouth every 12 (twelve) hours. (Patient not taking: Reported on 06/18/2016) 14 tablet 0 Completed Course at Unknown time  . atenolol (TENORMIN) 50 MG tablet TAKE 50 MG BY MOUTH DAILY   06/17/2016 at 1430  . lisinopril (PRINIVIL,ZESTRIL) 5 MG tablet Take 5 mg by mouth daily.   06/17/2016 at Unknown time    Musculoskeletal: Strength & Muscle  Tone: within normal limits Gait & Station: normal Patient leans: N/A  Psychiatric Specialty Exam: Physical Exam  Nursing note and vitals reviewed. Constitutional: He is oriented to person, place, and time. He appears well-developed and well-nourished.  HENT:  Head: Normocephalic and atraumatic.  Eyes: Conjunctivae and EOM are normal. Pupils are equal, round, and reactive to light.  Neck: Normal range of motion. Neck supple.  Cardiovascular: Normal rate, regular rhythm and normal heart sounds.   Respiratory: Effort normal and breath sounds normal.  GI: Soft.  Musculoskeletal:  Normal range of motion.  Neurological: He is alert and oriented to person, place, and time.  Skin: Skin is warm and dry.    Review of Systems  Psychiatric/Behavioral: Positive for depression, suicidal ideas and substance abuse. The patient is nervous/anxious and has insomnia.   All other systems reviewed and are negative.   Blood pressure 145/102, pulse 90, temperature 99.2 F (37.3 C), temperature source Oral, resp. rate 18, SpO2 100 %.There is no weight on file to calculate BMI.  See SRA.                                                         Treatment Plan Summary: Daily contact with patient to assess and evaluate symptoms and progress in treatment and Medication management   Mr. Froh is a 50 year old male with a history of depression, anxiety, and substance abuse admitted for suicidal ideation with a plan to jump off the overpass in the context of treatment noncompliance and severe social stressors.  1. Suicidal ideation. The patient is able to contract for safety in the hospital.  2. Mood. He was started on Trileptal for mood stabilization.  3. Alcohol use. He is on Librium taper.  4. Substance abuse treatment. The patient is somewhat interested in substance abuse treatment.  5. Hypertension. He is on atenolol and lisinopril.   6. Smoking. Nicotine patch is available.  7. Metabolic syndrome screening. We'll check lipid level, TSH, hemoglobin A1c and prolactin.  8. Insomnia. Trazodone is available.   9. Disposition. To be established.    Observation Level/Precautions:  15 minute checks  Laboratory:  CBC Chemistry Profile UDS UA  Psychotherapy:    Medications:    Consultations:    Discharge Concerns:    Estimated LOS:  Other:     I certify that inpatient services furnished can reasonably be expected to improve the patient's condition.    Orson Slick, MD 6/28/20173:11 PM

## 2016-06-20 NOTE — [facility] Group Notes (Signed)
[facility] Group Notes:  (Nursing/MHT/Case Management/Adjunct)  Date:  06/20/2016  Time:  4:22 PM  Type of Therapy:  Psychoeducational Skills  Participation Level:  Active  Participation Quality:  Appropriate, Attentive and Sharing  Affect:  Appropriate  Cognitive:  Alert  Insight:  Appropriate  Engagement in Group:  Engaged  Modes of Intervention:  Discussion, Education and Support  Summary of Progress/Problems:  Chase Moss [facility] 06/20/2016, 4:22 PM

## 2016-06-20 NOTE — Tx Team (Signed)
Initial Interdisciplinary Treatment Plan   PATIENT STRESSORS: Financial difficulties Substance abuse   PATIENT STRENGTHS: Barrister's clerkCommunication skills Motivation for treatment/growth   PROBLEM LIST: Problem List/Patient Goals Date to be addressed Date deferred Reason deferred Estimated date of resolution  Substance abuse  6/28           Suicidal ideas prior to admit  6/28                                          DISCHARGE CRITERIA:  Adequate post-discharge living arrangements Improved stabilization in mood, thinking, and/or behavior  PRELIMINARY DISCHARGE PLAN: Attend aftercare/continuing care group Attend 12-step recovery group  PATIENT/FAMIILY INVOLVEMENT: This treatment plan has been presented to and reviewed with the patient, Chase Moss, and/or family member, .  The patient and family have been given the opportunity to ask questions and make suggestions.  Ignacia FellingJennifer A Joeli Fenner 06/20/2016, 4:38 PM

## 2016-06-20 NOTE — [facility] Counselor (Signed)
[facility] Charge Nurse Dedra SkeensGwen and LeadoreBukola.  [facility]  Accepting Dr. Ardyth HarpsHernandez. Attending Dr. Jennet MaduroPucilowska.  Registration Byrd Hesselbach(Maria ex [phone]) has been notified.  Pt has been pre-admitted.  Left message with [facility] Intake office regarding patient status.

## 2016-06-20 NOTE — ED Notes (Signed)
[facility].  Per Charlynne Panderara, all nurses are busy giving meds and she took a message to have them call me back.

## 2016-06-20 NOTE — [facility] Suicide Risk Assessment (Signed)
[facility]   Nursing information obtained from:    Demographic factors:    Current Mental Status:    Loss Factors:    Historical Factors:    Risk Reduction Factors:     Total Time spent with patient: 1 hour Principal Problem: Major depressive disorder, recurrent severe without psychotic features ([facility]) Diagnosis:   Patient Active Problem List   Diagnosis Date Noted  . HTN (hypertension) [I10] 06/20/2016  . Major depressive disorder, recurrent severe without psychotic features ([facility]) [F33.2] 06/20/2016  . Tobacco use disorder [F17.200] 06/20/2016  . Trauma [T14.90] 07/27/2015  . Cannabis use disorder, moderate, dependence ([facility]) [F12.20]   . PTSD (post-traumatic stress disorder) [F43.10]   . Substance induced mood disorder ([facility]) [F19.94] 07/07/2014  . Alcohol use disorder, moderate, dependence ([facility]) [F10.20] 04/10/2012  . Suicidal thoughts [R45.851] 04/04/2012   Subjective Data: Depression, suicidal ideation with a plan, substance use.  Continued Clinical Symptoms:    The "Alcohol Use Disorders Identification Test", Guidelines for Use in Primary Care, Second Edition.  World Science writerHealth Organization [facility](WHO). Score between 0-7:  no or low risk or alcohol related problems. Score between 8-15:  moderate risk of alcohol related problems. Score between 16-19:  high risk of alcohol related problems. Score 20 or above:  warrants further diagnostic evaluation for alcohol dependence and treatment.   CLINICAL FACTORS:   Depression:   Comorbid alcohol abuse/dependence Impulsivity Alcohol/Substance Abuse/Dependencies   Musculoskeletal: Strength & Muscle Tone: within normal limits Gait & Station: normal Patient leans: N/A  Psychiatric Specialty Exam: Physical Exam  Nursing note and vitals reviewed.   Review of Systems  Psychiatric/Behavioral: Positive for depression, suicidal ideas and substance abuse. The patient is nervous/anxious and has insomnia.   All other  systems reviewed and are negative.   Blood pressure 145/102, pulse 90, temperature 99.2 F (37.3 C), temperature source Oral, resp. rate 18, SpO2 100 %.There is no weight on file to calculate BMI.  General Appearance: Fairly Groomed  Eye Contact:  Minimal  Speech:  Slow  Volume:  Decreased  Mood:  Depressed, Hopeless and Worthless  Affect:  Blunt  Thought Process:  Goal Directed  Orientation:  Full (Time, Place, and Person)  Thought Content:  WDL  Suicidal Thoughts:  Yes.  with intent/plan  Homicidal Thoughts:  No  Memory:  Immediate;   Fair Recent;   Fair Remote;   Fair  Judgement:  Poor  Insight:  Lacking  Psychomotor Activity:  Psychomotor Retardation  Concentration:  Concentration: Poor and Attention Span: Poor  Recall:  [name]  Fund of Knowledge:  Fair  Language:  Fair  Akathisia:  No  Handed:  Right  AIMS (if indicated):     Assets:  Communication Skills Desire for Improvement Physical Health Resilience  ADL's:  Intact  Cognition:  WNL  Sleep:         COGNITIVE FEATURES THAT CONTRIBUTE TO RISK:  None    SUICIDE RISK:   Moderate:  Frequent suicidal ideation with limited intensity, and duration, some specificity in terms of plans, no associated intent, good self-control, limited dysphoria/symptomatology, some risk factors present, and identifiable protective factors, including available and accessible social support.  PLAN OF CARE: Hospital admission, medication management, substance abuse counseling discharge planning.  [name] is a 50 year old male with a history of depression, anxiety, and substance abuse admitted for suicidal ideation with a plan to jump off the overpass in the context of treatment noncompliance and severe social stressors.  1. Suicidal ideation. The  patient is able to contract for safety in the hospital.  2. Mood. He was started on Trileptal for mood stabilization.  3. Alcohol use. He is on Librium taper.  4. Substance abuse treatment.  The patient is somewhat interested in substance abuse treatment.  5. Hypertension. He is on atenolol and lisinopril.   6. Smoking. Nicotine patch is available.  7. Metabolic syndrome screening. We'll check lipid level, TSH, hemoglobin A1c and prolactin.  8. Disposition. To be established.    I certify that inpatient services furnished can reasonably be expected to improve the patient's condition.   Kristine LineaJolanta Yocelyn Brocious, MD 06/20/2016, 3:05 PM

## 2016-06-21 ENCOUNTER — Inpatient Hospital Stay: Payer: Medicaid Other

## 2016-06-21 LAB — LIPID PANEL
CHOL/HDL RATIO: 3.2 ratio
Cholesterol: 193 mg/dL (ref 0–200)
HDL: 60 mg/dL (ref >40)
LDL CALC: 88 mg/dL (ref 0–99)
Triglycerides: 226 mg/dL — ABNORMAL HIGH (ref <150)
VLDL: 45 mg/dL — ABNORMAL HIGH (ref 0–40)

## 2016-06-21 LAB — TSH: TSH: 2.171 u[IU]/mL (ref 0.350–4.500)

## 2016-06-21 LAB — HEMOGLOBIN A1C: Hgb A1c MFr Bld: 5.2 % (ref 4.0–6.0)

## 2016-06-21 MED ORDER — PANTOPRAZOLE SODIUM 40 MG PO TBEC
40.0000 mg | DELAYED_RELEASE_TABLET | Freq: Every day | ORAL | Status: DC
  Administered 2016-06-21 – 2016-06-25 (×5): 40 mg via ORAL
  Filled 2016-06-21 (×5): qty 1

## 2016-06-21 MED ORDER — LORAZEPAM 2 MG PO TABS
2.0000 mg | ORAL_TABLET | Freq: Four times a day (QID) | ORAL | Status: DC | PRN
  Administered 2016-06-21 – 2016-06-22 (×2): 2 mg via ORAL
  Filled 2016-06-21 (×2): qty 1

## 2016-06-21 MED ORDER — LORAZEPAM 2 MG/ML IJ SOLN
INTRAMUSCULAR | Status: AC
  Administered 2016-06-21: 2 mg
  Filled 2016-06-21: qty 1

## 2016-06-21 MED ORDER — CHLORDIAZEPOXIDE HCL 25 MG PO CAPS
50.0000 mg | ORAL_CAPSULE | Freq: Four times a day (QID) | ORAL | Status: DC
  Administered 2016-06-21 – 2016-06-22 (×5): 50 mg via ORAL
  Filled 2016-06-21 (×5): qty 2

## 2016-06-21 MED ORDER — ONDANSETRON 4 MG PO TBDP
8.0000 mg | ORAL_TABLET | Freq: Three times a day (TID) | ORAL | Status: DC | PRN
  Filled 2016-06-21: qty 2

## 2016-06-21 MED ORDER — LORAZEPAM 2 MG/ML IJ SOLN: 2.0000 mg | Freq: Four times a day (QID) | INTRAMUSCULAR | Status: DC | PRN

## 2016-06-21 NOTE — [facility] Group Notes (Signed)
[facility] Group Notes:  (Nursing/MHT/Case Management/Adjunct)  Date:  06/21/2016  Time:  6:09 AM  Type of Therapy:  Group Therapy  Participation Level:  Active  Participation Quality:  Appropriate  Affect:  Appropriate  Cognitive:  Appropriate  Insight:  Good  Engagement in Group:  Engaged  Modes of Intervention:  n/a  Summary of Progress/Problems:  Chase Moss Chase Moss 06/21/2016, 6:09 AM 
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
## 2016-06-21 NOTE — [facility] Group Notes (Signed)
[facility] LCSW Group Therapy   06/21/2016 9:00 am  Type of Therapy: Group Therapy   Participation Level: Did Not Attend. Patient invited to participate but declined.    Terea Neubauer F. Dillyn Menna, MSW, LCSWA, LCAS  06/21/16   

## 2016-06-21 NOTE — Progress Notes (Signed)
[facility] MD Progress Note  06/21/2016 2:38 PM Chase Moss  MRN:  [id number]  Subjective:  Mr. Caz complains of symptoms of alcohol withdrawals. He now reports drinking 4-5 40 oz beers every day. He has been vomitting, sweaty, shaky. He spend today in bed.  Principal Problem: Major depressive disorder, recurrent severe without psychotic features (HCC) Diagnosis:   Patient Active Problem List   Diagnosis Date Noted  . HTN (hypertension) [I10] 06/20/2016  . Major depressive disorder, recurrent severe without psychotic features (HCC) [F33.2] 06/20/2016  . Tobacco use disorder [F17.200] 06/20/2016  . Trauma [T14.90] 07/27/2015  . Cannabis use disorder, moderate, dependence (HCC) [F12.20]   . PTSD (post-traumatic stress disorder) [F43.10]   . Substance induced mood disorder (HCC) [F19.94] 07/07/2014  . Alcohol use disorder, moderate, dependence (HCC) [F10.20] 04/10/2012  . Suicidal thoughts [R45.851] 04/04/2012   Total Time spent with patient: 20 minutes  Past Psychiatric History: Depression, anxiety, suicidal ideation, substance abuse.  Past Medical History:  Past Medical History  Diagnosis Date  . Hypertension   . Depression   . PTSD (post-traumatic stress disorder)   . Bipolar 1 disorder (HCC)    History reviewed. No pertinent past surgical history. Family History:  Family History  Problem Relation Age of Onset  . Heart attack Other    Family Psychiatric  History: See H&P. Social History:  History  Alcohol Use  . 1.2 oz/week  . 2 Cans of beer per week    Comment: daily alcohol     History  Drug Use  . Yes  . Special: Marijuana    Comment: several times a day    Social History   Social History  . Marital Status: Divorced    Spouse Name: N/A  . Number of Children: N/A  . Years of Education: N/A   Social History Main Topics  . Smoking status: Current Every Day Smoker -- 1.00 packs/day for 30 years    Types: Cigarettes  . Smokeless tobacco: Never Used  .  Alcohol Use: 1.2 oz/week    2 Cans of beer per week     Comment: daily alcohol  . Drug Use: Yes    Special: Marijuana     Comment: several times a day  . Sexual Activity: Not Asked   Other Topics Concern  . None   Social History Narrative   Additional Social History:                         Sleep: Fair  Appetite:  Poor  Current Medications: Current Facility-Administered Medications  Medication Dose Route Frequency Provider Last Rate Last Dose  . acetaminophen (TYLENOL) tablet 650 mg  650 mg Oral Q6H PRN Beau Fanny, FNP      . alum & mag hydroxide-simeth (MAALOX/MYLANTA) 200-200-20 MG/5ML suspension 30 mL  30 mL Oral Q4H PRN Beau Fanny, FNP      . atenolol (TENORMIN) tablet 50 mg  50 mg Oral Daily Beau Fanny, FNP   50 mg at 06/21/16 0865  . carbamazepine (TEGRETOL) tablet 200 mg  200 mg Oral BID PC Beau Fanny, FNP   200 mg at 06/21/16 7846  . chlordiazePOXIDE (LIBRIUM) capsule 50 mg  50 mg Oral QID Castiel Lauricella B Ewald Beg, MD      . ibuprofen (ADVIL,MOTRIN) tablet 600 mg  600 mg Oral Q8H PRN Beau Fanny, FNP   600 mg at 06/21/16 0831  . lisinopril (PRINIVIL,ZESTRIL) tablet 5 mg  5  mg Oral Daily Beau FannyJohn C Withrow, FNP   5 mg at 06/21/16 16100832  . LORazepam (ATIVAN) tablet 2 mg  2 mg Oral Q6H PRN Shari ProwsJolanta B Lara Palinkas, MD       Or  . LORazepam (ATIVAN) injection 2 mg  2 mg Intramuscular Q6H PRN Divine Imber B Ambers Iyengar, MD      . magnesium hydroxide (MILK OF MAGNESIA) suspension 30 mL  30 mL Oral Daily PRN Beau FannyJohn C Withrow, FNP      . nicotine (NICODERM CQ - dosed in mg/24 hours) patch 21 mg  21 mg Transdermal Daily Beau FannyJohn C Withrow, FNP   21 mg at 06/21/16 1000  . ondansetron (ZOFRAN-ODT) disintegrating tablet 8 mg  8 mg Oral Q8H PRN Heleena Miceli B Keno Caraway, MD      . prazosin (MINIPRESS) capsule 4 mg  4 mg Oral QHS Audery AmelJohn T Clapacs, MD   4 mg at 06/20/16 2202  . traZODone (DESYREL) tablet 100 mg  100 mg Oral QHS Shari ProwsJolanta B Amoria Mclees, MD   100 mg at 06/20/16 2145    Lab  Results:  Results for orders placed or performed during the hospital encounter of 06/20/16 (from the past 48 hour(s))  Lipid panel     Status: Abnormal   Collection Time: 06/21/16  7:13 AM  Result Value Ref Range   Cholesterol 193 0 - 200 mg/dL   Triglycerides 960226 (H) <150 mg/dL   HDL 60 >45>40 mg/dL   Total CHOL/HDL Ratio 3.2 RATIO   VLDL 45 (H) 0 - 40 mg/dL   LDL Cholesterol 88 0 - 99 mg/dL    Comment:        Total Cholesterol/HDL:CHD Risk Coronary Heart Disease Risk Table                     Men   Women  1/2 Average Risk   3.4   3.3  Average Risk       5.0   4.4  2 X Average Risk   9.6   7.1  3 X Average Risk  23.4   11.0        Use the calculated Patient Ratio above and the CHD Risk Table to determine the patient's CHD Risk.        ATP III CLASSIFICATION (LDL):  <100     mg/dL   Optimal  [id number]  mg/dL   Near or Above                    Optimal  130-159  mg/dL   Borderline  [id number]  mg/dL   High  >956>190     mg/dL   Very High   TSH     Status: None   Collection Time: 06/21/16  7:13 AM  Result Value Ref Range   TSH 2.171 0.350 - 4.500 uIU/mL    Blood Alcohol level:  Lab Results  Component Value Date   ETH <5 06/18/2016   ETH <5 06/17/2015    Metabolic Disorder Labs: Lab Results  Component Value Date   HGBA1C 5.1 04/09/2012   MPG 100 04/09/2012   No results found for: PROLACTIN Lab Results  Component Value Date   CHOL 193 06/21/2016   TRIG 226* 06/21/2016   HDL 60 06/21/2016   CHOLHDL 3.2 06/21/2016   VLDL 45* 06/21/2016   LDLCALC 88 06/21/2016    Physical Findings: AIMS:  , ,  ,  ,    CIWA:  CIWA-Ar Total: 8 COWS:  Musculoskeletal: Strength & Muscle Tone: within normal limits Gait & Station: unsteady Patient leans: N/A  Psychiatric Specialty Exam: Physical Exam  Nursing note and vitals reviewed.   Review of Systems  Psychiatric/Behavioral: Positive for depression, suicidal ideas and substance abuse. The patient is nervous/anxious.   All  other systems reviewed and are negative.   Blood pressure 147/97, pulse 65, temperature 97.8 F (36.6 C), temperature source Oral, resp. rate 18, height 5\' 9"  (1.753 m), weight 76.204 kg (168 lb), SpO2 100 %.Body mass index is 24.8 kg/(m^2).  General Appearance: Disheveled  Eye Contact:  Poor  Speech:  Clear and Coherent  Volume:  Normal  Mood:  Anxious and Depressed  Affect:  Blunt  Thought Process:  Goal Directed  Orientation:  Full (Time, Place, and Person)  Thought Content:  WDL  Suicidal Thoughts:  Yes.  with intent/plan  Homicidal Thoughts:  No  Memory:  Immediate;   Fair Recent;   Fair Remote;   Fair  Judgement:  Poor  Insight:  Lacking  Psychomotor Activity:  Psychomotor Retardation  Concentration:  Concentration: Fair and Attention Span: Fair  Recall:  [name]  Fund of Knowledge:  Fair  Language:  Fair  Akathisia:  No  Handed:  Right  AIMS (if indicated):     Assets:  Communication Skills Desire for Improvement Resilience  ADL's:  Intact  Cognition:  WNL  Sleep:  Number of Hours: 6.5     Treatment Plan Summary: Daily contact with patient to assess and evaluate symptoms and progress in treatment and Medication management   [name] is a 50 year old male with a history of depression, anxiety, and substance abuse admitted for suicidal ideation with a plan to jump off the overpass in the context of treatment noncompliance and severe social stressors.  1. Suicidal ideation. The patient is able to contract for safety in the hospital.  2. Mood. He was started on Trileptal for mood stabilization.  3. Alcohol use. He is on Librium taper. We increased dose to 50 mg qid and offered Ativan as needed. We started Zofran and Protonix.  4. Substance abuse treatment. The patient is somewhat interested in substance abuse treatment.  5. Hypertension. He is on atenolol and lisinopril.   6. Smoking. Nicotine patch is available.  7. Metabolic syndrome screening. We'll check  lipid level, TSH, hemoglobin A1c and prolactin.  8. Insomnia. Trazodone is available.   9. PTSD. He was started on Minipress for nightmares.  10. Disposition. To be established.  Kristine LineaJolanta Brittini Brubeck, MD 06/21/2016, 2:38 PM

## 2016-06-21 NOTE — Progress Notes (Signed)
[facility] Patient was immediately deescalted. He was found with a bump on the forehead and some slight bleeding. Patient was very hopeless and depressed. Patient stated he started hitting his head in the bedroom, then the mirror in the bathroom, and then a corner in the bathroom. Patient was immediately moved to a room closer to the nurses station. Vital Signs were assessed. Pupils were equil and reactive. MD was notified. CT was ordered. Patient safety maintained with 15 min checks.

## 2016-06-21 NOTE — Plan of Care (Signed)
Problem: Education: Goal: Understanding of discharge needs will improve Outcome: Progressing Discussed talking to social worker about discharge resources including substance abuse treatment.

## 2016-06-21 NOTE — [facility] Group Notes (Deleted)
[facility] LCSW Group Therapy   06/21/2016 9:15 am   Type of Therapy: Group Therapy   Participation Level: Active   Participation Quality: Attentive, Sharing and Supportive   Affect: Appropriate   Cognitive: Alert and Oriented   Insight: Developing/Improving and Engaged   Engagement in Therapy: Developing/Improving and Engaged   Modes of Intervention: Clarification, Confrontation, Discussion, Education, Exploration, Limit-setting, Orientation, Problem-solving, Rapport Building, Dance movement psychotherapisteality Testing, Socialization and Support   Summary of Progress/Problems: The topic for group was balance in life. Today's group focused on defining balance in one's own words, identifying things that can knock one off balance, and exploring healthy ways to maintain balance in life. Group members were asked to provide an example of a time when they felt off balance, describe how they handled that situation, and process healthier ways to regain balance in the future. Group members were asked to share the most important tool for maintaining [facility] and how they plan to apply this method after discharge. Pt reported that the pt considered a smart goal that the pt can utilize to remain balanced means for the pt to feel better.  Pt did not share at length and only entered group approx 35 minutes into the group.  For the remainder of the group the pt was polite and cooperative with the CSW and other group members and focused and attentive to the topics discussed and the sharing of others.   Dorothe PeaJonathan F. Alyx Mcguirk, LCSWA, LCAS  06/21/16

## 2016-06-21 NOTE — Progress Notes (Signed)
First am patient was nauseated and vomiting.  Tremulous and gait unsteady.  States "I can't stop vomiting, I feel so bad and my head hurts"  Instructed patient to stay in bed and not to get out of bed without calling for help.  Patient medicated.  Patient slept until lunch.  Verbalized that he was starting feel better when got up for lunch.  Able to eat lunch and keep it down.  Continues to have slight tremors. N/V subsided.  Outside with peers at 4:00 pm.  Trenton GammonVerbalizes that he wants to continue treatment once he is discharged.  Support and encouragement offered.  Safety maintained.

## 2016-06-21 NOTE — Progress Notes (Signed)
Recreation Therapy Notes  At approximately 11:00 am, LRT attempted assessment. Patient requested for LRT to come back tomorrow as patient had a bad night and had been throwing up.  Jacquelynn CreeGreene,Gwenn Teodoro M, LRT/CTRS 06/21/2016 11:38 AM

## 2016-06-21 NOTE — Progress Notes (Signed)
Recreation Therapy Notes  Date: 06.29.17 Time: 1:00 pm Location: Craft Room  Group Topic: Leisure Education  Goal Area(s) Addresses:  Patient will identify activities for each letter of the alphabet. Patient will verbalize ability to integrate positive leisure into life post d/c. Patient will verbalize ability to use leisure as a Associate Professorcoping skill.  Behavioral Response: Did not attend  Intervention: Leisure Alphabet  Activity: Patients were given a Leisure Information systems managerAlphabet worksheet and instructed to identify a leisure activity for each letter of the alphabet.  Education: LRT educated patient on what they need to participate in leisure.  Education Outcome: Patient did not attend group.  Clinical Observations/Feedback: Patient did not attend group.  Jacquelynn CreeGreene,Malyk Girouard M, LRT/CTRS 06/21/2016 2:47 PM

## 2016-06-22 LAB — PROLACTIN: Prolactin: 21 ng/mL — ABNORMAL HIGH (ref 4.0–15.2)

## 2016-06-22 MED ORDER — HALOPERIDOL 0.5 MG PO TABS
2.0000 mg | ORAL_TABLET | Freq: Four times a day (QID) | ORAL | Status: DC | PRN
  Administered 2016-06-22 – 2016-06-23 (×3): 2 mg via ORAL
  Filled 2016-06-22 (×3): qty 4

## 2016-06-22 MED ORDER — LORAZEPAM 1 MG PO TABS
1.0000 mg | ORAL_TABLET | Freq: Once | ORAL | Status: AC
  Administered 2016-06-22: 1 mg via ORAL
  Filled 2016-06-22: qty 1

## 2016-06-22 MED ORDER — RISPERIDONE 1 MG PO TABS
2.0000 mg | ORAL_TABLET | Freq: Two times a day (BID) | ORAL | Status: DC
  Administered 2016-06-22: 2 mg via ORAL
  Filled 2016-06-22: qty 2

## 2016-06-22 MED ORDER — CHLORDIAZEPOXIDE HCL 25 MG PO CAPS
25.0000 mg | ORAL_CAPSULE | Freq: Three times a day (TID) | ORAL | Status: DC
Start: 2016-06-22 — End: 2016-06-23
  Administered 2016-06-22 – 2016-06-23 (×2): 25 mg via ORAL
  Filled 2016-06-22 (×2): qty 1

## 2016-06-22 NOTE — Progress Notes (Signed)
Patient noted to be depressed, anxious and teary. He stated that he is tired of life and even went further to state that he would "do something" when he leaves this place. Patient stated that when he banged his head last night not because he was trying to hurt self but because he was just angry and agitated. Patient denied wanting to harm self and agreed to talk to staff. Patient also stated that he was experiencing, tremors, sweats and anxiety due to withdrawal from alcohol. He did not attend groups and rested quietly in bed. Patient encouraged to verbalize feelings to nursing staff, he verbalized understanding.

## 2016-06-22 NOTE — Progress Notes (Addendum)
[facility]  06/22/2016 1:02 PM Chase Moss  MRN:  [id number]  Subjective:    [name] is a 50 year old male with history of depression, severe PTSD, and alcoholism admitted for suicidal ideation with a plan to jump off the overpass in the context of treatment noncompliance and relapse on alcohol. He's been drinking 4-5 40 ounce beers daily. He was restarted on medications. He was started on Librium taper but had severe symptoms of withdrawal last vomiting and Librium was increased to 50 mg 3 times daily. Today he seems that they and I will decrease Librium to 25 mg and lower the dose of Ativan available.  Today the patient seems physically better but rather confused. Possibly from benzodiazepines. He was banging his head on the wall and the mirror at night, denies participation in treatment team meeting, accused his doctor and his nurses also not seeing him since admission. He believes that he has not been given any medications he also stated to his nurse that he will kill himself if discharged. During my interview he told me the opposite that he will kill himself if In the hospital and demands immediate discharge. He tells me that his sister broke her hip and he needs to take care of her. During the initial interview he told me that he may not stay with her sister as he does not get along with the husband. I will let him sign 72 hour form. He will talk to the social worker this afternoon about his treatment options   Principal Problem: Major depressive disorder, recurrent severe without psychotic features (HCC) Diagnosis:   Patient Active Problem List   Diagnosis Date Noted  . HTN (hypertension) [I10] 06/20/2016  . Major depressive disorder, recurrent severe without psychotic features (HCC) [F33.2] 06/20/2016  . Tobacco use disorder [F17.200] 06/20/2016  . Trauma [T14.90] 07/27/2015  . Cannabis use disorder, moderate, dependence (HCC) [F12.20]   . PTSD (post-traumatic stress  disorder) [F43.10]   . Substance induced mood disorder (HCC) [F19.94] 07/07/2014  . Alcohol use disorder, moderate, dependence (HCC) [F10.20] 04/10/2012  . Suicidal thoughts [R45.851] 04/04/2012   Total Time spent with patient: 20 minutes  Past Psychiatric History:  depression, PTSD, alcoholism.  Past Medical History:  Past Medical History  Diagnosis Date  . Hypertension   . Depression   . PTSD (post-traumatic stress disorder)   . Bipolar 1 disorder (HCC)    History reviewed. No pertinent past surgical history. Family History:  Family History  Problem Relation Age of Onset  . Heart attack Other    Family Psychiatric  History: See H&P. Social History:  History  Alcohol Use  . 1.2 oz/week  . 2 Cans of beer per week    Comment: daily alcohol     History  Drug Use  . Yes  . Special: Marijuana    Comment: several times a day    Social History   Social History  . Marital Status: Divorced    Spouse Name: N/A  . Number of Children: N/A  . Years of Education: N/A   Social History Main Topics  . Smoking status: Current Every Day Smoker -- 1.00 packs/day for 30 years    Types: Cigarettes  . Smokeless tobacco: Never Used  . Alcohol Use: 1.2 oz/week    2 Cans of beer per week     Comment: daily alcohol  . Drug Use: Yes    Special: Marijuana     Comment: several times a day  .  Sexual Activity: Not Asked   Other Topics Concern  . None   Social History Narrative   Additional Social History:                         Sleep: Poor  Appetite:  Poor  Current Medications: Current Facility-Administered Medications  Medication Dose Route Frequency Provider Last Rate Last Dose  . acetaminophen (TYLENOL) tablet 650 mg  650 mg Oral Q6H PRN Beau Fanny, FNP      . alum & mag hydroxide-simeth (MAALOX/MYLANTA) 200-200-20 MG/5ML suspension 30 mL  30 mL Oral Q4H PRN Beau Fanny, FNP      . atenolol (TENORMIN) tablet 50 mg  50 mg Oral Daily Beau Fanny, FNP    50 mg at 06/22/16 0834  . carbamazepine (TEGRETOL) tablet 200 mg  200 mg Oral BID PC Beau Fanny, FNP   200 mg at 06/22/16 4401  . chlordiazePOXIDE (LIBRIUM) capsule 50 mg  50 mg Oral QID Shari Prows, MD   50 mg at 06/22/16 0834  . ibuprofen (ADVIL,MOTRIN) tablet 600 mg  600 mg Oral Q8H PRN Beau Fanny, FNP   600 mg at 06/21/16 0831  . lisinopril (PRINIVIL,ZESTRIL) tablet 5 mg  5 mg Oral Daily Beau Fanny, FNP   5 mg at 06/22/16 0272  . magnesium hydroxide (MILK OF MAGNESIA) suspension 30 mL  30 mL Oral Daily PRN Beau Fanny, FNP      . nicotine (NICODERM CQ - dosed in mg/24 hours) patch 21 mg  21 mg Transdermal Daily Beau Fanny, FNP   21 mg at 06/21/16 1000  . ondansetron (ZOFRAN-ODT) disintegrating tablet 8 mg  8 mg Oral Q8H PRN Jolanta B Pucilowska, MD      . pantoprazole (PROTONIX) EC tablet 40 mg  40 mg Oral Daily Jolanta B Pucilowska, MD   40 mg at 06/22/16 0833  . prazosin (MINIPRESS) capsule 4 mg  4 mg Oral QHS Audery Amel, MD   4 mg at 06/21/16 2228  . risperiDONE (RISPERDAL) tablet 2 mg  2 mg Oral BID Shari Prows, MD   2 mg at 06/22/16 1032  . traZODone (DESYREL) tablet 100 mg  100 mg Oral QHS Shari Prows, MD   100 mg at 06/21/16 2228    Lab Results:  Results for orders placed or performed during the hospital encounter of 06/20/16 (from the past 48 hour(s))  Lipid panel     Status: Abnormal   Collection Time: 06/21/16  7:13 AM  Result Value Ref Range   Cholesterol 193 0 - 200 mg/dL   Triglycerides 536 (H) <150 mg/dL   HDL 60 >64 mg/dL   Total CHOL/HDL Ratio 3.2 RATIO   VLDL 45 (H) 0 - 40 mg/dL   LDL Cholesterol 88 0 - 99 mg/dL    Comment:        Total Cholesterol/HDL:CHD Risk Coronary Heart Disease Risk Table                     Men   Women  1/2 Average Risk   3.4   3.3  Average Risk       5.0   4.4  2 X Average Risk   9.6   7.1  3 X Average Risk  23.4   11.0        Use the calculated Patient Ratio above and the CHD Risk  Table to determine  the patient's CHD Risk.        ATP III CLASSIFICATION (LDL):  <100     mg/dL   Optimal  161-096  mg/dL   Near or Above                    Optimal  130-159  mg/dL   Borderline  045-409  mg/dL   High  >811     mg/dL   Very High   TSH     Status: None   Collection Time: 06/21/16  7:13 AM  Result Value Ref Range   TSH 2.171 0.350 - 4.500 uIU/mL  Hemoglobin A1c     Status: None   Collection Time: 06/21/16  7:13 AM  Result Value Ref Range   Hgb A1c MFr Bld 5.2 4.0 - 6.0 %  Prolactin     Status: Abnormal   Collection Time: 06/21/16  7:13 AM  Result Value Ref Range   Prolactin 21.0 (H) 4.0 - 15.2 ng/mL    Comment: (NOTE) Performed At: [facility] [address] [place], [place] [id number] Mila Homer MD OZ:[id number]     Blood Alcohol level:  Lab Results  Component Value Date   [facility] <5 06/18/2016   ETH <5 06/17/2015    Metabolic Disorder Labs: Lab Results  Component Value Date   HGBA1C 5.2 06/21/2016   MPG 100 04/09/2012   Lab Results  Component Value Date   PROLACTIN 21.0* 06/21/2016   Lab Results  Component Value Date   CHOL 193 06/21/2016   TRIG 226* 06/21/2016   HDL 60 06/21/2016   CHOLHDL 3.2 06/21/2016   VLDL 45* 06/21/2016   LDLCALC 88 06/21/2016    Physical Findings: AIMS:  , ,  ,  ,    CIWA:  CIWA-Ar Total: 8 COWS:     Musculoskeletal: Strength & Muscle Tone: within normal limits Gait & Station: normal Patient leans: N/A  Psychiatric Specialty Exam: Physical Exam  Nursing note and vitals reviewed.   Review of Systems  Neurological: Positive for tremors.  Psychiatric/Behavioral: Positive for depression, suicidal ideas and substance abuse.  All other systems reviewed and are negative.   Blood pressure 121/83, pulse 61, temperature 97.8 F (36.6 C), temperature source Oral, resp. rate 20, height 5\' 9"  (1.753 m), weight 76.204 kg (168 lb), SpO2 100 %.Body mass index is 24.8 kg/(m^2).  General Appearance: Casual   Eye Contact:  Good  Speech:  Slurred  Volume:  Normal  Mood:  Depressed and Irritable  Affect:  Congruent  Thought Process:  Disorganized  Orientation:  Full (Time, Place, and Person)  Thought Content:  Illogical  Suicidal Thoughts:  Yes.  with intent/plan  Homicidal Thoughts:  No  Memory:  Immediate;   Fair Recent;   Fair Remote;   Fair  Judgement:  Poor  Insight:  Lacking  Psychomotor Activity:  Psychomotor Retardation  Concentration:  Concentration: Fair and Attention Span: Fair  Recall:  [employer] of Knowledge:  Fair  Language:  Fair  Akathisia:  No  Handed:  Right  AIMS (if indicated):     Assets:  Communication Skills Desire for Improvement Financial Resources/Insurance Physical Health Resilience Social Support  ADL's:  Intact  Cognition:  WNL  Sleep:  Number of Hours: 6.5     Treatment Plan Summary: Daily contact with patient to assess and evaluate symptoms and progress in treatment and Medication management   Mr. Sanker is a 50 year old male with a history of depression, anxiety,  and substance abuse admitted for suicidal ideation with a plan to jump off the overpass in the context of treatment noncompliance and severe social stressors.  1. Suicidal ideation. The patient is able to contract for safety in the hospital.  2. Mood. He was restarted on Tegretol for mood stabilization.  3. Alcohol use. He is on Librium taper. We decrease dose to 25 mg tid and offered Ativan as needed. We started Zofran and Protonix. We added Haldol for psychosis/agitation.  4. Substance abuse treatment. The patient was somewhat interested in substance abuse treatment initially. SW to discuss options.  5. Hypertension. He is on atenolol and lisinopril.   6. Smoking. Nicotine patch is available.  7. Metabolic syndrome screening. Lipid panel, TSH, and hemoglobin A1c are normal. Prolactin 21.  8. Insomnia. Trazodone is available.   9. PTSD. He was restarted on Minipress for  nightmares.   10. Disposition. To be established.  Kristine LineaJolanta Pucilowska, MD 06/22/2016, 1:02 PM

## 2016-06-22 NOTE — Plan of Care (Signed)
Problem: Self-Concept: Goal: Ability to disclose and discuss suicidal ideas will improve Outcome: Progressing Patient able to verbalize feelings to nursing staff.

## 2016-06-22 NOTE — [facility] Group Notes (Signed)
[facility] Group Notes:  (Nursing/MHT/Case Management/Adjunct)  Date:  06/22/2016  Time:  4:14 AM  Type of Therapy:  Psychoeducational Skills  Participation Level:  Active  Participation Quality:  Attentive  Affect:  Flat  Cognitive:  Oriented  Insight:  Appropriate  Engagement in Group:  Improving and Lacking  Modes of Intervention:  Discussion and Exploration  Summary of Progress/Problems:  Foy GuadalajaraJasmine R Anaid Haney 06/22/2016, 4:14 AM
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
## 2016-06-22 NOTE — [facility] Group Notes (Signed)
[facility] Group Notes:  (Nursing/MHT/Case Management/Adjunct)  Date:  06/22/2016  Time:  4:00 PM  Type of Therapy:  Relaxation Group   Participation Level:  Active  Participation Quality:  Appropriate  Affect:  Appropriate  Cognitive:  Appropriate  Insight:  Appropriate  Engagement in Group:  Engaged  Modes of Intervention:  Socialization  Summary of Progress/Problems:  Chase Moss Chase Moss Chase Moss 06/22/2016, 4:00 PM

## 2016-06-22 NOTE — Progress Notes (Signed)
Patient noted to be experiencing withdrawal symptoms (sweats, anxiety, agitation, tremors). Patient stated to nursing staff that he was feeling agitated, "I feel agitated, like I want to do something." MD on call, Dr. Alvester MorinBell notified. Ativan 1mg  PO one-time order given and medication administered at 1805. Patient encouraged to hydrate and rest in bed.

## 2016-06-22 NOTE — Progress Notes (Signed)
Recreation Therapy Notes  Date: 06.30.17 Time: 9:30 am Location: Craft Room  Group Topic: Coping Skills  Goal Area(s) Addresses:  Patient will participate in healthy coping skill. Patient will verbalize one emotion experienced during activity.  Behavioral Response: Arrived late, Attentive  Intervention: Coloring  Activity: Patients were given coloring sheets to color and were instructed to think about the emotions they were feeling and what they were focused on.  Education: LRT educated patients on healthy coping skills.  Education Outcome: In group clarification offered  Clinical Observations/Feedback: Patient arrived to group at approximately 10:13 am. Patient colored coloring sheet. Patient did not contribute to group discussion.  Jacquelynn CreeGreene,Candace Ramus M, LRT/CTRS 06/22/2016 10:26 AM

## 2016-06-22 NOTE — Progress Notes (Signed)
Recreation Therapy Notes  Date: 06.30.17 Time: 9:30 am Location: Craft Room  Group Topic: Coping Skills  Goal Area(s) Addresses:  Patient will participate in healthy coping skill. Patient will verbalize one emotion experienced during activity.  Behavioral Response: Did not attend  Intervention: Coloring  Activity: Patients were given coloring sheets to color and were instructed to think about the emotions they were feeling and what they were focused on.  Education: LRT educated patients on healthy coping skills.  Education Outcome: Patient did not attend group.  Clinical Observations/Feedback: Patient did not attend group.  Jacquelynn CreeGreene,Elizabth Palka M, LRT/CTRS 06/22/2016 10:20 AM
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
## 2016-06-22 NOTE — Progress Notes (Signed)
Patient Recv'd one time dose of Ativan for concern of withdrawal from nursing. CIWA monitoring is ordered and patient has tid dosing of Librium to cover withdrawal. If patient becomes agitated, please give Haldol as previously ordered.   Lockie ParesBell,  Tarren Sabree L, MD

## 2016-06-23 MED ORDER — CHLORDIAZEPOXIDE HCL 25 MG PO CAPS
25.0000 mg | ORAL_CAPSULE | Freq: Three times a day (TID) | ORAL | Status: DC
  Administered 2016-06-24 – 2016-06-25 (×5): 25 mg via ORAL
  Filled 2016-06-23 (×5): qty 1

## 2016-06-23 MED ORDER — CHLORDIAZEPOXIDE HCL 25 MG PO CAPS
50.0000 mg | ORAL_CAPSULE | Freq: Three times a day (TID) | ORAL | Status: AC
  Administered 2016-06-23 (×2): 50 mg via ORAL
  Filled 2016-06-23 (×2): qty 2

## 2016-06-23 NOTE — Progress Notes (Signed)
D: Patient is alert and oriented  Medicated and drowsy on the unit this shift. Patient attended and actively participated in groups today. Patient denies suicidal ideation, homicidal ideation, auditory or visual hallucinations at the present time.  A: Scheduled medications are administered to patient as per MD orders. Emotional support and encouragement are provided. Patient is maintained on q.15 minute safety checks. Patient is informed to notify staff with questions or concerns. R: No adverse medication reactions are noted. Patient is cooperative with medication administration and treatment plan today. Patient is not  Receptive due to medications causing drowsiness.Depression 2/10,anxiety 6/10   Pt  cooperative on the unit at this time. Patient interacts  l with others on the unit this shift. Patient contracts for safety at this time. Patient remains safe at this time.

## 2016-06-23 NOTE — Progress Notes (Signed)
[facility]  06/23/2016 10:34 AM Chase DowGregory Moss  MRN:  [id number]  Subjective:  "sweating a lot"  [name] is a 50 year old male with history of depression, severe PTSD, and alcoholism admitted for suicidal ideation with a plan to jump off the overpass in the context of treatment noncompliance and relapse on alcohol. He's been drinking 4-5 40 ounce beers daily. He was restarted on medications. He was started on Librium taper but had severe symptoms of withdrawal last vomiting and Librium was increased to 50 mg 3 times daily. Today he seems that they and I will decrease Librium to 25 mg and lower the dose of Ativan available.  Today the patient was seen lying in bed. He states that he is sweaty and doesn't feel well. Denies any pain. Patient stated that he didn't feel like talking today but denies SI/HI/AVH. Complains that his room is too hot. He received one dose of Ativan that "helped me be less agitated." He is unsure if it helped with withdrawal symptoms but stated that he "just want(s) to go home."   Most recent CIWA score 9, 4.    Principal Problem: Major depressive disorder, recurrent severe without psychotic features (HCC) Diagnosis:   Patient Active Problem List   Diagnosis Date Noted  . HTN (hypertension) [I10] 06/20/2016  . Major depressive disorder, recurrent severe without psychotic features (HCC) [F33.2] 06/20/2016  . Tobacco use disorder [F17.200] 06/20/2016  . Trauma [T14.90] 07/27/2015  . Cannabis use disorder, moderate, dependence (HCC) [F12.20]   . PTSD (post-traumatic stress disorder) [F43.10]   . Substance induced mood disorder (HCC) [F19.94] 07/07/2014  . Alcohol use disorder, moderate, dependence (HCC) [F10.20] 04/10/2012  . Suicidal thoughts [R45.851] 04/04/2012   Total Time spent with patient: 20 minutes  Past Psychiatric History:  depression, PTSD, alcoholism.  Past Medical History:  Past Medical History  Diagnosis Date  . Hypertension   .  Depression   . PTSD (post-traumatic stress disorder)   . Bipolar 1 disorder (HCC)    History reviewed. No pertinent past surgical history. Family History:  Family History  Problem Relation Age of Onset  . Heart attack Other    Family Psychiatric  History: See H&P. Social History:  History  Alcohol Use  . 1.2 oz/week  . 2 Cans of beer per week    Comment: daily alcohol     History  Drug Use  . Yes  . Special: Marijuana    Comment: several times a day    Social History   Social History  . Marital Status: Divorced    Spouse Name: N/A  . Number of Children: N/A  . Years of Education: N/A   Social History Main Topics  . Smoking status: Current Every Day Smoker -- 1.00 packs/day for 30 years    Types: Cigarettes  . Smokeless tobacco: Never Used  . Alcohol Use: 1.2 oz/week    2 Cans of beer per week     Comment: daily alcohol  . Drug Use: Yes    Special: Marijuana     Comment: several times a day  . Sexual Activity: Not Asked   Other Topics Concern  . None   Social History Narrative   Additional Social History:                         Sleep: Poor  Appetite:  Poor  Current Medications: Current Facility-Administered Medications  Medication Dose Route Frequency Provider Last Rate Last Dose  .  acetaminophen (TYLENOL) tablet 650 mg  650 mg Oral Q6H PRN Beau Fanny, FNP      . alum & mag hydroxide-simeth (MAALOX/MYLANTA) 200-200-20 MG/5ML suspension 30 mL  30 mL Oral Q4H PRN Beau Fanny, FNP      . atenolol (TENORMIN) tablet 50 mg  50 mg Oral Daily Beau Fanny, FNP   50 mg at 06/23/16 1004  . carbamazepine (TEGRETOL) tablet 200 mg  200 mg Oral BID PC Beau Fanny, FNP   200 mg at 06/23/16 1004  . chlordiazePOXIDE (LIBRIUM) capsule 25 mg  25 mg Oral TID Shari Prows, MD   25 mg at 06/23/16 1005  . haloperidol (HALDOL) tablet 2 mg  2 mg Oral Q6H PRN Shari Prows, MD   2 mg at 06/22/16 2253  . ibuprofen (ADVIL,MOTRIN) tablet 600 mg   600 mg Oral Q8H PRN Beau Fanny, FNP   600 mg at 06/21/16 0831  . lisinopril (PRINIVIL,ZESTRIL) tablet 5 mg  5 mg Oral Daily Beau Fanny, FNP   5 mg at 06/23/16 1004  . magnesium hydroxide (MILK OF MAGNESIA) suspension 30 mL  30 mL Oral Daily PRN Beau Fanny, FNP      . nicotine (NICODERM CQ - dosed in mg/24 hours) patch 21 mg  21 mg Transdermal Daily Beau Fanny, FNP   21 mg at 06/21/16 1000  . ondansetron (ZOFRAN-ODT) disintegrating tablet 8 mg  8 mg Oral Q8H PRN Jolanta B Pucilowska, MD      . pantoprazole (PROTONIX) EC tablet 40 mg  40 mg Oral Daily Jolanta B Pucilowska, MD   40 mg at 06/23/16 1004  . prazosin (MINIPRESS) capsule 4 mg  4 mg Oral QHS Audery Amel, MD   4 mg at 06/22/16 2139  . traZODone (DESYREL) tablet 100 mg  100 mg Oral QHS Shari Prows, MD   100 mg at 06/22/16 2138    Lab Results:  No results found for this or any previous visit (from the past 48 hour(s)).  Blood Alcohol level:  Lab Results  Component Value Date   ETH <5 06/18/2016   ETH <5 06/17/2015    Metabolic Disorder Labs: Lab Results  Component Value Date   HGBA1C 5.2 06/21/2016   MPG 100 04/09/2012   Lab Results  Component Value Date   PROLACTIN 21.0* 06/21/2016   Lab Results  Component Value Date   CHOL 193 06/21/2016   TRIG 226* 06/21/2016   HDL 60 06/21/2016   CHOLHDL 3.2 06/21/2016   VLDL 45* 06/21/2016   LDLCALC 88 06/21/2016    Physical Findings: AIMS: Facial and Oral Movements Muscles of Facial Expression: None, normal Lips and Perioral Area: None, normal Jaw: None, normal Tongue: None, normal,Extremity Movements Upper (arms, wrists, hands, fingers): None, normal Lower (legs, knees, ankles, toes): None, normal, Trunk Movements Neck, shoulders, hips: None, normal, Overall Severity Severity of abnormal movements (highest score from questions above): None, normal Incapacitation due to abnormal movements: None, normal Patient's awareness of abnormal movements  (rate only patient's report): No Awareness, Dental Status Current problems with teeth and/or dentures?: No Does patient usually wear dentures?: No  CIWA:  CIWA-Ar Total: 9 COWS:     Musculoskeletal: Strength & Muscle Tone: within normal limits Gait & Station: normal Patient leans: N/A  Psychiatric Specialty Exam: Physical Exam  Nursing note and vitals reviewed.   Review of Systems  Neurological: Positive for tremors.  Psychiatric/Behavioral: Positive for depression, suicidal ideas and substance  abuse.  All other systems reviewed and are negative.   Blood pressure 133/77, pulse 78, temperature 98.1 F (36.7 C), temperature source Oral, resp. rate 20, height 5\' 9"  (1.753 m), weight 76.204 kg (168 lb), SpO2 100 %.Body mass index is 24.8 kg/(m^2).  General Appearance: Casual  Eye Contact:  Minimal  Speech:  Normal Rate  Volume:  Normal  Mood:  Depressed and Irritable  Affect:  Congruent  Thought Process:  Disorganized  Orientation:  Full (Time, Place, and Person)  Thought Content:  Illogical  Suicidal Thoughts:  No but unsure if patient is trustworthy  Homicidal Thoughts:  No  Memory:  Immediate;   Fair Recent;   Fair Remote;   Fair  Judgement:  Poor  Insight:  Lacking  Psychomotor Activity:  Psychomotor Retardation  Concentration:  Concentration: Fair and Attention Span: Fair  Recall:  [name]  Fund of Knowledge:  Fair  Language:  Fair  Akathisia:  No  Handed:  Right  AIMS (if indicated):     Assets:  Communication Skills Desire for Improvement Financial Resources/Insurance Physical Health Resilience Social Support  ADL's:  Intact  Cognition:  WNL  Sleep:  Number of Hours: 6.5     Treatment Plan Summary: Daily contact with patient to assess and evaluate symptoms and progress in treatment and Medication management   [name] is a 50 year old male with a history of depression, anxiety, and substance abuse admitted for suicidal ideation with a plan to jump off the  overpass in the context of treatment noncompliance and severe social stressors.  1. Suicidal ideation. The patient is able to contract for safety in the hospital.  2. Mood. He was restarted on Tegretol for mood stabilization.  3. Alcohol use. He is on Librium taper. We decrease dose to 25 mg tid and offered Ativan as needed. We started Zofran and Protonix. We added Haldol for psychosis/agitation.  4. Substance abuse treatment. The patient was somewhat interested in substance abuse treatment initially. SW to discuss options.  5. Hypertension. He is on atenolol and lisinopril.   6. Smoking. Nicotine patch is available.  7. Metabolic syndrome screening. Lipid panel, TSH, and hemoglobin A1c are normal. Prolactin 21.  8. Insomnia. Trazodone is available.   9. PTSD. He was restarted on Minipress for nightmares.   10. Disposition. To be established.  7/1: patient complains of profuse sweating and withdrawal symptoms. His CIWA is 9 today and 4 yesterday. He appears rather uncomfortable. Will give two doses of librium 50 mg and see if this affects his subjective symptoms. Plan to continue Librium taper at that point if tolerated and resume Librium 25 mg tid tomorrow am.  Repeat CIWA q shift.    Lockie ParesBell,  Zuri Lascala L, MD 06/23/2016, 10:34 AM

## 2016-06-23 NOTE — [facility] Group Notes (Signed)
[facility] Group Notes:  (Nursing/MHT/Case Management/Adjunct)  Date:  06/23/2016  Time:  12:55 PM  Type of Therapy:  Psychoeducational Skills  Participation Level:  Did Not Attend   Chase Moss Chase Moss 06/23/2016, 12:55 PM 

## 2016-06-23 NOTE — [facility] Group Notes (Signed)
[facility] LCSW Group Therapy   06/22/2016  1pm  Type of Therapy: Group Therapy   Participation Level: Did Not Attend. Patient invited to participate but declined.    Searcy Miyoshi F. Sheyenne Konz, MSW, LCSWA, LCAS  06/22/16   

## 2016-06-23 NOTE — [facility] Group Notes (Signed)
[facility] LCSW Group Therapy   06/22/2016 1:00 PM   Type of Therapy: Group Therapy   Participation Level: Active   Participation Quality: Attentive, Sharing and Supportive   Affect: Depressed and Flat   Cognitive: Alert and Oriented   Insight: Developing/Improving and Engaged   Engagement in Therapy: Developing/Improving and Engaged   Modes of Intervention: Clarification, Confrontation, Discussion, Education, Exploration, Limit-setting, Orientation, Problem-solving, Rapport Building, Dance movement psychotherapisteality Testing, Socialization and Support   Summary of Progress/Problems: The topic for today was feelings about relapse. Pt was to discuss what relapse prevention is to them and identified triggers that they are on the path to relapse. Pt processed their feeling towards relapse and was able to relate to peers. Pt was to discuss coping skills that can be used for relapse prevention. Pt began group five minutes before it was over.  Pt's thought and speech were disorganized. Pt was polite and cooperative with the CSW and other group members and focused and attentive to the topics discussed and the sharing of others for the remainder of the group.    Chase PeaJonathan F. Chairty Toman, MSW, LCSWA, LCAS

## 2016-06-23 NOTE — Progress Notes (Signed)
D:  Per pt self inventory pt reports sleeping fair, appetite good, energy level high, ability to pay attention good, rates depression at a 5 out of 10, hopelessness at a 4 out of 10, anxiety at a 5 out of 10, denies SI/HI/AVH, goal today: "staying positive of my future, participate in activities", c/o depression and anxiety, some withdrawal s/s but states that it has been improving.     A:  Emotional support provided, Encouraged pt to continue with treatment plan and attend all group activities, q15 min checks maintained for safety.  R:  Pt is receptive, going to groups, pleasant and cooperative with staff and other patients on the unit.

## 2016-06-23 NOTE — [facility] Group Notes (Signed)
[facility] LCSW Group Therapy  06/23/2016 4:49 PM  Type of Therapy:  Group Therapy  Participation Level:  Active  Participation Quality:  Attentive  Affect:  Appropriate  Cognitive:  Appropriate  Insight:  Improving  Engagement in Therapy:  Engaged  Modes of Intervention:  Discussion, Education, Socialization and Support  Summary of Progress/Problems:Group discussed unhelpful thinking patterns and ways to challenge unhelpful thinking.  Utilized Designer, industrial/productconfrontation and affirmations to practice changing thoughts.  Group shared ways that unhelpful thinking has impacted them being admitted to the hospital and use affirmation cards to affirm one another's goals.  Glennon MacLaws, Kawehi Hostetter P, MSW, LCSW 06/23/2016, 4:49 PM

## 2016-06-23 NOTE — Plan of Care (Signed)
Problem: Education: Goal: Knowledge of disease or condition will improve Outcome: Not Progressing Patient requires reinforcement of his diagnosis and  Treatment [place] RN

## 2016-06-23 NOTE — [facility] Group Notes (Signed)
[facility] Group Notes:  (Nursing/MHT/Case Management/Adjunct)  Date:  06/23/2016  Time:  10:19 PM  Type of Therapy:  Evening Wrap-up Group     Participation Level:  Did Not Attend  Participation Quality:  N/A  Affect:  N/A  Cognitive:  N/A  Insight:  None  Engagement in Group:  N/A  Modes of Intervention:  Discussion  Summary of Progress/Problems:  Chase MorrowChelsea Moss Chase Moss 06/23/2016, 10:19 PM

## 2016-06-24 MED ORDER — PRAZOSIN HCL 2 MG PO CAPS: 4.0000 mg | ORAL_CAPSULE | Freq: Every day | ORAL | Status: DC

## 2016-06-24 MED ORDER — LISINOPRIL 5 MG PO TABS: 5.0000 mg | ORAL_TABLET | Freq: Every day | ORAL | Status: DC

## 2016-06-24 MED ORDER — PANTOPRAZOLE SODIUM 40 MG PO TBEC: 40.0000 mg | DELAYED_RELEASE_TABLET | Freq: Every day | ORAL | Status: DC

## 2016-06-24 MED ORDER — CARBAMAZEPINE 200 MG PO TABS: 200.0000 mg | ORAL_TABLET | Freq: Two times a day (BID) | ORAL | Status: DC

## 2016-06-24 MED ORDER — TRAZODONE HCL 100 MG PO TABS: 100.0000 mg | ORAL_TABLET | Freq: Every day | ORAL | Status: DC

## 2016-06-24 MED ORDER — ATENOLOL 50 MG PO TABS: ORAL_TABLET | ORAL | Status: DC

## 2016-06-24 NOTE — Progress Notes (Signed)
D: Pt denies SI/HI/AVH, affect flat but brightens upon approach, thoughts are organized no bizarre episode noted. Pt is pleasant and cooperative. Pt appears less anxious and he is interacting with peers and staff appropriately.  A: Pt was offered support and encouragement. Pt was given scheduled medications. Pt was encouraged to attend groups. Q 15 minute checks were done for safety.  R:Pt attends groups and interacts well with peers and staff. Pt is taking medication. Pt has no complaints.Pt receptive to treatment and safety maintained on unit.

## 2016-06-24 NOTE — [facility] Group Notes (Signed)
[facility] Group Notes:  (Nursing/MHT/Case Management/Adjunct)  Date:  06/24/2016  Time:  3:20 PM  Type of Therapy:  Psychoeducational Skills  Participation Level:  Active  Participation Quality:  Appropriate, Attentive and Sharing  Affect:  Appropriate  Cognitive:  Alert and Appropriate  Insight:  Appropriate  Engagement in Group:  Engaged  Modes of Intervention:  Discussion, Education and Support  Summary of Progress/Problems:  Lynelle SmokeCara Travis Saray Capasso 06/24/2016, 3:20 PM

## 2016-06-24 NOTE — Plan of Care (Signed)
Problem: Safety: Goal: Ability to remain free from injury will improve Outcome: Progressing Patient is still experiencing withdrawal symptoms(seats and anxiety), but states that he feels way better than he did when he came in.

## 2016-06-24 NOTE — Progress Notes (Signed)
[facility] MD Progress Note  06/24/2016 12:48 PM Chase Moss  MRN:  [id number]  Subjective:     Chase Moss is a 50 year old male with history of depression, severe PTSD, and alcoholism admitted for suicidal ideation with a plan to jump off the overpass in the context of treatment noncompliance and relapse on alcohol. He's been drinking 4-5 40 ounce beers daily. He was restarted on medications. He was started on Librium taper but had severe symptoms of withdrawal last vomiting and Librium was increased to 50 mg 3 times daily.    Today the patient was seen lying in bed facing the wall. He states he's feeling all right today. He slept better but had several bad dreams. He is unsure if he is still withdrawing but then later said he feels better today than yesterday. Decreased sweating. Denies SI/HI/AVH.     Per nursing: D: Pt denies SI/HI/AVH, affect flat but brightens upon approach, thoughts are organized no bizarre episode noted. Pt is pleasant and cooperative. Pt appears less anxious and he is interacting with peers and staff appropriately.  A: Pt was offered support and encouragement. Pt was given scheduled medications. Pt was encouraged to attend groups. Q 15 minute checks were done for safety.  R:Pt attends groups and interacts well with peers and staff. Pt is taking medication. Pt has no complaints.Pt receptive to treatment and safety maintained on unit.  Principal Problem: Major depressive disorder, recurrent severe without psychotic features (HCC) Diagnosis:   Patient Active Problem List   Diagnosis Date Noted  . HTN (hypertension) [I10] 06/20/2016  . Major depressive disorder, recurrent severe without psychotic features (HCC) [F33.2] 06/20/2016  . Tobacco use disorder [F17.200] 06/20/2016  . Trauma [T14.90] 07/27/2015  . Cannabis use disorder, moderate, dependence (HCC) [F12.20]   . PTSD (post-traumatic stress disorder) [F43.10]   . Substance induced mood disorder (HCC) [F19.94] 07/07/2014   . Alcohol use disorder, moderate, dependence (HCC) [F10.20] 04/10/2012  . Suicidal thoughts [R45.851] 04/04/2012   Total Time spent with patient: 20 minutes  Past Psychiatric History:  depression, PTSD, alcoholism.  Past Medical History:  Past Medical History  Diagnosis Date  . Hypertension   . Depression   . PTSD (post-traumatic stress disorder)   . Bipolar 1 disorder (HCC)    History reviewed. No pertinent past surgical history. Family History:  Family History  Problem Relation Age of Onset  . Heart attack Other    Family Psychiatric  History: See H&P. Social History:  History  Alcohol Use  . 1.2 oz/week  . 2 Cans of beer per week    Comment: daily alcohol     History  Drug Use  . Yes  . Special: Marijuana    Comment: several times a day    Social History   Social History  . Marital Status: Divorced    Spouse Name: N/A  . Number of Children: N/A  . Years of Education: N/A   Social History Main Topics  . Smoking status: Current Every Day Smoker -- 1.00 packs/day for 30 years    Types: Cigarettes  . Smokeless tobacco: Never Used  . Alcohol Use: 1.2 oz/week    2 Cans of beer per week     Comment: daily alcohol  . Drug Use: Yes    Special: Marijuana     Comment: several times a day  . Sexual Activity: Not Asked   Other Topics Concern  . None   Social History Narrative   Additional Social History:  Sleep: Poor  Appetite:  Poor  Current Medications: Current Facility-Administered Medications  Medication Dose Route Frequency Provider Last Rate Last Dose  . acetaminophen (TYLENOL) tablet 650 mg  650 mg Oral Q6H PRN Beau FannyJohn C Withrow, FNP      . alum & mag hydroxide-simeth (MAALOX/MYLANTA) 200-200-20 MG/5ML suspension 30 mL  30 mL Oral Q4H PRN Beau FannyJohn C Withrow, FNP      . atenolol (TENORMIN) tablet 50 mg  50 mg Oral Daily Beau FannyJohn C Withrow, FNP   50 mg at 06/24/16 [id number]  . carbamazepine (TEGRETOL) tablet 200 mg  200 mg Oral BID  PC Beau FannyJohn C Withrow, FNP   200 mg at 06/24/16 0919  . chlordiazePOXIDE (LIBRIUM) capsule 25 mg  25 mg Oral TID Chase Paresiffani Moss Jessilynn Taft, MD   25 mg at 06/24/16 0918  . haloperidol (HALDOL) tablet 2 mg  2 mg Oral Q6H PRN Shari ProwsJolanta B Pucilowska, MD   2 mg at 06/23/16 2147  . ibuprofen (ADVIL,MOTRIN) tablet 600 mg  600 mg Oral Q8H PRN Beau FannyJohn C Withrow, FNP   600 mg at 06/21/16 0831  . lisinopril (PRINIVIL,ZESTRIL) tablet 5 mg  5 mg Oral Daily Beau FannyJohn C Withrow, FNP   5 mg at 06/24/16 0919  . magnesium hydroxide (MILK OF MAGNESIA) suspension 30 mL  30 mL Oral Daily PRN Beau FannyJohn C Withrow, FNP      . nicotine (NICODERM CQ - dosed in mg/24 hours) patch 21 mg  21 mg Transdermal Daily Beau FannyJohn C Withrow, FNP   21 mg at 06/23/16 1837  . ondansetron (ZOFRAN-ODT) disintegrating tablet 8 mg  8 mg Oral Q8H PRN Jolanta B Pucilowska, MD      . pantoprazole (PROTONIX) EC tablet 40 mg  40 mg Oral Daily Jolanta B Pucilowska, MD   40 mg at 06/24/16 0919  . prazosin (MINIPRESS) capsule 4 mg  4 mg Oral QHS Audery AmelJohn T Clapacs, MD   4 mg at 06/23/16 2147  . traZODone (DESYREL) tablet 100 mg  100 mg Oral QHS Shari ProwsJolanta B Pucilowska, MD   100 mg at 06/23/16 2147    Lab Results:  No results found for this or any previous visit (from the past 48 hour(s)).  Blood Alcohol level:  Lab Results  Component Value Date   ETH <5 06/18/2016   ETH <5 06/17/2015    Metabolic Disorder Labs: Lab Results  Component Value Date   HGBA1C 5.2 06/21/2016   MPG 100 04/09/2012   Lab Results  Component Value Date   PROLACTIN 21.0* 06/21/2016   Lab Results  Component Value Date   CHOL 193 06/21/2016   TRIG 226* 06/21/2016   HDL 60 06/21/2016   CHOLHDL 3.2 06/21/2016   VLDL 45* 06/21/2016   LDLCALC 88 06/21/2016    Physical Findings: AIMS: Facial and Oral Movements Muscles of Facial Expression: None, normal Lips and Perioral Area: None, normal Jaw: None, normal Tongue: None, normal,Extremity Movements Upper (arms, wrists, hands, fingers): None,  normal Lower (legs, knees, ankles, toes): None, normal, Trunk Movements Neck, shoulders, hips: None, normal, Overall Severity Severity of abnormal movements (highest score from questions above): None, normal Incapacitation due to abnormal movements: None, normal Patient's awareness of abnormal movements (rate only patient's report): No Awareness, Dental Status Current problems with teeth and/or dentures?: No Does patient usually wear dentures?: No  CIWA:  CIWA-Ar Total: 9 COWS:     Musculoskeletal: Strength & Muscle Tone: within normal limits Gait & Station: normal Patient leans: N/A  Psychiatric Specialty Exam: Physical Exam  Nursing note and vitals reviewed.   Review of Systems  Neurological: Positive for tremors.  Psychiatric/Behavioral: Positive for depression, suicidal ideas and substance abuse.  All other systems reviewed and are negative.   Blood pressure 113/74, pulse 77, temperature 97.6 F (36.4 C), temperature source Oral, resp. rate 18, height 5\' 9"  (1.753 m), weight 76.204 kg (168 lb), SpO2 100 %.Body mass index is 24.8 kg/(m^2).  General Appearance: Casual  Eye Contact:  Minimal  Speech:  Normal Rate  Volume:  Normal  Mood:  Depressed and Irritable  Affect:  Congruent  Thought Process:  Linear  Orientation:  Full (Time, Place, and Person)  Thought Content:  Illogical  Suicidal Thoughts:  No    Homicidal Thoughts:  No  Memory:  Immediate;   Fair Recent;   Fair Remote;   Fair  Judgement:  Poor  Insight:  Lacking  Psychomotor Activity:  Psychomotor Retardation  Concentration:  Concentration: Fair and Attention Span: Fair  Recall:  [name]  Fund of Knowledge:  Fair  Language:  Fair  Akathisia:  No  Handed:  Right  AIMS (if indicated):     Assets:  Communication Skills Desire for Improvement Financial Resources/Insurance Physical Health Resilience Social Support  ADL's:  Intact  Cognition:  WNL  Sleep:  Number of Hours: 6.5     Treatment Plan  Summary: Daily contact with patient to assess and evaluate symptoms and progress in treatment and Medication management   Chase Moss is a 50 year old male with a history of depression, anxiety, and substance abuse admitted for suicidal ideation with a plan to jump off the overpass in the context of treatment noncompliance and severe social stressors.  1. Suicidal ideation. The patient is able to contract for safety in the hospital.  2. Mood. He was restarted on Tegretol for mood stabilization.  3. Alcohol use. He is on Librium taper. We decrease dose to 25 mg tid and offered Ativan as needed. We started Zofran and Protonix. We added Haldol for psychosis/agitation.  4. Substance abuse treatment. The patient was somewhat interested in substance abuse treatment initially. SW to discuss options.  5. Hypertension. He is on atenolol and lisinopril.   6. Smoking. Nicotine patch is available.  7. Metabolic syndrome screening. Lipid panel, TSH, and hemoglobin A1c are normal. Prolactin 21.  8. Insomnia. Trazodone is available.   9. PTSD. He was restarted on Minipress for nightmares.   10. Disposition. To be established.  7/1: patient complains of profuse sweating and withdrawal symptoms. His CIWA is 9 today and 4 yesterday. He appears rather uncomfortable. Will give two doses of librium 50 mg and see if this affects his subjective symptoms. Plan to continue Librium taper at that point if tolerated and resume Librium 25 mg tid tomorrow am.  Repeat CIWA q shift.   7/2: He has fewer withdrawal symptoms. Continue with taper as ordered. Patient denies SI/HI/AVh but seems uninterested/guarded and does not discuss his feelings.   Chase Moss,  Chase Welliver L, MD 06/24/2016, 12:48 PM

## 2016-06-24 NOTE — Progress Notes (Signed)
D: Pt denies SI/HI/AVH. Pt is pleasant and cooperative. Pt stated he was ready to move on, but is having difficulty with housing , staying in a hotel and it is costing a lot of money.   A: Pt was offered support and encouragement. Pt was given scheduled medications. Pt was encourage to attend groups. Q 15 minute checks were done for safety.   R:Pt attends groups and interacts well with peers and staff. Pt is taking medication. Pt has no complaints.Pt receptive to treatment and safety maintained on unit.
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
## 2016-06-24 NOTE — BHH Group Notes (Addendum)
[facility] LCSW Group Therapy   06/24/2016  11AM  Type of Therapy: Group Therapy   Participation Level: Did Not Attend. Patient invited to participate but declined.    Harpreet Pompey F. Raziya Aveni, MSW, LCSWA, LCAS     

## 2016-06-24 NOTE — Progress Notes (Signed)
Patient noted to be isolative to his room. Seen asleep in bed with towel on his head and covered up all the way to his head. Patient stated that he feels much better today and thinks that he is improving. However, he complained of having sweats. He did not attend groups and remained sleep in his room. Remains medication compliant. Patient encouraged to attend groups and interact with peers. He verbalized understanding.

## 2016-06-24 NOTE — Discharge Summary (Signed)
Physician Discharge Summary Note  Patient:  Chase Moss is an 50 y.o., male MRN:  [id number] DOB:  05/16/1966 Patient phone:  [phone] (home)  Patient address:   [address], [address] [place] [address],  Total Time spent with patient: 30 minutes  Date of Admission:  06/20/2016 Date of Discharge: 06/25/2016  Reason for Admission:  Suicidal ideation.  Identifying data. Chase Moss is a 50 year old male with history of depression, anxiety, mood instability, and substance use.  Chief complaint. "I'm suicidal."  History of present illness. Information was obtained from the patient and the chart. The patient has a long history of mental illness and substance abuse with multiple psychiatric hospitalizations. He discontinued his medications a year and a half ago after his mother passed away. He became increasingly depressed with poor sleep, decreased appetite, anhedonia, guilt and hopelessness worthlessness and poor energy and concentration, social isolation and crying spells. He has had frequent thoughts of suicide. In the past several weeks his depression worsen he became increasingly anxious with returning symptoms of PTSD. He relapsed on cocaine and became suicidal with a plan to jump off the overpass. He came to the hospital. He denies psychotic symptoms or symptoms suggestive of bipolar mania. He admits to drinking beer daily and recent relapse on cocaine. He has been positive for benzodiazepines on admission also.  Past psychiatric history. He has been hospitalized multiple times 2012, 2013, 2015 and 2016 for depression and substance abuse. He reports suicide attempts by overdose. He denies ever trying substance abuse treatment. He is unable to name any medications that were used in the past. They felt that they were helpful "sometimes". He was diagnosed with depression, polysubstance dependence, PTSD. His PTSD stems from experience in prison.  Family psychiatric history.  Nonreported.  Social history. He lost his job in [employer] reportedly to elevated blood pressure. He's been living [place] for the past 3 months. He has a sister but does not get along with her husband. He has no other support. He is uninsured.  Principal Problem: Major depressive disorder, recurrent severe without psychotic features [facility]HCC[facility] Discharge Diagnoses: Patient Active Problem List   Diagnosis Date Noted  . HTN (hypertension) [I10] 06/20/2016  . Major depressive disorder, recurrent severe without psychotic features (HCC) [F33.2] 06/20/2016  . Tobacco use disorder [F17.200] 06/20/2016  . Trauma [T14.90] 07/27/2015  . Cannabis use disorder, moderate, dependence (HCC) [F12.20]   . PTSD (post-traumatic stress disorder) [F43.10]   . Substance induced mood disorder (HCC) [F19.94] 07/07/2014  . Alcohol use disorder, moderate, dependence (HCC) [F10.20] 04/10/2012  . Suicidal thoughts [R45.851] 04/04/2012    Past Medical History:  Past Medical History  Diagnosis Date  . Hypertension   . Depression   . PTSD (post-traumatic stress disorder)   . Bipolar 1 disorder (HCC)    History reviewed. No pertinent past surgical history. Family History:  Family History  Problem Relation Age of Onset  . Heart attack Other    Social History:  History  Alcohol Use  . 1.2 oz/week  . 2 Cans of beer per week    Comment: daily alcohol     History  Drug Use  . Yes  . Special: Marijuana    Comment: several times a day    Social History   Social History  . Marital Status: Divorced    Spouse Name: N/A  . Number of Children: N/A  . Years of Education: N/A   Social History Main  Topics  . Smoking status: Current Every Day Smoker -- 1.00 packs/day for 30 years    Types: Cigarettes  . Smokeless tobacco: Never Used  . Alcohol Use: 1.2 oz/week    2 Cans of beer per week     Comment: daily alcohol  . Drug Use: Yes    Special: Marijuana     Comment:  several times a day  . Sexual Activity: Not Asked   Other Topics Concern  . None   Social History Narrative    Hospital Course:    Chase Moss is a 50 year old male with a history of depression, anxiety, and substance abuse admitted for suicidal ideation with a plan to jump off the overpass in the context of treatment noncompliance and severe social stressors.  1. Suicidal ideation. This has resolved. The patient is able to contract for safety. He is forward thinking and optimistic about the future.   2. Mood. He was restarted on Tegretol for mood stabilization.  3. Alcohol use. He completed Librium taper. Vital signs were stable.  4. Substance abuse treatment. The patientwill follow up with outpatient services.   5. Hypertension. He is on atenolol and lisinopril.   6. Smoking. Nicotine patch was available.  7. Metabolic syndrome screening. Lipid panel, TSH, and hemoglobin A1c are normal. Prolactin 21.  8. Insomnia. Trazodone was available.   9. PTSD. He was restarted on Minipress for nightmares.   10. GERD. He was on Protonix.   11. Disposition. To be established.  Physical Findings: AIMS: Facial and Oral Movements Muscles of Facial Expression: None, normal Lips and Perioral Area: None, normal Jaw: None, normal Tongue: None, normal,Extremity Movements Upper (arms, wrists, hands, fingers): None, normal Lower (legs, knees, ankles, toes): None, normal, Trunk Movements Neck, shoulders, hips: None, normal, Overall Severity Severity of abnormal movements (highest score from questions above): None, normal Incapacitation due to abnormal movements: None, normal Patient's awareness of abnormal movements (rate only patient's report): No Awareness, Dental Status Current problems with teeth and/or dentures?: No Does patient usually wear dentures?: No  CIWA:  CIWA-Ar Total: 9 COWS:     Musculoskeletal: Strength & Muscle Tone: within normal limits Gait & Station: normal Patient  leans: N/A  Psychiatric Specialty Exam: Physical Exam  Nursing note and vitals reviewed.   Review of Systems  Psychiatric/Behavioral: Positive for substance abuse.  All other systems reviewed and are negative.   Blood pressure 152/90, pulse 87, temperature 97.6 F (36.4 C), temperature source Oral, resp. rate 18, height  (1.753 m), weight 76.204 kg (168 lb), SpO2 100 %.Body mass index is 24.8 kg/(m^2).  See SRA.                                                  Sleep:  Number of Hours: 6.5     Have you used any form of tobacco in the last 30 days? (Cigarettes, Smokeless Tobacco, Cigars, and/or Pipes): Yes  Has this patient used any form of tobacco in the last 30 days? (Cigarettes, Smokeless Tobacco, Cigars, and/or Pipes) Yes, Yes, A prescription for an FDA-approved tobacco cessation medication was offered at discharge and the patient refused  Blood Alcohol level:  Lab Results  Component Value Date   [facility] <5 06/18/2016   ETH <5 06/17/2015    Metabolic Disorder Labs:  Lab Results  Component Value Date  HGBA1C 5.2 06/21/2016   MPG 100 04/09/2012   Lab Results  Component Value Date   PROLACTIN 21.0* 06/21/2016   Lab Results  Component Value Date   CHOL 193 06/21/2016   TRIG 226* 06/21/2016   HDL 60 06/21/2016   CHOLHDL 3.2 06/21/2016   VLDL 45* 06/21/2016   LDLCALC 88 06/21/2016    See Psychiatric Specialty Exam and Suicide Risk Assessment completed by Attending Physician prior to discharge.  Discharge destination:  Home  Is patient on multiple antipsychotic therapies at discharge:  No   Has Patient had three or more failed trials of antipsychotic monotherapy by history:  No  Recommended Plan for Multiple Antipsychotic Therapies: NA  Discharge Instructions    Diet - low sodium heart healthy    Complete by:  As directed      Increase activity slowly    Complete by:  As directed             Medication List    STOP taking these  medications        amoxicillin-clavulanate 875-125 MG tablet  Commonly known as:  AUGMENTIN      TAKE these medications      Indication   atenolol 50 MG tablet  Commonly known as:  TENORMIN  TAKE 50 MG BY MOUTH DAILY   Indication:  High Blood Pressure     carbamazepine 200 MG tablet  Commonly known as:  TEGRETOL  Take 1 tablet (200 mg total) by mouth 2 (two) times daily after a meal.   Indication:  Manic-Depression, mood stabilization     lisinopril 5 MG tablet  Commonly known as:  PRINIVIL,ZESTRIL  Take 1 tablet (5 mg total) by mouth daily.   Indication:  High Blood Pressure     pantoprazole 40 MG tablet  Commonly known as:  PROTONIX  Take 1 tablet (40 mg total) by mouth daily.   Indication:  Gastroesophageal Reflux Disease     prazosin 2 MG capsule  Commonly known as:  MINIPRESS  Take 2 capsules (4 mg total) by mouth at bedtime.   Indication:  PTSD     traZODone 100 MG tablet  Commonly known as:  DESYREL  Take 1 tablet (100 mg total) by mouth at bedtime.   Indication:  Trouble Sleeping           Follow-up Information    [facility] to [facility] [place].   Why:  Please arrive to the walk-in clinic Mondays, Wednesdays and Fridays between the hours of 9am-11:30am for an assessment for outpatient substance abuse treatmenr, medication managment and therapy.   Contact information:   [facility] [address] [place], [place] [address] Phone: [phone] Fax: [phone]      Follow-up recommendations:  Activity:  as tolerated. Diet:  low sodium heart healthy. Other:  keep follow up appointment.   Comments:    Signed: Kristine LineaJolanta Ritu Gagliardo, MD 06/24/2016, 9:11 PM

## 2016-06-25 NOTE — Progress Notes (Signed)
  [facility]  Will you be returning to the same living situation after discharge:  No. At discharge, do you have transportation home?: Yes,   [facility] providing PART BUS and [place] pass Do you have the ability to pay for your medications: Yes,     Release of information consent forms completed and in the chart;  Patient's signature needed at discharge.  Patient to Follow up at: Follow-up Information    [facility]   Specialty:  Behavioral Health   Why:  Walk-ins Monday-Friday between 8am-3pm., Hospital Follow up, Outpatient Medication Management, Therapy   Contact information:   [name] [place] [address] [phone]       Next level of care provider has [facility] Link:no  Safety Planning and Suicide Prevention discussed: Yes,     Have you used any form of tobacco in the last 30 days? (Cigarettes, Smokeless Tobacco, Cigars, and/or Pipes): Yes  Has patient been referred to the Quitline?: Patient refused referral  Patient has been referred for addiction treatment: Yes  Lyriq Finerty, Cleda DaubSara P, MSW, LCSW 06/25/2016, 4:26 PM

## 2016-06-25 NOTE — Progress Notes (Signed)
Patient denies SI/HI, denies A/V hallucinations. Patient verbalizes understanding of discharge instructions, follow up care and prescriptions. Patient given all belongings from  locker. Patient escorted out by staff, transported by security to the bus stop. 

## 2016-06-25 NOTE — BHH Group Notes (Addendum)
[facility] LCSW Group Therapy   06/25/2016 9:30am Type of Therapy: Group Therapy   Participation Level: Active   Participation Quality: Attentive, Sharing and Supportive   Affect: Appropriate   Cognitive: Alert and Oriented   Insight: Developing/Improving and Engaged   Engagement in Therapy: Developing/Improving and Engaged   Modes of Intervention: Clarification, Confrontation, Discussion, Education, Exploration,  Limit-setting, Orientation, Problem-solving, Rapport Building, Dance movement psychotherapisteality Testing, Socialization and Support   Summary of Progress/Problems: Pt identified obstacles faced currently and processed barriers involved in overcoming these obstacles. Pt identified steps necessary for overcoming these obstacles and explored motivation (internal and external) for facing these difficulties head on. Pt further identified one area of concern in their lives and chose a goal to focus on for today. Patient identified grief as an obstacle. Patient stated that he recently lost a family member which caused him to increase his alcohol intake. He stated that he does not comply with his medications which is something he wants to improve on so that he can better manage his depression in the future.  Chase OxfordKadijah R. Doreene Moss, LCSWA  06/25/16

## 2016-06-25 NOTE — Progress Notes (Signed)
Recreation Therapy Notes  Date: 07.03.17 Time: 1:00 pm Location: Craft Room  Group Topic: Wellness  Goal Area(s) Addresses:  Patient will identify at least one item per dimension of health. Patient will examine areas they are deficient in.  Behavioral Response: Attentive, Interactive  Intervention: 6 Dimensions of Health  Activity: Patients were given a definition sheet of the 6 dimensions of health and a worksheet with the 6 dimensions listed. Patients were instructed to write things they were currently doing in each dimension.  Education: LRT educated patients on ways to improve each dimension of health.  Education Outcome: Acknowledges education/In group clarification offered   Clinical Observations/Feedback: Patient completed activity by writing at least 1 item in each dimension. Patient contributed to group discussion by stating how this activity relates to his admission, and how this activity relates to his d/c. Patient was supportive of peer who was talking about the death of her son.  Chase Moss,Chase Moss, LRT/CTRS 06/25/2016 2:12 PM

## 2016-06-25 NOTE — BHH Suicide Risk Assessment (Signed)
[facility] INPATIENT:  Family/Significant Other Suicide Prevention Education  Suicide Prevention Education:  Patient Refusal for Family/Significant Other Suicide Prevention Education: The patient Chase Moss has refused to provide written consent for family/significant other to be provided Family/Significant Other Suicide Prevention Education during admission and/or prior to discharge.  Physician notified.  Jake SharkLaws, Ayleah Hofmeister PMSW, LCSW 06/25/2016, 4:25 PM

## 2016-06-25 NOTE — BHH Group Notes (Signed)
[facility] Group Notes:  (Nursing/MHT/Case Management/Adjunct)  Date:  06/25/2016  Time:  4:17 PM  Type of Therapy:  Psychoeducational Skills  Participation Level:  Active  Participation Quality:  Appropriate  Affect:  Appropriate  Cognitive:  Appropriate  Insight:  Appropriate  Engagement in Group:  Engaged  Modes of Intervention:  Discussion and Education  Summary of Progress/Problems:  Chase Moss Ellison 06/25/2016, 4:17 PM

## 2016-06-25 NOTE — Tx Team (Signed)
Interdisciplinary Treatment Plan Update (Adult)  Date:  06/25/2016 Time Reviewed:  4:27 PM Pt discharge goals met  1. Goal(s): Patient will participate in aftercare plan   Met: Yes  Target date: 3-5 days post admission date   As evidenced by: Patient will participate within aftercare plan AEB aftercare provider and housing plan at discharge being identified.  6/30: Pt will discharge home to [facility] to live at his motel and will follow up for medication management, substance abuse treatment and therapy at [facility].   2. Goal (s): Patient will exhibit decreased depressive symptoms and suicidal ideations.   Met: YES  Target date: 3-5 days post admission date   As evidenced by: Patient will utilize self-rating of depression at 3 or below and demonstrate decreased signs of depression or be deemed stable for discharge by MD.  6/30; Goal progressing.   3. Goal(s): Patient will demonstrate decreased signs and symptoms of anxiety.   Met: YES  Target date: 3-5 days post admission date   As evidenced by: Patient will utilize self-rating of anxiety at 3 or below and demonstrated decreased signs of anxiety, or be deemed stable for discharge by MD  6/30; Goal progressing.   4. Goal(s): Patient will demonstrate decreased signs of withdrawal due to substance abuse   Met: YES  Target date: 3-5 days post admission date   As evidenced by: Patient will produce a CIWA/COWS score of 0, have stable vitals signs, and no symptoms of withdrawal  6/30: Goal progressing.   Scribe for Treatment Team:   August Saucer, 06/25/2016, 4:27 PM MSW, LCSW

## 2016-07-10 ENCOUNTER — Encounter (HOSPITAL_COMMUNITY): Payer: Self-pay | Admitting: Emergency Medicine

## 2016-07-10 ENCOUNTER — Emergency Department (HOSPITAL_COMMUNITY)
Admission: EM | Admit: 2016-07-10 | Discharge: 2016-07-10 | Disposition: A | Discharge status: Home / Self Care | Payer: Medicaid Other | Attending: Emergency Medicine | Admitting: Emergency Medicine

## 2016-07-10 DIAGNOSIS — I10 Essential (primary) hypertension: Secondary | ICD-10-CM | POA: Insufficient documentation

## 2016-07-10 DIAGNOSIS — Z79899 Other long term (current) drug therapy: Secondary | ICD-10-CM | POA: Diagnosis not present

## 2016-07-10 DIAGNOSIS — Z87891 Personal history of nicotine dependence: Secondary | ICD-10-CM | POA: Insufficient documentation

## 2016-07-10 NOTE — ED Provider Notes (Signed)
CSN: [id number]     Arrival date & time 07/10/16  1018 History   First MD Initiated Contact with Patient 07/10/16 1020     Chief Complaint  Patient presents with  . Chest Pain     (Consider location/radiation/quality/duration/timing/severity/associated sxs/prior Treatment) HPI...Marland KitchenMarland KitchenPatient is mostly concerned about his blood pressure. No chest pain, dyspnea, diaphoresis at this time. He takes atenolol, lisinopril, Minipress.  He was recently admitted to a psychiatric facility and blames his high blood pressure on the high salt diet. He presently has no suicidal or homicidal ideation. He had a brief spell of chest pain early in the day that his dissipated.  Past Medical History  Diagnosis Date  . Hypertension   . Depression   . PTSD (post-traumatic stress disorder)   . Bipolar 1 disorder (HCC)    History reviewed. No pertinent past surgical history. Family History  Problem Relation Age of Onset  . Heart attack Other    Social History  Substance Use Topics  . Smoking status: Former Smoker -- 1.00 packs/day for 30 years    Types: Cigarettes  . Smokeless tobacco: Never Used  . Alcohol Use: No     Comment: daily alcohol    Review of Systems  All other systems reviewed and are negative.     Allergies  Review of patient's allergies indicates no known allergies.  Home Medications   Prior to Admission medications   Medication Sig Start Date End Date Taking? Authorizing Provider  atenolol (TENORMIN) 50 MG tablet TAKE 50 MG BY MOUTH DAILY 06/24/16   Shari Prows, MD  carbamazepine (TEGRETOL) 200 MG tablet Take 1 tablet (200 mg total) by mouth 2 (two) times daily after a meal. 06/24/16   Jolanta B Pucilowska, MD  lisinopril (PRINIVIL,ZESTRIL) 5 MG tablet Take 1 tablet (5 mg total) by mouth daily. 06/24/16   Shari Prows, MD  pantoprazole (PROTONIX) 40 MG tablet Take 1 tablet (40 mg total) by mouth daily. 06/24/16   Shari Prows, MD  prazosin (MINIPRESS) 2 MG  capsule Take 2 capsules (4 mg total) by mouth at bedtime. 06/24/16   Shari Prows, MD  traZODone (DESYREL) 100 MG tablet Take 1 tablet (100 mg total) by mouth at bedtime. 06/24/16   Jolanta B Pucilowska, MD   BP 152/85 mmHg  Pulse 71  Temp(Src) 98.1 F (36.7 C) (Oral)  Resp 18  SpO2 99% Physical Exam  Constitutional: He is oriented to person, place, and time. He appears well-developed and well-nourished.  HENT:  Head: Normocephalic and atraumatic.  Eyes: Conjunctivae are normal.  Neck: Neck supple.  Cardiovascular: Normal rate and regular rhythm.   Pulmonary/Chest: Effort normal and breath sounds normal.  Abdominal: Soft. Bowel sounds are normal.  Musculoskeletal: Normal range of motion.  Neurological: He is alert and oriented to person, place, and time.  Skin: Skin is warm and dry.  Psychiatric: He has a normal mood and affect. His behavior is normal.  Nursing note and vitals reviewed.   ED Course  Procedures (including critical care time) Labs Review Labs Reviewed - No data to display  Imaging Review No results found. I have personally reviewed and evaluated these images and lab results as part of my medical decision-making.   EKG Interpretation   Date/Time:  Tuesday July 10 2016 10:26:25 EDT Ventricular Rate:  72 PR Interval:  144 QRS Duration: 100 QT Interval:  392 QTC Calculation: 429 R Axis:   76 Text Interpretation:  Normal sinus rhythm Normal ECG Confirmed  by Adriana SimasOOK   MD, Jnae Thomaston ([id number]) on 07/10/2016 11:12:10 AM      MDM   Final diagnoses:  Essential hypertension    Patient is hemodynamically and psychiatrically stable. Continue current blood pressure regimen. Follow-up with primary care doctor.    Donnetta HutchingBrian Tyrisha Benninger, MD 07/10/16 1118

## 2016-07-10 NOTE — ED Notes (Signed)
Per EMS, Pt is coming from [facility]. [facility] detox at [facility]. Pt was to be discharged tomorrow. Today, pt reports having chest "thickness" for about ten minutes and facility has had trouble controlling HTN due to nutritional options. Pt denies chest pain with EMS, reports bilateral eye pressure since he was [facility] with a dull headache. Pt has suicidal hx and [place] plan [place] [address]. HX of HTN. Vitals per EMS: 74 HR, 100% on RA, 158/106. Alert and Oriented x4.

## 2016-07-10 NOTE — Discharge Instructions (Signed)
Continue your blood pressure medication. Decrease salt in your diet. Follow-up your primary care doctor.

## 2017-01-02 ENCOUNTER — Encounter (HOSPITAL_COMMUNITY): Payer: Self-pay | Admitting: Emergency Medicine

## 2017-01-02 ENCOUNTER — Emergency Department (HOSPITAL_COMMUNITY)
Admission: EM | Admit: 2017-01-02 | Discharge: 2017-01-04 | Disposition: A | Discharge status: Other Acute Inpatient Hospital | Payer: Medicaid Other | Attending: Emergency Medicine | Admitting: Emergency Medicine

## 2017-01-02 DIAGNOSIS — F332 Major depressive disorder, recurrent severe without psychotic features: Secondary | ICD-10-CM | POA: Diagnosis not present

## 2017-01-02 DIAGNOSIS — F1010 Alcohol abuse, uncomplicated: Secondary | ICD-10-CM | POA: Insufficient documentation

## 2017-01-02 DIAGNOSIS — F1110 Opioid abuse, uncomplicated: Secondary | ICD-10-CM | POA: Diagnosis not present

## 2017-01-02 DIAGNOSIS — R45851 Suicidal ideations: Secondary | ICD-10-CM | POA: Diagnosis present

## 2017-01-02 DIAGNOSIS — Z87891 Personal history of nicotine dependence: Secondary | ICD-10-CM | POA: Diagnosis not present

## 2017-01-02 DIAGNOSIS — I10 Essential (primary) hypertension: Secondary | ICD-10-CM | POA: Diagnosis not present

## 2017-01-02 DIAGNOSIS — Z79899 Other long term (current) drug therapy: Secondary | ICD-10-CM | POA: Diagnosis not present

## 2017-01-02 DIAGNOSIS — Z8249 Family history of ischemic heart disease and other diseases of the circulatory system: Secondary | ICD-10-CM | POA: Diagnosis not present

## 2017-01-02 LAB — COMPREHENSIVE METABOLIC PANEL
ALBUMIN: 4.5 g/dL (ref 3.5–5.0)
ALT: 22 U/L (ref 17–63)
AST: 25 U/L (ref 15–41)
Alkaline Phosphatase: 63 U/L (ref 38–126)
Anion gap: 8 (ref 5–15)
BILIRUBIN TOTAL: 0.7 mg/dL (ref 0.3–1.2)
BUN: 16 mg/dL (ref 6–20)
CO2: 26 mmol/L (ref 22–32)
Calcium: 9 mg/dL (ref 8.9–10.3)
Chloride: 104 mmol/L (ref 101–111)
Creatinine, Ser: 1.04 mg/dL (ref 0.61–1.24)
GFR calc Af Amer: >60 mL/min (ref >60)
GFR calc non Af Amer: >60 mL/min (ref >60)
GLUCOSE: 93 mg/dL (ref 65–99)
POTASSIUM: 4 mmol/L (ref 3.5–5.1)
Sodium: 138 mmol/L (ref 135–145)
TOTAL PROTEIN: 7.3 g/dL (ref 6.5–8.1)

## 2017-01-02 LAB — SALICYLATE LEVEL: Salicylate Lvl: <7 mg/dL (ref 2.8–30.0)

## 2017-01-02 LAB — ACETAMINOPHEN LEVEL: Acetaminophen (Tylenol), Serum: <10 ug/mL — ABNORMAL LOW (ref 10–30)

## 2017-01-02 LAB — RAPID URINE DRUG SCREEN, HOSP PERFORMED
AMPHETAMINES: NOT DETECTED
BARBITURATES: NOT DETECTED
BENZODIAZEPINES: NOT DETECTED
Cocaine: NOT DETECTED
Opiates: POSITIVE — AB
Tetrahydrocannabinol: POSITIVE — AB

## 2017-01-02 LAB — CBC
HEMATOCRIT: 47.3 % (ref 39.0–52.0)
Hemoglobin: 16.3 g/dL (ref 13.0–17.0)
MCH: 31.8 pg (ref 26.0–34.0)
MCHC: 34.5 g/dL (ref 30.0–36.0)
MCV: 92.2 fL (ref 78.0–100.0)
Platelets: 223 10*3/uL (ref 150–400)
RBC: 5.13 MIL/uL (ref 4.22–5.81)
RDW: 13.2 % (ref 11.5–15.5)
WBC: 9.4 10*3/uL (ref 4.0–10.5)

## 2017-01-02 LAB — ETHANOL: Alcohol, Ethyl (B): <5 mg/dL (ref <5)

## 2017-01-02 MED ORDER — IBUPROFEN 200 MG PO TABS: 600.0000 mg | ORAL_TABLET | Freq: Three times a day (TID) | ORAL | Status: DC | PRN

## 2017-01-02 MED ORDER — LORAZEPAM 1 MG PO TABS: 1.0000 mg | ORAL_TABLET | Freq: Three times a day (TID) | ORAL | Status: DC | PRN

## 2017-01-02 MED ORDER — ACETAMINOPHEN 325 MG PO TABS: 650.0000 mg | ORAL_TABLET | ORAL | Status: DC | PRN

## 2017-01-02 MED ORDER — ONDANSETRON HCL 4 MG PO TABS: 4.0000 mg | ORAL_TABLET | Freq: Three times a day (TID) | ORAL | Status: DC | PRN
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
## 2017-01-02 NOTE — ED Notes (Signed)
Introduced self to patient. Pt oriented to unit expectations.  Assessed pt for:  A) Anxiety &/or agitation: On admission to the [facility] pt is very talkative. He said that his sister passed away unexpectedly and he is having difficulty coping with her death. He has not been taking his prescribed medications and is drinking alcohol daily. He does not appear to be in any distress and does not report withdrawal symptoms at this time.  S) Safety: Safety maintained with q-15-minute checks and hourly rounds by staff. Pt assessed at admission by security.  A) ADLs: Pt able to perform ADLs independently.  P) Pick-Up (room cleanliness): Pt's room clean and free of clutter.

## 2017-01-02 NOTE — ED Triage Notes (Signed)
Patient reports SI with plan to "jump out in front of traffic" since death of sister on 1/8. Patient states he has been off psych meds x6 months. Denies HI and AV/H. Patient tearful in triage. Hx PTSD and depression.
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
## 2017-01-02 NOTE — ED Notes (Signed)
Patient changed into paper scrubs. Patient and belongings wanded by security. 

## 2017-01-02 NOTE — ED Notes (Signed)
Patient reports SI with a plan to jump off bridge. Patient denies HI and AVH at this time. Plan of care discussed with patient. Patient voices no complaint or concerns at this time. Encouragement and support provided and safety maintain. Q 15 min safety checks remain place and video monitoring.

## 2017-01-03 ENCOUNTER — Encounter (HOSPITAL_COMMUNITY): Payer: Self-pay | Admitting: Emergency Medicine

## 2017-01-03 DIAGNOSIS — Z8249 Family history of ischemic heart disease and other diseases of the circulatory system: Secondary | ICD-10-CM | POA: Diagnosis not present

## 2017-01-03 DIAGNOSIS — Z87891 Personal history of nicotine dependence: Secondary | ICD-10-CM | POA: Diagnosis not present

## 2017-01-03 DIAGNOSIS — F332 Major depressive disorder, recurrent severe without psychotic features: Secondary | ICD-10-CM | POA: Diagnosis not present

## 2017-01-03 DIAGNOSIS — R45851 Suicidal ideations: Secondary | ICD-10-CM

## 2017-01-03 DIAGNOSIS — F1110 Opioid abuse, uncomplicated: Secondary | ICD-10-CM

## 2017-01-03 DIAGNOSIS — F1010 Alcohol abuse, uncomplicated: Secondary | ICD-10-CM

## 2017-01-03 DIAGNOSIS — Z79899 Other long term (current) drug therapy: Secondary | ICD-10-CM | POA: Diagnosis not present

## 2017-01-03 MED ORDER — ATENOLOL 50 MG PO TABS
50.0000 mg | ORAL_TABLET | Freq: Every day | ORAL | Status: DC
  Administered 2017-01-03 – 2017-01-04 (×2): 50 mg via ORAL
  Filled 2017-01-03 (×3): qty 1

## 2017-01-03 MED ORDER — CARBAMAZEPINE 200 MG PO TABS
200.0000 mg | ORAL_TABLET | Freq: Two times a day (BID) | ORAL | Status: DC
  Administered 2017-01-03 – 2017-01-04 (×2): 200 mg via ORAL
  Filled 2017-01-03 (×2): qty 1

## 2017-01-03 MED ORDER — TRAZODONE HCL 100 MG PO TABS
100.0000 mg | ORAL_TABLET | Freq: Every day | ORAL | Status: DC
  Administered 2017-01-03: 100 mg via ORAL
  Filled 2017-01-03: qty 1

## 2017-01-03 MED ORDER — CITALOPRAM HYDROBROMIDE 10 MG PO TABS
10.0000 mg | ORAL_TABLET | Freq: Every day | ORAL | Status: DC
  Administered 2017-01-03 – 2017-01-04 (×2): 10 mg via ORAL
  Filled 2017-01-03 (×2): qty 1

## 2017-01-03 MED ORDER — LISINOPRIL 5 MG PO TABS
5.0000 mg | ORAL_TABLET | Freq: Every day | ORAL | Status: DC
  Administered 2017-01-03 – 2017-01-04 (×2): 5 mg via ORAL
  Filled 2017-01-03 (×3): qty 1

## 2017-01-03 MED ORDER — PRAZOSIN HCL 2 MG PO CAPS
4.0000 mg | ORAL_CAPSULE | Freq: Every day | ORAL | Status: DC
Start: 2017-01-03 — End: 2017-01-04
  Administered 2017-01-03: 4 mg via ORAL
  Filled 2017-01-03: qty 2

## 2017-01-03 MED ORDER — PRAVASTATIN SODIUM 40 MG PO TABS
40.0000 mg | ORAL_TABLET | Freq: Every evening | ORAL | Status: DC
  Administered 2017-01-03: 40 mg via ORAL
  Filled 2017-01-03 (×2): qty 1
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
## 2017-01-03 NOTE — Progress Notes (Signed)
01/03/17 1340:  LRT went to pt room to offer activities, pt was sleep.  Caroll RancherMarjette Irlene Crudup, LRT/CTRS

## 2017-01-03 NOTE — ED Notes (Signed)
Pt presents with sad affect, pt reports depression. Reports he has been isolating. Pt endorsing SI, pt verbally contracts for safety. Encouragement and support provided. Special checks q 15 mins in place for safety. Video monitoring in place. Will continue to monitor.

## 2017-01-03 NOTE — BH Assessment (Signed)
[facility] Assessment Progress Note  Per Mojeed Akintayo, MD, this pt requires psychiatric hospitalization at this time.  The following facilities have been contacted to seek placement for this pt, with results as noted:  Beds available, information sent, decision pending:  [facility]   At capacity:  [facility]   Lacresha Fusilier, MA Triage Specialist [phone]     

## 2017-01-03 NOTE — BH Assessment (Signed)
[facility] Assessment Progress Note   Chase Moss at [facility] said that patient had been accepted pending being IVC'ed.  If IVC'ed, accepting physician is Dr. Rex KrasVeeraindar Goli.  Can come after 12noon on 01/12.  Nurse call report number is [phone]

## 2017-01-03 NOTE — ED Notes (Signed)
Patient continues to admits to SI and denies HI and AVH. Plan of care discussed with patient. Patient voices no complaints or concerns at this time. Encouragement and support provided and safety maintain. Q 15 min safety checks remain in place and video monitoring.

## 2017-01-04 NOTE — BH Assessment (Signed)
[facility] Assessment Progress Note  As noted in note by Chase Moss, TS, pt has been accepted to [facility] by Dr Goli, conditioned upon pt being placed under IVC.  Chase Akintayo, MD, concurs with this decision, and has initiated IVC.  IVC documents have been faxed to [place] Magistrate, and at 10:45 Magistrate Hay[place] confirms receipt.  As of this writing, service of Findings and Custody Order is pending.  Pt is to be transported via [place] [facility].  Pt's nurse has been notified.  Chase Mikes, MA Triage Specialist [phone]     

## 2017-01-04 NOTE — Progress Notes (Signed)
01/04/17 1340:  LRT went to pt room to offer activities, pt was sleep.  Caroll RancherMarjette Sylvio Weatherall, LRT/CTRS

## 2017-03-30 ENCOUNTER — Emergency Department (HOSPITAL_COMMUNITY)
Admission: EM | Admit: 2017-03-30 | Discharge: 2017-04-01 | Disposition: A | Discharge status: Home / Self Care | Payer: Medicaid Other | Attending: Physician Assistant | Admitting: Physician Assistant

## 2017-03-30 ENCOUNTER — Encounter (HOSPITAL_COMMUNITY): Payer: Self-pay | Admitting: *Deleted

## 2017-03-30 DIAGNOSIS — F1410 Cocaine abuse, uncomplicated: Secondary | ICD-10-CM

## 2017-03-30 DIAGNOSIS — F1414 Cocaine abuse with cocaine-induced mood disorder: Secondary | ICD-10-CM | POA: Diagnosis present

## 2017-03-30 DIAGNOSIS — F1424 Cocaine dependence with cocaine-induced mood disorder: Secondary | ICD-10-CM | POA: Insufficient documentation

## 2017-03-30 DIAGNOSIS — I10 Essential (primary) hypertension: Secondary | ICD-10-CM | POA: Insufficient documentation

## 2017-03-30 DIAGNOSIS — F1420 Cocaine dependence, uncomplicated: Secondary | ICD-10-CM | POA: Diagnosis present

## 2017-03-30 DIAGNOSIS — Z87891 Personal history of nicotine dependence: Secondary | ICD-10-CM | POA: Insufficient documentation

## 2017-03-30 DIAGNOSIS — F314 Bipolar disorder, current episode depressed, severe, without psychotic features: Secondary | ICD-10-CM | POA: Diagnosis present

## 2017-03-30 DIAGNOSIS — F1220 Cannabis dependence, uncomplicated: Secondary | ICD-10-CM

## 2017-03-30 DIAGNOSIS — Z79899 Other long term (current) drug therapy: Secondary | ICD-10-CM | POA: Insufficient documentation

## 2017-03-30 LAB — CBC
HCT: 46 % (ref 39.0–52.0)
Hemoglobin: 15.5 g/dL (ref 13.0–17.0)
MCH: 30.3 pg (ref 26.0–34.0)
MCHC: 33.7 g/dL (ref 30.0–36.0)
MCV: 90 fL (ref 78.0–100.0)
PLATELETS: 260 10*3/uL (ref 150–400)
RBC: 5.11 MIL/uL (ref 4.22–5.81)
RDW: 13.9 % (ref 11.5–15.5)
WBC: 9.2 10*3/uL (ref 4.0–10.5)

## 2017-03-30 LAB — COMPREHENSIVE METABOLIC PANEL
ALBUMIN: 4.5 g/dL (ref 3.5–5.0)
ALK PHOS: 96 U/L (ref 38–126)
ALT: 32 U/L (ref 17–63)
ANION GAP: 6 (ref 5–15)
AST: 30 U/L (ref 15–41)
BUN: 18 mg/dL (ref 6–20)
CHLORIDE: 105 mmol/L (ref 101–111)
CO2: 27 mmol/L (ref 22–32)
Calcium: 9.1 mg/dL (ref 8.9–10.3)
Creatinine, Ser: 0.95 mg/dL (ref 0.61–1.24)
GFR calc Af Amer: >60 mL/min (ref >60)
GFR calc non Af Amer: >60 mL/min (ref >60)
Glucose, Bld: 94 mg/dL (ref 65–99)
POTASSIUM: 3.6 mmol/L (ref 3.5–5.1)
SODIUM: 138 mmol/L (ref 135–145)
Total Bilirubin: 0.6 mg/dL (ref 0.3–1.2)
Total Protein: 8 g/dL (ref 6.5–8.1)

## 2017-03-30 LAB — SALICYLATE LEVEL: Salicylate Lvl: <7 mg/dL (ref 2.8–30.0)

## 2017-03-30 LAB — RAPID URINE DRUG SCREEN, HOSP PERFORMED
Amphetamines: NOT DETECTED
BENZODIAZEPINES: NOT DETECTED
Barbiturates: NOT DETECTED
Cocaine: POSITIVE — AB
Opiates: NOT DETECTED
Tetrahydrocannabinol: POSITIVE — AB

## 2017-03-30 LAB — ETHANOL

## 2017-03-30 LAB — ACETAMINOPHEN LEVEL: Acetaminophen (Tylenol), Serum: <10 ug/mL — ABNORMAL LOW (ref 10–30)

## 2017-03-30 MED ORDER — LORAZEPAM 1 MG PO TABS: 1.0000 mg | ORAL_TABLET | Freq: Three times a day (TID) | ORAL | Status: DC | PRN

## 2017-03-30 MED ORDER — ONDANSETRON HCL 4 MG PO TABS: 4.0000 mg | ORAL_TABLET | Freq: Three times a day (TID) | ORAL | Status: DC | PRN

## 2017-03-30 MED ORDER — ACETAMINOPHEN 325 MG PO TABS: 650.0000 mg | ORAL_TABLET | ORAL | Status: DC | PRN

## 2017-03-30 NOTE — ED Notes (Signed)
Bed: [facility] Expected date:  Expected time:  Means of arrival:  Comments: 

## 2017-03-30 NOTE — ED Triage Notes (Signed)
Pt reports suicidal and homicidal thoughts. Pt states he has homicidal thoughts towards his brother, states "I'm thinking about killing him and killing me". Pt states he went off his regular psychiatric medications 2 weeks ago.
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
## 2017-03-30 NOTE — ED Notes (Signed)
Patient states he needs "medication adjustment". Stressed over family discord. HI towards brother-in-law who is taking his disability check. 15' checks initiated.

## 2017-03-30 NOTE — ED Notes (Signed)
Bed: [facility] Expected date:  Expected time:  Means of arrival:  Comments: 

## 2017-03-30 NOTE — ED Notes (Signed)
SBAR Report received from previous nurse. Pt received calm and visible on unit. Pt denies current SI/ HI, A/V H, depression, anxiety, or pain at this time, and appears otherwise stable and free of distress. Pt reminded of camera surveillance, q 15 min rounds, and rules of the milieu. Will continue to assess. 

## 2017-03-31 DIAGNOSIS — Z79899 Other long term (current) drug therapy: Secondary | ICD-10-CM

## 2017-03-31 DIAGNOSIS — Z87891 Personal history of nicotine dependence: Secondary | ICD-10-CM

## 2017-03-31 DIAGNOSIS — F1420 Cocaine dependence, uncomplicated: Secondary | ICD-10-CM | POA: Diagnosis present

## 2017-03-31 DIAGNOSIS — F314 Bipolar disorder, current episode depressed, severe, without psychotic features: Secondary | ICD-10-CM | POA: Diagnosis present

## 2017-03-31 DIAGNOSIS — F1414 Cocaine abuse with cocaine-induced mood disorder: Secondary | ICD-10-CM

## 2017-03-31 MED ORDER — CITALOPRAM HYDROBROMIDE 10 MG PO TABS
10.0000 mg | ORAL_TABLET | Freq: Every day | ORAL | Status: DC
  Administered 2017-03-31 – 2017-04-01 (×2): 10 mg via ORAL
  Filled 2017-03-31 (×2): qty 1

## 2017-03-31 MED ORDER — HYDROXYZINE HCL 25 MG PO TABS
25.0000 mg | ORAL_TABLET | Freq: Every day | ORAL | Status: DC
  Administered 2017-03-31: 25 mg via ORAL
  Filled 2017-03-31: qty 1

## 2017-03-31 MED ORDER — PRAZOSIN HCL 2 MG PO CAPS: 4.0000 mg | ORAL_CAPSULE | Freq: Every day | ORAL | Status: DC

## 2017-03-31 MED ORDER — ATENOLOL 50 MG PO TABS
50.0000 mg | ORAL_TABLET | Freq: Every day | ORAL | Status: DC
  Administered 2017-03-31 – 2017-04-01 (×2): 50 mg via ORAL
  Filled 2017-03-31 (×2): qty 1

## 2017-03-31 MED ORDER — ARIPIPRAZOLE 10 MG PO TABS
10.0000 mg | ORAL_TABLET | Freq: Every day | ORAL | Status: DC
  Administered 2017-03-31 – 2017-04-01 (×2): 10 mg via ORAL
  Filled 2017-03-31 (×2): qty 1

## 2017-03-31 MED ORDER — LISINOPRIL 5 MG PO TABS
5.0000 mg | ORAL_TABLET | Freq: Every day | ORAL | Status: DC
  Administered 2017-03-31 – 2017-04-01 (×2): 5 mg via ORAL
  Filled 2017-03-31 (×2): qty 1

## 2017-03-31 MED ORDER — TRAZODONE HCL 100 MG PO TABS
100.0000 mg | ORAL_TABLET | Freq: Every day | ORAL | Status: DC
  Administered 2017-03-31: 100 mg via ORAL
  Filled 2017-03-31: qty 1

## 2017-03-31 MED ORDER — PRAZOSIN HCL 2 MG PO CAPS
2.0000 mg | ORAL_CAPSULE | Freq: Every day | ORAL | Status: DC
  Administered 2017-03-31: 2 mg via ORAL
  Filled 2017-03-31: qty 1

## 2017-03-31 MED ORDER — CARBAMAZEPINE 200 MG PO TABS
200.0000 mg | ORAL_TABLET | Freq: Two times a day (BID) | ORAL | Status: DC
  Administered 2017-03-31 – 2017-04-01 (×2): 200 mg via ORAL
  Filled 2017-03-31 (×2): qty 1

## 2017-03-31 MED ORDER — HYDROXYZINE PAMOATE 25 MG PO CAPS
25.0000 mg | ORAL_CAPSULE | Freq: Every day | ORAL | Status: DC
  Filled 2017-03-31: qty 1

## 2017-03-31 MED ORDER — PRAVASTATIN SODIUM 40 MG PO TABS
40.0000 mg | ORAL_TABLET | Freq: Every evening | ORAL | Status: DC
  Administered 2017-03-31: 40 mg via ORAL
  Filled 2017-03-31 (×2): qty 1

## 2017-03-31 MED ORDER — PANTOPRAZOLE SODIUM 40 MG PO TBEC
40.0000 mg | DELAYED_RELEASE_TABLET | Freq: Every day | ORAL | Status: DC
  Administered 2017-03-31 – 2017-04-01 (×2): 40 mg via ORAL
  Filled 2017-03-31 (×2): qty 1

## 2017-03-31 NOTE — Progress Notes (Signed)
CSW faxed patient's referral to: [facility]. [facility], [facility], [facility] and [facility].  Celso Sickle, LCSWA [facility]  Clinical Social Worker [phone]

## 2017-03-31 NOTE — ED Notes (Signed)
Update patient on plan of care.  He has been quiet and cooperative.  Support offred. 15' checks cont for sfatey.

## 2017-03-31 NOTE — Consult Note (Signed)
[facility] Consult   Reason for Consult:  Suicidal Referring Physician:  Falyn Rubel Patient Identification: Chase Moss MRN:  [id number] Principal Diagnosis: Bipolar 1 disorder, depressed, severe ([place]) Diagnosis:   Patient Active Problem List   Diagnosis Date Noted  . Bipolar 1 disorder, depressed, severe ([place]) [F31.4] 03/31/2017    Priority: High  . Cocaine abuse [F14.10] 03/31/2017    Priority: High  . Opiate abuse, episodic [F11.10] 01/03/2017    Priority: High  . Alcohol abuse [F10.10]   . HTN (hypertension) [I10] 06/20/2016  . Tobacco use disorder [F17.200] 06/20/2016  . Trauma [T14.90XA] 07/27/2015  . Cannabis use disorder, moderate, dependence ([place]) [F12.20]   . PTSD (post-traumatic stress disorder) [F43.10]   . Substance induced mood disorder ([place]) [F19.94] 07/07/2014  . Alcohol use disorder, moderate, dependence ([place]) [F10.20] 04/10/2012  . Suicidal thoughts [R45.851] 04/04/2012    Total Time spent with patient: 45 minutes  Subjective:   Chase Moss is a 51 y.o. male patient will be observed for 24 hours and discharged tomorrow for outpatient treatment.  HPI: Patient states he became upset with his brother-in-law, who he believes is taking his monthly checks since his sister's passing in January. Pt's sister was his payee and she was also his main support. Pt with history of bipolar depression as well as substance abuse. Patient states his depression has increased recently since her passing as well as with the loss of his mother 2 years ago. He states he has been unable to get his medications for two weeks and has since been using cocaine and marijuana several times per week to cope. Pt states he last went to [facility] in November of 2017 and was also seen at [facility] in January. Pt recently served time in jail for possion of stolen property.   Past Psychiatric History: As above  Risk to Self: Suicidal Ideation: Yes-Currently Present Suicidal Intent:  Yes-Currently Present Is patient at risk for suicide?: Yes Suicidal Plan?: Yes-Currently Present Specify Current Suicidal Plan: pt reports a plan to commit suicide by police or jump off of a bridge  Access to Means: Yes Specify Access to Suicidal Means: pt has access to police officers and bridges What has been your use of drugs/alcohol within the last 12 months?: reports to cocaine and marijuana use on 03/29/17 How many times?: 1 Triggers for Past Attempts: Unpredictable Intentional Self Injurious Behavior: Cutting Comment - Self Injurious Behavior: pt reports he has cut himself as a teenager and burned himself with cigarettes  Risk to Others: Homicidal Ideation: Yes-Currently Present Thoughts of Harm to Others: Yes-Currently Present Comment - Thoughts of Harm to Others: pt reports he wants to kill his brother in law Current Homicidal Intent: No Current Homicidal Plan: No Access to Homicidal Means: No (pt is currently in the ED) Identified Victim: pt's brother in law "Richard" History of harm to others?: No Assessment of Violence: None Noted Does patient have access to weapons?: Yes (Comment) (pt reports there is a shotgun in the home ) Criminal Charges Pending?: No Does patient have a court date: No Prior Inpatient Therapy: Prior Inpatient Therapy: Yes Prior Therapy Dates: 2018, 2017, 2016, 2015 Prior Therapy Facilty/Provider(s): [facility], [facility], [facility]  Reason for Treatment: MDD, SI Prior Outpatient Therapy: Prior Outpatient Therapy: No Does patient have an ACCT team?: No Does patient have Intensive In-House Services?  : No Does patient have [facility] services? : No Does patient have P4CC services?: No  Past Medical History:  Past Medical History:  Diagnosis Date  .  Bipolar 1 disorder ([place])   . Depression   . Hypertension   . PTSD (post-traumatic stress disorder)    History reviewed. No pertinent surgical history. Family History:  Family History  Problem Relation Age of  Onset  . Heart attack Other    Family Psychiatric  History: None reported  Social History:  History  Alcohol Use No    Comment: daily alcohol     History  Drug Use No    Comment: several times a day- pt reports hasn't used since detoxic     Social History   Social History  . Marital status: Divorced    Spouse name: N/A  . Number of children: N/A  . Years of education: N/A   Social History Main Topics  . Smoking status: Former Smoker    Packs/day: 1.00    Years: 30.00    Types: Cigarettes  . Smokeless tobacco: Never Used  . Alcohol use No     Comment: daily alcohol  . Drug use: No     Comment: several times a day- pt reports hasn't used since detoxic   . Sexual activity: Not Asked   Other Topics Concern  . None   Social History Narrative  . None   Additional Social History:    Allergies:  No Known Allergies  Labs:  Results for orders placed or performed during the hospital encounter of 03/30/17 (from the past 48 hour(s))  Rapid urine drug screen (hospital performed)     Status: Abnormal   Collection Time: 03/30/17  4:05 PM  Result Value Ref Range   Opiates NONE DETECTED NONE DETECTED   Cocaine POSITIVE (A) NONE DETECTED   Benzodiazepines NONE DETECTED NONE DETECTED   Amphetamines NONE DETECTED NONE DETECTED   Tetrahydrocannabinol POSITIVE (A) NONE DETECTED   Barbiturates NONE DETECTED NONE DETECTED    Comment:        DRUG SCREEN FOR MEDICAL PURPOSES ONLY.  IF CONFIRMATION IS NEEDED FOR ANY PURPOSE, NOTIFY LAB WITHIN 5 DAYS.        LOWEST DETECTABLE LIMITS FOR URINE DRUG SCREEN Drug Class       Cutoff (ng/mL) Amphetamine      1000 Barbiturate      200 Benzodiazepine   235 Tricyclics       573 Opiates          300 Cocaine          300 THC              50   Ethanol     Status: None   Collection Time: 03/30/17  4:13 PM  Result Value Ref Range   Alcohol, Ethyl (B) <5 <5 mg/dL    Comment:        LOWEST DETECTABLE LIMIT FOR SERUM ALCOHOL IS 5  mg/dL FOR MEDICAL PURPOSES ONLY   Salicylate level     Status: None   Collection Time: 03/30/17  4:13 PM  Result Value Ref Range   Salicylate Lvl <2.2 2.8 - 30.0 mg/dL  Acetaminophen level     Status: Abnormal   Collection Time: 03/30/17  4:13 PM  Result Value Ref Range   Acetaminophen (Tylenol), Serum <10 (L) 10 - 30 ug/mL    Comment:        THERAPEUTIC CONCENTRATIONS VARY SIGNIFICANTLY. A RANGE OF 10-30 ug/mL MAY BE AN EFFECTIVE CONCENTRATION FOR MANY PATIENTS. HOWEVER, SOME ARE BEST TREATED AT CONCENTRATIONS OUTSIDE THIS RANGE. ACETAMINOPHEN CONCENTRATIONS >150 ug/mL AT 4 HOURS AFTER INGESTION AND >50  ug/mL AT 12 HOURS AFTER INGESTION ARE OFTEN ASSOCIATED WITH TOXIC REACTIONS.   cbc     Status: None   Collection Time: 03/30/17  4:13 PM  Result Value Ref Range   WBC 9.2 4.0 - 10.5 K/uL   RBC 5.11 4.22 - 5.81 MIL/uL   Hemoglobin 15.5 13.0 - 17.0 g/dL   HCT 46.0 39.0 - 52.0 %   MCV 90.0 78.0 - 100.0 fL   MCH 30.3 26.0 - 34.0 pg   MCHC 33.7 30.0 - 36.0 g/dL   RDW 13.9 11.5 - 15.5 %   Platelets 260 150 - 400 K/uL  Comprehensive metabolic panel     Status: None   Collection Time: 03/30/17  4:13 PM  Result Value Ref Range   Sodium 138 135 - 145 mmol/L   Potassium 3.6 3.5 - 5.1 mmol/L   Chloride 105 101 - 111 mmol/L   CO2 27 22 - 32 mmol/L   Glucose, Bld 94 65 - 99 mg/dL   BUN 18 6 - 20 mg/dL   Creatinine, Ser 0.95 0.61 - 1.24 mg/dL   Calcium 9.1 8.9 - 10.3 mg/dL   Total Protein 8.0 6.5 - 8.1 g/dL   Albumin 4.5 3.5 - 5.0 g/dL   AST 30 15 - 41 U/L   ALT 32 17 - 63 U/L   Alkaline Phosphatase 96 38 - 126 U/L   Total Bilirubin 0.6 0.3 - 1.2 mg/dL   GFR calc non Af Amer >60 >60 mL/min   GFR calc Af Amer >60 >60 mL/min    Comment: (NOTE) The eGFR has been calculated using the CKD EPI equation. This calculation has not been validated in all clinical situations. eGFR's persistently <60 mL/min signify possible Chronic Kidney Disease.    Anion gap 6 5 - 15     Current Facility-Administered Medications  Medication Dose Route Frequency Provider Last Rate Last Dose  . acetaminophen (TYLENOL) tablet 650 mg  650 mg Oral Q4H PRN Courteney Lyn Mackuen, MD      . ondansetron (ZOFRAN) tablet 4 mg  4 mg Oral Q8H PRN Courteney Lyn Mackuen, MD       Current Outpatient Prescriptions  Medication Sig Dispense Refill  . ARIPiprazole (ABILIFY) 5 MG tablet Take 10 mg by mouth daily.    Marland Kitchen atenolol (TENORMIN) 50 MG tablet TAKE 50 MG BY MOUTH DAILY 30 tablet 0  . buPROPion (WELLBUTRIN XL) 150 MG 24 hr tablet Take 150 mg by mouth daily.  0  . carbamazepine (TEGRETOL) 200 MG tablet Take 1 tablet (200 mg total) by mouth 2 (two) times daily after a meal. 60 tablet 0  . hydrOXYzine (VISTARIL) 25 MG capsule Take 25 mg by mouth at bedtime.  0  . lisinopril (PRINIVIL,ZESTRIL) 5 MG tablet Take 1 tablet (5 mg total) by mouth daily. 30 tablet 0  . pantoprazole (PROTONIX) 40 MG tablet Take 1 tablet (40 mg total) by mouth daily. 30 tablet 0  . pravastatin (PRAVACHOL) 40 MG tablet Take 40 mg by mouth every evening.    . prazosin (MINIPRESS) 5 MG capsule Take 5 mg by mouth at bedtime.  0  . traZODone (DESYREL) 100 MG tablet Take 1 tablet (100 mg total) by mouth at bedtime. 30 tablet 0  . zolpidem (AMBIEN) 10 MG tablet Take 10 mg by mouth at bedtime as needed for sleep.    . prazosin (MINIPRESS) 2 MG capsule Take 2 capsules (4 mg total) by mouth at bedtime. (Patient not taking: Reported on 03/30/2017)  60 capsule 0    Musculoskeletal: Strength & Muscle Tone: within normal limits Gait & Station: normal Patient leans: N/A  Psychiatric Specialty Exam: Physical Exam  Constitutional: He appears well-developed.  HENT:  Head: Normocephalic.  Eyes: Pupils are equal, round, and reactive to light.  Neck: Normal range of motion.  Cardiovascular: Normal rate.   Respiratory: Effort normal.  GI: Soft. Bowel sounds are normal.  Musculoskeletal: Normal range of motion.  Neurological:  He is alert.  Skin: Skin is warm and dry.  Psychiatric: His speech is normal. Judgment and thought content normal. Cognition and memory are normal. He exhibits a depressed mood.    Review of Systems  Constitutional: Negative.   HENT: Negative.   Eyes: Negative.   Respiratory: Negative.   Cardiovascular: Negative.   Gastrointestinal: Negative.   Genitourinary: Negative.   Musculoskeletal: Negative.   Skin: Negative.   Neurological: Negative.   Endo/Heme/Allergies: Negative.   Psychiatric/Behavioral: Positive for depression and substance abuse.    Blood pressure 116/73, pulse (!) 57, temperature 98 F (36.7 C), temperature source Oral, resp. rate 16, SpO2 99 %.There is no height or weight on file to calculate BMI.  General Appearance: Disheveled  Eye Contact:  Fair  Speech:  Clear and Coherent  Volume:  Normal  Mood:  Depressed  Affect:  Blunt and Depressed  Thought Process:  Coherent  Orientation:  Full (Time, Place, and Person)  Thought Content:  WDL  Suicidal Thoughts:  No  Homicidal Thoughts:  No  Memory:  Immediate;   Good Recent;   Good Remote;   Good  Judgement:  Impaired  Insight:  Fair  Psychomotor Activity:  Normal  Concentration:  Concentration: Good and Attention Span: Good  Recall:  Good  Fund of Knowledge:  Fair  Language:  Good  Akathisia:  No  Handed:  Right  AIMS (if indicated):     Assets:  Communication Skills Physical Health  ADL's:  Intact  Cognition:  WNL  Sleep:        Treatment Plan Summary: Treatment of Bipolar depression severe, cocaine abuse: Daily contact with patient to assess and evaluate symptoms and progress in treatment, Medication management and Plan inpatient admission. Bipolar affective disorder, depressed, severe without psychosis  Medical medications continued, along with Abilify 10 mg daily for mood stabilization, Tegretol 200 mg BID mood stabilization, Vistaril 25 mg QHS for anxiety, Minipress 5 mg QHS nightmares decreased to  2 mg, Trazodone 100 mg QHS sleep.  Start Celexa 10 mg daily for depression  Individual and substance abuse counseling  Disposition: Observe for 24 hours  Waylan Boga, NP 03/31/2017 11:06 AM  Patient seen face-to-face for psychiatric evaluation, chart reviewed and case discussed with the physician extender and developed treatment plan. Reviewed the information documented and agree with the treatment plan. Corena Pilgrim, MD

## 2017-04-01 DIAGNOSIS — F1414 Cocaine abuse with cocaine-induced mood disorder: Secondary | ICD-10-CM | POA: Diagnosis present

## 2017-04-01 MED ORDER — ARIPIPRAZOLE 5 MG PO TABS: 10.0000 mg | ORAL_TABLET | Freq: Every day | ORAL | 0 refills | Status: DC

## 2017-04-01 MED ORDER — HYDROXYZINE PAMOATE 25 MG PO CAPS: 25.0000 mg | ORAL_CAPSULE | Freq: Every day | ORAL | 0 refills | Status: DC

## 2017-04-01 MED ORDER — CITALOPRAM HYDROBROMIDE 10 MG PO TABS: 10.0000 mg | ORAL_TABLET | Freq: Every day | ORAL | 0 refills | Status: DC

## 2017-04-01 MED ORDER — TRAZODONE HCL 100 MG PO TABS: 100.0000 mg | ORAL_TABLET | Freq: Every day | ORAL | 0 refills | Status: DC

## 2017-04-01 MED ORDER — CARBAMAZEPINE 200 MG PO TABS: 200.0000 mg | ORAL_TABLET | Freq: Two times a day (BID) | ORAL | 0 refills | Status: DC

## 2017-04-01 NOTE — Consult Note (Signed)
[facility] Psychiatry Consult   Reason for Consult:  Cocaine abuse with suicidal ideations Referring Physician:  EDP Patient Identification: Chase Moss MRN:  [id number] Principal Diagnosis: Cocaine abuse with cocaine-induced mood disorder [facility]) Diagnosis:   Patient Active Problem List   Diagnosis Date Noted  . Cocaine abuse with cocaine-induced mood disorder ([place]) [F14.14] 04/01/2017    Priority: High  . Cocaine abuse [F14.10] 03/31/2017    Priority: High  . Opiate abuse, episodic [F11.10] 01/03/2017    Priority: High  . Alcohol abuse [F10.10]   . HTN (hypertension) [I10] 06/20/2016  . Tobacco use disorder [F17.200] 06/20/2016  . Trauma [T14.90XA] 07/27/2015  . Cannabis use disorder, moderate, dependence ([place]) [F12.20]   . PTSD (post-traumatic stress disorder) [F43.10]   . Substance induced mood disorder ([place]) [F19.94] 07/07/2014  . Alcohol use disorder, moderate, dependence ([place]) [F10.20] 04/10/2012  . Suicidal thoughts [R45.851] 04/04/2012    Total Time spent with patient: 45 minutes  Subjective:   Chase Moss is a 51 y.o. male patient does not warrant admission.  HPI:  51 yo male who presented to the ED after using cocaine and having suicidal/homicidal ideations.  Clear and coherent on assessment with no suicidal/homicidal ideations.  Kept over night to assure stabilization.  No suicidal/homicidal ideations, hallucinations, or withdrawal symptoms noted.  He was in the psychiatric hospital in January prior to jail, left there and used cocaine and came here.  Restarted on his medications and stabilized  Past Psychiatric History: depression, substance abuse  Risk to Self: None Risk to Others: None Prior Inpatient Therapy: Prior Inpatient Therapy: Yes Prior Therapy Dates: 2018, 2017, 2016, 2015 Prior Therapy Facilty/Provider(s): [place], [facility], [facility], [facility]  Reason for Treatment: MDD, SI Prior Outpatient Therapy: Prior Outpatient Therapy: No Does patient have an ACCT  team?: No Does patient have Intensive In-House Services?  : No Does patient have [facility] services? : No Does patient have P4CC services?: No  Past Medical History:  Past Medical History:  Diagnosis Date  . Bipolar 1 disorder ([place])   . Depression   . Hypertension   . PTSD (post-traumatic stress disorder)    History reviewed. No pertinent surgical history. Family History:  Family History  Problem Relation Age of Onset  . Heart attack Other    Family Psychiatric  History: none Social History:  History  Alcohol Use No    Comment: daily alcohol     History  Drug Use No    Comment: several times a day- pt reports hasn't used since detoxic     Social History   Social History  . Marital status: Divorced    Spouse name: N/A  . Number of children: N/A  . Years of education: N/A   Social History Main Topics  . Smoking status: Former Smoker    Packs/day: 1.00    Years: 30.00    Types: Cigarettes  . Smokeless tobacco: Never Used  . Alcohol use No     Comment: daily alcohol  . Drug use: No     Comment: several times a day- pt reports hasn't used since detoxic   . Sexual activity: Not Asked   Other Topics Concern  . None   Social History Narrative  . None   Additional Social History:    Allergies:  No Known Allergies  Labs:  Results for orders placed or performed during the hospital encounter of 03/30/17 (from the past 48 hour(s))  Rapid urine drug screen (hospital performed)     Status: Abnormal  Collection Time: 03/30/17  4:05 PM  Result Value Ref Range   Opiates NONE DETECTED NONE DETECTED   Cocaine POSITIVE (A) NONE DETECTED   Benzodiazepines NONE DETECTED NONE DETECTED   Amphetamines NONE DETECTED NONE DETECTED   Tetrahydrocannabinol POSITIVE (A) NONE DETECTED   Barbiturates NONE DETECTED NONE DETECTED    Comment:        DRUG SCREEN FOR MEDICAL PURPOSES ONLY.  IF CONFIRMATION IS NEEDED FOR ANY PURPOSE, NOTIFY LAB WITHIN 5 DAYS.        LOWEST  DETECTABLE LIMITS FOR URINE DRUG SCREEN Drug Class       Cutoff (ng/mL) Amphetamine      1000 Barbiturate      200 Benzodiazepine   382 Tricyclics       505 Opiates          300 Cocaine          300 THC              50   Ethanol     Status: None   Collection Time: 03/30/17  4:13 PM  Result Value Ref Range   Alcohol, Ethyl (B) <5 <5 mg/dL    Comment:        LOWEST DETECTABLE LIMIT FOR SERUM ALCOHOL IS 5 mg/dL FOR MEDICAL PURPOSES ONLY   Salicylate level     Status: None   Collection Time: 03/30/17  4:13 PM  Result Value Ref Range   Salicylate Lvl <3.9 2.8 - 30.0 mg/dL  Acetaminophen level     Status: Abnormal   Collection Time: 03/30/17  4:13 PM  Result Value Ref Range   Acetaminophen (Tylenol), Serum <10 (L) 10 - 30 ug/mL    Comment:        THERAPEUTIC CONCENTRATIONS VARY SIGNIFICANTLY. A RANGE OF 10-30 ug/mL MAY BE AN EFFECTIVE CONCENTRATION FOR MANY PATIENTS. HOWEVER, SOME ARE BEST TREATED AT CONCENTRATIONS OUTSIDE THIS RANGE. ACETAMINOPHEN CONCENTRATIONS >150 ug/mL AT 4 HOURS AFTER INGESTION AND >50 ug/mL AT 12 HOURS AFTER INGESTION ARE OFTEN ASSOCIATED WITH TOXIC REACTIONS.   cbc     Status: None   Collection Time: 03/30/17  4:13 PM  Result Value Ref Range   WBC 9.2 4.0 - 10.5 K/uL   RBC 5.11 4.22 - 5.81 MIL/uL   Hemoglobin 15.5 13.0 - 17.0 g/dL   HCT 46.0 39.0 - 52.0 %   MCV 90.0 78.0 - 100.0 fL   MCH 30.3 26.0 - 34.0 pg   MCHC 33.7 30.0 - 36.0 g/dL   RDW 13.9 11.5 - 15.5 %   Platelets 260 150 - 400 K/uL  Comprehensive metabolic panel     Status: None   Collection Time: 03/30/17  4:13 PM  Result Value Ref Range   Sodium 138 135 - 145 mmol/L   Potassium 3.6 3.5 - 5.1 mmol/L   Chloride 105 101 - 111 mmol/L   CO2 27 22 - 32 mmol/L   Glucose, Bld 94 65 - 99 mg/dL   BUN 18 6 - 20 mg/dL   Creatinine, Ser 0.95 0.61 - 1.24 mg/dL   Calcium 9.1 8.9 - 10.3 mg/dL   Total Protein 8.0 6.5 - 8.1 g/dL   Albumin 4.5 3.5 - 5.0 g/dL   AST 30 15 - 41 U/L   ALT 32  17 - 63 U/L   Alkaline Phosphatase 96 38 - 126 U/L   Total Bilirubin 0.6 0.3 - 1.2 mg/dL   GFR calc non Af Amer >60 >60 mL/min   GFR calc  Af Amer >60 >60 mL/min    Comment: (NOTE) The eGFR has been calculated using the CKD EPI equation. This calculation has not been validated in all clinical situations. eGFR's persistently <60 mL/min signify possible Chronic Kidney Disease.    Anion gap 6 5 - 15    Current Facility-Administered Medications  Medication Dose Route Frequency Provider Last Rate Last Dose  . acetaminophen (TYLENOL) tablet 650 mg  650 mg Oral Q4H PRN Courteney Lyn Mackuen, MD      . ARIPiprazole (ABILIFY) tablet 10 mg  10 mg Oral Daily Patrecia Pour, NP   10 mg at 03/31/17 1439  . atenolol (TENORMIN) tablet 50 mg  50 mg Oral Daily Patrecia Pour, NP   50 mg at 03/31/17 1439  . carbamazepine (TEGRETOL) tablet 200 mg  200 mg Oral BID PC Patrecia Pour, NP   200 mg at 03/31/17 1814  . citalopram (CELEXA) tablet 10 mg  10 mg Oral Daily Patrecia Pour, NP   10 mg at 03/31/17 1439  . hydrOXYzine (ATARAX/VISTARIL) tablet 25 mg  25 mg Oral QHS Corena Pilgrim, MD   25 mg at 03/31/17 2126  . lisinopril (PRINIVIL,ZESTRIL) tablet 5 mg  5 mg Oral Daily Patrecia Pour, NP   5 mg at 03/31/17 1440  . ondansetron (ZOFRAN) tablet 4 mg  4 mg Oral Q8H PRN Courteney Lyn Mackuen, MD      . pantoprazole (PROTONIX) EC tablet 40 mg  40 mg Oral Daily Patrecia Pour, NP   40 mg at 03/31/17 1446  . pravastatin (PRAVACHOL) tablet 40 mg  40 mg Oral QPM Patrecia Pour, NP   40 mg at 03/31/17 1815  . prazosin (MINIPRESS) capsule 2 mg  2 mg Oral QHS Patrecia Pour, NP   2 mg at 03/31/17 2125  . traZODone (DESYREL) tablet 100 mg  100 mg Oral QHS Patrecia Pour, NP   100 mg at 03/31/17 2126   Current Outpatient Prescriptions  Medication Sig Dispense Refill  . ARIPiprazole (ABILIFY) 5 MG tablet Take 10 mg by mouth daily.    Marland Kitchen atenolol (TENORMIN) 50 MG tablet TAKE 50 MG BY MOUTH DAILY 30 tablet 0  .  carbamazepine (TEGRETOL) 200 MG tablet Take 1 tablet (200 mg total) by mouth 2 (two) times daily after a meal. 60 tablet 0  . hydrOXYzine (VISTARIL) 25 MG capsule Take 25 mg by mouth at bedtime.  0  . lisinopril (PRINIVIL,ZESTRIL) 5 MG tablet Take 1 tablet (5 mg total) by mouth daily. 30 tablet 0  . pantoprazole (PROTONIX) 40 MG tablet Take 1 tablet (40 mg total) by mouth daily. 30 tablet 0  . pravastatin (PRAVACHOL) 40 MG tablet Take 40 mg by mouth every evening.    . prazosin (MINIPRESS) 5 MG capsule Take 5 mg by mouth at bedtime.  0  . traZODone (DESYREL) 100 MG tablet Take 1 tablet (100 mg total) by mouth at bedtime. 30 tablet 0  . zolpidem (AMBIEN) 10 MG tablet Take 10 mg by mouth at bedtime as needed for sleep.    . prazosin (MINIPRESS) 2 MG capsule Take 2 capsules (4 mg total) by mouth at bedtime. (Patient not taking: Reported on 03/30/2017) 60 capsule 0    Musculoskeletal: Strength & Muscle Tone: within normal limits Gait & Station: normal Patient leans: N/A  Psychiatric Specialty Exam: Physical Exam  Constitutional: He is oriented to person, place, and time. He appears well-developed and well-nourished.  HENT:  Head: Normocephalic.  Neck: Normal range of motion.  Respiratory: Effort normal.  Musculoskeletal: Normal range of motion.  Neurological: He is alert and oriented to person, place, and time.  Psychiatric: His speech is normal and behavior is normal. Judgment and thought content normal. Cognition and memory are normal. He exhibits a depressed mood.    Review of Systems  Psychiatric/Behavioral: Positive for depression and substance abuse.  All other systems reviewed and are negative.   Blood pressure 110/64, pulse 63, temperature 98.1 F (36.7 C), temperature source Oral, resp. rate 16, SpO2 99 %.There is no height or weight on file to calculate BMI.  General Appearance: Casual  Eye Contact:  Good  Speech:  Normal Rate  Volume:  Normal  Mood:  Depressed, mild   Affect:  Congruent  Thought Process:  Coherent and Descriptions of Associations: Intact  Orientation:  Full (Time, Place, and Person)  Thought Content:  WDL and Logical  Suicidal Thoughts:  No  Homicidal Thoughts:  No  Memory:  Immediate;   Good Recent;   Good Remote;   Good  Judgement:  Fair  Insight:  Fair  Psychomotor Activity:  Normal  Concentration:  Concentration: Good and Attention Span: Good  Recall:  Good  Fund of Knowledge:  Fair  Language:  Good  Akathisia:  No  Handed:  Right  AIMS (if indicated):     Assets:  Leisure Time Physical Health Resilience  ADL's:  Intact  Cognition:  WNL  Sleep:        Treatment Plan Summary: Daily contact with patient to assess and evaluate symptoms and progress in treatment, Medication management and Plan cocaine abuse with cocaine induced mood disorder:  -Crisis stabilization -Medication management:  Restarted medical medications along with Celexa 10 mg daily for depression, Tegretol 200 mg BID for mood stabilization, Abilify 10 mg daily for mood stabilization, Prazosin 2 mg at bedtime for nightmares, Trazodone 100 mg at bedtime for sleep, and Vistaril 25 mg at bedtime for anxiety -Individual counseling  Disposition: No evidence of imminent risk to self or others at present.    Waylan Boga, NP 04/01/2017 9:34 AM  Patient seen face-to-face for psychiatric evaluation, chart reviewed and case discussed with the physician extender and developed treatment plan. Reviewed the information documented and agree with the treatment plan. Corena Pilgrim, MD

## 2017-04-01 NOTE — Discharge Instructions (Signed)
To help you maintain a sober lifestyle, a substance abuse treatment program may be beneficial to you.  Contact [facility] at your earliest opportunity to ask about enrolling in their program: ° °     [facility] °     [address], [address] °     [place], [place] [address] °     [phone] °     New patients are seen at the walk-in clinic every Tuesday from 9:00 am - 12:00 pm. °

## 2017-04-01 NOTE — BH Assessment (Signed)
[facility] Assessment Progress Note  Per Thedore Mins, MD, this pt does not require psychiatric hospitalization at this time.  Pt is to be discharged from [facility] with referral information for [facility].  This has been included in pt's discharge instructions.  Pt's nurse, Morrie Sheldon, has been notified.  Doylene Canning, MA Triage Specialist [phone]

## 2017-04-01 NOTE — ED Notes (Signed)
Pt became irate when informed he was being discharged.

## 2017-04-01 NOTE — ED Notes (Signed)
Pt refused to let [facility] obtained his discharge vitals. RN Notified

## 2017-04-01 NOTE — ED Notes (Addendum)
Pt d/c home per MD order. Discharge summary reviewed with pt. RX provided. Pt uninterested, irritable. Pt refused to sign e-signature. Pt signed for personal property and property returned. Pt ambulatory off unit with [facility] and [employer]. Taxi cab set up by Child psychotherapist and called to provide pt transportation home.

## 2017-04-01 NOTE — [facility] Suicide Risk Assessment (Signed)
Suicide Risk Assessment  Discharge Assessment   [facility] Discharge Suicide Risk Assessment   Principal Problem: Cocaine abuse with cocaine-induced mood disorder [facility]) Discharge Diagnoses:  Patient Active Problem List   Diagnosis Date Noted  . Cocaine abuse with cocaine-induced mood disorder ([facility]) [F14.14] 04/01/2017    Priority: High  . Cocaine abuse [F14.10] 03/31/2017    Priority: High  . Opiate abuse, episodic [F11.10] 01/03/2017    Priority: High  . Alcohol abuse [F10.10]   . HTN (hypertension) [I10] 06/20/2016  . Tobacco use disorder [F17.200] 06/20/2016  . Trauma [T14.90XA] 07/27/2015  . Cannabis use disorder, moderate, dependence ([facility]) [F12.20]   . PTSD (post-traumatic stress disorder) [F43.10]   . Substance induced mood disorder ([facility]) [F19.94] 07/07/2014  . Alcohol use disorder, moderate, dependence ([facility]) [F10.20] 04/10/2012  . Suicidal thoughts [R45.851] 04/04/2012    Total Time spent with patient: 45 minutes  Musculoskeletal: Strength & Muscle Tone: within normal limits Gait & Station: normal Patient leans: N/A  Psychiatric Specialty Exam: Physical Exam  Constitutional: He is oriented to person, place, and time. He appears well-developed and well-nourished.  HENT:  Head: Normocephalic.  Neck: Normal range of motion.  Respiratory: Effort normal.  Musculoskeletal: Normal range of motion.  Neurological: He is alert and oriented to person, place, and time.  Psychiatric: His speech is normal and behavior is normal. Judgment and thought content normal. Cognition and memory are normal. He exhibits a depressed mood.    Review of Systems  Psychiatric/Behavioral: Positive for depression and substance abuse.  All other systems reviewed and are negative.   Blood pressure 110/64, pulse 63, temperature 98.1 F (36.7 C), temperature source Oral, resp. rate 16, SpO2 99 %.There is no height or weight on file to calculate BMI.  General Appearance: Casual  Eye Contact:  Good   Speech:  Normal Rate  Volume:  Normal  Mood:  Depressed, mild  Affect:  Congruent  Thought Process:  Coherent and Descriptions of Associations: Intact  Orientation:  Full (Time, Place, and Person)  Thought Content:  WDL and Logical  Suicidal Thoughts:  No  Homicidal Thoughts:  No  Memory:  Immediate;   Good Recent;   Good Remote;   Good  Judgement:  Fair  Insight:  Fair  Psychomotor Activity:  Normal  Concentration:  Concentration: Good and Attention Span: Good  Recall:  Good  Fund of Knowledge:  Fair  Language:  Good  Akathisia:  No  Handed:  Right  AIMS (if indicated):     Assets:  Leisure Time Physical Health Resilience  ADL's:  Intact  Cognition:  WNL  Sleep:      Mental Status Per Nursing Assessment::   On Admission:   cocaine abuse with suicidal/homicidal ideations  Demographic Factors:  Male and Caucasian  Loss Factors: NA  Historical Factors: NA  Risk Reduction Factors:   Sense of responsibility to family and Positive social support  Continued Clinical Symptoms:  Depression, mild  Cognitive Features That Contribute To Risk:  None    Suicide Risk:  Minimal: No identifiable suicidal ideation.  Patients presenting with no risk factors but with morbid ruminations; may be classified as minimal risk based on the severity of the depressive symptoms    Plan Of Care/Follow-up recommendations:  Activity:  as tolerated Diet:  heart healthy diet  Chase Curci, NP 04/01/2017, 5:11 PM

## 2017-04-01 NOTE — ED Notes (Signed)
Pt asking this nurse if the police can provide him a ride home because they brought him here.

## 2017-04-01 NOTE — ED Notes (Signed)
This nurse notified Erin-social worker in regards to pt needing assistance with transportation home.

## 2017-04-02 ENCOUNTER — Encounter (HOSPITAL_COMMUNITY): Payer: Self-pay | Admitting: Emergency Medicine

## 2017-04-02 ENCOUNTER — Emergency Department (HOSPITAL_COMMUNITY)
Admission: EM | Admit: 2017-04-02 | Discharge: 2017-04-03 | Disposition: A | Discharge status: Home / Self Care | Payer: Medicaid Other | Attending: Emergency Medicine | Admitting: Emergency Medicine

## 2017-04-02 DIAGNOSIS — Z87891 Personal history of nicotine dependence: Secondary | ICD-10-CM | POA: Insufficient documentation

## 2017-04-02 DIAGNOSIS — F329 Major depressive disorder, single episode, unspecified: Secondary | ICD-10-CM | POA: Diagnosis not present

## 2017-04-02 DIAGNOSIS — Z79899 Other long term (current) drug therapy: Secondary | ICD-10-CM | POA: Insufficient documentation

## 2017-04-02 DIAGNOSIS — R45851 Suicidal ideations: Secondary | ICD-10-CM | POA: Diagnosis present

## 2017-04-02 DIAGNOSIS — I10 Essential (primary) hypertension: Secondary | ICD-10-CM | POA: Diagnosis not present

## 2017-04-02 LAB — CBC WITH DIFFERENTIAL/PLATELET
Basophils Absolute: 0 10*3/uL (ref 0.0–0.1)
Basophils Relative: 0 %
Eosinophils Absolute: 0.2 10*3/uL (ref 0.0–0.7)
Eosinophils Relative: 2 %
HCT: 43 % (ref 39.0–52.0)
Hemoglobin: 14.4 g/dL (ref 13.0–17.0)
Lymphocytes Relative: 41 %
Lymphs Abs: 3.2 10*3/uL (ref 0.7–4.0)
MCH: 29.7 pg (ref 26.0–34.0)
MCHC: 33.5 g/dL (ref 30.0–36.0)
MCV: 88.7 fL (ref 78.0–100.0)
Monocytes Absolute: 0.6 10*3/uL (ref 0.1–1.0)
Monocytes Relative: 8 %
Neutro Abs: 3.9 10*3/uL (ref 1.7–7.7)
Neutrophils Relative %: 49 %
Platelets: 226 10*3/uL (ref 150–400)
RBC: 4.85 MIL/uL (ref 4.22–5.81)
RDW: 13.9 % (ref 11.5–15.5)
WBC: 7.9 10*3/uL (ref 4.0–10.5)

## 2017-04-02 LAB — COMPREHENSIVE METABOLIC PANEL
ALT: 27 U/L (ref 17–63)
AST: 23 U/L (ref 15–41)
Albumin: 4.2 g/dL (ref 3.5–5.0)
Alkaline Phosphatase: 83 U/L (ref 38–126)
Anion gap: 12 (ref 5–15)
BUN: 14 mg/dL (ref 6–20)
CO2: 26 mmol/L (ref 22–32)
Calcium: 9.4 mg/dL (ref 8.9–10.3)
Chloride: 101 mmol/L (ref 101–111)
Creatinine, Ser: 0.9 mg/dL (ref 0.61–1.24)
GFR calc Af Amer: >60 mL/min (ref >60)
GFR calc non Af Amer: >60 mL/min (ref >60)
Glucose, Bld: 106 mg/dL — ABNORMAL HIGH (ref 65–99)
Potassium: 4.2 mmol/L (ref 3.5–5.1)
Sodium: 139 mmol/L (ref 135–145)
Total Bilirubin: 0.6 mg/dL (ref 0.3–1.2)
Total Protein: 7 g/dL (ref 6.5–8.1)

## 2017-04-02 LAB — RAPID URINE DRUG SCREEN, HOSP PERFORMED
Amphetamines: NOT DETECTED
Barbiturates: NOT DETECTED
Benzodiazepines: NOT DETECTED
Cocaine: NOT DETECTED
Opiates: NOT DETECTED
Tetrahydrocannabinol: POSITIVE — AB

## 2017-04-02 LAB — ETHANOL: Alcohol, Ethyl (B): <5 mg/dL (ref <5)

## 2017-04-02 MED ORDER — ACETAMINOPHEN 325 MG PO TABS: 650.0000 mg | ORAL_TABLET | ORAL | Status: DC | PRN

## 2017-04-02 MED ORDER — HYDROXYZINE PAMOATE 25 MG PO CAPS
25.0000 mg | ORAL_CAPSULE | Freq: Every day | ORAL | Status: DC
  Filled 2017-04-02 (×2): qty 1

## 2017-04-02 MED ORDER — CARBAMAZEPINE 200 MG PO TABS
200.0000 mg | ORAL_TABLET | Freq: Two times a day (BID) | ORAL | Status: DC
  Administered 2017-04-02 – 2017-04-03 (×4): 200 mg via ORAL
  Filled 2017-04-02 (×5): qty 1

## 2017-04-02 MED ORDER — ZOLPIDEM TARTRATE 5 MG PO TABS: 5.0000 mg | ORAL_TABLET | Freq: Every evening | ORAL | Status: DC | PRN

## 2017-04-02 MED ORDER — ONDANSETRON HCL 4 MG PO TABS
4.0000 mg | ORAL_TABLET | Freq: Three times a day (TID) | ORAL | Status: DC | PRN
  Filled 2017-04-02: qty 1

## 2017-04-02 MED ORDER — IBUPROFEN 200 MG PO TABS: 600.0000 mg | ORAL_TABLET | Freq: Three times a day (TID) | ORAL | Status: DC | PRN

## 2017-04-02 MED ORDER — ALUM & MAG HYDROXIDE-SIMETH 200-200-20 MG/5ML PO SUSP
30.0000 mL | ORAL | Status: DC | PRN
  Filled 2017-04-02: qty 30

## 2017-04-02 MED ORDER — TRAZODONE HCL 100 MG PO TABS
100.0000 mg | ORAL_TABLET | Freq: Every day | ORAL | Status: DC
  Administered 2017-04-02: 100 mg via ORAL
  Filled 2017-04-02: qty 1

## 2017-04-02 MED ORDER — CITALOPRAM HYDROBROMIDE 10 MG PO TABS
10.0000 mg | ORAL_TABLET | Freq: Every day | ORAL | Status: DC
  Administered 2017-04-02 – 2017-04-03 (×2): 10 mg via ORAL
  Filled 2017-04-02 (×2): qty 1

## 2017-04-02 MED ORDER — PRAZOSIN HCL 5 MG PO CAPS
5.0000 mg | ORAL_CAPSULE | Freq: Every day | ORAL | Status: DC
  Administered 2017-04-02: 5 mg via ORAL
  Filled 2017-04-02 (×3): qty 1

## 2017-04-02 MED ORDER — PRAVASTATIN SODIUM 40 MG PO TABS
40.0000 mg | ORAL_TABLET | Freq: Every evening | ORAL | Status: DC
  Administered 2017-04-02 – 2017-04-03 (×2): 40 mg via ORAL
  Filled 2017-04-02 (×3): qty 1

## 2017-04-02 MED ORDER — ARIPIPRAZOLE 10 MG PO TABS
10.0000 mg | ORAL_TABLET | Freq: Every day | ORAL | Status: DC
  Administered 2017-04-02 – 2017-04-03 (×2): 10 mg via ORAL
  Filled 2017-04-02 (×2): qty 1

## 2017-04-02 MED ORDER — HYDROXYZINE HCL 25 MG PO TABS
25.0000 mg | ORAL_TABLET | Freq: Every day | ORAL | Status: DC
  Administered 2017-04-02: 25 mg via ORAL

## 2017-04-02 MED ORDER — PANTOPRAZOLE SODIUM 40 MG PO TBEC
40.0000 mg | DELAYED_RELEASE_TABLET | Freq: Every day | ORAL | Status: DC | PRN
  Filled 2017-04-02: qty 1

## 2017-04-02 MED ORDER — ZOLPIDEM TARTRATE 5 MG PO TABS: 10.0000 mg | ORAL_TABLET | Freq: Every evening | ORAL | Status: DC | PRN

## 2017-04-02 MED ORDER — ATENOLOL 25 MG PO TABS
50.0000 mg | ORAL_TABLET | Freq: Every day | ORAL | Status: DC
  Administered 2017-04-02 – 2017-04-03 (×2): 50 mg via ORAL
  Filled 2017-04-02: qty 1
  Filled 2017-04-02: qty 2

## 2017-04-02 MED ORDER — LISINOPRIL 2.5 MG PO TABS
5.0000 mg | ORAL_TABLET | Freq: Every day | ORAL | Status: DC
  Administered 2017-04-02: 5 mg via ORAL
  Filled 2017-04-02: qty 1
  Filled 2017-04-02: qty 2

## 2017-04-02 NOTE — ED Notes (Signed)
TTS machine placed at bedside.   

## 2017-04-02 NOTE — ED Notes (Signed)
Woke pt from sleeping so may obtain VS's. Pt returned to sleeping.

## 2017-04-02 NOTE — ED Notes (Signed)
Pt c/o room is hot - ice water given. Pt aware will be reassessed in am.

## 2017-04-02 NOTE — ED Triage Notes (Signed)
Pt arrived with [employer] , voluntary, with thoughts of harming himself. States he had been off of his medication and went to [facility] yesterday to be seen.  He started taking his medications again yesterday but does not feel any better today. Continues to be depressed and having nightmares.

## 2017-04-02 NOTE — ED Notes (Signed)
Sitter from staffing at bedside.  
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
## 2017-04-02 NOTE — ED Notes (Signed)
Lauren, [facility], aware pt waiting for TTS.

## 2017-04-02 NOTE — ED Notes (Signed)
Samantha at [facility] called to TTS patient, this RN advised Lelon Mast that another [facility] counselor was seeing someone at this time and TTS machine would be placed in patients room upon its availability.

## 2017-04-02 NOTE — ED Notes (Signed)
Pt sleeping; Dinner ordered

## 2017-04-02 NOTE — ED Notes (Signed)
Lunch order placed

## 2017-04-02 NOTE — ED Notes (Signed)
Pt's belongings inventoried - 3 labeled belongings bags placed in lower cabinet - locked. Valuables envelope given to security - cell phone, wall charging cord, wall charger, wallet w/credit card, ID - no money noted.

## 2017-04-02 NOTE — ED Notes (Signed)
Dinner tray at bedside
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
## 2017-04-02 NOTE — ED Notes (Signed)
Waiting on pharmacy to send medication 

## 2017-04-03 DIAGNOSIS — Z79899 Other long term (current) drug therapy: Secondary | ICD-10-CM

## 2017-04-03 DIAGNOSIS — F1414 Cocaine abuse with cocaine-induced mood disorder: Secondary | ICD-10-CM

## 2017-04-03 DIAGNOSIS — Z87891 Personal history of nicotine dependence: Secondary | ICD-10-CM

## 2017-04-03 DIAGNOSIS — R45851 Suicidal ideations: Secondary | ICD-10-CM

## 2017-04-03 NOTE — BH Assessment (Signed)
Per Laurie, NP pt is able to be d/c [facility] RN made aware that the pt can be d/c.  ° °Aquicha Duff, LCSWA °CSW Disposition °[phone] °

## 2017-04-03 NOTE — Discharge Instructions (Signed)
Please follow-up at [facility] for your ongoing mental health needs.  Please return without fail for worsening symptoms, including feeling unsafe, thoughts of wanting to hurt self or others, or any other symptoms concerning to you.

## 2017-04-03 NOTE — Progress Notes (Signed)
CSW engaged with Patient at his bedside. CSW introduced self and role of CSW. Patient reports having concerns with housing. Patient reports that he had been living with his now deceased sister and his brother-in-law but notes that since the passing of his sister, he and his brother have not been getting along. Per Patient, he has job opportunities in [place] and [place], [place] but needs assistance getting there. CSW discussed that CSW can provide a local bus pass or a PART bus but noted that we do not have transportation assistance to [place], [place]. CSW provided Patient with list of local shelters, boarding homes, extended stay hotels, and transitional housing. CSW also discussed the homelessness prevention program with [employer]. Pt states that his sister was his payee and since she passed his money keeps coming up short. CSW unable to provide transportation to [place] but can provide bus pass to local shelter if Patient is agreeable.    Enos Fling, MSW, LCSW [facility] ED/63M Clinical Social Worker [phone]

## 2017-04-03 NOTE — ED Notes (Signed)
Social work here to eval pt

## 2017-04-03 NOTE — ED Provider Notes (Signed)
Pt. Seen by TTS and recommend D/C. Copied recommendation: Treatment Plan Summary: Discharge Home  Follow up with [facility] for psychiatry/therapy and medication management, may follow up with Alcohol and drug Services as previously suggested by Dr Jannifer Franklin at [facility] Take all medications as prescribed, Pt was provided medication prescriptions upon discharge from [facility] on 04-01-17.  Do not use drugs or alcohol Stay well hydrated and eat a balanced diet.  Activity as tolerated.   Disposition: No evidence of imminent risk to self or others at present.   Patient does not meet criteria for psychiatric inpatient admission. Supportive therapy provided about ongoing stressors. Discussed crisis plan, support from social network, calling 911, coming to the Emergency Department, and calling Suicide Hotline.  Laveda Abbe, NP 04/03/2017 9:23 AM   11:00-at this time the patient is alert and conversant.  He understands that his antidepressant medication, will take a while to begin to work and that he needs to follow-up with psychiatry services.  He then related to me problems with his home setting, not having a place to live because he has been arguing with his brother-in-law with whom he had been staying.  He also relates that he has a job in either [place] or [place] if he can get there.  He does not have any active suicidal ideation or plan this time.  He would like to talk to the social worker prior to leaving.       Mancel Bale, MD 04/06/17 2216

## 2017-04-03 NOTE — ED Provider Notes (Signed)
Evaluated by TTS and they are recommending discharge. Patient well appearing. No SI/HI at this time. Records reviewed. Vitals stable.  Felt stable for discharge.     Lavera Guise, MD 04/03/17 2002

## 2017-04-03 NOTE — ED Notes (Signed)
Pt given sprite 

## 2017-04-03 NOTE — ED Notes (Signed)
Placed social work consult per dr Effie Shy and [employer] order.

## 2017-04-03 NOTE — ED Notes (Signed)
Pt eating lunch
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
## 2017-04-03 NOTE — ED Notes (Signed)
TTS being done now 

## 2017-04-03 NOTE — ED Notes (Signed)
Pt was taken to shower and provided new scrubs.

## 2017-04-03 NOTE — Consult Note (Signed)
Telepsych Consultation   Reason for Consult:  Suicidal Ideation Referring Physician:  EDP  Patient Identification: Chase Moss MRN:  [id number] Principal Diagnosis: Suicidal thoughts Diagnosis:   Patient Active Problem List   Diagnosis Date Noted  . Suicidal thoughts [R45.851] 04/04/2012    Priority: High  . Cocaine abuse with cocaine-induced mood disorder ([place]) [F14.14] 04/01/2017  . Cocaine abuse [F14.10] 03/31/2017  . Opiate abuse, episodic [F11.10] 01/03/2017  . Alcohol abuse [F10.10]   . HTN (hypertension) [I10] 06/20/2016  . Tobacco use disorder [F17.200] 06/20/2016  . Trauma [T14.90XA] 07/27/2015  . Cannabis use disorder, moderate, dependence ([place]) [F12.20]   . PTSD (post-traumatic stress disorder) [F43.10]   . Substance induced mood disorder ([place]) [F19.94] 07/07/2014  . Alcohol use disorder, moderate, dependence ([place]) [F10.20] 04/10/2012    Total Time spent with patient: 30 minutes  Subjective:   Chase Moss is a 51 y.o. male patient admitted with suicidal ideation.  HPI:  Per tele assessment note on chart written by Marisa Cyphers, [facility] Counselor: Chase Moss is an 51 y.o. male who presents voluntarily to [facility] via law enforcement. He complains of SI and depression x 1 week. He didn't identify any specific trigger, just citing "the way my life's going, which isn't well" as the cause. Pt went to [facility] with similar complaints, to also include HI, on 03/30/17. He was restarted on his meds and d/c on 04/01/17. Upon d/c, pt says [place] [address] with intentions to jump or run into the traffic. Pt reports being out there for 2 hours and eventually using his cell phone to call for help. Pt says that, due to the late night hours, [facility] wasn't an option for him, even though that's where he initially requested to get help from, since he has established care with them on on IP basis. Pt added that, "I wasn't ready to go, I just started to get my meds back and I still  felt suicidal and depressed and I told them that".  Consulted with Catalina Pizza, DNP, concerning ths case. There has been some inconsistencies made in pt's statement but, due to the severity of his current statements, pt will be observed overnight (to identify any other inconsistencies) and re-evaluated by psychiatry in the morning.   Today during tele psych consult: Pt was seen and chart reviewed.  I have reviewed and concur with HPI elements below, modified as follows:   Chase Moss is a 51 year old male who presented to the [facility], voluntarily with [employer] complaining of suicidal thoughts without a specific plan.  Pt denies suicidal/homicidal ideation, denies auditory/visual hallucinations and does not appear to be responding to internal stimuli.  Pt denies homicidal ideation, denies auditory/visual hallucinations and does not appear to be responding to internal stimuli. Pt does admit to suicidal thoughts but was unable to state a specific plan. Pt ruminates about his sister who passed a few months ago and has a lot of anger toward his brother-in-law who had her cremated and is "stealing" his disability money. Pt stated his sister was his payee and since she passed his money keeps coming up short. Pt stated he had been staying with his brother-in-law but currently is homeless. Pt has a high degree of secondary gain by seeking inpatient admission due to his homelessness. Pt was inpatient at [facility] in January this year, then went to jail and upon his release he started using cocaine and marijuana. Pt's UDS positive for marijuana. Pt was seen at [facility]  long ED on 03-30-17, evaluated by Dr Darleene Cleaver and released to follow up with [facility].  Pt was restarted on his home medications and then presented to the [facility] saying he was still depressed. This Probation officer and Pt discussed the need to continue on his medications and give them time to work and to follow up with Alcohol and drug services or go to  [place] [facility] for therapy/psychiatry and medication management. Pt could benefit from a new payee for his disability money as well.   Discussed case with Dr Dwyane Dee who recommends that Pt be discharged and follow up with outpatient services that can help him receive help with psychiatry and medication management.     Past Psychiatric History: Depression. Polysubstance abuse disorder, PTSD, ETOH abuse disorder, trauma.   Risk to Self: Suicidal Ideation: Yes-Currently Present Suicidal Intent: Yes-Currently Present Is patient at risk for suicide?: Yes Suicidal Plan?: No-Not Currently/Within Last 6 Months Specify Current Suicidal Plan: pt planned to [place] [address] [place] or jump into the traffic Access to Means: Yes What has been your use of drugs/alcohol within the last 12 months?: see above How many times?: 1 Triggers for Past Attempts: Unknown Intentional Self Injurious Behavior: None Risk to Others: Homicidal Ideation: No Thoughts of Harm to Others: No Current Homicidal Intent: No Current Homicidal Plan: No Access to Homicidal Means: No History of harm to others?: No Assessment of Violence: None Noted Does patient have access to weapons?: No Criminal Charges Pending?: No Does patient have a court date: No Prior Inpatient Therapy: Prior Inpatient Therapy: Yes Prior Therapy Dates: 2018, 2017, 2016, 2015 Prior Therapy Facilty/Provider(s): [facility], [facility], [facility], [facility]  Reason for Treatment: MDD, SI Prior Outpatient Therapy: Prior Outpatient Therapy: No Does patient have an ACCT team?: No Does patient have Intensive In-House Services?  : No Does patient have [facility] services? : Yes Does patient have [facility] services?: No  Past Medical History:  Past Medical History:  Diagnosis Date  . Bipolar 1 disorder ([place])   . Depression   . Hypertension   . PTSD (post-traumatic stress disorder)    History reviewed. No pertinent surgical history. Family History:  Family History  Problem Relation Age of  Onset  . Heart attack Other    Family Psychiatric  History: Unknown Social History:  History  Alcohol Use No    Comment: daily alcohol     History  Drug Use No    Comment: several times a day- pt reports hasn't used since detoxic     Social History   Social History  . Marital status: Divorced    Spouse name: N/A  . Number of children: N/A  . Years of education: N/A   Social History Main Topics  . Smoking status: Former Smoker    Packs/day: 1.00    Years: 30.00    Types: Cigarettes  . Smokeless tobacco: Never Used  . Alcohol use No     Comment: daily alcohol  . Drug use: No     Comment: several times a day- pt reports hasn't used since detoxic   . Sexual activity: Not Asked   Other Topics Concern  . None   Social History Narrative  . None   Additional Social History:    Allergies:  No Known Allergies  Labs:  Results for orders placed or performed during the hospital encounter of 04/02/17 (from the past 48 hour(s))  Comprehensive metabolic panel     Status: Abnormal   Collection Time: 04/02/17  6:43 AM  Result Value Ref Range   Sodium 139 135 - 145 mmol/L   Potassium 4.2 3.5 - 5.1 mmol/L   Chloride 101 101 - 111 mmol/L   CO2 26 22 - 32 mmol/L   Glucose, Bld 106 (H) 65 - 99 mg/dL   BUN 14 6 - 20 mg/dL   Creatinine, Ser 0.90 0.61 - 1.24 mg/dL   Calcium 9.4 8.9 - 10.3 mg/dL   Total Protein 7.0 6.5 - 8.1 g/dL   Albumin 4.2 3.5 - 5.0 g/dL   AST 23 15 - 41 U/L   ALT 27 17 - 63 U/L   Alkaline Phosphatase 83 38 - 126 U/L   Total Bilirubin 0.6 0.3 - 1.2 mg/dL   GFR calc non Af Amer >60 >60 mL/min   GFR calc Af Amer >60 >60 mL/min    Comment: (NOTE) The eGFR has been calculated using the CKD EPI equation. This calculation has not been validated in all clinical situations. eGFR's persistently <60 mL/min signify possible Chronic Kidney Disease.    Anion gap 12 5 - 15  CBC with Differential     Status: None   Collection Time: 04/02/17  6:43 AM  Result Value  Ref Range   WBC 7.9 4.0 - 10.5 K/uL   RBC 4.85 4.22 - 5.81 MIL/uL   Hemoglobin 14.4 13.0 - 17.0 g/dL   HCT 43.0 39.0 - 52.0 %   MCV 88.7 78.0 - 100.0 fL   MCH 29.7 26.0 - 34.0 pg   MCHC 33.5 30.0 - 36.0 g/dL   RDW 13.9 11.5 - 15.5 %   Platelets 226 150 - 400 K/uL   Neutrophils Relative % 49 %   Neutro Abs 3.9 1.7 - 7.7 K/uL   Lymphocytes Relative 41 %   Lymphs Abs 3.2 0.7 - 4.0 K/uL   Monocytes Relative 8 %   Monocytes Absolute 0.6 0.1 - 1.0 K/uL   Eosinophils Relative 2 %   Eosinophils Absolute 0.2 0.0 - 0.7 K/uL   Basophils Relative 0 %   Basophils Absolute 0.0 0.0 - 0.1 K/uL  Ethanol     Status: None   Collection Time: 04/02/17  6:43 AM  Result Value Ref Range   Alcohol, Ethyl (B) <5 <5 mg/dL    Comment:        LOWEST DETECTABLE LIMIT FOR SERUM ALCOHOL IS 5 mg/dL FOR MEDICAL PURPOSES ONLY   Urine rapid drug screen (hosp performed)     Status: Abnormal   Collection Time: 04/02/17  6:48 AM  Result Value Ref Range   Opiates NONE DETECTED NONE DETECTED   Cocaine NONE DETECTED NONE DETECTED   Benzodiazepines NONE DETECTED NONE DETECTED   Amphetamines NONE DETECTED NONE DETECTED   Tetrahydrocannabinol POSITIVE (A) NONE DETECTED   Barbiturates NONE DETECTED NONE DETECTED    Comment:        DRUG SCREEN FOR MEDICAL PURPOSES ONLY.  IF CONFIRMATION IS NEEDED FOR ANY PURPOSE, NOTIFY LAB WITHIN 5 DAYS.        LOWEST DETECTABLE LIMITS FOR URINE DRUG SCREEN Drug Class       Cutoff (ng/mL) Amphetamine      1000 Barbiturate      200 Benzodiazepine   970 Tricyclics       263 Opiates          300 Cocaine          300 THC              50     Current  Facility-Administered Medications  Medication Dose Route Frequency Provider Last Rate Last Dose  . acetaminophen (TYLENOL) tablet 650 mg  650 mg Oral Q4H PRN Dalia Heading, PA-C      . alum & mag hydroxide-simeth (MAALOX/MYLANTA) 200-200-20 MG/5ML suspension 30 mL  30 mL Oral PRN Dalia Heading, PA-C      .  ARIPiprazole (ABILIFY) tablet 10 mg  10 mg Oral Daily Dalia Heading, PA-C   10 mg at 04/03/17 0920  . atenolol (TENORMIN) tablet 50 mg  50 mg Oral Daily Dalia Heading, PA-C   50 mg at 04/03/17 1027  . carbamazepine (TEGRETOL) tablet 200 mg  200 mg Oral BID PC [name] Lawyer, PA-C   200 mg at 04/03/17 2536  . citalopram (CELEXA) tablet 10 mg  10 mg Oral Daily Dalia Heading, PA-C   10 mg at 04/03/17 6440  . hydrOXYzine (ATARAX/VISTARIL) tablet 25 mg  25 mg Oral QHS [name] Lawyer, PA-C   25 mg at 04/02/17 2318  . ibuprofen (ADVIL,MOTRIN) tablet 600 mg  600 mg Oral Q8H PRN Dalia Heading, PA-C      . lisinopril (PRINIVIL,ZESTRIL) tablet 5 mg  5 mg Oral Daily Dalia Heading, PA-C   5 mg at 04/02/17 0909  . ondansetron (ZOFRAN) tablet 4 mg  4 mg Oral Q8H PRN Dalia Heading, PA-C      . pantoprazole (PROTONIX) EC tablet 40 mg  40 mg Oral Daily PRN Dalia Heading, PA-C      . pravastatin (PRAVACHOL) tablet 40 mg  40 mg Oral QPM [name] Lawyer, PA-C   40 mg at 04/02/17 1841  . prazosin (MINIPRESS) capsule 5 mg  5 mg Oral QHS Dalia Heading, PA-C   5 mg at 04/02/17 2306  . traZODone (DESYREL) tablet 100 mg  100 mg Oral QHS Dalia Heading, PA-C   100 mg at 04/02/17 2306  . zolpidem (AMBIEN) tablet 10 mg  10 mg Oral QHS PRN Dalia Heading, PA-C      . zolpidem (AMBIEN) tablet 5 mg  5 mg Oral QHS PRN Dalia Heading, PA-C       Current Outpatient Prescriptions  Medication Sig Dispense Refill  . ARIPiprazole (ABILIFY) 5 MG tablet Take 2 tablets (10 mg total) by mouth daily. 30 tablet 0  . atenolol (TENORMIN) 50 MG tablet TAKE 50 MG BY MOUTH DAILY (Patient taking differently: Take 50 mg by mouth daily. ) 30 tablet 0  . carbamazepine (TEGRETOL) 200 MG tablet Take 1 tablet (200 mg total) by mouth 2 (two) times daily after a meal. 60 tablet 0  . citalopram (CELEXA) 10 MG tablet Take 1 tablet (10 mg total) by mouth daily. 30 tablet 0  . hydrOXYzine  (VISTARIL) 25 MG capsule Take 1 capsule (25 mg total) by mouth at bedtime. 30 capsule 0  . lisinopril (PRINIVIL,ZESTRIL) 5 MG tablet Take 1 tablet (5 mg total) by mouth daily. 30 tablet 0  . pantoprazole (PROTONIX) 40 MG tablet Take 1 tablet (40 mg total) by mouth daily. (Patient taking differently: Take 40 mg by mouth daily as needed (for acid reflux). ) 30 tablet 0  . pravastatin (PRAVACHOL) 40 MG tablet Take 40 mg by mouth every evening.    . prazosin (MINIPRESS) 5 MG capsule Take 5 mg by mouth at bedtime.  0  . traZODone (DESYREL) 100 MG tablet Take 1 tablet (100 mg total) by mouth at bedtime. 30 tablet 0  . zolpidem (AMBIEN) 10 MG tablet Take 10 mg by mouth at bedtime as needed for sleep.    Marland Kitchen  prazosin (MINIPRESS) 2 MG capsule Take 2 capsules (4 mg total) by mouth at bedtime. (Patient not taking: Reported on 03/30/2017) 60 capsule 0    Musculoskeletal: Unable to assess: camera  Psychiatric Specialty Exam: Physical Exam  ROS  Blood pressure 118/60, pulse 65, temperature 98.3 F (36.8 C), temperature source Oral, resp. rate 18, SpO2 100 %.There is no height or weight on file to calculate BMI.  General Appearance: Casual and Fairly Groomed  Eye Contact:  Good  Speech:  Clear and Coherent and Normal Rate  Volume:  Normal  Mood:  Depressed  Affect:  Congruent and Depressed  Thought Process:  Coherent, Goal Directed and Linear  Orientation:  Full (Time, Place, and Person)  Thought Content:  Rumination  Suicidal Thoughts:  Yes.  without intent/plan  Homicidal Thoughts:  No  Memory:  Immediate;   Good Recent;   Good Remote;   Fair  Judgement:  Fair  Insight:  Fair  Psychomotor Activity:  Normal  Concentration:  Concentration: Good and Attention Span: Good  Recall:  Good  Fund of Knowledge:  Good  Language:  Good  Akathisia:  No  Handed:  Right  AIMS (if indicated):     Assets:  Communication Skills Desire for Improvement Financial Resources/Insurance Leisure Time Physical  Health Resilience Social Support  ADL's:  Intact  Cognition:  WNL  Sleep:        Treatment Plan Summary: Discharge Home  Follow up with [facility] for psychiatry/therapy and medication management, may follow up with Alcohol and drug Services as previously suggested by Dr Darleene Cleaver at [facility] Take all medications as prescribed, Pt was provided medication prescriptions upon discharge from [facility] on 04-01-17.  Do not use drugs or alcohol Stay well hydrated and eat a balanced diet.  Activity as tolerated.   Disposition: No evidence of imminent risk to self or others at present.   Patient does not meet criteria for psychiatric inpatient admission. Supportive therapy provided about ongoing stressors. Discussed crisis plan, support from social network, calling 911, coming to the Emergency Department, and calling Suicide Hotline.  Ethelene Hal, NP 04/03/2017 9:23 AM
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
## 2017-05-15 ENCOUNTER — Encounter (HOSPITAL_COMMUNITY): Payer: Self-pay | Admitting: Emergency Medicine

## 2017-05-15 ENCOUNTER — Encounter (HOSPITAL_COMMUNITY): Payer: Self-pay | Admitting: *Deleted

## 2017-05-15 ENCOUNTER — Emergency Department (HOSPITAL_COMMUNITY)
Admission: EM | Admit: 2017-05-15 | Discharge: 2017-05-15 | Disposition: A | Discharge status: Other Acute Inpatient Hospital | Payer: Medicaid Other | Attending: Emergency Medicine | Admitting: Emergency Medicine

## 2017-05-15 ENCOUNTER — Inpatient Hospital Stay (HOSPITAL_COMMUNITY)
Admission: EM | Admit: 2017-05-15 | Discharge: 2017-05-22 | DRG: 885 | Disposition: A | Discharge status: Home / Self Care | Payer: Medicaid Other | Source: Intra-hospital | Attending: Psychiatry | Admitting: Psychiatry

## 2017-05-15 DIAGNOSIS — R45851 Suicidal ideations: Secondary | ICD-10-CM

## 2017-05-15 DIAGNOSIS — F1910 Other psychoactive substance abuse, uncomplicated: Secondary | ICD-10-CM

## 2017-05-15 DIAGNOSIS — F333 Major depressive disorder, recurrent, severe with psychotic symptoms: Secondary | ICD-10-CM | POA: Diagnosis not present

## 2017-05-15 DIAGNOSIS — F1220 Cannabis dependence, uncomplicated: Secondary | ICD-10-CM | POA: Diagnosis present

## 2017-05-15 DIAGNOSIS — G4700 Insomnia, unspecified: Secondary | ICD-10-CM | POA: Diagnosis present

## 2017-05-15 DIAGNOSIS — F1290 Cannabis use, unspecified, uncomplicated: Secondary | ICD-10-CM | POA: Diagnosis not present

## 2017-05-15 DIAGNOSIS — F17210 Nicotine dependence, cigarettes, uncomplicated: Secondary | ICD-10-CM | POA: Diagnosis present

## 2017-05-15 DIAGNOSIS — F1210 Cannabis abuse, uncomplicated: Secondary | ICD-10-CM | POA: Diagnosis not present

## 2017-05-15 DIAGNOSIS — F1420 Cocaine dependence, uncomplicated: Secondary | ICD-10-CM | POA: Diagnosis present

## 2017-05-15 DIAGNOSIS — X789XXA Intentional self-harm by unspecified sharp object, initial encounter: Secondary | ICD-10-CM | POA: Diagnosis present

## 2017-05-15 DIAGNOSIS — K219 Gastro-esophageal reflux disease without esophagitis: Secondary | ICD-10-CM | POA: Diagnosis not present

## 2017-05-15 DIAGNOSIS — S51812A Laceration without foreign body of left forearm, initial encounter: Secondary | ICD-10-CM | POA: Diagnosis present

## 2017-05-15 DIAGNOSIS — E7800 Pure hypercholesterolemia, unspecified: Secondary | ICD-10-CM | POA: Diagnosis not present

## 2017-05-15 DIAGNOSIS — Y999 Unspecified external cause status: Secondary | ICD-10-CM | POA: Insufficient documentation

## 2017-05-15 DIAGNOSIS — F1410 Cocaine abuse, uncomplicated: Secondary | ICD-10-CM | POA: Diagnosis not present

## 2017-05-15 DIAGNOSIS — F1490 Cocaine use, unspecified, uncomplicated: Secondary | ICD-10-CM | POA: Diagnosis not present

## 2017-05-15 DIAGNOSIS — F1010 Alcohol abuse, uncomplicated: Secondary | ICD-10-CM | POA: Diagnosis present

## 2017-05-15 DIAGNOSIS — S61512A Laceration without foreign body of left wrist, initial encounter: Secondary | ICD-10-CM

## 2017-05-15 DIAGNOSIS — F332 Major depressive disorder, recurrent severe without psychotic features: Secondary | ICD-10-CM | POA: Diagnosis not present

## 2017-05-15 DIAGNOSIS — F1020 Alcohol dependence, uncomplicated: Secondary | ICD-10-CM | POA: Diagnosis present

## 2017-05-15 DIAGNOSIS — R45850 Homicidal ideations: Secondary | ICD-10-CM | POA: Diagnosis present

## 2017-05-15 DIAGNOSIS — X780XXA Intentional self-harm by sharp glass, initial encounter: Secondary | ICD-10-CM | POA: Insufficient documentation

## 2017-05-15 DIAGNOSIS — I10 Essential (primary) hypertension: Secondary | ICD-10-CM | POA: Diagnosis not present

## 2017-05-15 DIAGNOSIS — Z79899 Other long term (current) drug therapy: Secondary | ICD-10-CM

## 2017-05-15 DIAGNOSIS — F1594 Other stimulant use, unspecified with stimulant-induced mood disorder: Secondary | ICD-10-CM | POA: Diagnosis present

## 2017-05-15 DIAGNOSIS — Y929 Unspecified place or not applicable: Secondary | ICD-10-CM | POA: Insufficient documentation

## 2017-05-15 DIAGNOSIS — F4310 Post-traumatic stress disorder, unspecified: Secondary | ICD-10-CM | POA: Diagnosis present

## 2017-05-15 DIAGNOSIS — F1994 Other psychoactive substance use, unspecified with psychoactive substance-induced mood disorder: Secondary | ICD-10-CM | POA: Diagnosis not present

## 2017-05-15 DIAGNOSIS — E876 Hypokalemia: Secondary | ICD-10-CM | POA: Diagnosis not present

## 2017-05-15 DIAGNOSIS — Y939 Activity, unspecified: Secondary | ICD-10-CM | POA: Diagnosis not present

## 2017-05-15 DIAGNOSIS — Z590 Homelessness: Secondary | ICD-10-CM

## 2017-05-15 LAB — COMPREHENSIVE METABOLIC PANEL
ALK PHOS: 74 U/L (ref 38–126)
ALT: 19 U/L (ref 17–63)
ANION GAP: 11 (ref 5–15)
AST: 20 U/L (ref 15–41)
Albumin: 4.4 g/dL (ref 3.5–5.0)
BILIRUBIN TOTAL: 0.6 mg/dL (ref 0.3–1.2)
BUN: 19 mg/dL (ref 6–20)
CO2: 21 mmol/L — AB (ref 22–32)
Calcium: 9 mg/dL (ref 8.9–10.3)
Chloride: 104 mmol/L (ref 101–111)
Creatinine, Ser: 1.14 mg/dL (ref 0.61–1.24)
GFR calc non Af Amer: >60 mL/min (ref >60)
Glucose, Bld: 95 mg/dL (ref 65–99)
Potassium: 3.1 mmol/L — ABNORMAL LOW (ref 3.5–5.1)
SODIUM: 136 mmol/L (ref 135–145)
TOTAL PROTEIN: 7.6 g/dL (ref 6.5–8.1)

## 2017-05-15 LAB — ACETAMINOPHEN LEVEL: Acetaminophen (Tylenol), Serum: <10 ug/mL — ABNORMAL LOW (ref 10–30)

## 2017-05-15 LAB — CBC
HCT: 45.9 % (ref 39.0–52.0)
Hemoglobin: 15.8 g/dL (ref 13.0–17.0)
MCH: 30.6 pg (ref 26.0–34.0)
MCHC: 34.4 g/dL (ref 30.0–36.0)
MCV: 89 fL (ref 78.0–100.0)
PLATELETS: 208 10*3/uL (ref 150–400)
RBC: 5.16 MIL/uL (ref 4.22–5.81)
RDW: 13.6 % (ref 11.5–15.5)
WBC: 9.6 10*3/uL (ref 4.0–10.5)

## 2017-05-15 LAB — RAPID URINE DRUG SCREEN, HOSP PERFORMED
Amphetamines: NOT DETECTED
Barbiturates: NOT DETECTED
Benzodiazepines: NOT DETECTED
Cocaine: POSITIVE — AB
OPIATES: NOT DETECTED
Tetrahydrocannabinol: POSITIVE — AB

## 2017-05-15 LAB — ETHANOL: Alcohol, Ethyl (B): 63 mg/dL — ABNORMAL HIGH (ref <5)

## 2017-05-15 LAB — SALICYLATE LEVEL

## 2017-05-15 MED ORDER — LORAZEPAM 1 MG PO TABS: 0.0000 mg | ORAL_TABLET | Freq: Two times a day (BID) | ORAL | Status: DC

## 2017-05-15 MED ORDER — NICOTINE 21 MG/24HR TD PT24
21.0000 mg | MEDICATED_PATCH | Freq: Every day | TRANSDERMAL | Status: DC
  Filled 2017-05-15: qty 1

## 2017-05-15 MED ORDER — PRAZOSIN HCL 5 MG PO CAPS: 5.0000 mg | ORAL_CAPSULE | Freq: Every day | ORAL | Status: DC

## 2017-05-15 MED ORDER — LORAZEPAM 1 MG PO TABS
0.0000 mg | ORAL_TABLET | Freq: Two times a day (BID) | ORAL | Status: AC
  Administered 2017-05-16 – 2017-05-17 (×3): 1 mg via ORAL
  Filled 2017-05-15 (×3): qty 1

## 2017-05-15 MED ORDER — PANTOPRAZOLE SODIUM 40 MG PO TBEC
40.0000 mg | DELAYED_RELEASE_TABLET | Freq: Every day | ORAL | Status: DC
  Administered 2017-05-16 – 2017-05-22 (×7): 40 mg via ORAL
  Filled 2017-05-15 (×10): qty 1

## 2017-05-15 MED ORDER — THIAMINE HCL 100 MG/ML IJ SOLN: 100.0000 mg | Freq: Every day | INTRAMUSCULAR | Status: DC

## 2017-05-15 MED ORDER — LORAZEPAM 1 MG PO TABS
0.0000 mg | ORAL_TABLET | Freq: Four times a day (QID) | ORAL | Status: AC
  Administered 2017-05-15 – 2017-05-16 (×4): 1 mg via ORAL
  Filled 2017-05-15 (×4): qty 1

## 2017-05-15 MED ORDER — IBUPROFEN 200 MG PO TABS: 600.0000 mg | ORAL_TABLET | Freq: Three times a day (TID) | ORAL | Status: DC | PRN

## 2017-05-15 MED ORDER — IBUPROFEN 600 MG PO TABS
600.0000 mg | ORAL_TABLET | Freq: Three times a day (TID) | ORAL | Status: DC | PRN
  Administered 2017-05-15 – 2017-05-18 (×3): 600 mg via ORAL
  Filled 2017-05-15 (×3): qty 1

## 2017-05-15 MED ORDER — PRAVASTATIN SODIUM 40 MG PO TABS
40.0000 mg | ORAL_TABLET | Freq: Every evening | ORAL | Status: DC
  Administered 2017-05-15 – 2017-05-21 (×7): 40 mg via ORAL
  Filled 2017-05-15 (×9): qty 1

## 2017-05-15 MED ORDER — BACITRACIN ZINC 500 UNIT/GM EX OINT
TOPICAL_OINTMENT | Freq: Two times a day (BID) | CUTANEOUS | Status: DC
  Administered 2017-05-15: 1 via TOPICAL
  Filled 2017-05-15: qty 0.9

## 2017-05-15 MED ORDER — TRAZODONE HCL 100 MG PO TABS
100.0000 mg | ORAL_TABLET | Freq: Every day | ORAL | Status: DC
  Administered 2017-05-15 – 2017-05-20 (×6): 100 mg via ORAL
  Filled 2017-05-15 (×10): qty 1

## 2017-05-15 MED ORDER — LORAZEPAM 1 MG PO TABS
0.0000 mg | ORAL_TABLET | Freq: Four times a day (QID) | ORAL | Status: DC
  Administered 2017-05-15: 1 mg via ORAL
  Filled 2017-05-15: qty 1

## 2017-05-15 MED ORDER — ALUM & MAG HYDROXIDE-SIMETH 200-200-20 MG/5ML PO SUSP
30.0000 mL | ORAL | Status: DC | PRN
Start: 2017-05-15 — End: 2017-05-15

## 2017-05-15 MED ORDER — PANTOPRAZOLE SODIUM 40 MG PO TBEC
40.0000 mg | DELAYED_RELEASE_TABLET | Freq: Every day | ORAL | Status: DC
  Administered 2017-05-15: 40 mg via ORAL
  Filled 2017-05-15: qty 1

## 2017-05-15 MED ORDER — LIDOCAINE-EPINEPHRINE 2 %-1:200000 IJ SOLN
20.0000 mL | Freq: Once | INTRAMUSCULAR | Status: AC
Start: 2017-05-15 — End: 2017-05-15
  Administered 2017-05-15: 20 mL via INTRADERMAL
  Filled 2017-05-15: qty 20

## 2017-05-15 MED ORDER — ACETAMINOPHEN 325 MG PO TABS: 650.0000 mg | ORAL_TABLET | ORAL | Status: DC | PRN

## 2017-05-15 MED ORDER — NICOTINE 21 MG/24HR TD PT24
21.0000 mg | MEDICATED_PATCH | Freq: Every day | TRANSDERMAL | Status: DC
  Filled 2017-05-15 (×5): qty 1

## 2017-05-15 MED ORDER — ONDANSETRON HCL 4 MG PO TABS
4.0000 mg | ORAL_TABLET | Freq: Three times a day (TID) | ORAL | Status: DC | PRN
  Administered 2017-05-18: 4 mg via ORAL
  Filled 2017-05-15: qty 1

## 2017-05-15 MED ORDER — PRAVASTATIN SODIUM 40 MG PO TABS: 40.0000 mg | ORAL_TABLET | Freq: Every evening | ORAL | Status: DC

## 2017-05-15 MED ORDER — ALUM & MAG HYDROXIDE-SIMETH 200-200-20 MG/5ML PO SUSP: 30.0000 mL | ORAL | Status: DC | PRN

## 2017-05-15 MED ORDER — BACITRACIN ZINC 500 UNIT/GM EX OINT
TOPICAL_OINTMENT | Freq: Two times a day (BID) | CUTANEOUS | Status: DC
Start: 2017-05-15 — End: 2017-05-22
  Administered 2017-05-15 – 2017-05-17 (×5): via TOPICAL
  Administered 2017-05-18: 1 via TOPICAL
  Administered 2017-05-19 – 2017-05-21 (×6): via TOPICAL
  Administered 2017-05-22: 1 via TOPICAL
  Filled 2017-05-15: qty 28.35

## 2017-05-15 MED ORDER — BUPROPION HCL ER (XL) 150 MG PO TB24
150.0000 mg | ORAL_TABLET | Freq: Every day | ORAL | Status: DC
  Administered 2017-05-16 – 2017-05-18 (×3): 150 mg via ORAL
  Filled 2017-05-15 (×5): qty 1

## 2017-05-15 MED ORDER — ARIPIPRAZOLE 5 MG PO TABS
5.0000 mg | ORAL_TABLET | Freq: Every day | ORAL | Status: DC
  Administered 2017-05-15 – 2017-05-16 (×2): 5 mg via ORAL
  Filled 2017-05-15 (×4): qty 1

## 2017-05-15 MED ORDER — PNEUMOCOCCAL VAC POLYVALENT 25 MCG/0.5ML IJ INJ: 0.5000 mL | INJECTION | INTRAMUSCULAR | Status: DC

## 2017-05-15 MED ORDER — ATENOLOL 50 MG PO TABS
50.0000 mg | ORAL_TABLET | Freq: Every day | ORAL | Status: DC
  Administered 2017-05-16 – 2017-05-21 (×6): 50 mg via ORAL
  Filled 2017-05-15 (×10): qty 1

## 2017-05-15 MED ORDER — MAGNESIUM HYDROXIDE 400 MG/5ML PO SUSP
30.0000 mL | Freq: Every day | ORAL | Status: DC | PRN
Start: 2017-05-15 — End: 2017-05-22

## 2017-05-15 MED ORDER — PRAZOSIN HCL 5 MG PO CAPS
5.0000 mg | ORAL_CAPSULE | Freq: Every day | ORAL | Status: DC
  Administered 2017-05-15 – 2017-05-21 (×7): 5 mg via ORAL
  Filled 2017-05-15 (×9): qty 1

## 2017-05-15 MED ORDER — LISINOPRIL 5 MG PO TABS
5.0000 mg | ORAL_TABLET | Freq: Every day | ORAL | Status: DC
  Administered 2017-05-15: 5 mg via ORAL
  Filled 2017-05-15: qty 1

## 2017-05-15 MED ORDER — POTASSIUM CHLORIDE CRYS ER 20 MEQ PO TBCR
40.0000 meq | EXTENDED_RELEASE_TABLET | Freq: Once | ORAL | Status: AC
  Administered 2017-05-15: 40 meq via ORAL
  Filled 2017-05-15: qty 2

## 2017-05-15 MED ORDER — VITAMIN B-1 100 MG PO TABS
100.0000 mg | ORAL_TABLET | Freq: Every day | ORAL | Status: DC
  Administered 2017-05-15: 100 mg via ORAL
  Filled 2017-05-15: qty 1

## 2017-05-15 MED ORDER — VITAMIN B-1 100 MG PO TABS
100.0000 mg | ORAL_TABLET | Freq: Every day | ORAL | Status: DC
  Administered 2017-05-16 – 2017-05-22 (×7): 100 mg via ORAL
  Filled 2017-05-15 (×11): qty 1

## 2017-05-15 MED ORDER — TETANUS-DIPHTH-ACELL PERTUSSIS 5-2.5-18.5 LF-MCG/0.5 IM SUSP
0.5000 mL | Freq: Once | INTRAMUSCULAR | Status: DC
  Filled 2017-05-15: qty 0.5

## 2017-05-15 MED ORDER — LISINOPRIL 5 MG PO TABS
5.0000 mg | ORAL_TABLET | Freq: Every day | ORAL | Status: DC
  Administered 2017-05-16 – 2017-05-22 (×7): 5 mg via ORAL
  Filled 2017-05-15 (×10): qty 1

## 2017-05-15 MED ORDER — ATENOLOL 50 MG PO TABS
50.0000 mg | ORAL_TABLET | Freq: Every day | ORAL | Status: DC
  Administered 2017-05-15: 50 mg via ORAL
  Filled 2017-05-15: qty 1

## 2017-05-15 MED ORDER — ONDANSETRON HCL 4 MG PO TABS: 4.0000 mg | ORAL_TABLET | Freq: Three times a day (TID) | ORAL | Status: DC | PRN

## 2017-05-15 NOTE — Tx Team (Signed)
Initial Treatment Plan 05/15/2017 2:34 PM Chase Moss [id number]    PATIENT STRESSORS: Loss of mother and sister to death Substance abuse   PATIENT STRENGTHS: Average or above average intelligence Communication skills Motivation for treatment/growth   PATIENT IDENTIFIED PROBLEMS: At risk for suicide  Substance Abuse  "Get back on meds"  "Quit feeling miserable everytime I get up"               DISCHARGE CRITERIA:  Ability to meet basic life and health needs Improved stabilization in mood, thinking, and/or behavior Motivation to continue treatment in a less acute level of care Need for constant or close observation no longer present Verbal commitment to aftercare and medication compliance Withdrawal symptoms are absent or subacute and managed without 24-hour nursing intervention  PRELIMINARY DISCHARGE PLAN: Attend 12-step recovery group Outpatient therapy Return to previous living arrangement  PATIENT/FAMILY INVOLVEMENT: This treatment plan has been presented to and reviewed with the patient, Chase Moss.  The patient and family have been given the opportunity to ask questions and make suggestions.  Chase Moss, Chase Ishman P, RN 05/15/2017, 2:34 PM

## 2017-05-15 NOTE — Plan of Care (Signed)
Problem: Safety: Goal: Periods of time without injury will increase Outcome: Progressing Pt. remains a low fall risk, denies SI/HI/AVH at this time, Q 15 checks in effect.    
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
## 2017-05-15 NOTE — ED Notes (Signed)
PT having TTS consult. 
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
## 2017-05-15 NOTE — [facility] Group Notes (Signed)
[facility] LCSW Group Therapy  05/15/2017 4:00 PM  Type of Therapy:  Group Therapy  Participation Level:  Did Not Attend-invited. Chose to remain in room.   Summary of Progress/Problems: Self Sabotage: Patients were asked to explore the meaning of self sabotage and how this has affected their lives and recovery. Patients were given examples of self sabotage and encouraged to share personal examples of how this impacted their lives, what they learned from that experience, and explore ways to avoid these behaviors in the future.   Jule Schlabach N Smart LCSW 05/15/2017, 4:00 PM

## 2017-05-15 NOTE — ED Notes (Signed)
Pt transported to [facility] by [facility]. All belongings returned to pt who signed for same.  

## 2017-05-15 NOTE — ED Notes (Signed)
Called [facility] to pick pt up for transport.

## 2017-05-15 NOTE — ED Notes (Signed)
Pt has 2 self inflicted  lacerations noted to left wrist one 4.5cm length the second 6.5cm in length. Pt admits to be SI and stated that he was trying to drink himself to death, and would of jumped off a bridge if a cab driver hadn't stopped him. Pt admits to drinking 2 40 OZ beers this evening and has cocaine and marijuana use. Pt is tearful and stated that he has suffered from depression a long time and has worsened with the death of his sister in January in addition to recent events have lead him to being homeless and states " I  Rather die than llive on the street, I have to much pride".

## 2017-05-15 NOTE — BH Assessment (Signed)
[facility] Assessment Progress Note  Per Nira ConnJason Berry, NP, this pt requires psychiatric hospitalization at this time.  Clint Bolderori Beck, RN, [id number]  Pt has signed Voluntary Admission and Consent for Treatment, as well as Consent to Release Information to no one, and signed [facility].  Pt's nurse has been notified, and agrees to send original paperwork along with pt via [facility], and to call report to [phone].  Doylene Canninghomas Shernell Saldierna, MA Triage Specialist [phone]

## 2017-05-15 NOTE — ED Notes (Signed)
Patient reports SI and attempted to commit suicide by cutting wrist and threaten to jump off bridge.  Patient denies HI and AVH. Encouragement and support provided and safety maintain. Q 15 min safety check in place and video monitoring.

## 2017-05-15 NOTE — ED Notes (Signed)
Introduced self to patient. Pt oriented to unit expectations.  Assessed pt for:  A) Anxiety &/or agitation: Pt has stayed in bed this morning, calm and cooperative. No complaints voiced. Affect flat, mood depressed. Continues to feel SI. Left wrist had intact dressing.  S) Safety: Safety maintained with q-15-minute checks and hourly rounds by staff.  A) ADLs: Pt able to perform ADLs independently.  P) Pick-Up (room cleanliness): Pt's room clean and free of clutter.

## 2017-05-15 NOTE — Progress Notes (Signed)
  DATA ACTION RESPONSE  Objective- Pt. is visible in the room, seen laying in bed asleep. Pt. was awaken for assessment. Presents with a flat/depressed affect and mood. No further c/o. No abnormal s/s. Pt. was encourage to go out to dayroom for snacks and bedtime meds. Appropriate with interaction but isolates self to room.     Subjective- Denies having any HI/AVH/Pain at this time. Passive SI but verbal contracts for safety. Pt. states " I'm just really depressed; It's an ongoing thing".Is cooperative and remain safe on the unit.  1:1 interaction in private to establish rapport. Encouragement, education, & support given from staff. BP/HR stable, see flow sheet.    Safety maintained with Q 15 checks. Continue with POC.

## 2017-05-15 NOTE — ED Notes (Signed)
Patient educated about search process and term "contraband " and routine search performed. No contraband found. 

## 2017-05-15 NOTE — ED Provider Notes (Signed)
[facility]-EM[facility]ENCY DEPT Provider Note: [name], MD, FACEP  CSN: [id number] MRN: [id number] ARRIVAL: 05/15/17 at 0251 ROOM: WA16/WA16   CHIEF COMPLAINT  Suicidal   HISTORY OF PRESENT ILLNESS  Chase Moss is a 51 y.o. male with a history of bipolar disorder, PTSD and polysubstance abuse. He is here after having cut his left wrist twice with a broken bottle and an attempt to kill himself. He cannot tell me when he did this. EMS reports he was also threatening to jump off a bridge but was stopped by a bystander. He admits to drinking 3, 40 ounce beers. He states he has become despondent due to homelessness and the loss of his sister earlier this year. She was his principal caretaker. He states there is moderate stinging associated with the lacerations on his left wrist.  The patient has been noncompliant with his medications.   Past Medical History:  Diagnosis Date  . Bipolar 1 disorder (HCC)   . Depression   . Hypertension   . PTSD (post-traumatic stress disorder)     History reviewed. No pertinent surgical history.  Family History  Problem Relation Age of Onset  . Heart attack Other     Social History  Substance Use Topics  . Smoking status: Current Every Day Smoker    Packs/day: 1.00    Years: 30.00    Types: Cigarettes  . Smokeless tobacco: Never Used  . Alcohol use 1.2 oz/week    2 Cans of beer per week     Comment: daily alcohol    Prior to Admission medications   Medication Sig Start Date End Date Taking? Authorizing Provider  ARIPiprazole (ABILIFY) 5 MG tablet Take 2 tablets (10 mg total) by mouth daily. Patient not taking: Reported on 05/15/2017 04/01/17   Charm Rings, NP  atenolol (TENORMIN) 50 MG tablet TAKE 50 MG BY MOUTH DAILY Patient not taking: Reported on 05/15/2017 06/24/16   Pucilowska, Ellin Goodie, MD  carbamazepine (TEGRETOL) 200 MG tablet Take 1 tablet (200 mg total) by mouth 2 (two) times daily after a meal. Patient not taking: Reported on  05/15/2017 04/01/17   Charm Rings, NP  citalopram (CELEXA) 10 MG tablet Take 1 tablet (10 mg total) by mouth daily. Patient not taking: Reported on 05/15/2017 04/01/17   Charm Rings, NP  hydrOXYzine (VISTARIL) 25 MG capsule Take 1 capsule (25 mg total) by mouth at bedtime. Patient not taking: Reported on 05/15/2017 04/01/17   Charm Rings, NP  lisinopril (PRINIVIL,ZESTRIL) 5 MG tablet Take 1 tablet (5 mg total) by mouth daily. Patient not taking: Reported on 05/15/2017 06/24/16   Pucilowska, Braulio Conte B, MD  pantoprazole (PROTONIX) 40 MG tablet Take 1 tablet (40 mg total) by mouth daily. Patient not taking: Reported on 05/15/2017 06/24/16   Pucilowska, Braulio Conte B, MD  pravastatin (PRAVACHOL) 40 MG tablet Take 40 mg by mouth every evening. 04/24/16 04/24/21  [provider]  prazosin (MINIPRESS) 2 MG capsule Take 2 capsules (4 mg total) by mouth at bedtime. Patient not taking: Reported on 03/30/2017 06/24/16   Pucilowska, Braulio Conte B, MD  prazosin (MINIPRESS) 5 MG capsule Take 5 mg by mouth at bedtime. 01/15/17   [provider]  traZODone (DESYREL) 100 MG tablet Take 1 tablet (100 mg total) by mouth at bedtime. Patient not taking: Reported on 05/15/2017 04/01/17   Charm Rings, NP  zolpidem (AMBIEN) 10 MG tablet Take 10 mg by mouth at bedtime as needed for sleep. 01/14/17   [provider]    Allergies Patient has no known allergies.   REVIEW OF SYSTEMS  Negative except as noted here or in the History of Present Illness.   PHYSICAL EXAMINATION  Initial Vital Signs Blood pressure (!) 161/110, pulse 86, temperature 98.3 F (36.8 C), temperature source Oral, resp. rate 20, SpO2 98 %.  Examination General: Well-developed, well-nourished male in no acute distress; appearance consistent with age of record HENT: normocephalic; atraumatic; breath smells of alcohol Eyes: pupils equal, round and reactive to light; extraocular muscles intact Neck: supple Heart: regular rate and  rhythm Lungs: clear to auscultation bilaterally Abdomen: soft; nondistended; nontender; no masses or hepatosplenomegaly; bowel sounds present Extremities: No deformity; full range of motion; pulses normal; 2 horizontal lacerations to the volar left wrist, tendon visible and wound but no tendon injury or functional deficit seen Neurologic: Awake, alert and oriented; motor function intact in all extremities and symmetric; no facial droop Skin: Warm and dry Psychiatric: Depressed mood with congruent affect; suicidal ideation   RESULTS  Summary of this visit's results, reviewed by myself:   EKG Interpretation  Date/Time:    Ventricular Rate:    PR Interval:    QRS Duration:   QT Interval:    QTC Calculation:   R Axis:     Text Interpretation:        Laboratory Studies: Results for orders placed or performed during the hospital encounter of 05/15/17 (from the past 24 hour(s))  Comprehensive metabolic panel     Status: Abnormal   Collection Time: 05/15/17  3:01 AM  Result Value Ref Range   Sodium 136 135 - 145 mmol/L   Potassium 3.1 (L) 3.5 - 5.1 mmol/L   Chloride 104 101 - 111 mmol/L   CO2 21 (L) 22 - 32 mmol/L   Glucose, Bld 95 65 - 99 mg/dL   BUN 19 6 - 20 mg/dL   Creatinine, Ser 1.61 0.61 - 1.24 mg/dL   Calcium 9.0 8.9 - 09.6 mg/dL   Total Protein 7.6 6.5 - 8.1 g/dL   Albumin 4.4 3.5 - 5.0 g/dL   AST 20 15 - 41 U/L   ALT 19 17 - 63 U/L   Alkaline Phosphatase 74 38 - 126 U/L   Total Bilirubin 0.6 0.3 - 1.2 mg/dL   GFR calc non Af Amer >60 >60 mL/min   GFR calc Af Amer >60 >60 mL/min   Anion gap 11 5 - 15  Ethanol     Status: Abnormal   Collection Time: 05/15/17  3:01 AM  Result Value Ref Range   Alcohol, Ethyl (B) 63 (H) <5 mg/dL  Salicylate level     Status: None   Collection Time: 05/15/17  3:01 AM  Result Value Ref Range   Salicylate Lvl <7.0 2.8 - 30.0 mg/dL  Acetaminophen level     Status: Abnormal   Collection Time: 05/15/17  3:01 AM  Result Value Ref Range    Acetaminophen (Tylenol), Serum <10 (L) 10 - 30 ug/mL  cbc     Status: None   Collection Time: 05/15/17  3:01 AM  Result Value Ref Range   WBC 9.6 4.0 - 10.5 K/uL   RBC 5.16 4.22 - 5.81 MIL/uL   Hemoglobin 15.8 13.0 - 17.0 g/dL   HCT 04.5 40.9 - 81.1 %   MCV 89.0 78.0 - 100.0 fL   MCH 30.6 26.0 - 34.0 pg   MCHC 34.4 30.0 - 36.0 g/dL   RDW 91.4 78.2 - 95.6 %  Platelets 208 150 - 400 K/uL  Rapid urine drug screen (hospital performed)     Status: Abnormal   Collection Time: 05/15/17  3:01 AM  Result Value Ref Range   Opiates NONE DETECTED NONE DETECTED   Cocaine POSITIVE (A) NONE DETECTED   Benzodiazepines NONE DETECTED NONE DETECTED   Amphetamines NONE DETECTED NONE DETECTED   Tetrahydrocannabinol POSITIVE (A) NONE DETECTED   Barbiturates NONE DETECTED NONE DETECTED   Imaging Studies: No results found.  ED COURSE  Nursing notes and initial vitals signs, including pulse oximetry, reviewed.  Vitals:   05/15/17 0253 05/15/17 0300  BP:  (!) 161/110  Pulse:  86  Resp:  20  Temp:  98.3 F (36.8 C)  TempSrc:  Oral  SpO2: 98% 98%    PROCEDURES   LACERATION REPAIR Performed by: Hanley SeamenMOLPUS,Sahalie Beth L Authorized by: Hanley SeamenMOLPUS,Leone Mobley L Consent: Verbal consent obtained. Risks and benefits: risks, benefits and alternatives were discussed Consent given by: patient Patient identity confirmed: provided demographic data Prepped and Draped in normal sterile fashion Wound explored  Laceration Location: Left wrist  Laceration Length: 6 cm  No Foreign Bodies seen or palpated  Anesthesia: local infiltration  Local anesthetic: lidocaine 2 % with epinephrine  Anesthetic total: 8 ml  Irrigation method: syringe Amount of cleaning: standard  Skin closure: Staples   Number of staples: 8   Patient tolerance: Patient tolerated the procedure well with no immediate complications.   LACERATION REPAIR Performed by: Hanley SeamenMOLPUS,Liliane Mallis L Authorized by: Hanley SeamenMOLPUS,Derrien Anschutz L Consent: Verbal consent  obtained. Risks and benefits: risks, benefits and alternatives were discussed Consent given by: patient Patient identity confirmed: provided demographic data Prepped and Draped in normal sterile fashion Wound explored  Laceration Location: Left wrist  Laceration Length: 4.5 cm  No Foreign Bodies seen or palpated  Anesthesia: local infiltration  Local anesthetic: lidocaine 2 % with epinephrine  Anesthetic total: 2 ml  Irrigation method: syringe Amount of cleaning: standard  Skin closure: Staples   Number of staples: 3   Patient tolerance: Patient tolerated the procedure well with no immediate complications.      ED DIAGNOSES     ICD-9-CM ICD-10-CM   1. Suicidal ideation V62.84 R45.851   2. Polysubstance abuse 305.90 F19.10   3. Self-inflicted laceration of wrist, left, initial encounter 881.02 S61.512A    E956    4. Hypokalemia 276.8 E87.6        Emeree Mahler, Jonny RuizJohn, MD 05/15/17 [id number]
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
## 2017-05-15 NOTE — ED Notes (Signed)
Wound irrigated. Pt states that he had a tetanus shot 3 years ago, refused Booster.
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
## 2017-05-15 NOTE — ED Notes (Signed)
Attempted to call report. Admitting nurse, Erskine SquibbJane, will call when ready for report.

## 2017-05-16 MED ORDER — ARIPIPRAZOLE 5 MG PO TABS
5.0000 mg | ORAL_TABLET | Freq: Once | ORAL | Status: AC
  Administered 2017-05-16: 5 mg via ORAL
  Filled 2017-05-16: qty 1

## 2017-05-16 MED ORDER — ARIPIPRAZOLE 10 MG PO TABS
20.0000 mg | ORAL_TABLET | Freq: Every day | ORAL | Status: DC
  Administered 2017-05-17 – 2017-05-22 (×6): 20 mg via ORAL
  Filled 2017-05-16 (×9): qty 2

## 2017-05-16 NOTE — Social Work (Signed)
Referred to [facility], is [place] resident.  Archana Eckman, LCSW Lead Clinical Social Worker Phone:  [phone]  

## 2017-05-16 NOTE — Progress Notes (Addendum)
D:  Patient's self inventory sheet, patient has poor sleep, sleep medication not helpful.  Fair appetite, low energy level, poor concentration.  Rated depression, hopeless and anxiety #8.  Withdrawals, chilling, cramping, runny nose, irritability, night sweats.  SI, sometimes, contracts for safety, no plan.  Physical problems, lightheaded, pain, wrist pain.  Worst pain in past 24 hours #7.  Goal is getting stable back on medications, rest, caught up.  Plans to take meds and rest.  No discharge plans.  "My attitude towards life and if it worth it any more." A:  Medications administered per MD orders.  Emotional support and encouragement given patient. R:  SI off/on, contracts for safety, no plan.  HI to brother-in-law who knows how he feels.  Has dreams, and does have A/V hallucinations. Dressing changed to L arm.  No signs of drainage or infection noted. Patient refused pneumonia vaccine today, may decide to take vaccine later.
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
## 2017-05-16 NOTE — Progress Notes (Signed)
Pt did attend the evening karaoke group. Pt was engaged but did not participate by singing a song.  

## 2017-05-16 NOTE — [facility] Group Notes (Signed)
[facility] LCSW Group Therapy  05/16/2017 1:15 PM  Type of Therapy:  Group Therapy  Participation Level:  Did Not Attend-pt in bed sleeping. CSW made 2 other attempts to meet with pt today--pt remains in bed sleeping.   Modes of Intervention:  Confrontation, Discussion, Education, Problem-solving, Socialization and Support  Summary of Progress/Problems: Feelings Around Diagnosis. Patients were asked to explore feelings associated with being diagnosed with a mental illness/substance abuse disorder. Group member were asked to identify support network and explore their understanding of their specific diagnosis, whether they agree with it, and how they can use this information to assist in recovery.   Kacyn Souder N Smart LCSW 05/16/2017, 1:15 PM

## 2017-05-16 NOTE — Progress Notes (Signed)
Pt did not attend group, pt encouraged to attend group and remained in bed.

## 2017-05-16 NOTE — [facility] Group Notes (Signed)
The focus of this group is to educate the patient on the purpose and policies of crisis stabilization and provide a format to answer questions about their admission.  The group details unit policies and expectations of patients while admitted.  Patient did not attend 0900 nurse education orientation group this morning.  Patient stayed in bed.   

## 2017-05-16 NOTE — Progress Notes (Addendum)
  DATA ACTION RESPONSE  Objective- Pt. is visible in the dayroom, seen watching TV. Makes effort to program today. Presents with an anxious/depressed affect and mood. Brightens upon interaction. Endorses withdrawal s/s.  Subjective- Denies having any HI/AVH/Pain at this time. Passive SI but verbally contracts for safety.Is cooperative and remain safe on the unit.  1:1 interaction in private to establish rapport. Encouragement, education, & support given from staff. Laceration on L. Wrist assessed and dressing changed per Pt. request. Scant serosanguinous drainage. No swelling.   Safety maintained with Q 15 checks. Continue with POC.
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
## 2017-05-16 NOTE — Progress Notes (Signed)
[facility] MD Progress Note  05/16/2017 5:48 PM Chase Moss  MRN:  [id number] Subjective:   [name], divorced, unemployed. Lives between the shelter and motel with a friend. Known history of SUD. Presented via EMS. Self inflicted lacerations to his left forearm/wrist. Was threatening to throw self off the a bridge.  Reported to have expressed being depressed. Lost his sister early this year. UDS positive for cocaine and THC. States that he had stopped taking his medications a couple of weeks ago. He has been drinking and doing a lot of drugs.  Chart reviewed today. Patient discussed at team.  Staff reports that he is still very irritable. Not coming out. In bed most of the time. Reports vivid dreams last night. Has expressed homicidal thoughts towards his brother-inlaw.  Taking his medications as prescribed.  Seen today. In bed. Says he is still feeling very irritable. Tired and wants to sleep. Suicidal thoughts pop up from time to time. Says he would speak to staff if it becomes overwhelming. Prefers  we talk more tomorrow.  Principal Problem: Substance induced mood disorder ([place]) Diagnosis:   Patient Active Problem List   Diagnosis Date Noted  . Major depressive disorder, recurrent, severe with psychotic features ([place]) [F33.3] 05/15/2017  . Cocaine abuse [F14.10] 03/31/2017  . Alcohol abuse [F10.10]   . HTN (hypertension) [I10] 06/20/2016  . Major depressive disorder, recurrent severe without psychotic features ([place]) [F33.2] 06/20/2016  . Tobacco use disorder [F17.200] 06/20/2016  . Trauma [T14.90XA] 07/27/2015  . Cannabis use disorder, moderate, dependence ([place]) [F12.20]   . PTSD (post-traumatic stress disorder) [F43.10]   . Substance induced mood disorder ([place]) [F19.94] 07/07/2014  . Alcohol use disorder, moderate, dependence ([place]) [F10.20] 04/10/2012  . Suicidal thoughts [R45.851] 04/04/2012   Total Time spent with patient: 20 minutes  Past Psychiatric History: As in  H&P  Past Medical History:  Past Medical History:  Diagnosis Date  . Bipolar 1 disorder ([place])   . Depression   . Hypertension   . PTSD (post-traumatic stress disorder)    History reviewed. No pertinent surgical history. Family History:  Family History  Problem Relation Age of Onset  . Heart attack Other    Family Psychiatric  History: As in H&P Social History:  History  Alcohol Use  . 1.2 oz/week  . 2 Cans of beer per week    Comment: daily alcohol     History  Drug Use  . Types: Cocaine, Marijuana    Comment: several times a day- pt reports hasn't used since detoxic     Social History   Social History  . Marital status: Divorced    Spouse name: N/A  . Number of children: N/A  . Years of education: N/A   Social History Main Topics  . Smoking status: Current Every Day Smoker    Packs/day: 1.00    Years: 30.00    Types: Cigarettes  . Smokeless tobacco: Never Used  . Alcohol use 1.2 oz/week    2 Cans of beer per week     Comment: daily alcohol  . Drug use: Yes    Types: Cocaine, Marijuana     Comment: several times a day- pt reports hasn't used since detoxic   . Sexual activity: Yes   Other Topics Concern  . None   Social History Narrative  . None   Additional Social History:    Sleep: Fair  Appetite:  Fair  Current Medications: Current Facility-Administered Medications  Medication Dose Route Frequency  Provider Last Rate Last Dose  . acetaminophen (TYLENOL) tablet 650 mg  650 mg Oral Q4H PRN Patrecia Pour, NP      . alum & mag hydroxide-simeth (MAALOX/MYLANTA) 200-200-20 MG/5ML suspension 30 mL  30 mL Oral PRN Patrecia Pour, NP      . ARIPiprazole (ABILIFY) tablet 5 mg  5 mg Oral Daily Jordanny Waddington, Laruth Bouchard, MD   5 mg at 05/16/17 0834  . atenolol (TENORMIN) tablet 50 mg  50 mg Oral Daily Patrecia Pour, NP   50 mg at 05/16/17 2993  . bacitracin ointment   Topical BID Patrecia Pour, NP      . buPROPion (WELLBUTRIN XL) 24 hr tablet 150 mg  150 mg  Oral Daily Artist Beach, MD   150 mg at 05/16/17 0835  . ibuprofen (ADVIL,MOTRIN) tablet 600 mg  600 mg Oral Q8H PRN Patrecia Pour, NP   600 mg at 05/16/17 7169  . lisinopril (PRINIVIL,ZESTRIL) tablet 5 mg  5 mg Oral Daily Patrecia Pour, NP   5 mg at 05/16/17 0835  . LORazepam (ATIVAN) tablet 0-4 mg  0-4 mg Oral Q6H Patrecia Pour, NP   1 mg at 05/16/17 1726   Followed by  . [START ON 05/17/2017] LORazepam (ATIVAN) tablet 0-4 mg  0-4 mg Oral Q12H Lord, Jamison Y, NP      . magnesium hydroxide (MILK OF MAGNESIA) suspension 30 mL  30 mL Oral Daily PRN Patrecia Pour, NP      . nicotine (NICODERM CQ - dosed in mg/24 hours) patch 21 mg  21 mg Transdermal Daily Patrecia Pour, NP      . ondansetron [facility]) tablet 4 mg  4 mg Oral Q8H PRN Patrecia Pour, NP      . pantoprazole (PROTONIX) EC tablet 40 mg  40 mg Oral Daily Patrecia Pour, NP   40 mg at 05/16/17 0836  . pneumococcal 23 valent vaccine (PNU-IMMUNE) injection 0.5 mL  0.5 mL Intramuscular Tomorrow-1000 Kerrie Buffalo, NP      . pravastatin (PRAVACHOL) tablet 40 mg  40 mg Oral QPM Patrecia Pour, NP   40 mg at 05/16/17 1727  . prazosin (MINIPRESS) capsule 5 mg  5 mg Oral QHS Patrecia Pour, NP   5 mg at 05/15/17 2141  . thiamine (VITAMIN B-1) tablet 100 mg  100 mg Oral Daily Patrecia Pour, NP   100 mg at 05/16/17 6789   Or  . thiamine (B-1) injection 100 mg  100 mg Intravenous Daily Patrecia Pour, NP      . traZODone (DESYREL) tablet 100 mg  100 mg Oral QHS Artist Beach, MD   100 mg at 05/15/17 2141    Lab Results:  Results for orders placed or performed during the hospital encounter of 05/15/17 (from the past 48 hour(s))  Comprehensive metabolic panel     Status: Abnormal   Collection Time: 05/15/17  3:01 AM  Result Value Ref Range   Sodium 136 135 - 145 mmol/L   Potassium 3.1 (L) 3.5 - 5.1 mmol/L   Chloride 104 101 - 111 mmol/L   CO2 21 (L) 22 - 32 mmol/L   Glucose, Bld 95 65 - 99 mg/dL   BUN 19 6 - 20  mg/dL   Creatinine, Ser 1.14 0.61 - 1.24 mg/dL   Calcium 9.0 8.9 - 10.3 mg/dL   Total Protein 7.6 6.5 - 8.1 g/dL   Albumin 4.4 3.5 -  5.0 g/dL   AST 20 15 - 41 U/L   ALT 19 17 - 63 U/L   Alkaline Phosphatase 74 38 - 126 U/L   Total Bilirubin 0.6 0.3 - 1.2 mg/dL   GFR calc non Af Amer >60 >60 mL/min   GFR calc Af Amer >60 >60 mL/min    Comment: (NOTE) The eGFR has been calculated using the CKD EPI equation. This calculation has not been validated in all clinical situations. eGFR's persistently <60 mL/min signify possible Chronic Kidney Disease.    Anion gap 11 5 - 15  Ethanol     Status: Abnormal   Collection Time: 05/15/17  3:01 AM  Result Value Ref Range   Alcohol, Ethyl (B) 63 (H) <5 mg/dL    Comment:        LOWEST DETECTABLE LIMIT FOR SERUM ALCOHOL IS 5 mg/dL FOR MEDICAL PURPOSES ONLY   Salicylate level     Status: None   Collection Time: 05/15/17  3:01 AM  Result Value Ref Range   Salicylate Lvl <8.1 2.8 - 30.0 mg/dL  Acetaminophen level     Status: Abnormal   Collection Time: 05/15/17  3:01 AM  Result Value Ref Range   Acetaminophen (Tylenol), Serum <10 (L) 10 - 30 ug/mL    Comment:        THERAPEUTIC CONCENTRATIONS VARY SIGNIFICANTLY. A RANGE OF 10-30 ug/mL MAY BE AN EFFECTIVE CONCENTRATION FOR MANY PATIENTS. HOWEVER, SOME ARE BEST TREATED AT CONCENTRATIONS OUTSIDE THIS RANGE. ACETAMINOPHEN CONCENTRATIONS >150 ug/mL AT 4 HOURS AFTER INGESTION AND >50 ug/mL AT 12 HOURS AFTER INGESTION ARE OFTEN ASSOCIATED WITH TOXIC REACTIONS.   cbc     Status: None   Collection Time: 05/15/17  3:01 AM  Result Value Ref Range   WBC 9.6 4.0 - 10.5 K/uL   RBC 5.16 4.22 - 5.81 MIL/uL   Hemoglobin 15.8 13.0 - 17.0 g/dL   HCT 45.9 39.0 - 52.0 %   MCV 89.0 78.0 - 100.0 fL   MCH 30.6 26.0 - 34.0 pg   MCHC 34.4 30.0 - 36.0 g/dL   RDW 13.6 11.5 - 15.5 %   Platelets 208 150 - 400 K/uL  Rapid urine drug screen (hospital performed)     Status: Abnormal   Collection Time:  05/15/17  3:01 AM  Result Value Ref Range   Opiates NONE DETECTED NONE DETECTED   Cocaine POSITIVE (A) NONE DETECTED   Benzodiazepines NONE DETECTED NONE DETECTED   Amphetamines NONE DETECTED NONE DETECTED   Tetrahydrocannabinol POSITIVE (A) NONE DETECTED   Barbiturates NONE DETECTED NONE DETECTED    Comment:        DRUG SCREEN FOR MEDICAL PURPOSES ONLY.  IF CONFIRMATION IS NEEDED FOR ANY PURPOSE, NOTIFY LAB WITHIN 5 DAYS.        LOWEST DETECTABLE LIMITS FOR URINE DRUG SCREEN Drug Class       Cutoff (ng/mL) Amphetamine      1000 Barbiturate      200 Benzodiazepine   017 Tricyclics       510 Opiates          300 Cocaine          300 THC              50     Blood Alcohol level:  Lab Results  Component Value Date   ETH 63 (H) 05/15/2017   ETH <5 25/85/2778    Metabolic Disorder Labs: Lab Results  Component Value Date   HGBA1C 5.2 06/21/2016  MPG 100 04/09/2012   Lab Results  Component Value Date   PROLACTIN 21.0 (H) 06/21/2016   Lab Results  Component Value Date   CHOL 193 06/21/2016   TRIG 226 (H) 06/21/2016   HDL 60 06/21/2016   CHOLHDL 3.2 06/21/2016   VLDL 45 (H) 06/21/2016   LDLCALC 88 06/21/2016    Physical Findings: AIMS: Facial and Oral Movements Muscles of Facial Expression: None, normal Lips and Perioral Area: None, normal Jaw: None, normal Tongue: None, normal,Extremity Movements Upper (arms, wrists, hands, fingers): None, normal Lower (legs, knees, ankles, toes): None, normal, Trunk Movements Neck, shoulders, hips: None, normal, Overall Severity Severity of abnormal movements (highest score from questions above): None, normal Incapacitation due to abnormal movements: None, normal Patient's awareness of abnormal movements (rate only patient's report): No Awareness, Dental Status Current problems with teeth and/or dentures?: No Does patient usually wear dentures?: No  CIWA:  CIWA-Ar Total: 1 COWS:  COWS Total Score:  1  Musculoskeletal: Strength & Muscle Tone: Unable to assess at this time.  Gait & Station: Unable to assess at this time.  Patient leans: Unable to assess at this time.   Psychiatric Specialty Exam: Physical Exam Unable to assess at this time.   ROS  Blood pressure 117/63, pulse (!) 57, temperature 97.5 F (36.4 C), temperature source Oral, resp. rate 18, height 5' 9"  (1.753 m), weight 79.4 kg (175 lb).Body mass index is 25.84 kg/m.  General Appearance: In bed, irritable. Poor rapport. Irritable and did not want to engage.   Eye Contact:  Minimal  Speech:  Clear and Coherent  Volume:  Normal  Mood:  Dysphoric and Irritable  Affect:  Congruent  Thought Process:  Linear  Orientation:  Unable to assess at this time.   Thought Content:  Unable to assess at this time.   Suicidal Thoughts:  Yes.  without intent/plan  Homicidal Thoughts:  Would explore tomorrow.   Memory:  Unable to assess at this time.   Judgement:  Poor  Insight:  Fair  Psychomotor Activity:  Decreased  Concentration:  Impaired   Recall:  Unable to assess at this time.   Fund of Knowledge:  Unable to assess at this time.   Language:  Good  Akathisia:  Negative  Handed:    AIMS (if indicated):     Assets:  Desire for Improvement  ADL's:  Impaired  Cognition:  Impaired,  Mild  Sleep:        Treatment Plan Summary: Patient is still very dysphoric. Withdrawals from stimulants explains his current symptoms. Would increase Abilify to 10 mg from tomorrow.  Psychiatric: SUD Substance Induced Mood Disorder  Medical: HTN  Psychosocial:  Homelessness Limited support Unemployed.   PLAN: 1. Increase Abilify to 10 mg daily 2. Encourage unit groups and activities 3. Monitor mood, behavior and interaction with peers 4. Motivational enhancement    Artist Beach, MD 05/16/2017, 5:48 PM

## 2017-05-16 NOTE — Plan of Care (Signed)
Problem: Activity: Goal: Interest or engagement in activities will improve Outcome: Progressing Pt. attended karaoke this evening provided with encouragement.    

## 2017-05-16 NOTE — Tx Team (Signed)
Interdisciplinary Treatment and Diagnostic Plan Update  05/16/2017 Time of Session: 0930 Chase Moss MRN: [id number]  Principal Diagnosis: Substance induced mood disorder (H[facility])  Secondary Diagnoses: Principal Problem:   Substance induced mood disorder ([facility]) Active Problems:   Major depressive disorder, recurrent, severe with psychotic features ([facility])   Current Medications:  Current Facility-Administered Medications  Medication Dose Route Frequency Provider Last Rate Last Dose  . acetaminophen (TYLENOL) tablet 650 mg  650 mg Oral Q4H PRN Charm Rings, NP      . alum & mag hydroxide-simeth (MAALOX/MYLANTA) 200-200-20 MG/5ML suspension 30 mL  30 mL Oral PRN Charm Rings, NP      . ARIPiprazole (ABILIFY) tablet 5 mg  5 mg Oral Daily Izediuno, Delight Ovens, MD   5 mg at 05/16/17 0834  . atenolol (TENORMIN) tablet 50 mg  50 mg Oral Daily Charm Rings, NP   50 mg at 05/16/17 4098  . bacitracin ointment   Topical BID Charm Rings, NP      . buPROPion (WELLBUTRIN XL) 24 hr tablet 150 mg  150 mg Oral Daily Georgiann Cocker, MD   150 mg at 05/16/17 0835  . ibuprofen (ADVIL,MOTRIN) tablet 600 mg  600 mg Oral Q8H PRN Charm Rings, NP   600 mg at 05/16/17 1191  . lisinopril (PRINIVIL,ZESTRIL) tablet 5 mg  5 mg Oral Daily Charm Rings, NP   5 mg at 05/16/17 0835  . LORazepam (ATIVAN) tablet 0-4 mg  0-4 mg Oral Q6H Charm Rings, NP   1 mg at 05/16/17 4782   Followed by  . [START ON 05/17/2017] LORazepam (ATIVAN) tablet 0-4 mg  0-4 mg Oral Q12H Lord, Jamison Y, NP      . magnesium hydroxide (MILK OF MAGNESIA) suspension 30 mL  30 mL Oral Daily PRN Charm Rings, NP      . nicotine (NICODERM CQ - dosed in mg/24 hours) patch 21 mg  21 mg Transdermal Daily Charm Rings, NP      . ondansetron [facility]) tablet 4 mg  4 mg Oral Q8H PRN Charm Rings, NP      . pantoprazole (PROTONIX) EC tablet 40 mg  40 mg Oral Daily Charm Rings, NP   40 mg at 05/16/17 0836  . pneumococcal 23  valent vaccine (PNU-IMMUNE) injection 0.5 mL  0.5 mL Intramuscular Tomorrow-1000 Adonis Brook, NP      . pravastatin (PRAVACHOL) tablet 40 mg  40 mg Oral QPM Charm Rings, NP   40 mg at 05/15/17 1735  . prazosin (MINIPRESS) capsule 5 mg  5 mg Oral QHS Charm Rings, NP   5 mg at 05/15/17 2141  . thiamine (VITAMIN B-1) tablet 100 mg  100 mg Oral Daily Charm Rings, NP   100 mg at 05/16/17 9562   Or  . thiamine (B-1) injection 100 mg  100 mg Intravenous Daily Charm Rings, NP      . traZODone (DESYREL) tablet 100 mg  100 mg Oral QHS Georgiann Cocker, MD   100 mg at 05/15/17 2141   PTA Medications: No prescriptions prior to admission.    Patient Stressors: Loss of mother and sister to death Substance abuse  Patient Strengths: Average or above average intelligence Communication skills Motivation for treatment/growth  Treatment Modalities: Medication Management, Group therapy, Case management,  1 to 1 session with clinician, Psychoeducation, Recreational therapy.   Physician Treatment Plan for Primary Diagnosis: Substance induced mood disorder (HCC)  Long Term Goal(s):     Short Term Goals:    Medication Management: Evaluate patient's response, side effects, and tolerance of medication regimen.  Therapeutic Interventions: 1 to 1 sessions, Unit Group sessions and Medication administration.  Evaluation of Outcomes: Progressing  Physician Treatment Plan for Secondary Diagnosis: Principal Problem:   Substance induced mood disorder (H[facility]) Active Problems:   Major depressive disorder, recurrent, severe with psychotic features (H[facility])  Long Term Goal(s):     Short Term Goals:       Medication Management: Evaluate patient's response, side effects, and tolerance of medication regimen.  Therapeutic Interventions: 1 to 1 sessions, Unit Group sessions and Medication administration.  Evaluation of Outcomes: Progressing   RN Treatment Plan for Primary Diagnosis: Substance  induced mood disorder (HCC) Long Term Goal(s): Knowledge of disease and therapeutic regimen to maintain health will improve  Short Term Goals: Ability to remain free from injury will improve, Ability to verbalize feelings will improve and Ability to disclose and discuss suicidal ideas  Medication Management: RN will administer medications as ordered by provider, will assess and evaluate patient's response and provide education to patient for prescribed medication. RN will report any adverse and/or side effects to prescribing provider.  Therapeutic Interventions: 1 on 1 counseling sessions, Psychoeducation, Medication administration, Evaluate responses to treatment, Monitor vital signs and CBGs as ordered, Perform/monitor CIWA, COWS, AIMS and Fall Risk screenings as ordered, Perform wound care treatments as ordered.  Evaluation of Outcomes: Progressing   LCSW Treatment Plan for Primary Diagnosis: Substance induced mood disorder (HCC) Long Term Goal(s): Safe transition to appropriate next level of care at discharge, Engage patient in therapeutic group addressing interpersonal concerns.  Short Term Goals: Engage patient in aftercare planning with referrals and resources, Facilitate patient progression through stages of change regarding substance use diagnoses and concerns and Identify triggers associated with mental health/substance abuse issues  Therapeutic Interventions: Assess for all discharge needs, 1 to 1 time with Social worker, Explore available resources and support systems, Assess for adequacy in community support network, Educate family and significant other(s) on suicide prevention, Complete Psychosocial Assessment, Interpersonal group therapy.  Evaluation of Outcomes: Progressing   Progress in Treatment: Attending groups: No. Pt remains in bed; not attending.  Participating in groups: No. Taking medication as prescribed: Yes. Toleration medication: Yes. Family/Significant other  contact made: No, will contact:  family member if patient consents Patient understands diagnosis: Yes. Discussing patient identified problems/goals with staff: Yes. Medical problems stabilized or resolved: Yes. Denies suicidal/homicidal ideation: Passive SI/Able to contract for safety on the unit.  Issues/concerns per patient self-inventory: No. Other: n/a   New problem(s) identified: No, Describe:  n/a  New Short Term/Long Term Goal(s): medication stabilization; mood stabilization; elimination of SI thoughts, development of comprehensive mental wellness/sobriety plan.   Discharge Plan or Barriers: CSW assessing for appropriate referrals. Pt lives in [place] in [place] if he can return; referred to [facility] during last psychiatric admission but states that he did not follow-up with outpatient recommendations.   Reason for Continuation of Hospitalization: Depression Medication stabilization Suicidal ideation Withdrawal symptoms  Estimated Length of Stay: 3-5 days   Attendees: Patient: 05/16/2017 10:49 AM  Physician: Dr. Jackquline Berlin MD 05/16/2017 10:49 AM  Nursing: Peterson Ao RN 05/16/2017 10:49 AM  RN Care Manager: Onnie Boer CM 05/16/2017 10:49 AM  Social Worker: Herbert Seta Smart, LCSW; Donnelly Stager LCSWA 05/16/2017 10:49 AM  Recreational Therapist: x 05/16/2017 10:49 AM  Other: Armandina Stammer NP; Gray Bernhardt NP 05/16/2017 10:49 AM  Other:  05/16/2017 10:49 AM  Other: 05/16/2017 10:49 AM    Scribe for Treatment Team: Ledell PeoplesHeather N Smart, LCSW 05/16/2017 10:49 AM

## 2017-05-16 NOTE — Plan of Care (Signed)
Problem: Coping: Goal: Ability to cope will improve Outcome: Progressing Nurse discussed depression/anxiety/coping skills with patient.    

## 2017-05-17 MED ORDER — LORAZEPAM 1 MG PO TABS
1.0000 mg | ORAL_TABLET | Freq: Three times a day (TID) | ORAL | Status: DC | PRN
  Administered 2017-05-17 – 2017-05-20 (×7): 1 mg via ORAL
  Filled 2017-05-17 (×7): qty 1

## 2017-05-17 NOTE — [facility] Group Notes (Signed)
[facility] LCSW Group Therapy  05/17/2017 3:38 PM  Type of Therapy:  Group Therapy  Participation Level:  Did Not Attend-pt invited. Sleeping in room. Continuing to refuse to discuss aftercare and asking to be left alone to sleep.   Summary of Progress/Problems: Today's Topic: Overcoming Obstacles. Patients identified one short term goal and potential obstacles in reaching this goal. Patients processed barriers involved in overcoming these obstacles. Patients identified steps necessary for overcoming these obstacles and explored motivation (internal and external) for facing these difficulties head on.   Ledell PeoplesHeather N Smart LCSW 05/17/2017, 3:38 PM

## 2017-05-17 NOTE — Progress Notes (Signed)
DAR NOTE: Pt present with flat affect and depressed mood in the unit. Pt has been isolating himself and has been bed most of the time. Pt stated he was having withdrawal  And did not want to be bothered. Pt complained of left wrist pain, took all his meds as scheduled. As per self inventory, pt had a poor night sleep, fair appetite, low energy, and poor concentration. Pt rate depression at 8, hopeless ness at 7, and anxiety at 10. Pt's safety ensured with 15 minute and environmental checks. Pt currently denies SI/HI and A/V hallucinations. Pt verbally agrees to seek staff if SI/HI or A/VH occurs and to consult with staff before acting on these thoughts. Will continue POC.

## 2017-05-17 NOTE — Plan of Care (Signed)
Problem: Medication: Goal: Compliance with prescribed medication regimen will improve Outcome: Progressing Pt compliant with medication regime this shift.   

## 2017-05-17 NOTE — Progress Notes (Signed)
Recreation Therapy Notes  Date: 05/17/17 Time: 0930 Location: [address] Dayroom  Group Topic: Stress Management  Goal Area(s) Addresses:  Patient will verbalize importance of using healthy stress management.  Patient will identify positive emotions associated with healthy stress management.   Intervention: Stress Management  Activity :  Gratitude Meditation.  LRT introduced the stress management technique of meditation to patients.  LRT played a meditation from the Calm app to allow patients to engage in the technique.  Patients were to follow along as the meditation played to participate in the practice.  Education:  Stress Management, Discharge Planning.   Education Outcome: Acknowledges edcuation/In group clarification offered/Needs additional education  Clinical Observations/Feedback: Pt did not attend group.   Caroll RancherMarjette Shaniyah Wix, LRT/CTRS         Caroll RancherLindsay, Arlon Bleier A 05/17/2017 11:28 AM

## 2017-05-17 NOTE — Progress Notes (Signed)
[facility] MD Progress Note  05/17/2017 5:39 PM Chase Moss  MRN:  [id number] Subjective:   [name], divorced, unemployed. Lives between the shelter and motel with a friend. Known history of SUD. Presented via EMS. Self inflicted lacerations to his left forearm/wrist. Was threatening to throw self off the a bridge.  Reported to have expressed being depressed. Lost his sister early this year. UDS positive for cocaine and THC. States that he had stopped taking his medications a couple of weeks ago. He has been drinking and doing a lot of drugs.  Chart reviewed today. Patient discussed at team.  Staff reports that he came out a bit today. Very limited interaction. Slightly less irritable. No threatening remarks. Takes hismedications as prescribed.  Seen today. Feels better. Says he was outside today. Spoke to a friend in [place]. Says she would take him in when he gets stable. Says his mom side is from there. Says he has support there. Hopes to pick up a job there. Not considering rehab yet. I probed him about homicidal thoughts towards his brother inlaw. Says he was upset when he made the remark. Denies any intention to harm him. Denies any thoughts of violence. No hallucination in any modality. Not making any abnormal belief.   Principal Problem: Substance induced mood disorder (HCC) Diagnosis:   Patient Active Problem List   Diagnosis Date Noted  . 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 Cocaine abuse [F14.10] 03/31/2017  . Alcohol abuse [F10.10]   . HTN (hypertension) [I10] 06/20/2016  . Major depressive disorder, recurrent severe without psychotic features (HCC) [F33.2] 06/20/2016  . Tobacco use disorder [F17.200] 06/20/2016  . Trauma [T14.90XA] 07/27/2015  . Cannabis use disorder, moderate, dependence (HCC) [F12.20]   . PTSD (post-traumatic stress disorder) [F43.10]   . Substance induced mood disorder (HCC) [F19.94]  07/07/2014  . Alcohol use disorder, moderate, dependence (HCC) [F10.20] 04/10/2012  . Suicidal thoughts [R45.851] 04/04/2012   Total Time spent with patient: 20 minutes  Past Psychiatric History: As in H&P  Past Medical History:  Past Medical History:  Diagnosis Date  . Bipolar 1 disorder (HCC)   . Depression   . Hypertension   . PTSD (post-traumatic stress disorder)    History reviewed. No pertinent surgical history. Family History:  Family History  Problem Relation Age of Onset  . Heart attack Other    Family Psychiatric  History: As in H&P Social History:  History  Alcohol Use  . 1.2 oz/week  . 2 Cans of beer per week    Comment: daily alcohol     History  Drug Use  . Types: Cocaine, Marijuana    Comment: several times a day- pt reports hasn't used since detoxic     Social History   Social History  . Marital status: Divorced    Spouse name: N/A  . Number of children: N/A  . Years of education: N/A   Social History Main Topics  . Smoking status: Current Every Day Smoker    Packs/day: 1.00    Years: 30.00    Types: Cigarettes  . Smokeless tobacco: Never Used  . Alcohol use 1.2 oz/week    2 Cans of beer per week     Comment: daily alcohol  . Drug use: Yes    Types: Cocaine, Marijuana     Comment: several times a day- pt reports hasn't used since detoxic   . Sexual activity: Yes   Other Topics Concern  .  None   Social History Narrative  . None   Additional Social History:    Sleep: Fair  Appetite:  Fair  Current Medications: Current Facility-Administered Medications  Medication Dose Route Frequency Provider Last Rate Last Dose  . acetaminophen (TYLENOL) tablet 650 mg  650 mg Oral Q4H PRN Charm Rings, NP      . alum & mag hydroxide-simeth (MAALOX/MYLANTA) 200-200-20 MG/5ML suspension 30 mL  30 mL Oral PRN Charm Rings, NP      . ARIPiprazole (ABILIFY) tablet 20 mg  20 mg Oral Daily Dell Hurtubise, Delight Ovens, MD   20 mg at 05/17/17 2956  .  atenolol (TENORMIN) tablet 50 mg  50 mg Oral Daily Charm Rings, NP   50 mg at 05/17/17 2130  . bacitracin ointment   Topical BID Charm Rings, NP      . buPROPion (WELLBUTRIN XL) 24 hr tablet 150 mg  150 mg Oral Daily Georgiann Cocker, MD   150 mg at 05/17/17 8657  . ibuprofen (ADVIL,MOTRIN) tablet 600 mg  600 mg Oral Q8H PRN Charm Rings, NP   600 mg at 05/16/17 8469  . lisinopril (PRINIVIL,ZESTRIL) tablet 5 mg  5 mg Oral Daily Charm Rings, NP   5 mg at 05/17/17 6295  . LORazepam (ATIVAN) tablet 0-4 mg  0-4 mg Oral Q12H Charm Rings, NP   1 mg at 05/17/17 2841  . magnesium hydroxide (MILK OF MAGNESIA) suspension 30 mL  30 mL Oral Daily PRN Charm Rings, NP      . nicotine (NICODERM CQ - dosed in mg/24 hours) patch 21 mg  21 mg Transdermal Daily Lord, Jamison Y, NP      . ondansetron [facility]) tablet 4 mg  4 mg Oral Q8H PRN Charm Rings, NP      . pantoprazole (PROTONIX) EC tablet 40 mg  40 mg Oral Daily Charm Rings, NP   40 mg at 05/17/17 3244  . pneumococcal 23 valent vaccine (PNU-IMMUNE) injection 0.5 mL  0.5 mL Intramuscular Tomorrow-1000 Adonis Brook, NP      . pravastatin (PRAVACHOL) tablet 40 mg  40 mg Oral QPM Charm Rings, NP   40 mg at 05/17/17 1718  . prazosin (MINIPRESS) capsule 5 mg  5 mg Oral QHS Charm Rings, NP   5 mg at 05/16/17 2205  . thiamine (VITAMIN B-1) tablet 100 mg  100 mg Oral Daily Charm Rings, NP   100 mg at 05/17/17 0102   Or  . thiamine (B-1) injection 100 mg  100 mg Intravenous Daily Charm Rings, NP      . traZODone (DESYREL) tablet 100 mg  100 mg Oral QHS Georgiann Cocker, MD   100 mg at 05/16/17 2205    Lab Results:  No results found for this or any previous visit (from the past 48 hour(s)).  Blood Alcohol level:  Lab Results  Component Value Date   ETH 63 (H) 05/15/2017   ETH <5 04/02/2017    Metabolic Disorder Labs: Lab Results  Component Value Date   HGBA1C 5.2 06/21/2016   MPG 100 04/09/2012   Lab  Results  Component Value Date   PROLACTIN 21.0 (H) 06/21/2016   Lab Results  Component Value Date   CHOL 193 06/21/2016   TRIG 226 (H) 06/21/2016   HDL 60 06/21/2016   CHOLHDL 3.2 06/21/2016   VLDL 45 (H) 06/21/2016   LDLCALC 88 06/21/2016    Physical  Findings: AIMS: Facial and Oral Movements Muscles of Facial Expression: None, normal Lips and Perioral Area: None, normal Jaw: None, normal Tongue: None, normal,Extremity Movements Upper (arms, wrists, hands, fingers): None, normal Lower (legs, knees, ankles, toes): None, normal, Trunk Movements Neck, shoulders, hips: None, normal, Overall Severity Severity of abnormal movements (highest score from questions above): None, normal Incapacitation due to abnormal movements: None, normal Patient's awareness of abnormal movements (rate only patient's report): No Awareness, Dental Status Current problems with teeth and/or dentures?: No Does patient usually wear dentures?: No  CIWA:  CIWA-Ar Total: 1 COWS:  COWS Total Score: 1  Musculoskeletal: Strength & Muscle Tone: Unable to assess at this time.  Gait & Station: Unable to assess at this time.  Patient leans: Unable to assess at this time.   Psychiatric Specialty Exam: Physical Exam Unable to assess at this time.   ROS  Blood pressure 130/88, pulse 67, temperature 97.7 F (36.5 C), resp. rate 20, height 5\' 9"  (1.753 m), weight 79.4 kg (175 lb).Body mass index is 25.84 kg/m.  General Appearance: In bed, more engaging today. Smiled for the first time.   Eye Contact:  Good  Speech:  Clear and Coherent  Volume:  Normal  Mood:  Feeling better  Affect:  Congruent  Thought Process:  Linear  Orientation:  WNL   Thought Content:  Ruminations about the past. No thoughts of violence. No perceptual abnormality.   Suicidal Thoughts:  None today  Homicidal Thoughts:  None  Memory:  WNL  Judgement:  Better  Insight:  Fair  Psychomotor Activity:  Decreased  Concentration:  Impaired    Recall: Did not assess at this time.   Fund of Knowledge:  Did not assess today.   Language:  Good  Akathisia:  Negative  Handed:    AIMS (if indicated):     Assets:  Desire for Improvement  ADL's:  Impaired  Cognition:  Impaired,  Mild  Sleep:  Number of Hours: 5.25     Treatment Plan Summary: Patient is gradually coming of street drugs. He is tolerating his medication well. He is beginning to process future plans. Needs further evaluation  Psychiatric: SUD Substance Induced Mood Disorder  Medical: HTN  Psychosocial:  Homelessness Limited support Unemployed.   PLAN: 1. Abilify was increased to 20 mg today 2. Continue to monitor mood, behavior and interaction with peers 3. Encourage groups and unit activities.    Georgiann CockerVincent A Yohanna Tow, MD 05/17/2017, 5:39 PMPatient ID: Chase Moss, male   DOB: 10/13/1966, 51 y.o.   MRN: [id number]

## 2017-05-17 NOTE — Progress Notes (Signed)
Pt did not attend the evening AA speaker meeting. Pt stayed in his room in bed. Caswell Corwinwen, Kolson Chovanec C, NT 05/17/17 9:59 PM

## 2017-05-17 NOTE — [facility] Suicide Risk Assessment (Signed)
[facility] INPATIENT:  Family/Significant Other Suicide Prevention Education  Suicide Prevention Education:  Patient Refusal for Family/Significant Other Suicide Prevention Education: The patient Chase Moss has refused to provide written consent for family/significant other to be provided Family/Significant Other Suicide Prevention Education during admission and/or prior to discharge.  Physician notified.  SPE completed with pt, as pt refused to consent to family contact. SPI pamphlet provided to pt and pt was encouraged to share information with support network, ask questions, and talk about any concerns relating to SPE. Pt denies access to guns/firearms and verbalized understanding of information provided. Mobile Crisis information also provided to pt.   Chet Greenley N Smart LCSW 05/17/2017, 11:26 AM

## 2017-05-17 NOTE — Progress Notes (Signed)
Patient ID: Chase DowGregory Massimino, male   DOB: 10/26/1966, 51 y.o.   MRN: [id number]  D: Patient in bed awake on approach. Pt reports increase anxiety and depression symptoms. Pt rated them as 9 on 0-10 scale. Pt stated not feeling suicidal "at this time" and contracted to come to staff. Pt denies auditory and visual hallucination.    A: Medications administered as prescribed. Support and encouragement provided to attend groups and engage in milieu.     R: Patient is safe on the unit and complaint with medications.

## 2017-05-18 MED ORDER — BUPROPION HCL ER (XL) 300 MG PO TB24
300.0000 mg | ORAL_TABLET | Freq: Every day | ORAL | Status: DC
  Administered 2017-05-19 – 2017-05-22 (×4): 300 mg via ORAL
  Filled 2017-05-18 (×6): qty 1

## 2017-05-18 MED ORDER — POTASSIUM CHLORIDE CRYS ER 20 MEQ PO TBCR
20.0000 meq | EXTENDED_RELEASE_TABLET | Freq: Once | ORAL | Status: AC
  Administered 2017-05-18: 20 meq via ORAL
  Filled 2017-05-18 (×2): qty 1

## 2017-05-18 NOTE — Progress Notes (Signed)
Patient did attend the evening speaker AA meeting.  

## 2017-05-18 NOTE — Progress Notes (Signed)
[facility] MD Progress Note  05/18/2017 3:35 PM Chase Moss  MRN:  [id number] Subjective:   [name], divorced, unemployed. Lives between the shelter and motel with a friend. Known history of SUD. Presented via EMS. Self inflicted lacerations to his left forearm/wrist. Was threatening to throw self off the a bridge.  Reported to have expressed being depressed. Lost his sister early this year. UDS positive for cocaine and THC. States that he had stopped taking his medications a couple of weeks ago. He has been drinking and doing a lot of drugs.  Chart reviewed today. Patient discussed at team.  Staff reports that he continues to make progress. He is participating at some of the unit groups. Irritability is much less. He is beginning to groom self. Voluntarily requested to shave today. No behavioral issues.   Seen today. Maintains progress. Some lingering depression. Agrees to increase in Bupropion. Now processing what he did. Looked at his wound and expressed remorse. No thoughts of suicide. No thoughts of violence. No evidence of psychosis. No side effects from his medications.   Principal Problem: Substance induced mood disorder (HCC) Diagnosis:   Patient Active Problem List   Diagnosis Date Noted  . 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 Cocaine abuse [F14.10] 03/31/2017  . Alcohol abuse [F10.10]   . HTN (hypertension) [I10] 06/20/2016  . Major depressive disorder, recurrent severe without psychotic features (HCC) [F33.2] 06/20/2016  . Tobacco use disorder [F17.200] 06/20/2016  . Trauma [T14.90XA] 07/27/2015  . Cannabis use disorder, moderate, dependence (HCC) [F12.20]   . PTSD (post-traumatic stress disorder) [F43.10]   . Substance induced mood disorder (HCC) [F19.94] 07/07/2014  . Alcohol use disorder, moderate, dependence (HCC) [F10.20] 04/10/2012  . Suicidal thoughts [R45.851] 04/04/2012   Total Time spent with  patient: 20 minutes  Past Psychiatric History: As in H&P  Past Medical History:  Past Medical History:  Diagnosis Date  . Bipolar 1 disorder (HCC)   . Depression   . Hypertension   . PTSD (post-traumatic stress disorder)    History reviewed. No pertinent surgical history. Family History:  Family History  Problem Relation Age of Onset  . Heart attack Other    Family Psychiatric  History: As in H&P Social History:  History  Alcohol Use  . 1.2 oz/week  . 2 Cans of beer per week    Comment: daily alcohol     History  Drug Use  . Types: Cocaine, Marijuana    Comment: several times a day- pt reports hasn't used since detoxic     Social History   Social History  . Marital status: Divorced    Spouse name: N/A  . Number of children: N/A  . Years of education: N/A   Social History Main Topics  . Smoking status: Current Every Day Smoker    Packs/day: 1.00    Years: 30.00    Types: Cigarettes  . Smokeless tobacco: Never Used  . Alcohol use 1.2 oz/week    2 Cans of beer per week     Comment: daily alcohol  . Drug use: Yes    Types: Cocaine, Marijuana     Comment: several times a day- pt reports hasn't used since detoxic   . Sexual activity: Yes   Other Topics Concern  . None   Social History Narrative  . None   Additional Social History:    Sleep: Fair  Appetite:  Fair  Current Medications: Current Facility-Administered Medications  Medication Dose Route Frequency Provider Last Rate Last Dose  . acetaminophen (TYLENOL) tablet 650 mg  650 mg Oral Q4H PRN Charm RingsLord, Jamison Y, NP      . alum & mag hydroxide-simeth (MAALOX/MYLANTA) 200-200-20 MG/5ML suspension 30 mL  30 mL Oral PRN Charm RingsLord, Jamison Y, NP      . ARIPiprazole (ABILIFY) tablet 20 mg  20 mg Oral Daily Foy Mungia, Delight OvensVincent A, MD   20 mg at 05/18/17 0831  . atenolol (TENORMIN) tablet 50 mg  50 mg Oral Daily Charm RingsLord, Jamison Y, NP   50 mg at 05/18/17 0831  . bacitracin ointment   Topical BID Charm RingsLord, Jamison Y, NP       . buPROPion (WELLBUTRIN XL) 24 hr tablet 150 mg  150 mg Oral Daily Styles Fambro, Delight OvensVincent A, MD   150 mg at 05/18/17 0831  . ibuprofen (ADVIL,MOTRIN) tablet 600 mg  600 mg Oral Q8H PRN Charm RingsLord, Jamison Y, NP   600 mg at 05/16/17 [id number]  . lisinopril (PRINIVIL,ZESTRIL) tablet 5 mg  5 mg Oral Daily Charm RingsLord, Jamison Y, NP   5 mg at 05/18/17 0831  . LORazepam (ATIVAN) tablet 0-4 mg  0-4 mg Oral Q12H Charm RingsLord, Jamison Y, NP   1 mg at 05/17/17 2107  . LORazepam (ATIVAN) tablet 1 mg  1 mg Oral TID PRN Laveda AbbeParks, Laurie Britton, NP   1 mg at 05/18/17 1158  . magnesium hydroxide (MILK OF MAGNESIA) suspension 30 mL  30 mL Oral Daily PRN Charm RingsLord, Jamison Y, NP      . ondansetron [facility](ZOFRAN) tablet 4 mg  4 mg Oral Q8H PRN Charm RingsLord, Jamison Y, NP   4 mg at 05/18/17 1158  . pantoprazole (PROTONIX) EC tablet 40 mg  40 mg Oral Daily Charm RingsLord, Jamison Y, NP   40 mg at 05/18/17 0831  . pneumococcal 23 valent vaccine (PNU-IMMUNE) injection 0.5 mL  0.5 mL Intramuscular Tomorrow-1000 Adonis BrookAgustin, Sheila, NP      . potassium chloride SA (K-DUR,KLOR-CON) CR tablet 20 mEq  20 mEq Oral Once Aubrea Meixner A, MD      . pravastatin (PRAVACHOL) tablet 40 mg  40 mg Oral QPM Charm RingsLord, Jamison Y, NP   40 mg at 05/17/17 1718  . prazosin (MINIPRESS) capsule 5 mg  5 mg Oral QHS Charm RingsLord, Jamison Y, NP   5 mg at 05/17/17 2117  . thiamine (VITAMIN B-1) tablet 100 mg  100 mg Oral Daily Charm RingsLord, Jamison Y, NP   100 mg at 05/18/17 [id number]   Or  . thiamine (B-1) injection 100 mg  100 mg Intravenous Daily Charm RingsLord, Jamison Y, NP      . traZODone (DESYREL) tablet 100 mg  100 mg Oral QHS Georgiann CockerIzediuno, Guled Gahan A, MD   100 mg at 05/17/17 2117    Lab Results:  No results found for this or any previous visit (from the past 48 hour(s)).  Blood Alcohol level:  Lab Results  Component Value Date   ETH 63 (H) 05/15/2017   ETH <5 04/02/2017    Metabolic Disorder Labs: Lab Results  Component Value Date   HGBA1C 5.2 06/21/2016   MPG 100 04/09/2012   Lab Results  Component Value Date    PROLACTIN 21.0 (H) 06/21/2016   Lab Results  Component Value Date   CHOL 193 06/21/2016   TRIG 226 (H) 06/21/2016   HDL 60 06/21/2016   CHOLHDL 3.2 06/21/2016   VLDL 45 (H) 06/21/2016   LDLCALC 88 06/21/2016    Physical Findings: AIMS: Facial and Oral Movements  Muscles of Facial Expression: None, normal Lips and Perioral Area: None, normal Jaw: None, normal Tongue: None, normal,Extremity Movements Upper (arms, wrists, hands, fingers): None, normal Lower (legs, knees, ankles, toes): None, normal, Trunk Movements Neck, shoulders, hips: None, normal, Overall Severity Severity of abnormal movements (highest score from questions above): None, normal Incapacitation due to abnormal movements: None, normal Patient's awareness of abnormal movements (rate only patient's report): No Awareness, Dental Status Current problems with teeth and/or dentures?: No Does patient usually wear dentures?: No  CIWA:  CIWA-Ar Total: 2 COWS:  COWS Total Score: 1  Musculoskeletal: Strength & Muscle Tone: Unable to assess at this time.  Gait & Station: Unable to assess at this time.  Patient leans: Unable to assess at this time.   Psychiatric Specialty Exam: Physical Exam Unable to assess at this time.   ROS  Blood pressure 131/79, pulse 70, temperature 97.7 F (36.5 C), temperature source Oral, resp. rate 18, height 5\' 9"  (1.753 m), weight 79.4 kg (175 lb).Body mass index is 25.84 kg/m.  General Appearance: Up and about today. Good rapport. Appropriate behavior  Eye Contact:  Good  Speech:  Clear and Coherent  Volume:  Normal  Mood:  Improving   Affect:  Mobilizing affect more  Thought Process:  Linear  Orientation:  WNL   Thought Content:  Future oriented. No thoughts of violence. No hallucination in any modality.    Suicidal Thoughts:  No  Homicidal Thoughts:  No  Memory:  WNL  Judgement:  Better  Insight:  Fair  Psychomotor Activity:  Better  Concentration:  Impaired   Recall: WNL    Fund of Knowledge:  Good   Language:  Good  Akathisia:  Negative  Handed:    AIMS (if indicated):     Assets:  Desire for Improvement  ADL's:  Impaired  Cognition:  Impaired,  Mild  Sleep:  Number of Hours: 6.25     Treatment Plan Summary: Patient is making good progress. We have agreed to optimize Wellbutrin today.   Psychiatric: SUD Substance Induced Mood Disorder  Medical: HTN  Psychosocial:  Homelessness Limited support Unemployed.   PLAN: 1. Increase Bupropion XL to 300 mg daily 2. Continue to monitor mood, behavior and interaction with peers 3. Encourage groups and unit activities.    Georgiann Cocker, MD 05/18/2017, 3:35 PMPatient ID: Chase Moss, male   DOB: [birth year], 51 y.o.   MRN: [id number] Patient ID: Chase Moss, male   DOB: 06-May-1966, 51 y.o.   MRN: [id number]

## 2017-05-18 NOTE — Progress Notes (Signed)
Adult Psychoeducational Group Note  Date:  05/18/2017 Time: 1300  Group Topic/Focus:  Healthy Coping skills  Participation Level:  Active  Participation Quality:  Appropriate  Affect:  Appropriate  Cognitive:  Appropriate  Insight: Appropriate  Engagement in Group:  Engaged  Modes of Intervention:  Discussion and Education  Additional Comments:    [place] Kestner L 05/18/2017, 4:20 PM 

## 2017-05-18 NOTE — Progress Notes (Signed)
Patient ID: Chase Moss, male   DOB: 03/11/1966, 51 y.o.   MRN: [id number]  DAR: Pt. Denies SI/HI and A/V Hallucinations at time of assessment however reports his thoughts of SI occur sometimes. He is able to contract for safety. Support and encouragement provided to the patient. Scheduled and PRN medications administered to patient per physician's orders. Patient reported some tremors, anxiety, nausea, irritability, and chilling as symptoms of his withdrawal. Patient reports, "I'm going to try to sleep some of it off." He reports his goal is, "to try and find value in life and facing more days." Patient is minimal with Clinical research associatewriter but cooperative. He is seen in the milieu intermittently. Q15 minute checks are maintained for safety.

## 2017-05-18 NOTE — [facility] Group Notes (Signed)
[facility] LCSW Group Therapy Note  05/18/2017  and  10:00 AM  Type of Therapy and Topic:  Group Therapy: Avoiding Self-Sabotaging and Enabling Behaviors  Participation Level:  Did Not Attend; invited to participate yet did not despite overhead announcement and encouragement by staff  Santos Hardwick C Shubh Chiara, LCSW  

## 2017-05-18 NOTE — Progress Notes (Signed)
Writer spoke with patient 1:1 at medication window. He came c/o tremors and reported that he needed ativan. Writer informed him of the last dose given was just at 1 1/2 hours ago. We agreed on a time to take his last prn of ativan and he would use his coping skills until then. He reports that his day was terrible because he was tired of not wanting to wake up. Writer encouraged him to try and be out of his room as much as possible and to interact more with his peers when possible. Support given and safety maintained on unit with 15 min checks.

## 2017-05-19 NOTE — Progress Notes (Signed)
[facility] MD Progress Note  05/19/2017 3:48 PM Chase Moss  MRN:  [id number] Subjective:   [name], divorced, unemployed. Lives between the shelter and motel with a friend. Known history of SUD. Presented via EMS. Self inflicted lacerations to his left forearm/wrist. Was threatening to throw self off the a bridge.  Reported to have expressed being depressed. Lost his sister early this year. UDS positive for cocaine and THC. States that he had stopped taking his medications a couple of weeks ago. He has been drinking and doing a lot of drugs.  Chart reviewed today. Patient discussed at team.  Staff reports that he has limited interactions today. Has been in bed. Reported nightmares about using and his sister when she was alive. Patient has not expressed any futility thoughts. He is tolerating his medications ell.  Seen today. Reports poor sleep last night. His roommate was noisy last night. Says he had interrupted sleep. Nightmares likely related to this. Tired and wants to rest today. No suicidal thoughts. No thoughts of violence. Says he has also been having "use dreams". Reassured it is normal and rehab would help work on those. She would alert his friend in [place] about [place] calling tomorrow.   Principal Problem: Substance induced mood disorder (HCC) Diagnosis:   Patient Active Problem List   Diagnosis Date Noted  . 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 Cocaine abuse [F14.10] 03/31/2017  . Alcohol abuse [F10.10]   . HTN (hypertension) [I10] 06/20/2016  . Major depressive disorder, recurrent severe without psychotic features (HCC) [F33.2] 06/20/2016  . Tobacco use disorder [F17.200] 06/20/2016  . Trauma [T14.90XA] 07/27/2015  . Cannabis use disorder, moderate, dependence (HCC) [F12.20]   . PTSD (post-traumatic stress disorder) [F43.10]   . Substance induced mood disorder (HCC) [F19.94] 07/07/2014  . Alcohol use disorder,  moderate, dependence (HCC) [F10.20] 04/10/2012  . Suicidal thoughts [R45.851] 04/04/2012   Total Time spent with patient: 20 minutes  Past Psychiatric History: As in H&P  Past Medical History:  Past Medical History:  Diagnosis Date  . Bipolar 1 disorder (HCC)   . Depression   . Hypertension   . PTSD (post-traumatic stress disorder)    History reviewed. No pertinent surgical history. Family History:  Family History  Problem Relation Age of Onset  . Heart attack Other    Family Psychiatric  History: As in H&P Social History:  History  Alcohol Use  . 1.2 oz/week  . 2 Cans of beer per week    Comment: daily alcohol     History  Drug Use  . Types: Cocaine, Marijuana    Comment: several times a day- pt reports hasn't used since detoxic     Social History   Social History  . Marital status: Divorced    Spouse name: N/A  . Number of children: N/A  . Years of education: N/A   Social History Main Topics  . Smoking status: Current Every Day Smoker    Packs/day: 1.00    Years: 30.00    Types: Cigarettes  . Smokeless tobacco: Never Used  . Alcohol use 1.2 oz/week    2 Cans of beer per week     Comment: daily alcohol  . Drug use: Yes    Types: Cocaine, Marijuana     Comment: several times a day- pt reports hasn't used since detoxic   . Sexual activity: Yes   Other Topics Concern  . None   Social History Narrative  .  None   Additional Social History:    Sleep: Fair  Appetite:  Fair  Current Medications: Current Facility-Administered Medications  Medication Dose Route Frequency Provider Last Rate Last Dose  . acetaminophen (TYLENOL) tablet 650 mg  650 mg Oral Q4H PRN Charm Rings, NP      . alum & mag hydroxide-simeth (MAALOX/MYLANTA) 200-200-20 MG/5ML suspension 30 mL  30 mL Oral PRN Charm Rings, NP      . ARIPiprazole (ABILIFY) tablet 20 mg  20 mg Oral Daily Izediuno, Delight Ovens, MD   20 mg at 05/19/17 0819  . atenolol (TENORMIN) tablet 50 mg  50 mg  Oral Daily Charm Rings, NP   50 mg at 05/19/17 0820  . bacitracin ointment   Topical BID Charm Rings, NP      . buPROPion (WELLBUTRIN XL) 24 hr tablet 300 mg  300 mg Oral Daily Izediuno, Delight Ovens, MD   300 mg at 05/19/17 0820  . ibuprofen (ADVIL,MOTRIN) tablet 600 mg  600 mg Oral Q8H PRN Charm Rings, NP   600 mg at 05/18/17 1955  . lisinopril (PRINIVIL,ZESTRIL) tablet 5 mg  5 mg Oral Daily Charm Rings, NP   5 mg at 05/19/17 1610  . LORazepam (ATIVAN) tablet 1 mg  1 mg Oral TID PRN Laveda Abbe, NP   1 mg at 05/18/17 2131  . magnesium hydroxide (MILK OF MAGNESIA) suspension 30 mL  30 mL Oral Daily PRN Charm Rings, NP      . ondansetron [facility]) tablet 4 mg  4 mg Oral Q8H PRN Charm Rings, NP   4 mg at 05/18/17 1158  . pantoprazole (PROTONIX) EC tablet 40 mg  40 mg Oral Daily Charm Rings, NP   40 mg at 05/19/17 9604  . pneumococcal 23 valent vaccine (PNU-IMMUNE) injection 0.5 mL  0.5 mL Intramuscular Tomorrow-1000 Adonis Brook, NP      . pravastatin (PRAVACHOL) tablet 40 mg  40 mg Oral QPM Charm Rings, NP   40 mg at 05/18/17 1812  . prazosin (MINIPRESS) capsule 5 mg  5 mg Oral QHS Charm Rings, NP   5 mg at 05/18/17 2131  . thiamine (VITAMIN B-1) tablet 100 mg  100 mg Oral Daily Charm Rings, NP   100 mg at 05/19/17 5409   Or  . thiamine (B-1) injection 100 mg  100 mg Intravenous Daily Charm Rings, NP      . traZODone (DESYREL) tablet 100 mg  100 mg Oral QHS Georgiann Cocker, MD   100 mg at 05/18/17 2131    Lab Results:  No results found for this or any previous visit (from the past 48 hour(s)).  Blood Alcohol level:  Lab Results  Component Value Date   ETH 63 (H) 05/15/2017   ETH <5 04/02/2017    Metabolic Disorder Labs: Lab Results  Component Value Date   HGBA1C 5.2 06/21/2016   MPG 100 04/09/2012   Lab Results  Component Value Date   PROLACTIN 21.0 (H) 06/21/2016   Lab Results  Component Value Date   CHOL 193 06/21/2016    TRIG 226 (H) 06/21/2016   HDL 60 06/21/2016   CHOLHDL 3.2 06/21/2016   VLDL 45 (H) 06/21/2016   LDLCALC 88 06/21/2016    Physical Findings: AIMS: Facial and Oral Movements Muscles of Facial Expression: None, normal Lips and Perioral Area: None, normal Jaw: None, normal Tongue: None, normal,Extremity Movements Upper (arms, wrists, hands, fingers):  None, normal Lower (legs, knees, ankles, toes): None, normal, Trunk Movements Neck, shoulders, hips: None, normal, Overall Severity Severity of abnormal movements (highest score from questions above): None, normal Incapacitation due to abnormal movements: None, normal Patient's awareness of abnormal movements (rate only patient's report): No Awareness, Dental Status Current problems with teeth and/or dentures?: No Does patient usually wear dentures?: No  CIWA:  CIWA-Ar Total: 1 COWS:  COWS Total Score: 1  Musculoskeletal: Strength & Muscle Tone: Unable to assess at this time.  Gait & Station: Unable to assess at this time.  Patient leans: Unable to assess at this time.   Psychiatric Specialty Exam: Physical Exam Unable to assess at this time.   ROS  Blood pressure 137/79, pulse 61, temperature 97.9 F (36.6 C), temperature source Oral, resp. rate (!) 22, height 5\' 9"  (1.753 m), weight 79.4 kg (175 lb).Body mass index is 25.84 kg/m.  General Appearance: In bed, withdrawn. Good rapport. Appropriate.   Eye Contact:  Good  Speech:  Clear and Coherent  Volume:  Normal  Mood:  Feels tired today.   Affect:  Restricted but appropriate   Thought Process:  Linear  Orientation:  WNL   Thought Content:  Future oriented. No thoughts of violence. No hallucination in any modality.    Suicidal Thoughts:  No  Homicidal Thoughts:  No  Memory:  WNL  Judgement:  Good  Insight:  Fair  Psychomotor Activity:  Better  Concentration:  Impaired   Recall: WNL   Fund of Knowledge:  Good   Language:  Good  Akathisia:  Negative  Handed:    AIMS  (if indicated):     Assets:  Desire for Improvement  ADL's:  Impaired  Cognition:  Impaired,  Mild  Sleep:  Number of Hours: 4.5     Treatment Plan Summary: Patient is making good progress. We made adjustments yesterday. We would evaluate him further. Hopeful discharge later in the week.   Psychiatric: SUD Substance Induced Mood Disorder  Medical: HTN  Psychosocial:  Homelessness Limited support Unemployed.   PLAN: 1.  Continue current regimen 2. Continue to monitor mood, behavior and interaction with peers 3. Encourage groups and unit activities.    Georgiann CockerVincent A Izediuno, MD 05/19/2017, 3:48 PMPatient ID: Chase Moss, male   DOB: 03/05/1966, 51 y.o.   MRN: [id number] Patient ID: Chase Moss, male   DOB: 03/23/1966, 51 y.o.   MRN: [id number] Patient ID: Chase Moss, male   DOB: 08/18/1966, 51 y.o.   MRN: [id number]

## 2017-05-19 NOTE — Plan of Care (Signed)
Problem: Education: Goal: Utilization of techniques to improve thought processes will improve Outcome: Progressing Nurse discussed depression/coping skills with patient.    

## 2017-05-19 NOTE — Progress Notes (Signed)
Patient showered this afternoon.  Dressing changed, antibiotic applied to L wrist/arm.  No signs/symptoms of pain/distress/redness/drainage/swelling.

## 2017-05-19 NOTE — [facility] Group Notes (Signed)
D:  Patient's self inventory sheet, patient has poor sleep, sleep medication not helpful.  Fair appetite, low energy level, poor concentration.  Rated depression and hopeless 9, anxiety 8.  Withdrawals, tremors, chilling, cramping, irritability, sweats  SI, contracts for safety, no plan.  Physical problems, pain, wrist.  Worst pain in past 24 hours is #6, hand wrist, pain medication helpful.  Goal is to have meaning and desire to live.  Plans to face another day, and hope to not be lonely.  No discharge plans.  "no desire to live".   A:  Medications administered per MD orders. Emotional support and encouragement given patient. R:  Denied HI.  Denied visual hallucinations.  Does have SI off/on, no plan, contracts for safety.  Does hear voices, did not want to discuss.   Has stayed in bed this morning.

## 2017-05-19 NOTE — [facility] Group Notes (Addendum)
[facility] LCSW Group Therapy  05/19/2017  10 AM  Type of Therapy:  Group Therapy  Participation Level:  Did Not Attend; invited to participate yet did not despite overhead announcement and encouragement by staff    Catherine C Harrill, LCSW    

## 2017-05-19 NOTE — Progress Notes (Signed)
The focus of this group is to educate the patient on the purpose and policies of crisis stabilization and provide a format to answer questions about their admission.  The group details unit policies and expectations of patients while admitted.  Patient did not attend 0900 nurse education orientation group this morning.  Patient stayed in room.  

## 2017-05-19 NOTE — Progress Notes (Signed)
[facility] Group Notes:  (Nursing/MHT/Case Management/Adjunct)  Date:  05/19/2017  Time:  2045  Type of Therapy:  wrap up group  Participation Level:  Active  Participation Quality:  Appropriate, Attentive, Sharing and Supportive  Affect:  Appropriate  Cognitive:  Appropriate  Insight:  Improving  Engagement in Group:  Engaged  Modes of Intervention:  Clarification, Education and Support  Summary of Progress/Problems: Pt shared that he tends to isolate and really cut off friends and contacts after mother and sister passed away. Pt shares that he would like a different outlook, pt feels bored with life.  Pt reports having PTSD and would like to see a therapist regularly but reports insurance and medicaid only covers 3 sessions a year. Pt reports having lived on sisters property but would like to relocate maybe to treatment program in [place] or living situation talked over with Child psychotherapistsocial worker.    Marcille BuffyMcNeil, Bobbijo Holst S 05/19/2017, 9:45 PM

## 2017-05-20 NOTE — Plan of Care (Signed)
Problem: Coping: Goal: Ability to cope will improve Outcome: Progressing Nurse discussed suicidal thoughts and coping skills with patient
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
## 2017-05-20 NOTE — [facility] Group Notes (Signed)
[facility] LCSW Aftercare Discharge Planning Group Note   05/20/2017  8:45 AM  Participation Quality: Pt invited. Did not attend.  Daijon Wenke B Darden Flemister, MSW, LCSWA 05/20/2017 1:15 PM   

## 2017-05-20 NOTE — Progress Notes (Signed)
Pt attended AA group. Chase Moss 05/20/2017  23:55

## 2017-05-20 NOTE — Progress Notes (Signed)
Writer spoke with patient 1:1 when he came to request his medications along with an ativan. He reports that he has not felt the best today and as just been very tired. He reported that he was going to watch some of the basketball game and lie down.Fluids given along with support. Safety maintained on unit with 15 min checks.

## 2017-05-20 NOTE — Progress Notes (Signed)
[facility] MD Progress Note  05/20/2017 4:35 PM Chase Moss  MRN:  [id number] Subjective:   [name], divorced, unemployed. Lives between the shelter and motel with a friend. Known history of SUD. Presented via EMS. Self inflicted lacerations to his left forearm/wrist. Was threatening to throw self off the a bridge.  Reported to have expressed being depressed. Lost his sister early this year. UDS positive for cocaine and THC. States that he had stopped taking his medications a couple of weeks ago. He has been drinking and doing a lot of drugs.  Chart reviewed today. Patient discussed at team.  Staff reports that he is isolating less. He has been out a couple of times today. Has not voiced any suicidal or homicidal thoughts. No behavioral issues. Takes his medications as prescribed.   Seen today. Focused on relocating to [place] soon. Would be working with the person he would stay with there. Says he still feels tired. He is no longer having suicidal thoughts. No hallucinations or delusions. Feels that once he has his life back on track he would do well. No craving for substances. Hopeful about being discharged later this week.   Principal Problem: Substance induced mood disorder (HCC) Diagnosis:   Patient Active Problem List   Diagnosis Date Noted  . 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 Cocaine abuse [F14.10] 03/31/2017  . Alcohol abuse [F10.10]   . HTN (hypertension) [I10] 06/20/2016  . Major depressive disorder, recurrent severe without psychotic features (HCC) [F33.2] 06/20/2016  . Tobacco use disorder [F17.200] 06/20/2016  . Trauma [T14.90XA] 07/27/2015  . Cannabis use disorder, moderate, dependence (HCC) [F12.20]   . PTSD (post-traumatic stress disorder) [F43.10]   . Substance induced mood disorder (HCC) [F19.94] 07/07/2014  . Alcohol use disorder, moderate, dependence (HCC) [F10.20] 04/10/2012  . Suicidal thoughts  [R45.851] 04/04/2012   Total Time spent with patient: 20 minutes  Past Psychiatric History: As in H&P  Past Medical History:  Past Medical History:  Diagnosis Date  . Bipolar 1 disorder (HCC)   . Depression   . Hypertension   . PTSD (post-traumatic stress disorder)    History reviewed. No pertinent surgical history. Family History:  Family History  Problem Relation Age of Onset  . Heart attack Other    Family Psychiatric  History: As in H&P Social History:  History  Alcohol Use  . 1.2 oz/week  . 2 Cans of beer per week    Comment: daily alcohol     History  Drug Use  . Types: Cocaine, Marijuana    Comment: several times a day- pt reports hasn't used since detoxic     Social History   Social History  . Marital status: Divorced    Spouse name: N/A  . Number of children: N/A  . Years of education: N/A   Social History Main Topics  . Smoking status: Current Every Day Smoker    Packs/day: 1.00    Years: 30.00    Types: Cigarettes  . Smokeless tobacco: Never Used  . Alcohol use 1.2 oz/week    2 Cans of beer per week     Comment: daily alcohol  . Drug use: Yes    Types: Cocaine, Marijuana     Comment: several times a day- pt reports hasn't used since detoxic   . Sexual activity: Yes   Other Topics Concern  . None   Social History Narrative  . None   Additional Social History:  Sleep: Fair  Appetite:  Fair  Current Medications: Current Facility-Administered Medications  Medication Dose Route Frequency Provider Last Rate Last Dose  . acetaminophen (TYLENOL) tablet 650 mg  650 mg Oral Q4H PRN Charm Rings, NP      . alum & mag hydroxide-simeth (MAALOX/MYLANTA) 200-200-20 MG/5ML suspension 30 mL  30 mL Oral PRN Charm Rings, NP      . ARIPiprazole (ABILIFY) tablet 20 mg  20 mg Oral Daily Halsey Persaud, Delight Ovens, MD   20 mg at 05/20/17 0801  . atenolol (TENORMIN) tablet 50 mg  50 mg Oral Daily Charm Rings, NP   50 mg at 05/20/17 0801  . bacitracin  ointment   Topical BID Charm Rings, NP      . buPROPion (WELLBUTRIN XL) 24 hr tablet 300 mg  300 mg Oral Daily Tadeusz Stahl A, MD   300 mg at 05/20/17 0800  . ibuprofen (ADVIL,MOTRIN) tablet 600 mg  600 mg Oral Q8H PRN Charm Rings, NP   600 mg at 05/18/17 1955  . lisinopril (PRINIVIL,ZESTRIL) tablet 5 mg  5 mg Oral Daily Charm Rings, NP   5 mg at 05/20/17 0800  . LORazepam (ATIVAN) tablet 1 mg  1 mg Oral TID PRN Laveda Abbe, NP   1 mg at 05/20/17 1335  . magnesium hydroxide (MILK OF MAGNESIA) suspension 30 mL  30 mL Oral Daily PRN Charm Rings, NP      . ondansetron [facility]) tablet 4 mg  4 mg Oral Q8H PRN Charm Rings, NP   4 mg at 05/18/17 1158  . pantoprazole (PROTONIX) EC tablet 40 mg  40 mg Oral Daily Charm Rings, NP   40 mg at 05/20/17 0800  . pneumococcal 23 valent vaccine (PNU-IMMUNE) injection 0.5 mL  0.5 mL Intramuscular Tomorrow-1000 Adonis Brook, NP      . pravastatin (PRAVACHOL) tablet 40 mg  40 mg Oral QPM Charm Rings, NP   40 mg at 05/19/17 1828  . prazosin (MINIPRESS) capsule 5 mg  5 mg Oral QHS Charm Rings, NP   5 mg at 05/19/17 2118  . thiamine (VITAMIN B-1) tablet 100 mg  100 mg Oral Daily Charm Rings, NP   100 mg at 05/20/17 0800   Or  . thiamine (B-1) injection 100 mg  100 mg Intravenous Daily Charm Rings, NP      . traZODone (DESYREL) tablet 100 mg  100 mg Oral QHS Georgiann Cocker, MD   100 mg at 05/19/17 2118    Lab Results:  No results found for this or any previous visit (from the past 48 hour(s)).  Blood Alcohol level:  Lab Results  Component Value Date   ETH 63 (H) 05/15/2017   ETH <5 04/02/2017    Metabolic Disorder Labs: Lab Results  Component Value Date   HGBA1C 5.2 06/21/2016   MPG 100 04/09/2012   Lab Results  Component Value Date   PROLACTIN 21.0 (H) 06/21/2016   Lab Results  Component Value Date   CHOL 193 06/21/2016   TRIG 226 (H) 06/21/2016   HDL 60 06/21/2016   CHOLHDL 3.2  06/21/2016   VLDL 45 (H) 06/21/2016   LDLCALC 88 06/21/2016    Physical Findings: AIMS: Facial and Oral Movements Muscles of Facial Expression: None, normal Lips and Perioral Area: None, normal Jaw: None, normal Tongue: None, normal,Extremity Movements Upper (arms, wrists, hands, fingers): None, normal Lower (legs, knees, ankles, toes): None, normal,  Trunk Movements Neck, shoulders, hips: None, normal, Overall Severity Severity of abnormal movements (highest score from questions above): None, normal Incapacitation due to abnormal movements: None, normal Patient's awareness of abnormal movements (rate only patient's report): No Awareness, Dental Status Current problems with teeth and/or dentures?: No Does patient usually wear dentures?: No  CIWA:  CIWA-Ar Total: 1 COWS:  COWS Total Score: 1  Musculoskeletal: Strength & Muscle Tone: Unable to assess at this time.  Gait & Station: Unable to assess at this time.  Patient leans: Unable to assess at this time.   Psychiatric Specialty Exam: Physical Exam  Constitutional: He is oriented to person, place, and time. He appears well-developed and well-nourished.  HENT:  Head: Normocephalic and atraumatic.  Eyes: Conjunctivae are normal. Pupils are equal, round, and reactive to light.  Neck: Normal range of motion. Neck supple.  Cardiovascular: Normal rate.   Respiratory: Effort normal and breath sounds normal.  GI: Soft. Bowel sounds are normal.  Musculoskeletal: Normal range of motion.  Neurological: He is alert and oriented to person, place, and time.  Skin: Skin is warm and dry.  Psychiatric:  As above   Unable to assess at this time.   ROS  Blood pressure 137/80, pulse 60, temperature 97.6 F (36.4 C), temperature source Oral, resp. rate 16, height 5\' 9"  (1.753 m), weight 79.4 kg (175 lb).Body mass index is 25.84 kg/m.  General Appearance: Out today, calm and cooperative. Good rapport. Appropriate behavior. Not internally  stimulated.   Eye Contact:  Good  Speech:  Clear and Coherent  Volume:  Normal  Mood:  Improving   Affect:  Restricted mostly but able to mobilize some positive affect.   Thought Process:  Linear  Orientation:  WNL   Thought Content:  Future oriented. No thoughts of violence. No hallucination in any modality.    Suicidal Thoughts:  No  Homicidal Thoughts:  No  Memory:  WNL  Judgement:  Good  Insight:  Fair  Psychomotor Activity:  Better  Concentration:  Better  Recall: WNL   Fund of Knowledge:  Good   Language:  Good  Akathisia:  Negative  Handed:    AIMS (if indicated):     Assets:  Desire for Improvement  ADL's:  Impaired  Cognition:  Impaired,  Mild  Sleep:  Number of Hours: 5.75     Treatment Plan Summary: Patient is gradually improving. His spirits are beginning to lift. No longer having suicidal thoughts. More future oriented. SW would clarify his aftercare plans. Hopeful discharge at midweek.    Psychiatric: SUD Substance Induced Mood Disorder  Medical: HTN  Psychosocial:  Homelessness Limited support Unemployed.   PLAN: 1.  Continue current regimen 2. Continue to monitor mood, behavior and interaction with peers 3. Encourage groups and unit activities.  4. Hopeful discharge by Wednesday.    Georgiann CockerVincent A Gavino Fouch, MD 05/20/2017, 4:35 PMPatient ID: Chase Moss, male   DOB: 11/29/1966, 51 y.o.   MRN: [id number] Patient ID: Chase Moss, male   DOB: 10/04/1966, 51 y.o.   MRN: [id number] Patient ID: Chase Moss, male   DOB: 12/30/1965, 51 y.o.   MRN: [id number] Patient ID: Chase Moss, male   DOB: 12/23/1966, 51 y.o.   MRN: [id number]

## 2017-05-20 NOTE — Progress Notes (Signed)
D: Patient pleasant upon approach. Denies pain, SI/HI, AH/VH at this time. Rated depression and anxiety 0/10 on both. Seen socializing with peers on day room. Verbalizes no concern. No behavior issues noted.   A: Staff offered support and encouragement as needed. Due med given as ordered. Routine safety check maintained. Will continue to monitor patient.   R: Patient remains safe on unit

## 2017-05-20 NOTE — Progress Notes (Signed)
Recreation Therapy Notes  Date: 05/20/17 Time: 0930 Location: 300 Hall Dayroom  Group Topic: Stress Management  Goal Area(s) Addresses:  Patient will verbalize importance of using healthy stress management.  Patient will identify positive emotions associated with healthy stress management.   Intervention: Stress Management  Activity :  Guided Imagery.  LRT introduced the stress management concept of guided imagery.  LRT read a script to allow patients to engage in guided imagery.  Patients were to follow along to fully participate in the activity.  Education:  Stress Management, Discharge Planning.   Education Outcome: Acknowledges edcuation/In group clarification offered/Needs additional education  Clinical Observations/Feedback: Pt did not attend group.   Caroll RancherMarjette Ashleen Demma, LRT/CTRS         Caroll RancherLindsay, Tyrian Peart A 05/20/2017 11:47 AM

## 2017-05-21 DIAGNOSIS — I10 Essential (primary) hypertension: Secondary | ICD-10-CM

## 2017-05-21 DIAGNOSIS — E7800 Pure hypercholesterolemia, unspecified: Secondary | ICD-10-CM

## 2017-05-21 DIAGNOSIS — K219 Gastro-esophageal reflux disease without esophagitis: Secondary | ICD-10-CM

## 2017-05-21 MED ORDER — HYDROXYZINE HCL 25 MG PO TABS
25.0000 mg | ORAL_TABLET | ORAL | Status: DC | PRN
  Administered 2017-05-21 – 2017-05-22 (×2): 25 mg via ORAL
  Filled 2017-05-21 (×2): qty 1

## 2017-05-21 MED ORDER — MIRTAZAPINE 15 MG PO TBDP
ORAL_TABLET | ORAL | Status: AC
  Filled 2017-05-21: qty 1

## 2017-05-21 MED ORDER — MIRTAZAPINE 7.5 MG PO TABS
7.5000 mg | ORAL_TABLET | Freq: Every day | ORAL | Status: DC
  Administered 2017-05-21: 7.5 mg via ORAL
  Filled 2017-05-21 (×4): qty 1

## 2017-05-21 NOTE — Tx Team (Signed)
Interdisciplinary Treatment and Diagnostic Plan Update  05/21/2017 Time of Session: 0930 Chase DowGregory Moss MRN: [id number]  Principal Diagnosis: Substance induced mood disorder (HCC)  Secondary Diagnoses: Principal Problem:   Substance induced mood disorder (HCC) Active Problems:   Major depressive disorder, recurrent, severe with psychotic features (HCC)   Current Medications:  Current Facility-Administered Medications  Medication Dose Route Frequency Provider Last Rate Last Dose  . acetaminophen (TYLENOL) tablet 650 mg  650 mg Oral Q4H PRN Charm RingsLord, Jamison Y, NP      . alum & mag hydroxide-simeth (MAALOX/MYLANTA) 200-200-20 MG/5ML suspension 30 mL  30 mL Oral PRN Charm RingsLord, Jamison Y, NP      . ARIPiprazole (ABILIFY) tablet 20 mg  20 mg Oral Daily Izediuno, Delight OvensVincent A, MD   20 mg at 05/21/17 0846  . atenolol (TENORMIN) tablet 50 mg  50 mg Oral Daily Charm RingsLord, Jamison Y, NP   50 mg at 05/21/17 0845  . bacitracin ointment   Topical BID Charm RingsLord, Jamison Y, NP      . buPROPion (WELLBUTRIN XL) 24 hr tablet 300 mg  300 mg Oral Daily Georgiann CockerIzediuno, Vincent A, MD   300 mg at 05/21/17 0846  . ibuprofen (ADVIL,MOTRIN) tablet 600 mg  600 mg Oral Q8H PRN Charm RingsLord, Jamison Y, NP   600 mg at 05/18/17 1955  . lisinopril (PRINIVIL,ZESTRIL) tablet 5 mg  5 mg Oral Daily Charm RingsLord, Jamison Y, NP   5 mg at 05/21/17 0845  . magnesium hydroxide (MILK OF MAGNESIA) suspension 30 mL  30 mL Oral Daily PRN Charm RingsLord, Jamison Y, NP      . ondansetron [facility](ZOFRAN) tablet 4 mg  4 mg Oral Q8H PRN Charm RingsLord, Jamison Y, NP   4 mg at 05/18/17 1158  . pantoprazole (PROTONIX) EC tablet 40 mg  40 mg Oral Daily Charm RingsLord, Jamison Y, NP   40 mg at 05/21/17 0845  . pneumococcal 23 valent vaccine (PNU-IMMUNE) injection 0.5 mL  0.5 mL Intramuscular Tomorrow-1000 Adonis BrookAgustin, Sheila, NP      . pravastatin (PRAVACHOL) tablet 40 mg  40 mg Oral QPM Charm RingsLord, Jamison Y, NP   40 mg at 05/20/17 1705  . prazosin (MINIPRESS) capsule 5 mg  5 mg Oral QHS Charm RingsLord, Jamison Y, NP   5 mg at 05/20/17 2137   . thiamine (VITAMIN B-1) tablet 100 mg  100 mg Oral Daily Charm RingsLord, Jamison Y, NP   100 mg at 05/21/17 [id number]   Or  . thiamine (B-1) injection 100 mg  100 mg Intravenous Daily Charm RingsLord, Jamison Y, NP      . traZODone (DESYREL) tablet 100 mg  100 mg Oral QHS Georgiann CockerIzediuno, Vincent A, MD   100 mg at 05/20/17 2305   PTA Medications: No prescriptions prior to admission.    Patient Stressors: Loss of mother and sister to death Substance abuse  Patient Strengths: Average or above average intelligence Communication skills Motivation for treatment/growth  Treatment Modalities: Medication Management, Group therapy, Case management,  1 to 1 session with clinician, Psychoeducation, Recreational therapy.   Physician Treatment Plan for Primary Diagnosis: Substance induced mood disorder (HCC) Long Term Goal(s): Improvement in symptoms so as ready for discharge Improvement in symptoms so as ready for discharge   Short Term Goals: Ability to identify changes in lifestyle to reduce recurrence of condition will improve Ability to verbalize feelings will improve Ability to disclose and discuss suicidal ideas Ability to demonstrate self-control will improve Ability to identify and develop effective coping behaviors will improve Compliance with prescribed medications will improve Ability  to identify triggers associated with substance abuse/mental health issues will improve  Medication Management: Evaluate patient's response, side effects, and tolerance of medication regimen.  Therapeutic Interventions: 1 to 1 sessions, Unit Group sessions and Medication administration.  Evaluation of Outcomes: Progressing  Physician Treatment Plan for Secondary Diagnosis: Principal Problem:   Substance induced mood disorder (HCC) Active Problems:   Major depressive disorder, recurrent, severe with psychotic features (HCC)  Long Term Goal(s): Improvement in symptoms so as ready for discharge Improvement in symptoms so as  ready for discharge   Short Term Goals: Ability to identify changes in lifestyle to reduce recurrence of condition will improve Ability to verbalize feelings will improve Ability to disclose and discuss suicidal ideas Ability to demonstrate self-control will improve Ability to identify and develop effective coping behaviors will improve Compliance with prescribed medications will improve Ability to identify triggers associated with substance abuse/mental health issues will improve     Medication Management: Evaluate patient's response, side effects, and tolerance of medication regimen.  Therapeutic Interventions: 1 to 1 sessions, Unit Group sessions and Medication administration.  Evaluation of Outcomes: Progressing   RN Treatment Plan for Primary Diagnosis: Substance induced mood disorder (HCC) Long Term Goal(s): Knowledge of disease and therapeutic regimen to maintain health will improve  Short Term Goals: Ability to remain free from injury will improve, Ability to verbalize feelings will improve and Ability to disclose and discuss suicidal ideas  Medication Management: RN will administer medications as ordered by provider, will assess and evaluate patient's response and provide education to patient for prescribed medication. RN will report any adverse and/or side effects to prescribing provider.  Therapeutic Interventions: 1 on 1 counseling sessions, Psychoeducation, Medication administration, Evaluate responses to treatment, Monitor vital signs and CBGs as ordered, Perform/monitor CIWA, COWS, AIMS and Fall Risk screenings as ordered, Perform wound care treatments as ordered.  Evaluation of Outcomes: Progressing   LCSW Treatment Plan for Primary Diagnosis: Substance induced mood disorder (HCC) Long Term Goal(s): Safe transition to appropriate next level of care at discharge, Engage patient in therapeutic group addressing interpersonal concerns.  Short Term Goals: Engage patient in  aftercare planning with referrals and resources, Facilitate patient progression through stages of change regarding substance use diagnoses and concerns and Identify triggers associated with mental health/substance abuse issues  Therapeutic Interventions: Assess for all discharge needs, 1 to 1 time with Social worker, Explore available resources and support systems, Assess for adequacy in community support network, Educate family and significant other(s) on suicide prevention, Complete Psychosocial Assessment, Interpersonal group therapy.  Evaluation of Outcomes: Progressing   Progress in Treatment: Attending groups: No.  Participating in groups: No. Taking medication as prescribed: Yes. Toleration medication: Yes. Family/Significant other contact made: SPE completed with pt; pt declined to consent to family contact.  Patient understands diagnosis: Yes. Discussing patient identified problems/goals with staff: Yes. Medical problems stabilized or resolved: Yes. Denies suicidal/homicidal ideation: Yes Issues/concerns per patient self-inventory: No. Other: n/a   New problem(s) identified: No, Describe:  n/a  New Short Term/Long Term Goal(s): medication stabilization; mood stabilization; elimination of SI thoughts, development of comprehensive mental wellness/sobriety plan.   Discharge Plan or Barriers: Pt plans to return to motel for the time being; follow-up at [facility]. Pt declined inpatient referrals. [facility] information provided.   Reason for Continuation of Hospitalization: Depression Medication stabilization  Estimated Length of Stay: 3-5 days   Attendees: Patient: 05/21/2017 11:20 AM  Physician: Dr. Jackquline Berlin MD 05/21/2017 11:20 AM  Nursing: Peterson Ao  RN 05/21/2017 11:20 AM  RN Care Manager: Onnie Boer CM 05/21/2017 11:20 AM  Social Worker: Trula Slade, LCSW; Donnelly Stager LCSWA 05/21/2017 11:20 AM  Recreational Therapist: x 05/21/2017 11:20 AM   Other: Armandina Stammer NP; Gray Bernhardt NP 05/21/2017 11:20 AM  Other:  05/21/2017 11:20 AM  Other: 05/21/2017 11:20 AM    Scribe for Treatment Team: Ledell Peoples Smart, LCSW 05/21/2017 11:20 AM

## 2017-05-21 NOTE — [facility] Group Notes (Signed)
[facility] Group Notes:  (Nursing)  Date:  05/21/2017  Time:  1000 Type of Therapy:  Nurse Education  Participation Level:  Did Not Attend  Summary of Progress/Problems: Pt did not attend nursing group despite multiple verbal encouragement.   Elainah Rhyne 05/21/2017,1000 

## 2017-05-21 NOTE — Social Work (Signed)
Referred to [facility], is [place] resident.  Latoshia Monrroy, LCSW Lead Clinical Social Worker Phone:  [phone]  

## 2017-05-21 NOTE — Progress Notes (Signed)
D: Chase Moss A & O X3. Denies SI, HI, AVH and pain. Presents with flat affect and depressed mood. Chase Moss is guarded but forwards on interactions. Per Chase Moss "I did not sleep last night, I had nightmares, chills and sweats; like the first couple of days when I got here". "I also noticed last night was the first night I did not have any Ativan, not that I want it, I've passed that stage". Reports good appetite, good concentration and poor energy level on self inventory sheet. Rates his depression 6/10, hopelessness 7/10 and anxiety 8/10. A: All medications administered as per MD's orders, effects monitored. Informed Chase Moss of medication changes made by assigned NP. Sutures intact to left arm, no s/s of infection noted at site. Support and encouragement offered to Chase Moss. Q 15 minutes safety checks maintained without outburst.  R: Chase Moss visible in milieu at intervals during shift for meals and medications. Chase Moss is was in  agreement with medication changes when informed. Chase Moss did not attend groups as scheduled. Remains safe on and off unit.

## 2017-05-21 NOTE — Progress Notes (Signed)
Recreation Therapy Notes  Animal-Assisted Activity (AAA) Program Checklist/Progress Notes Patient Eligibility Criteria Checklist & Daily Group note for Rec TxIntervention  Date: 05/21/2017 Time: 2:55pm Location: 400 hall dayroom   AAA/T Program Assumption of Risk Form signed by Patient/ or Parent Legal Guardian Yes  Patient is free of allergies or sever asthma Yes  Patient reports no fear of animals Yes  Patient reports no history of cruelty to animals Yes  Patient understands his/her participation is voluntary Yes  Patient washes hands before animal contact Yes  Patient washes hands after animal contact Yes  Behavioral Response: Appropriate  Education:Hand Washing, Appropriate Animal Interaction   Education Outcome: Acknowledges education.   Clinical Observations/Feedback: Patient attended session and interacted appropriately with therapy dog and peers. Patient asked appropriate questions about therapy dog and his training. Patient shared stories about their pets at home with group.   Marvell Fullerachel Meyer, Recreational Therapy Intern        Marvell FullerRachel Meyer 05/21/2017 3:56 PM
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
## 2017-05-21 NOTE — Progress Notes (Signed)
Adult Psychoeducational Group Note  Date:  05/21/2017 Time:  11:15 PM  Group Topic/Focus:  Wrap-Up Group:   The focus of this group is to help patients review their daily goal of treatment and discuss progress on daily workbooks.  Participation Level:  Did Not Attend  Additional Comments:  Pt did not attend group, pt remained in bed. Pt encouraged to attend group.  Karleen HampshireFox, Waylon Koffler Brittini 05/21/2017, 11:15 PM

## 2017-05-22 DIAGNOSIS — F333 Major depressive disorder, recurrent, severe with psychotic symptoms: Principal | ICD-10-CM

## 2017-05-22 DIAGNOSIS — F17210 Nicotine dependence, cigarettes, uncomplicated: Secondary | ICD-10-CM

## 2017-05-22 DIAGNOSIS — F1290 Cannabis use, unspecified, uncomplicated: Secondary | ICD-10-CM

## 2017-05-22 DIAGNOSIS — F1490 Cocaine use, unspecified, uncomplicated: Secondary | ICD-10-CM

## 2017-05-22 MED ORDER — MIRTAZAPINE 7.5 MG PO TABS: 7.5000 mg | ORAL_TABLET | Freq: Every day | ORAL | 0 refills | Status: DC

## 2017-05-22 MED ORDER — PRAZOSIN HCL 5 MG PO CAPS: 5.0000 mg | ORAL_CAPSULE | Freq: Every day | ORAL | 0 refills | Status: DC

## 2017-05-22 MED ORDER — HYDROXYZINE HCL 25 MG PO TABS: 25.0000 mg | ORAL_TABLET | ORAL | 0 refills | Status: DC | PRN

## 2017-05-22 MED ORDER — ARIPIPRAZOLE 20 MG PO TABS: 20.0000 mg | ORAL_TABLET | Freq: Every day | ORAL | 0 refills | Status: DC

## 2017-05-22 MED ORDER — BUPROPION HCL ER (XL) 300 MG PO TB24: 300.0000 mg | ORAL_TABLET | Freq: Every day | ORAL | 0 refills | Status: DC

## 2017-05-22 MED ORDER — LISINOPRIL 5 MG PO TABS: 5.0000 mg | ORAL_TABLET | Freq: Every day | ORAL | 0 refills | Status: DC

## 2017-05-22 MED ORDER — PRAVASTATIN SODIUM 40 MG PO TABS: 40.0000 mg | ORAL_TABLET | Freq: Every evening | ORAL | 0 refills | Status: DC

## 2017-05-22 MED ORDER — BACITRACIN ZINC 500 UNIT/GM EX OINT: TOPICAL_OINTMENT | Freq: Two times a day (BID) | CUTANEOUS | 0 refills | Status: DC

## 2017-05-22 MED ORDER — ATENOLOL 50 MG PO TABS: 50.0000 mg | ORAL_TABLET | Freq: Every day | ORAL | 0 refills | Status: DC

## 2017-05-22 MED ORDER — PANTOPRAZOLE SODIUM 40 MG PO TBEC: 40.0000 mg | DELAYED_RELEASE_TABLET | Freq: Every day | ORAL | 0 refills | Status: DC

## 2017-05-22 NOTE — Progress Notes (Signed)
Recreation Therapy Notes  Date: 05/22/17 Time: 0930 Location: 300 Hall Dayroom  Group Topic: Stress Management  Goal Area(s) Addresses:  Patient will verbalize importance of using healthy stress management.  Patient will identify positive emotions associated with healthy stress management.   Intervention: Stress Management  Activity :  Body Scan Meditation.  LRT introduced the stress management technique of meditation.  LRT played a meditation to allow patients to take inventory of the sensations they are feeling in their body.  Patients were to follow along as the meditation was played to fully engage in the technique.  Education:  Stress Management, Discharge Planning.   Education Outcome: Acknowledges edcuation/In group clarification offered/Needs additional education  Clinical Observations/Feedback: Pt did not attend group.   Smith Potenza, LRT/CTRS         Sumaiya Arruda A 05/22/2017 11:31 AM 

## 2017-05-22 NOTE — Progress Notes (Signed)
Patient ID: Chase Moss, male   DOB: 04/12/1966, 51 y.o.   MRN: [id number]  DAR: Pt. Denies SI/HI and A/V Hallucinations. He reports sleep is poor, appetite is good, energy level is normal, and concentration is good. She rates depression 5/10, hopelessness 4/10, and concentration is 7/10. Patient does report some itching where his staples are, scheduled ointment was applied and clean non-adherent dressing was applied. Support and encouragement provided to the patient. Scheduled medications administered to patient per physician's orders. Patient is seen in the milieu and reports he is ready for discharge. Q15 minute checks are maintained for safety.

## 2017-05-22 NOTE — [facility] Suicide Risk Assessment (Signed)
[facility]   Principal Problem: Major depressive disorder, recurrent, severe with psychotic features [facility](HCC) Discharge Diagnoses:  Patient Active Problem List   Diagnosis Date Noted  . 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 Cocaine abuse [F14.10] 03/31/2017  . HTN (hypertension) [I10] 06/20/2016  . Major depressive disorder, recurrent severe without psychotic features (HCC) [F33.2] 06/20/2016  . Tobacco use disorder [F17.200] 06/20/2016  . Trauma [T14.90XA] 07/27/2015  . Cannabis use disorder, moderate, dependence (HCC) [F12.20]   . PTSD (post-traumatic stress disorder) [F43.10]   . Substance induced mood disorder (HCC) [F19.94] 07/07/2014  . Alcohol use disorder, moderate, dependence (HCC) [F10.20] 04/10/2012  . Suicidal thoughts [R45.851] 04/04/2012    Total Time spent with patient: 30 minutes  Musculoskeletal: Strength & Muscle Tone: within normal limits Gait & Station: normal Patient leans: Backward  Psychiatric Specialty Exam: Review of Systems  All other systems reviewed and are negative.   Blood pressure 113/80, pulse 69, temperature 97.7 F (36.5 C), temperature source Oral, resp. rate 18, height 5\' 9"  (1.753 m), weight 79.4 kg (175 lb).Body mass index is 25.84 kg/m.  General Appearance: Casual  Eye Contact::  Fair  Speech:  Normal Rate409  Volume:  Normal  Mood:  Euthymic  Affect:  Congruent  Thought Process:  Goal Directed and Descriptions of Associations: Intact  Orientation:  Full (Time, Place, and Person)  Thought Content:  Logical  Suicidal Thoughts:  No  Homicidal Thoughts:  No  Memory:  Immediate;   Fair Recent;   Fair Remote;   Fair  Judgement:  Fair  Insight:  Fair  Psychomotor Activity:  Normal  Concentration:  Fair  Recall:  [name]  Fund of Knowledge:Fair  Language: Fair  Akathisia:  No  Handed:  Right  AIMS (if indicated):     Assets:  Communication  Skills Desire for Improvement  Sleep:  Number of Hours: 6.25  Cognition: WNL  ADL's:  Intact   Mental Status Per Nursing Assessment::   On Admission:  Suicidal ideation indicated by patient, Suicide plan, Plan includes specific time, place, or method, Self-harm thoughts, Self-harm behaviors, Intention to act on suicide plan, Belief that plan would result in death  Demographic Factors:  Caucasian  Loss Factors: NA  Historical Factors: NA  Risk Reduction Factors:   Positive therapeutic relationship  Continued Clinical Symptoms:  Previous Psychiatric Diagnoses and Treatments  Cognitive Features That Contribute To Risk:  None    Suicide Risk:  Minimal: No identifiable suicidal ideation.  Patients presenting with no risk factors but with morbid ruminations; may be classified as minimal risk based on the severity of the depressive symptoms  Follow-up Information    [facility] Follow up on 05/22/2017.   Why:  Please go directly to agency at discharge to complete assessment/admission into program. [facility] Corporalracie Holt contact number: [phone]. Thank you. Contact information: [address] [place], [place] [address] Phone: [phone] Fax: [phone]          Plan Of Care/Follow-up recommendations:  Activity:  no restrictions Diet:  regular Tests:  as needed Other:  none  Svea Pusch, MD 05/22/2017, 9:16 AM

## 2017-05-22 NOTE — Progress Notes (Signed)
Patient ID: Chase Moss, male   DOB: 08/24/1966, [age] y.o.   MRN: [id number]  Discharge Note: Belongings returned to patient at time of discharge. Discharge instructions and medications were reviewed with patient. Patient verbalized understanding of both medications and discharge instructions. Patient discharged to lobby with two bus passes and directions to bus stop. Patient verbalized understanding and exited with no apparent distress. Q15 minute safety checks maintained until discharge.

## 2017-05-22 NOTE — Progress Notes (Signed)
  [facility] :  Will you be returning to the same living situation after discharge:  No.Pt accepted to [facility] program.  At discharge, do you have transportation home?: Yes,  bus pass in chart Do you have the ability to pay for your medications: Yes,  [facility]  Release of information consent forms completed and submitted to medical records by CSW.  Patient to Follow up at: Follow-up Information    [facility] Follow up on 05/22/2017.   Why:  Please go directly to agency at discharge to complete assessment/admission into program. [facility] contact number: [phone]. Thank you. Contact information: [address] [place], [place] [address] Phone: [phone] Fax: [phone]          Next level of care provider [facility] Link:no  Safety Planning and Suicide Prevention discussed: Yes,  SPE completed with pt; pt declined to consent to family contact. SPI pamphlet and Mobile Crisis information provided.  Have you used any form of tobacco in the last 30 days? (Cigarettes, Smokeless Tobacco, Cigars, and/or Pipes): Yes  Has patient been referred to the Quitline?: Patient refused referral  Patient has been referred for addiction treatment: Yes  Daina Cara N Smart LCSW 05/22/2017, 9:51 AM
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
## 2017-05-22 NOTE — Tx Team (Signed)
Interdisciplinary Treatment and Diagnostic Plan Update  05/22/2017 Time of Session: 0930 Chase Moss MRN: [id number]  Principal Diagnosis: Major depressive disorder, recurrent, severe with psychotic features [facility])  Secondary Diagnoses: Principal Problem:   Major depressive disorder, recurrent, severe with psychotic features ([place]) Active Problems:   Alcohol use disorder, moderate, dependence ([place])   Substance induced mood disorder (HCC)   Cannabis use disorder, moderate, dependence (HCC)   PTSD (post-traumatic stress disorder)   Current Medications:  Current Facility-Administered Medications  Medication Dose Route Frequency Provider Last Rate Last Dose  . acetaminophen (TYLENOL) tablet 650 mg  650 mg Oral Q4H PRN Patrecia Pour, NP      . alum & mag hydroxide-simeth (MAALOX/MYLANTA) 200-200-20 MG/5ML suspension 30 mL  30 mL Oral PRN Patrecia Pour, NP      . ARIPiprazole (ABILIFY) tablet 20 mg  20 mg Oral Daily Izediuno, Laruth Bouchard, MD   20 mg at 05/22/17 0851  . atenolol (TENORMIN) tablet 50 mg  50 mg Oral Daily Patrecia Pour, NP   50 mg at 05/21/17 0845  . bacitracin ointment   Topical BID Patrecia Pour, NP   1 application at 71/24/58 [phone]  . buPROPion (WELLBUTRIN XL) 24 hr tablet 300 mg  300 mg Oral Daily Izediuno, Laruth Bouchard, MD   300 mg at 05/22/17 0851  . hydrOXYzine (ATARAX/VISTARIL) tablet 25 mg  25 mg Oral Q4H PRN Lindell Spar I, NP   25 mg at 05/22/17 0018  . ibuprofen (ADVIL,MOTRIN) tablet 600 mg  600 mg Oral Q8H PRN Patrecia Pour, NP   600 mg at 05/18/17 1955  . lisinopril (PRINIVIL,ZESTRIL) tablet 5 mg  5 mg Oral Daily Patrecia Pour, NP   5 mg at 05/22/17 0851  . magnesium hydroxide (MILK OF MAGNESIA) suspension 30 mL  30 mL Oral Daily PRN Patrecia Pour, NP      . mirtazapine (REMERON) tablet 7.5 mg  7.5 mg Oral QHS Lindell Spar I, NP   7.5 mg at 05/21/17 2146  . ondansetron (ZOFRAN) tablet 4 mg  4 mg Oral Q8H PRN Patrecia Pour, NP   4 mg at 05/18/17 1158  .  pantoprazole (PROTONIX) EC tablet 40 mg  40 mg Oral Daily Patrecia Pour, NP   40 mg at 05/22/17 0851  . pneumococcal 23 valent vaccine (PNU-IMMUNE) injection 0.5 mL  0.5 mL Intramuscular Tomorrow-1000 Kerrie Buffalo, NP      . pravastatin (PRAVACHOL) tablet 40 mg  40 mg Oral QPM Patrecia Pour, NP   40 mg at 05/21/17 1705  . prazosin (MINIPRESS) capsule 5 mg  5 mg Oral QHS Patrecia Pour, NP   5 mg at 05/21/17 2145  . thiamine (VITAMIN B-1) tablet 100 mg  100 mg Oral Daily Patrecia Pour, NP   100 mg at 05/22/17 3382   Or  . thiamine (B-1) injection 100 mg  100 mg Intravenous Daily Patrecia Pour, NP       PTA Medications: No prescriptions prior to admission.    Patient Stressors: Loss of mother and sister to death Substance abuse  Patient Strengths: Average or above average intelligence Communication skills Motivation for treatment/growth  Treatment Modalities: Medication Management, Group therapy, Case management,  1 to 1 session with clinician, Psychoeducation, Recreational therapy.   Physician Treatment Plan for Primary Diagnosis: Major depressive disorder, recurrent, severe with psychotic features ([place]) Long Term Goal(s): Improvement in symptoms so as ready for discharge Improvement in symptoms so as  ready for discharge   Short Term Goals: Ability to identify changes in lifestyle to reduce recurrence of condition will improve Ability to verbalize feelings will improve Ability to disclose and discuss suicidal ideas Ability to demonstrate self-control will improve Ability to identify and develop effective coping behaviors will improve Compliance with prescribed medications will improve Ability to identify triggers associated with substance abuse/mental health issues will improve  Medication Management: Evaluate patient's response, side effects, and tolerance of medication regimen.  Therapeutic Interventions: 1 to 1 sessions, Unit Group sessions and Medication  administration.  Evaluation of Outcomes: Met  Physician Treatment Plan for Secondary Diagnosis: Principal Problem:   Major depressive disorder, recurrent, severe with psychotic features ([place]) Active Problems:   Alcohol use disorder, moderate, dependence (HCC)   Substance induced mood disorder (HCC)   Cannabis use disorder, moderate, dependence (HCC)   PTSD (post-traumatic stress disorder)  Long Term Goal(s): Improvement in symptoms so as ready for discharge Improvement in symptoms so as ready for discharge   Short Term Goals: Ability to identify changes in lifestyle to reduce recurrence of condition will improve Ability to verbalize feelings will improve Ability to disclose and discuss suicidal ideas Ability to demonstrate self-control will improve Ability to identify and develop effective coping behaviors will improve Compliance with prescribed medications will improve Ability to identify triggers associated with substance abuse/mental health issues will improve     Medication Management: Evaluate patient's response, side effects, and tolerance of medication regimen.  Therapeutic Interventions: 1 to 1 sessions, Unit Group sessions and Medication administration.  Evaluation of Outcomes: Met   RN Treatment Plan for Primary Diagnosis: Major depressive disorder, recurrent, severe with psychotic features ([place]) Long Term Goal(s): Knowledge of disease and therapeutic regimen to maintain health will improve  Short Term Goals: Ability to remain free from injury will improve, Ability to verbalize feelings will improve and Ability to disclose and discuss suicidal ideas  Medication Management: RN will administer medications as ordered by provider, will assess and evaluate patient's response and provide education to patient for prescribed medication. RN will report any adverse and/or side effects to prescribing provider.  Therapeutic Interventions: 1 on 1 counseling sessions,  Psychoeducation, Medication administration, Evaluate responses to treatment, Monitor vital signs and CBGs as ordered, Perform/monitor CIWA, COWS, AIMS and Fall Risk screenings as ordered, Perform wound care treatments as ordered.  Evaluation of Outcomes: Met   LCSW Treatment Plan for Primary Diagnosis: Major depressive disorder, recurrent, severe with psychotic features ([place]) Long Term Goal(s): Safe transition to appropriate next level of care at discharge, Engage patient in therapeutic group addressing interpersonal concerns.  Short Term Goals: Engage patient in aftercare planning with referrals and resources, Facilitate patient progression through stages of change regarding substance use diagnoses and concerns and Identify triggers associated with mental health/substance abuse issues  Therapeutic Interventions: Assess for all discharge needs, 1 to 1 time with Social worker, Explore available resources and support systems, Assess for adequacy in community support network, Educate family and significant other(s) on suicide prevention, Complete Psychosocial Assessment, Interpersonal group therapy.  Evaluation of Outcomes: Met   Progress in Treatment: Attending groups: No.  Participating in groups: No. Taking medication as prescribed: Yes. Toleration medication: Yes. Family/Significant other contact made: SPE completed with pt; pt declined to consent to family contact.  Patient understands diagnosis: Yes. Discussing patient identified problems/goals with staff: Yes. Medical problems stabilized or resolved: Yes. Denies suicidal/homicidal ideation: Yes Issues/concerns per patient self-inventory: No. Other: n/a   New problem(s) identified: No, Describe:  n/a  New Short Term/Long Term Goal(s): medication stabilization; mood stabilization; elimination of SI thoughts, development of comprehensive mental wellness/sobriety plan.   Discharge Plan or Barriers: Pt accepted to [facility].   Reason for Continuation of Hospitalization: none  Estimated Length of Stay: discharge today   Attendees: Patient: 05/22/2017 9:52 AM  Physician: Dr. Shea Evans MD 05/22/2017 9:52 AM  Nursing:  Trinna Post; Patrice RN 05/22/2017 9:52 AM  RN Care Manager: [name] Pinks CM 05/22/2017 9:52 AM  Social Worker: Press photographer, LCSW; Matthew Saras LCSWA 05/22/2017 9:52 AM  Recreational Therapist: x 05/22/2017 9:52 AM  Other: Lindell Spar NP; Samuel Jester NP 05/22/2017 9:52 AM  Other:  05/22/2017 9:52 AM  Other: 05/22/2017 9:52 AM    Scribe for Treatment Team: [place], LCSW 05/22/2017 9:52 AM

## 2017-10-22 ENCOUNTER — Emergency Department (HOSPITAL_COMMUNITY): Payer: Medicaid Other

## 2017-10-22 ENCOUNTER — Encounter (HOSPITAL_COMMUNITY): Payer: Self-pay

## 2017-10-22 ENCOUNTER — Inpatient Hospital Stay (HOSPITAL_COMMUNITY)
Admission: EM | Admit: 2017-10-22 | Discharge: 2017-10-23 | DRG: 894 | Discharge status: Against Medical Advice | Payer: Medicaid Other | Attending: Internal Medicine | Admitting: Internal Medicine

## 2017-10-22 DIAGNOSIS — F10239 Alcohol dependence with withdrawal, unspecified: Secondary | ICD-10-CM | POA: Diagnosis present

## 2017-10-22 DIAGNOSIS — K2971 Gastritis, unspecified, with bleeding: Secondary | ICD-10-CM | POA: Diagnosis present

## 2017-10-22 DIAGNOSIS — R45851 Suicidal ideations: Secondary | ICD-10-CM | POA: Diagnosis present

## 2017-10-22 DIAGNOSIS — E876 Hypokalemia: Secondary | ICD-10-CM | POA: Diagnosis present

## 2017-10-22 DIAGNOSIS — F1910 Other psychoactive substance abuse, uncomplicated: Secondary | ICD-10-CM | POA: Insufficient documentation

## 2017-10-22 DIAGNOSIS — R531 Weakness: Secondary | ICD-10-CM | POA: Diagnosis not present

## 2017-10-22 DIAGNOSIS — F17210 Nicotine dependence, cigarettes, uncomplicated: Secondary | ICD-10-CM | POA: Diagnosis present

## 2017-10-22 DIAGNOSIS — Z5321 Procedure and treatment not carried out due to patient leaving prior to being seen by health care provider: Secondary | ICD-10-CM | POA: Diagnosis present

## 2017-10-22 DIAGNOSIS — I10 Essential (primary) hypertension: Secondary | ICD-10-CM | POA: Diagnosis present

## 2017-10-22 DIAGNOSIS — F10920 Alcohol use, unspecified with intoxication, uncomplicated: Secondary | ICD-10-CM

## 2017-10-22 DIAGNOSIS — F419 Anxiety disorder, unspecified: Secondary | ICD-10-CM | POA: Diagnosis present

## 2017-10-22 DIAGNOSIS — F314 Bipolar disorder, current episode depressed, severe, without psychotic features: Secondary | ICD-10-CM | POA: Diagnosis not present

## 2017-10-22 DIAGNOSIS — R112 Nausea with vomiting, unspecified: Secondary | ICD-10-CM | POA: Diagnosis not present

## 2017-10-22 DIAGNOSIS — Y905 Blood alcohol level of 100-119 mg/100 ml: Secondary | ICD-10-CM | POA: Diagnosis present

## 2017-10-22 DIAGNOSIS — F1010 Alcohol abuse, uncomplicated: Secondary | ICD-10-CM | POA: Diagnosis present

## 2017-10-22 DIAGNOSIS — Z9119 Patient's noncompliance with other medical treatment and regimen: Secondary | ICD-10-CM | POA: Diagnosis not present

## 2017-10-22 DIAGNOSIS — F1220 Cannabis dependence, uncomplicated: Secondary | ICD-10-CM | POA: Diagnosis present

## 2017-10-22 DIAGNOSIS — K2970 Gastritis, unspecified, without bleeding: Secondary | ICD-10-CM

## 2017-10-22 DIAGNOSIS — R61 Generalized hyperhidrosis: Secondary | ICD-10-CM | POA: Diagnosis not present

## 2017-10-22 DIAGNOSIS — F10939 Alcohol use, unspecified with withdrawal, unspecified: Secondary | ICD-10-CM | POA: Diagnosis present

## 2017-10-22 DIAGNOSIS — Z915 Personal history of self-harm: Secondary | ICD-10-CM | POA: Diagnosis not present

## 2017-10-22 DIAGNOSIS — R109 Unspecified abdominal pain: Secondary | ICD-10-CM | POA: Diagnosis not present

## 2017-10-22 DIAGNOSIS — F10229 Alcohol dependence with intoxication, unspecified: Principal | ICD-10-CM | POA: Diagnosis present

## 2017-10-22 DIAGNOSIS — F319 Bipolar disorder, unspecified: Secondary | ICD-10-CM | POA: Diagnosis present

## 2017-10-22 DIAGNOSIS — K210 Gastro-esophageal reflux disease with esophagitis: Secondary | ICD-10-CM | POA: Diagnosis present

## 2017-10-22 DIAGNOSIS — F10929 Alcohol use, unspecified with intoxication, unspecified: Secondary | ICD-10-CM | POA: Diagnosis present

## 2017-10-22 DIAGNOSIS — G43A1 Cyclical vomiting, intractable: Secondary | ICD-10-CM | POA: Diagnosis present

## 2017-10-22 DIAGNOSIS — E7800 Pure hypercholesterolemia, unspecified: Secondary | ICD-10-CM | POA: Diagnosis present

## 2017-10-22 DIAGNOSIS — K2981 Duodenitis with bleeding: Secondary | ICD-10-CM | POA: Diagnosis present

## 2017-10-22 DIAGNOSIS — R1115 Cyclical vomiting syndrome unrelated to migraine: Secondary | ICD-10-CM

## 2017-10-22 DIAGNOSIS — Z8249 Family history of ischemic heart disease and other diseases of the circulatory system: Secondary | ICD-10-CM | POA: Diagnosis not present

## 2017-10-22 DIAGNOSIS — F4310 Post-traumatic stress disorder, unspecified: Secondary | ICD-10-CM | POA: Diagnosis present

## 2017-10-22 DIAGNOSIS — K209 Esophagitis, unspecified without bleeding: Secondary | ICD-10-CM

## 2017-10-22 DIAGNOSIS — F4312 Post-traumatic stress disorder, chronic: Secondary | ICD-10-CM | POA: Diagnosis not present

## 2017-10-22 DIAGNOSIS — F1420 Cocaine dependence, uncomplicated: Secondary | ICD-10-CM | POA: Diagnosis present

## 2017-10-22 DIAGNOSIS — F1410 Cocaine abuse, uncomplicated: Secondary | ICD-10-CM | POA: Diagnosis present

## 2017-10-22 LAB — ETHANOL: ALCOHOL ETHYL (B): 107 mg/dL — AB (ref <10)

## 2017-10-22 LAB — COMPREHENSIVE METABOLIC PANEL
ALT: 16 U/L — ABNORMAL LOW (ref 17–63)
AST: 19 U/L (ref 15–41)
Albumin: 4.5 g/dL (ref 3.5–5.0)
Alkaline Phosphatase: 73 U/L (ref 38–126)
Anion gap: 13 (ref 5–15)
BILIRUBIN TOTAL: 0.7 mg/dL (ref 0.3–1.2)
BUN: 11 mg/dL (ref 6–20)
CHLORIDE: 97 mmol/L — AB (ref 101–111)
CO2: 27 mmol/L (ref 22–32)
Calcium: 9.2 mg/dL (ref 8.9–10.3)
Creatinine, Ser: 1.02 mg/dL (ref 0.61–1.24)
Glucose, Bld: 127 mg/dL — ABNORMAL HIGH (ref 65–99)
POTASSIUM: 3.7 mmol/L (ref 3.5–5.1)
Sodium: 137 mmol/L (ref 135–145)
TOTAL PROTEIN: 7.7 g/dL (ref 6.5–8.1)

## 2017-10-22 LAB — CBC
HCT: 48.6 % (ref 39.0–52.0)
HEMOGLOBIN: 16.6 g/dL (ref 13.0–17.0)
MCH: 31.1 pg (ref 26.0–34.0)
MCHC: 34.2 g/dL (ref 30.0–36.0)
MCV: 91.2 fL (ref 78.0–100.0)
Platelets: 273 10*3/uL (ref 150–400)
RBC: 5.33 MIL/uL (ref 4.22–5.81)
RDW: 13.4 % (ref 11.5–15.5)
WBC: 8.6 10*3/uL (ref 4.0–10.5)

## 2017-10-22 LAB — ACETAMINOPHEN LEVEL: Acetaminophen (Tylenol), Serum: <10 ug/mL — ABNORMAL LOW (ref 10–30)

## 2017-10-22 LAB — RAPID URINE DRUG SCREEN, HOSP PERFORMED
Amphetamines: NOT DETECTED
BARBITURATES: NOT DETECTED
Benzodiazepines: NOT DETECTED
Cocaine: POSITIVE — AB
OPIATES: NOT DETECTED
Tetrahydrocannabinol: POSITIVE — AB

## 2017-10-22 LAB — LIPASE, BLOOD: Lipase: 38 U/L (ref 11–51)

## 2017-10-22 LAB — SALICYLATE LEVEL

## 2017-10-22 MED ORDER — CHLORDIAZEPOXIDE HCL 25 MG PO CAPS
50.0000 mg | ORAL_CAPSULE | Freq: Every day | ORAL | Status: AC
  Administered 2017-10-22: 50 mg via ORAL
  Filled 2017-10-22: qty 2

## 2017-10-22 MED ORDER — ONDANSETRON HCL 4 MG/2ML IJ SOLN
4.0000 mg | Freq: Once | INTRAMUSCULAR | Status: AC | PRN
  Administered 2017-10-22: 4 mg via INTRAVENOUS
  Filled 2017-10-22: qty 2

## 2017-10-22 MED ORDER — PANTOPRAZOLE SODIUM 40 MG PO TBEC
40.0000 mg | DELAYED_RELEASE_TABLET | Freq: Two times a day (BID) | ORAL | Status: DC
  Administered 2017-10-22 – 2017-10-23 (×3): 40 mg via ORAL
  Filled 2017-10-22 (×3): qty 1

## 2017-10-22 MED ORDER — LORAZEPAM 2 MG/ML IJ SOLN: 0.0000 mg | Freq: Two times a day (BID) | INTRAMUSCULAR | Status: DC

## 2017-10-22 MED ORDER — PANTOPRAZOLE SODIUM 40 MG PO TBEC: 40.0000 mg | DELAYED_RELEASE_TABLET | Freq: Every day | ORAL | Status: DC

## 2017-10-22 MED ORDER — LORAZEPAM 1 MG PO TABS: 0.0000 mg | ORAL_TABLET | Freq: Two times a day (BID) | ORAL | Status: DC

## 2017-10-22 MED ORDER — PANTOPRAZOLE SODIUM 40 MG IV SOLR
40.0000 mg | Freq: Once | INTRAVENOUS | Status: AC
  Administered 2017-10-22: 40 mg via INTRAVENOUS
  Filled 2017-10-22: qty 40

## 2017-10-22 MED ORDER — CHLORHEXIDINE GLUCONATE 0.12 % MT SOLN
15.0000 mL | Freq: Two times a day (BID) | OROMUCOSAL | Status: DC
  Administered 2017-10-23: 15 mL via OROMUCOSAL
  Filled 2017-10-22 (×2): qty 15

## 2017-10-22 MED ORDER — SODIUM CHLORIDE 0.9 % IV SOLN
INTRAVENOUS | Status: DC
  Administered 2017-10-22: 18:00:00 via INTRAVENOUS

## 2017-10-22 MED ORDER — ORAL CARE MOUTH RINSE
15.0000 mL | Freq: Two times a day (BID) | OROMUCOSAL | Status: DC
  Administered 2017-10-22: 15 mL via OROMUCOSAL

## 2017-10-22 MED ORDER — PROMETHAZINE HCL 25 MG/ML IJ SOLN
25.0000 mg | Freq: Once | INTRAMUSCULAR | Status: AC
  Administered 2017-10-22: 25 mg via INTRAVENOUS
  Filled 2017-10-22: qty 1

## 2017-10-22 MED ORDER — IOPAMIDOL (ISOVUE-300) INJECTION 61%
INTRAVENOUS | Status: AC
  Filled 2017-10-22: qty 100

## 2017-10-22 MED ORDER — LORAZEPAM 1 MG PO TABS
0.0000 mg | ORAL_TABLET | Freq: Four times a day (QID) | ORAL | Status: DC
  Administered 2017-10-22: 1 mg via ORAL
  Administered 2017-10-23: 2 mg via ORAL
  Administered 2017-10-23: 1 mg via ORAL
  Filled 2017-10-22: qty 1
  Filled 2017-10-22: qty 2
  Filled 2017-10-22: qty 1

## 2017-10-22 MED ORDER — VITAMIN B-1 100 MG PO TABS
100.0000 mg | ORAL_TABLET | Freq: Every day | ORAL | Status: DC
  Administered 2017-10-22 – 2017-10-23 (×2): 100 mg via ORAL
  Filled 2017-10-22 (×2): qty 1

## 2017-10-22 MED ORDER — CLONIDINE HCL 0.1 MG PO TABS: 0.1000 mg | ORAL_TABLET | Freq: Four times a day (QID) | ORAL | Status: DC | PRN

## 2017-10-22 MED ORDER — LORAZEPAM 2 MG/ML IJ SOLN
0.0000 mg | Freq: Four times a day (QID) | INTRAMUSCULAR | Status: DC
  Administered 2017-10-22: 1 mg via INTRAVENOUS
  Administered 2017-10-22: 2 mg via INTRAVENOUS
  Filled 2017-10-22 (×2): qty 1

## 2017-10-22 MED ORDER — ENOXAPARIN SODIUM 40 MG/0.4ML ~~LOC~~ SOLN
40.0000 mg | SUBCUTANEOUS | Status: DC
  Administered 2017-10-22: 40 mg via SUBCUTANEOUS
  Filled 2017-10-22: qty 0.4

## 2017-10-22 MED ORDER — LORAZEPAM 2 MG/ML IJ SOLN
1.0000 mg | Freq: Once | INTRAMUSCULAR | Status: AC
  Administered 2017-10-22: 1 mg via INTRAVENOUS
  Filled 2017-10-22: qty 1

## 2017-10-22 MED ORDER — THIAMINE HCL 100 MG/ML IJ SOLN
100.0000 mg | Freq: Every day | INTRAMUSCULAR | Status: DC
  Administered 2017-10-22: 100 mg via INTRAVENOUS
  Filled 2017-10-22: qty 2

## 2017-10-22 MED ORDER — THIAMINE HCL 100 MG/ML IJ SOLN
Freq: Once | INTRAVENOUS | Status: AC
  Administered 2017-10-22: 16:00:00 via INTRAVENOUS
  Filled 2017-10-22: qty 1000

## 2017-10-22 MED ORDER — DICYCLOMINE HCL 10 MG/ML IM SOLN
20.0000 mg | Freq: Once | INTRAMUSCULAR | Status: AC
  Administered 2017-10-22: 20 mg via INTRAMUSCULAR
  Filled 2017-10-22: qty 2

## 2017-10-22 MED ORDER — IOPAMIDOL (ISOVUE-300) INJECTION 61%
100.0000 mL | Freq: Once | INTRAVENOUS | Status: AC | PRN
  Administered 2017-10-22: 100 mL via INTRAVENOUS

## 2017-10-22 MED ORDER — CHLORDIAZEPOXIDE HCL 25 MG PO CAPS
25.0000 mg | ORAL_CAPSULE | Freq: Every day | ORAL | Status: AC
  Administered 2017-10-23: 25 mg via ORAL
  Filled 2017-10-22: qty 1

## 2017-10-22 NOTE — H&P (Addendum)
TRH H&P   Patient Demographics:    Chase Moss, is a 51 y.o. male  MRN: [id number]   DOB - [birth year]  Admit Date - 10/22/2017  Outpatient Primary MD for the patient is Jackie Plum, MD  Referring MD/NP/PA:  Dahlia Client Muthersbaugh  Outpatient Specialists:   Patient coming from: home  Chief Complaint  Patient presents with  . Suicidal  . Alcohol Intoxication  . Emesis  . Detox      HPI:    Chase Moss  is a 51 y.o. male, w Bipolar do, depression, Ptsd, polysubstance abuse, hypertension apparently presented to ER stating that he tried to stop drinking and developed worsening nausea vomitting and abdominal pain .  Pt therefore decided to drink some alcohol and then present to the ED.  Pt states that he is suicidal with a plan to overdose on his sleeping medication.    In ED,   CT scan abd/ pelvis IMPRESSION: 1. Mild gastritis/ duodenitis with small suspected non perforated ulcer. 2. Mildly thickened esophagus without inflammation, possible esophagitis. 3. No cirrhosis or ascites.   ETOH 107 UDS + coccaine, + Thc  Bun 11, Creatinine 1.02 Ast 19, Alt 16  Lipase 38.   Pt will not talk with me. This history is from prior ED physician.  Pt will be admitted for etoh intoxication and suicidal ideation.           Review of systems:    In addition to the HPI above, No Fever-chills, No Headache, No changes with Vision or hearing, No problems swallowing food or Liquids, No Chest pain, Cough or Shortness of Breath, No Blood in stool or Urine, No dysuria, No new skin rashes or bruises, No new joints pains-aches,  No new weakness, tingling, numbness in any extremity, No recent weight gain or loss, No polyuria, polydypsia or polyphagia, No significant Mental Stressors.  A full 10 point Review of Systems was done, except as stated above, all other  Review of Systems were negative.   With Past History of the following :    Past Medical History:  Diagnosis Date  . Bipolar 1 disorder (HCC)   . Depression   . Hypertension   . PTSD (post-traumatic stress disorder)       History reviewed. No pertinent surgical history.    Social History:     Social History  Substance Use Topics  . Smoking status: Current Every Day Smoker    Packs/day: 1.00    Years: 30.00    Types: Cigarettes  . Smokeless tobacco: Never Used  . Alcohol use 1.2 oz/week    2 Cans of beer per week     Comment: daily alcohol     Lives - at home  Mobility - walks by self   Family History :     Family History  Problem Relation Age of Onset  . Heart attack Other  Home Medications:   Prior to Admission medications   Medication Sig Start Date End Date Taking? Authorizing Provider  ARIPiprazole (ABILIFY) 20 MG tablet Take 1 tablet (20 mg total) by mouth daily. For mood control Patient not taking: Reported on 10/22/2017 05/23/17   Armandina Stammer I, NP  atenolol (TENORMIN) 50 MG tablet Take 1 tablet (50 mg total) by mouth daily. For high blood pressure Patient not taking: Reported on 10/22/2017 05/23/17   Armandina Stammer I, NP  buPROPion (WELLBUTRIN XL) 300 MG 24 hr tablet Take 1 tablet (300 mg total) by mouth daily. For depression Patient not taking: Reported on 10/22/2017 05/23/17   Armandina Stammer I, NP  hydrOXYzine (ATARAX/VISTARIL) 25 MG tablet Take 1 tablet (25 mg total) by mouth every 4 (four) hours as needed for anxiety (Sleep). Patient not taking: Reported on 10/22/2017 05/22/17   Armandina Stammer I, NP  lisinopril (PRINIVIL,ZESTRIL) 5 MG tablet Take 1 tablet (5 mg total) by mouth daily. For high blood pressure Patient not taking: Reported on 10/22/2017 05/23/17   Armandina Stammer I, NP  mirtazapine (REMERON) 7.5 MG tablet Take 1 tablet (7.5 mg total) by mouth at bedtime. For sleep Patient not taking: Reported on 10/22/2017 05/22/17   Armandina Stammer I, NP    pantoprazole (PROTONIX) 40 MG tablet Take 1 tablet (40 mg total) by mouth daily. For acid reflux Patient not taking: Reported on 10/22/2017 05/23/17   Armandina Stammer I, NP  pravastatin (PRAVACHOL) 40 MG tablet Take 1 tablet (40 mg total) by mouth every evening. For high cholesterol Patient not taking: Reported on 10/22/2017 05/22/17   Armandina Stammer I, NP  prazosin (MINIPRESS) 5 MG capsule Take 1 capsule (5 mg total) by mouth at bedtime. For nightmares Patient not taking: Reported on 10/22/2017 05/22/17   Armandina Stammer I, NP     Allergies:    No Known Allergies   Physical Exam:   Vitals  Blood pressure (!) 156/106, pulse 76, temperature 97.9 F (36.6 C), temperature source Oral, resp. rate 18, height 6' (1.829 m), weight 90.7 kg (200 lb), SpO2 96 %.   1. General  lying in bed in NAD,   2. Normal affect and insight, Not Suicidal or Homicidal, Awake Alert, Oriented X 3.  3. No F.N deficits, ALL C.Nerves Intact, Strength 5/5 all 4 extremities, Sensation intact all 4 extremities, Plantars down going.  4. Ears and Eyes appear Normal, Conjunctivae clear, PERRLA. Moist Oral Mucosa.  5. Supple Neck, No JVD, No cervical lymphadenopathy appriciated, No Carotid Bruits.  6. Symmetrical Chest wall movement, Good air movement bilaterally, CTAB.  7. RRR, No Gallops, Rubs or Murmurs, No Parasternal Heave.  8. Positive Bowel Sounds, Abdomen Soft, No tenderness, No organomegaly appriciated,No rebound -guarding or rigidity.  9.  No Cyanosis, Normal Skin Turgor, Slight palmar erythema,   10. Good muscle tone,  joints appear normal , no effusions, Normal ROM.  11. No Palpable Lymph Nodes in Neck or Axillae    Data Review:    CBC  Recent Labs Lab 10/22/17 0051  WBC 8.6  HGB 16.6  HCT 48.6  PLT 273  MCV 91.2  MCH 31.1  MCHC 34.2  RDW 13.4   ------------------------------------------------------------------------------------------------------------------  Chemistries   Recent  Labs Lab 10/22/17 0051  NA 137  K 3.7  CL 97*  CO2 27  GLUCOSE 127*  BUN 11  CREATININE 1.02  CALCIUM 9.2  AST 19  ALT 16*  ALKPHOS 73  BILITOT 0.7   ------------------------------------------------------------------------------------------------------------------ estimated creatinine clearance is  91.9 mL/min (by C-G formula based on SCr of 1.02 mg/dL). ------------------------------------------------------------------------------------------------------------------ No results for input(s): TSH, T4TOTAL, T3FREE, THYROIDAB in the last 72 hours.  Invalid input(s): FREET3  Coagulation profile No results for input(s): INR, PROTIME in the last 168 hours. ------------------------------------------------------------------------------------------------------------------- No results for input(s): DDIMER in the last 72 hours. -------------------------------------------------------------------------------------------------------------------  Cardiac Enzymes No results for input(s): CKMB, TROPONINI, MYOGLOBIN in the last 168 hours.  Invalid input(s): CK ------------------------------------------------------------------------------------------------------------------ No results found for: BNP   ---------------------------------------------------------------------------------------------------------------  Urinalysis    Component Value Date/Time   COLORURINE YELLOW 05/14/2012 0139   APPEARANCEUR CLEAR 05/14/2012 0139   LABSPEC 1.014 05/14/2012 0139   PHURINE 5.0 05/14/2012 0139   GLUCOSEU NEGATIVE 05/14/2012 0139   HGBUR NEGATIVE 05/14/2012 0139   BILIRUBINUR NEGATIVE 05/14/2012 0139   KETONESUR NEGATIVE 05/14/2012 0139   PROTEINUR NEGATIVE 05/14/2012 0139   UROBILINOGEN 0.2 05/14/2012 0139   NITRITE NEGATIVE 05/14/2012 0139   LEUKOCYTESUR NEGATIVE 05/14/2012 0139     ----------------------------------------------------------------------------------------------------------------   Imaging Results:    Ct Abdomen Pelvis W Contrast  Result Date: 10/22/2017 CLINICAL DATA:  Abdominal distension, nausea and vomiting. History of alcohol and substance abuse, hypertension. Off medication for 2 months. EXAM: CT ABDOMEN AND PELVIS WITH CONTRAST TECHNIQUE: Multidetector CT imaging of the abdomen and pelvis was performed using the standard protocol following bolus administration of intravenous contrast. CONTRAST:  ISOVUE-300 IOPAMIDOL (ISOVUE-300) INJECTION 61% COMPARISON:  None. FINDINGS: LOWER CHEST: Dependent atelectasis. Heart size is upper limits of normal. No pericardial effusion. Mild coronary artery calcifications. Mildly thickened esophagus. HEPATOBILIARY: Liver and gallbladder are normal. PANCREAS: Normal. SPLEEN: Normal. ADRENALS/URINARY TRACT: Kidneys are orthotopic, demonstrating symmetric enhancement. No nephrolithiasis, hydronephrosis or solid renal masses. The unopacified ureters are normal in course and caliber. Delayed imaging through the kidneys demonstrates symmetric prompt contrast excretion within the proximal urinary collecting system. Urinary bladder is well distended and unremarkable. Normal adrenal glands. STOMACH/BOWEL: Mild gastric antral wall thickening first portion of the duodenum subcentimeter probable ulcer with probable gas (coronal 52/127) and inflammation. The small and large bowel are normal in course and caliber without inflammatory changes, sensitivity decreased without oral contrast. Normal appendix. VASCULAR/LYMPHATIC: Aortoiliac vessels are normal in course and caliber. Mild calcific atherosclerosis. No lymphadenopathy by CT size criteria. REPRODUCTIVE: Normal. OTHER: No intraperitoneal free fluid or free air. MUSCULOSKELETAL: Nonacute. Small fat containing umbilical hernia. 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 IMPRESSION: 1. Mild gastritis/ duodenitis with small suspected non perforated ulcer. 2. Mildly thickened esophagus without inflammation, possible esophagitis. 3. No cirrhosis or ascites. Electronically Signed   By: Awilda Metro M.D.   On: 10/22/2017 04:48    Assessment & Plan:    Principal Problem:   Alcohol intoxication (HCC) Active Problems:   Cannabis use disorder, moderate, dependence (HCC)   Cocaine abuse (HCC)    Alcohol intoxication libruim 50mg  po qday x 1 day then 25mg  po qday x 1 day.  Cont CIWA  Suicidal ideation 1:1 sitter  Please consult psychiatry this am.   Marijuana use Tried to counsel on cessation  Coccaine abuse Tried to counsel on cessation  Depression, Bipolar Pt is not currently taking his medication  Hypertension Clonidine 0.1mg  po qid prn sbp >160   Esophagitis/ Gastritis protonix 40mg  po bid  DVT Prophylaxis Lovenox - SCDs   AM Labs Ordered, also please review Full Orders  Family Communication: Admission, patients condition and plan of care including tests being ordered have been discussed with the patient  who indicate understanding  and agree with the plan and Code Status.  Code Status FULL CODE  Likely DC to  home  Condition GUARDED    Consults called: none  Admission status: inpatient   Time spent in minutes : 45    Pearson GrippeJames Suzetta Timko M.D on 10/22/2017 at 6:27 AM  Between 7am to 7pm - Pager - [phone]  After 7pm go to [email or web address] - password [email or web address]  [facility] - Office  [phone]

## 2017-10-22 NOTE — Progress Notes (Addendum)
Reviewed H&P by Dr. Selena BattenKim.  We will continue original plan of care except we will downgrade patient to telemetry unit.  Patient is sleepy but arousable, oriented.  RRR no MRG.  Normal work of breathing.  Per nursing CIWA score is 1 right now.  Haydee SalterPhillip M Hobbs

## 2017-10-22 NOTE — Progress Notes (Signed)
Discussed plan of care with patient. Patient states he was drinking at least two-three 40oz Natural Light beers per day. He states he started drinking more when his sister died in January 2018. His mother also passed fairly recently. He states he has no family nearby anymore, but originally moved here to be near his mother and sister, who have both died. [place]. He and his wife are no longer together. He lives in a boarding house with 5 other people. He states "life is boring" and "Life sucks", when asking about his past history of depression he stated that medication did help. He states he stopped taking all medication because "I didn't care anymore". Pt does admit he has had thoughts of harming himself. He is alert and oriented to situation.

## 2017-10-22 NOTE — ED Notes (Signed)
Bed: [id number] Expected date:  Expected time:  Means of arrival:  Comments: EMS 51 yo male vomiting for "several hours"-intoxicated-requesting detox-SI

## 2017-10-22 NOTE — ED Notes (Signed)
No further vomiting noted after receiving medications.

## 2017-10-22 NOTE — ED Triage Notes (Signed)
Patient arrives by EMS with complaints of alcohol withdrawal-drank 2 40's today-last drink 2 hours ago. Patient not taking his medications for the past 2 months-also states suicidal thoughts. Patient actively vomiting.

## 2017-10-22 NOTE — ED Notes (Signed)
Patient up to bathroom-every time he gets up he starts dry heaving-assisted back to bed

## 2017-10-22 NOTE — ED Notes (Signed)
Intermittent nausea and dry heaving-decreased after IV Ativan

## 2017-10-22 NOTE — ED Provider Notes (Signed)
[facility]-EMERGENCY DEPT Provider Note   CSN: [id number] Arrival date & time: 10/22/17  0045     History   Chief Complaint Chief Complaint  Patient presents with  . Suicidal  . Alcohol Intoxication  . Emesis  . Detox    HPI Fredna DowGregory Frankland is a 51 y.o. male with a hx of Bipolar disorder, depression, HTN, PTSD, HTN, polysubstance abuse presents to the Emergency Department complaining of gradual, persistent, progressively worsening nausea, vomiting and generalized abd pain onset 3 days ago when he attempted to stop drinking.  Pt reports over the last few months, he has attempted this several times each time developing this abdominal pain.  Patient reports his last drink was on Saturday and he has been vomiting since that time.  He reports emesis is intermittently streaked with blood but no large clots or strict hematemesis.  Patient reports in an effort to make the symptoms abate, he did have two 40 ounce beers about 3 hours ago.  Patient denies history of alcohol withdrawal seizures.  He reports that he has had one previous suicide attempt where he slit his left wrist.  Patient reports several previous stays in rehab.  He states that time he is currently suicidal with a plan to overdose on his sleeping medication.  He is requesting help for his psychiatric problems and his drinking problem.  He states he has not been taking his psychiatric medicines for the last several months.  Patient denies headache, neck pain, vision changes, chest pain, shortness of breath, weakness, dizziness, syncope, dysuria.    The history is provided by the patient and medical records. No language interpreter was used.    Past Medical History:  Diagnosis Date  . Bipolar 1 disorder (HCC)   . Depression   . Hypertension   . PTSD (post-traumatic stress disorder)     Patient Active Problem List   Diagnosis Date Noted  . Major depressive disorder, recurrent, severe with psychotic features  (HCC) 05/15/2017  . Cocaine abuse (HCC) 03/31/2017  . HTN (hypertension) 06/20/2016  . Major depressive disorder, recurrent severe without psychotic features (HCC) 06/20/2016  . Tobacco use disorder 06/20/2016  . Trauma 07/27/2015  . Cannabis use disorder, moderate, dependence (HCC)   . PTSD (post-traumatic stress disorder)   . Substance induced mood disorder (HCC) 07/07/2014  . Alcohol use disorder, moderate, dependence (HCC) 04/10/2012  . Suicidal thoughts 04/04/2012    History reviewed. No pertinent surgical history.     Home Medications    Prior to Admission medications   Medication Sig Start Date End Date Taking? Authorizing Provider  ARIPiprazole (ABILIFY) 20 MG tablet Take 1 tablet (20 mg total) by mouth daily. For mood control Patient not taking: Reported on 10/22/2017 05/23/17   Armandina StammerNwoko, Agnes I, NP  atenolol (TENORMIN) 50 MG tablet Take 1 tablet (50 mg total) by mouth daily. For high blood pressure Patient not taking: Reported on 10/22/2017 05/23/17   Armandina StammerNwoko, Agnes I, NP  buPROPion (WELLBUTRIN XL) 300 MG 24 hr tablet Take 1 tablet (300 mg total) by mouth daily. For depression Patient not taking: Reported on 10/22/2017 05/23/17   Armandina StammerNwoko, Agnes I, NP  hydrOXYzine (ATARAX/VISTARIL) 25 MG tablet Take 1 tablet (25 mg total) by mouth every 4 (four) hours as needed for anxiety (Sleep). Patient not taking: Reported on 10/22/2017 05/22/17   Armandina StammerNwoko, Agnes I, NP  lisinopril (PRINIVIL,ZESTRIL) 5 MG tablet Take 1 tablet (5 mg total) by mouth daily. For high blood pressure Patient not taking:  Reported on 10/22/2017 05/23/17   Armandina Stammer I, NP  mirtazapine (REMERON) 7.5 MG tablet Take 1 tablet (7.5 mg total) by mouth at bedtime. For sleep Patient not taking: Reported on 10/22/2017 05/22/17   Armandina Stammer I, NP  pantoprazole (PROTONIX) 40 MG tablet Take 1 tablet (40 mg total) by mouth daily. For acid reflux Patient not taking: Reported on 10/22/2017 05/23/17   Armandina Stammer I, NP  pravastatin  (PRAVACHOL) 40 MG tablet Take 1 tablet (40 mg total) by mouth every evening. For high cholesterol Patient not taking: Reported on 10/22/2017 05/22/17   Armandina Stammer I, NP  prazosin (MINIPRESS) 5 MG capsule Take 1 capsule (5 mg total) by mouth at bedtime. For nightmares Patient not taking: Reported on 10/22/2017 05/22/17   Sanjuana Kava, NP    Family History Family History  Problem Relation Age of Onset  . Heart attack Other     Social History Social History  Substance Use Topics  . Smoking status: Current Every Day Smoker    Packs/day: 1.00    Years: 30.00    Types: Cigarettes  . Smokeless tobacco: Never Used  . Alcohol use 1.2 oz/week    2 Cans of beer per week     Comment: daily alcohol     Allergies   Patient has no known allergies.   Review of Systems Review of Systems  Constitutional: Negative for appetite change, diaphoresis, fatigue, fever and unexpected weight change.  HENT: Negative for mouth sores.   Eyes: Negative for visual disturbance.  Respiratory: Negative for cough, chest tightness, shortness of breath and wheezing.   Cardiovascular: Negative for chest pain.  Gastrointestinal: Positive for abdominal pain, nausea and vomiting. Negative for constipation and diarrhea.  Endocrine: Negative for polydipsia, polyphagia and polyuria.  Genitourinary: Negative for dysuria, frequency, hematuria and urgency.  Musculoskeletal: Negative for back pain and neck stiffness.  Skin: Negative for rash.  Allergic/Immunologic: Negative for immunocompromised state.  Neurological: Negative for syncope, light-headedness and headaches.  Hematological: Does not bruise/bleed easily.  Psychiatric/Behavioral: Positive for suicidal ideas. Negative for sleep disturbance. The patient is not nervous/anxious.      Physical Exam Updated Vital Signs BP (!) 156/106 (BP Location: Left Arm)   Pulse 76   Temp 97.9 F (36.6 C) (Oral)   Resp 18   Ht 6' (1.829 m)   Wt 90.7 kg (200 lb)    SpO2 96%   BMI 27.12 kg/m   Physical Exam  Constitutional: He appears well-developed and well-nourished. No distress.  Awake, alert, nontoxic appearance  HENT:  Head: Normocephalic and atraumatic.  Mouth/Throat: Oropharynx is clear and moist. No oropharyngeal exudate.  Eyes: Conjunctivae are normal. No scleral icterus.  Neck: Normal range of motion. Neck supple.  Cardiovascular: Normal rate, regular rhythm and intact distal pulses.   Pulmonary/Chest: Effort normal and breath sounds normal. No respiratory distress. He has no wheezes.  Equal chest expansion  Abdominal: Soft. Bowel sounds are normal. He exhibits no mass. There is generalized tenderness. There is guarding (voluntary). There is no rigidity, no rebound and no CVA tenderness.  Pt actively vomiting  Musculoskeletal: Normal range of motion. He exhibits no edema.  Neurological: He is alert.  Speech is clear and goal oriented Moves extremities without ataxia  Skin: Skin is warm and dry. He is not diaphoretic.  Psychiatric: His mood appears anxious. He is not actively hallucinating. He exhibits a depressed mood. He expresses suicidal ideation. He expresses no homicidal ideation. He expresses suicidal plans ( OD  on sleeping medication). He expresses no homicidal plans.  Nursing note and vitals reviewed.    ED Treatments / Results  Labs (all labs ordered are listed, but only abnormal results are displayed) Labs Reviewed  COMPREHENSIVE METABOLIC PANEL - Abnormal; Notable for the following:       Result Value   Chloride 97 (*)    Glucose, Bld 127 (*)    ALT 16 (*)    All other components within normal limits  ETHANOL - Abnormal; Notable for the following:    Alcohol, Ethyl (B) 107 (*)    All other components within normal limits  ACETAMINOPHEN LEVEL - Abnormal; Notable for the following:    Acetaminophen (Tylenol), Serum <10 (*)    All other components within normal limits  RAPID URINE DRUG SCREEN, HOSP PERFORMED -  Abnormal; Notable for the following:    Cocaine POSITIVE (*)    Tetrahydrocannabinol POSITIVE (*)    All other components within normal limits  SALICYLATE LEVEL  CBC  LIPASE, BLOOD     Radiology Ct Abdomen Pelvis W Contrast  Result Date: 10/22/2017 CLINICAL DATA:  Abdominal distension, nausea and vomiting. History of alcohol and substance abuse, hypertension. Off medication for 2 months. EXAM: CT ABDOMEN AND PELVIS WITH CONTRAST TECHNIQUE: Multidetector CT imaging of the abdomen and pelvis was performed using the standard protocol following bolus administration of intravenous contrast. CONTRAST:  ISOVUE-300 IOPAMIDOL (ISOVUE-300) INJECTION 61% COMPARISON:  None. FINDINGS: LOWER CHEST: Dependent atelectasis. Heart size is upper limits of normal. No pericardial effusion. Mild coronary artery calcifications. Mildly thickened esophagus. HEPATOBILIARY: Liver and gallbladder are normal. PANCREAS: Normal. SPLEEN: Normal. ADRENALS/URINARY TRACT: Kidneys are orthotopic, demonstrating symmetric enhancement. No nephrolithiasis, hydronephrosis or solid renal masses. The unopacified ureters are normal in course and caliber. Delayed imaging through the kidneys demonstrates symmetric prompt contrast excretion within the proximal urinary collecting system. Urinary bladder is well distended and unremarkable. Normal adrenal glands. STOMACH/BOWEL: Mild gastric antral wall thickening first portion of the duodenum subcentimeter probable ulcer with probable gas (coronal 52/127) and inflammation. The small and large bowel are normal in course and caliber without inflammatory changes, sensitivity decreased without oral contrast. Normal appendix. VASCULAR/LYMPHATIC: Aortoiliac vessels are normal in course and caliber. Mild calcific atherosclerosis. No lymphadenopathy by CT size criteria. REPRODUCTIVE: Normal. OTHER: No intraperitoneal free fluid or free air. MUSCULOSKELETAL: Nonacute. Small fat containing umbilical  hernia. 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 IMPRESSION: 1. Mild gastritis/ duodenitis with small suspected non perforated ulcer. 2. Mildly thickened esophagus without inflammation, possible esophagitis. 3. No cirrhosis or ascites. Electronically Signed   By: Awilda Metro M.D.   On: 10/22/2017 04:48    Procedures Procedures (including critical care time)  Medications Ordered in ED Medications  LORazepam (ATIVAN) injection 0-4 mg (2 mg Intravenous Given 10/22/17 0343)    Or  LORazepam (ATIVAN) tablet 0-4 mg ( Oral See Alternative 10/22/17 0343)  LORazepam (ATIVAN) injection 0-4 mg (not administered)    Or  LORazepam (ATIVAN) tablet 0-4 mg (not administered)  thiamine (VITAMIN B-1) tablet 100 mg ( Oral See Alternative 10/22/17 0342)    Or  thiamine (B-1) injection 100 mg (100 mg Intravenous Given 10/22/17 0342)  iopamidol (ISOVUE-300) 61 % injection (not administered)  ondansetron (ZOFRAN) injection 4 mg (4 mg Intravenous Given 10/22/17 0105)  LORazepam (ATIVAN) injection 1 mg (1 mg Intravenous Given 10/22/17 0203)  dicyclomine (BENTYL) injection 20 mg (20 mg Intramuscular Given 10/22/17 0203)  promethazine (PHENERGAN) injection 25 mg (25  mg Intravenous Given 10/22/17 0345)  iopamidol (ISOVUE-300) 61 % injection 100 mL (100 mLs Intravenous Contrast Given 10/22/17 0417)  pantoprazole (PROTONIX) injection 40 mg (40 mg Intravenous Given 10/22/17 0604)     Initial Impression / Assessment and Plan / ED Course  I have reviewed the triage vital signs and the nursing notes.  Pertinent labs & imaging results that were available during my care of the patient were reviewed by me and considered in my medical decision making (see chart for details).  Clinical Course as of Oct 23 623  Tue Oct 22, 2017  1610 Discussed with Dr. Selena Batten who will admit  [HM]    Clinical Course User Index [HM] Ziva Nunziata, Dahlia Client, [place]    Patient presents to the  emergency department with persistent vomiting.  He reports that he drinks regularly and stopped cold [place] 3 days ago.  Patient reports that since that time he has had diaphoresis, vomiting and abdominal pain.  He reports occasional blood-streaked episodes of emesis without large volume hematemesis.  Patient reports that this evening he drank some alcohol in an attempt to make the symptoms stop.  On arrival patient CIWA score 9.  After 1mg  ativan it increased to 13.  Patient with multiple rounds of antiemetics to stop his vomiting.  CT scan shows gastritis and esophagitis.  This is likely the source of patient's hematemesis.  Drug screen is positive for cocaine and THC.  Labs otherwise reassuring.  Patient will be admitted for further monitoring.   Final Clinical Impressions(s) / ED Diagnoses   Final diagnoses:  Alcohol abuse  Intractable cyclical vomiting with nausea  Polysubstance abuse (HCC)  Esophagitis  Gastritis, presence of bleeding unspecified, unspecified chronicity, unspecified gastritis type    New Prescriptions New Prescriptions   No medications on file     Milta Deiters 10/22/17 9604    Ward, Layla Maw, DO 10/22/17 [phone]

## 2017-10-22 NOTE — Progress Notes (Signed)
Pt seen and examined at bedside, please note that pt was admitted after midnight, please see earlier admission note by Dr. Pearson GrippeJames Kim. Pt admitted, intoxicated, alcohol level > 100, UDS + for cocaine and THC. Place on CIWA protocol and admitted for alcohol detox. Due to SI, sitter at bedside. CBC and BMP reviewed, stable.   Debbora PrestoMAGICK-Eamon Tantillo, MD  [facility] Pager [phone]  If 7PM-7AM, please contact night-coverage [email or web address] Password [email or web address]

## 2017-10-22 NOTE — ED Notes (Signed)
Dr.Kim at bedside to evaluate pt for admission 

## 2017-10-22 NOTE — Progress Notes (Signed)
Two bags of patient belongings placed in plastic bins in locked equipment room.

## 2017-10-22 NOTE — ED Notes (Signed)
Pt having active vomiting at this time. Provider to be notified.

## 2017-10-23 DIAGNOSIS — F17210 Nicotine dependence, cigarettes, uncomplicated: Secondary | ICD-10-CM

## 2017-10-23 DIAGNOSIS — R531 Weakness: Secondary | ICD-10-CM

## 2017-10-23 DIAGNOSIS — F419 Anxiety disorder, unspecified: Secondary | ICD-10-CM

## 2017-10-23 DIAGNOSIS — F314 Bipolar disorder, current episode depressed, severe, without psychotic features: Secondary | ICD-10-CM

## 2017-10-23 DIAGNOSIS — G4700 Insomnia, unspecified: Secondary | ICD-10-CM

## 2017-10-23 DIAGNOSIS — F1410 Cocaine abuse, uncomplicated: Secondary | ICD-10-CM

## 2017-10-23 DIAGNOSIS — F10929 Alcohol use, unspecified with intoxication, unspecified: Secondary | ICD-10-CM

## 2017-10-23 DIAGNOSIS — R109 Unspecified abdominal pain: Secondary | ICD-10-CM

## 2017-10-23 DIAGNOSIS — R61 Generalized hyperhidrosis: Secondary | ICD-10-CM

## 2017-10-23 DIAGNOSIS — R450 Nervousness: Secondary | ICD-10-CM

## 2017-10-23 DIAGNOSIS — F1910 Other psychoactive substance abuse, uncomplicated: Secondary | ICD-10-CM

## 2017-10-23 DIAGNOSIS — F1220 Cannabis dependence, uncomplicated: Secondary | ICD-10-CM

## 2017-10-23 DIAGNOSIS — I10 Essential (primary) hypertension: Secondary | ICD-10-CM

## 2017-10-23 DIAGNOSIS — R45851 Suicidal ideations: Secondary | ICD-10-CM

## 2017-10-23 DIAGNOSIS — R112 Nausea with vomiting, unspecified: Secondary | ICD-10-CM

## 2017-10-23 DIAGNOSIS — F10239 Alcohol dependence with withdrawal, unspecified: Secondary | ICD-10-CM

## 2017-10-23 DIAGNOSIS — F4312 Post-traumatic stress disorder, chronic: Secondary | ICD-10-CM

## 2017-10-23 LAB — COMPREHENSIVE METABOLIC PANEL
ALK PHOS: 58 U/L (ref 38–126)
ALT: 12 U/L — AB (ref 17–63)
AST: 23 U/L (ref 15–41)
Albumin: 3.2 g/dL — ABNORMAL LOW (ref 3.5–5.0)
Anion gap: 9 (ref 5–15)
BILIRUBIN TOTAL: 0.5 mg/dL (ref 0.3–1.2)
BUN: 13 mg/dL (ref 6–20)
CO2: 24 mmol/L (ref 22–32)
CREATININE: 1.19 mg/dL (ref 0.61–1.24)
Calcium: 8.4 mg/dL — ABNORMAL LOW (ref 8.9–10.3)
Chloride: 106 mmol/L (ref 101–111)
Glucose, Bld: 134 mg/dL — ABNORMAL HIGH (ref 65–99)
Potassium: 3.4 mmol/L — ABNORMAL LOW (ref 3.5–5.1)
Sodium: 139 mmol/L (ref 135–145)
Total Protein: 5.8 g/dL — ABNORMAL LOW (ref 6.5–8.1)

## 2017-10-23 LAB — MAGNESIUM: MAGNESIUM: 1.8 mg/dL (ref 1.7–2.4)

## 2017-10-23 LAB — CBC
HCT: 45.1 % (ref 39.0–52.0)
Hemoglobin: 14.7 g/dL (ref 13.0–17.0)
MCH: 30.3 pg (ref 26.0–34.0)
MCHC: 32.6 g/dL (ref 30.0–36.0)
MCV: 93 fL (ref 78.0–100.0)
PLATELETS: 185 10*3/uL (ref 150–400)
RBC: 4.85 MIL/uL (ref 4.22–5.81)
RDW: 13.5 % (ref 11.5–15.5)
WBC: 6.3 10*3/uL (ref 4.0–10.5)

## 2017-10-23 LAB — PHOSPHORUS: Phosphorus: 3.2 mg/dL (ref 2.5–4.6)

## 2017-10-23 LAB — ETHANOL

## 2017-10-23 MED ORDER — POTASSIUM CHLORIDE CRYS ER 20 MEQ PO TBCR
40.0000 meq | EXTENDED_RELEASE_TABLET | Freq: Once | ORAL | Status: AC
  Administered 2017-10-23: 40 meq via ORAL
  Filled 2017-10-23: qty 2

## 2017-10-23 MED ORDER — ARIPIPRAZOLE 5 MG PO TABS: 5.0000 mg | ORAL_TABLET | Freq: Two times a day (BID) | ORAL | Status: DC

## 2017-10-23 MED ORDER — PANTOPRAZOLE SODIUM 40 MG IV SOLR: 40.0000 mg | Freq: Two times a day (BID) | INTRAVENOUS | Status: DC

## 2017-10-23 MED ORDER — PRAZOSIN HCL 1 MG PO CAPS: 2.0000 mg | ORAL_CAPSULE | Freq: Every day | ORAL | Status: DC

## 2017-10-23 MED ORDER — MIRTAZAPINE 15 MG PO TABS: 7.5000 mg | ORAL_TABLET | Freq: Every day | ORAL | Status: DC

## 2017-10-23 NOTE — Progress Notes (Signed)
Patient is leaving the hospital against the advice of the attending Physician and nursing staff. He has been informed of the risk involved in leaving the hospital without receiving further care.

## 2017-10-23 NOTE — Progress Notes (Signed)
Patient ID: Chase Moss, male   DOB: 26-Mar-1966, 51 y.o.   MRN: [id number]    PROGRESS NOTE    Jacobey Gura  WUJ:[id number] DOB: [birth year] DOA: 10/22/2017  PCP: Jackie Plum, MD   Brief Narrative:  Pt is 51 yo male with known polysubstance abuse, alcohol, cocaine, THC, presented intoxicated, AMS and unable to provide history. Per report pt was suicidal and was planning to take sleeping pills.  Assessment & Plan:   Principal Problem:   Alcohol intoxication (HCC) - mental status more clear but still rather confused and sluggish - wants to try eating - continue to provide IVF, monitor for signs of withdrawal  - keep on CIWA protocol   Active Problems:   Cannabis use disorder, moderate, dependence (HCC)   Cocaine abuse (HCC)   Alcohol withdrawal (HCC) - PSA, will need to discuss cessation once pt more medically stable - psych consulted for assistance     Suicidal ideations  - currently denies but will ask for psych input     Hypokalemia  - supplement and repeat BMP in AM    CT findings of Mild gastritis/ duodenitis  - with small suspected non perforated ulcer - also mildly thickened esophagus without inflammation, possible esophagitis - pt with nausea or vomiting this AM - will place on PPI - if drop in Hg noted, may need GI input   DVT prophylaxis: Lovenox SQ Code Status: Full Family Communication: Patient at bedside  Disposition Plan: to be determined   Consultants:   Psych   Procedures:   None  Antimicrobials:   None  Subjective: Pt reports feeling tired.   Objective: Vitals:   10/22/17 1400 10/22/17 1436 10/22/17 2145 10/23/17 1049  BP: (!) 141/87 (!) 151/101 (!) 157/98 (!) 148/88  Pulse: (!) 58 66 68 69  Resp: 17 18 18    Temp:   97.8 F (36.6 C)   TempSrc:   Oral   SpO2: 98% 98% 99%   Weight:      Height:        Intake/Output Summary (Last 24 hours) at 10/23/17 1057 Last data filed at 10/23/17 0844  Gross per 24 hour  Intake              1200 ml  Output                0 ml  Net             1200 ml   Filed Weights   10/22/17 0444  Weight: 90.7 kg (200 lb)    Examination:  General exam: Appears sluggish but easy to awake, NAD Respiratory system: Clear to auscultation. Respiratory effort normal. Cardiovascular system: S1 & S2 heard, RRR. No JVD, murmurs, rubs, gallops or clicks. No pedal edema. Gastrointestinal system: Abdomen is nondistended, soft and nontender. No organomegaly or masses felt. Normal bowel sounds heard. Central nervous system: No focal neurological deficits.  Data Reviewed: I have personally reviewed following labs and imaging studies  CBC:  Recent Labs Lab 10/22/17 0051 10/23/17 0611  WBC 8.6 6.3  HGB 16.6 14.7  HCT 48.6 45.1  MCV 91.2 93.0  PLT 273 185   Basic Metabolic Panel:  Recent Labs Lab 10/22/17 0051 10/23/17 0611  NA 137 139  K 3.7 3.4*  CL 97* 106  CO2 27 24  GLUCOSE 127* 134*  BUN 11 13  CREATININE 1.02 1.19  CALCIUM 9.2 8.4*  MG  --  1.8  PHOS  --  3.2  Liver Function Tests:  Recent Labs Lab 10/22/17 0051 10/23/17 0611  AST 19 23  ALT 16* 12*  ALKPHOS 73 58  BILITOT 0.7 0.5  PROT 7.7 5.8*  ALBUMIN 4.5 3.2*    Recent Labs Lab 10/22/17 0051  LIPASE 38   Urine analysis:    Component Value Date/Time   COLORURINE YELLOW 05/14/2012 0139   APPEARANCEUR CLEAR 05/14/2012 0139   LABSPEC 1.014 05/14/2012 0139   PHURINE 5.0 05/14/2012 0139   GLUCOSEU NEGATIVE 05/14/2012 0139   HGBUR NEGATIVE 05/14/2012 0139   BILIRUBINUR NEGATIVE 05/14/2012 0139   KETONESUR NEGATIVE 05/14/2012 0139   PROTEINUR NEGATIVE 05/14/2012 0139   UROBILINOGEN 0.2 05/14/2012 0139   NITRITE NEGATIVE 05/14/2012 0139   LEUKOCYTESUR NEGATIVE 05/14/2012 0139   Radiology Studies: Ct Abdomen Pelvis W Contrast Result Date: 10/22/2017 1. Mild gastritis/ duodenitis with small suspected non perforated ulcer. 2. Mildly thickened esophagus without inflammation, possible esophagitis.  3. No cirrhosis or ascites.   Scheduled Meds: . chlorhexidine  15 mL Mouth Rinse BID  . enoxaparin (LOVENOX) injection  40 mg Subcutaneous Q24H  . LORazepam  0-4 mg Intravenous Q6H   Or  . LORazepam  0-4 mg Oral Q6H  . [START ON 10/24/2017] LORazepam  0-4 mg Intravenous Q12H   Or  . [START ON 10/24/2017] LORazepam  0-4 mg Oral Q12H  . mouth rinse  15 mL Mouth Rinse q12n4p  . pantoprazole  40 mg Oral BID  . thiamine  100 mg Oral Daily   Continuous Infusions: . sodium chloride 75 mL/hr at 10/22/17 1759     LOS: 1 day   Time spent: 25 minutes   Debbora PrestoIskra Magick-Brayn Eckstein, MD [facility] Pager [phone]  If 7PM-7AM, please contact night-coverage [email or web address] Password [email or web address] 10/23/2017, 10:57 AM

## 2017-10-23 NOTE — Care Management Note (Signed)
Case Management Note  Patient Details  Name: Chase Moss MRN: [id number] Date of Birth: 01/18/1966  Subjective/Objective:                  etoh  Action/Plan: Date:  October 23 2017 Chart reviewed for concurrent status and case management needs.  Will continue to follow patient progress.  Discharge Planning: following for needs  Expected discharge date: October 26, 2017  Marcelle SmilingRhonda Wendt, BSN, [place]RN3, [place]CCM   [phone]   Expected Discharge Date:   (unknown)               Expected Discharge Plan:  Home/Self Care  In-House Referral:  Clinical Social Work  Discharge planning Services  CM Consult  Post Acute Care Choice:    Choice offered to:     DME Arranged:    DME Agency:     HH Arranged:    HH Agency:     Status of Service:  In process, will continue to follow  If discussed at Long Length of Stay Meetings, dates discussed:    Additional Comments:  Golda AcreDavis, Asjia Berrios Lynn, RN 10/23/2017, 9:40 AM

## 2017-10-23 NOTE — Consult Note (Signed)
[facility] Psychiatry Consult   Reason for Consult:  Alcohol intoxication and withdrawal, depression and suicide ideation Referring Physician:  Dr. Doyle Askew Patient Identification: Chase Moss MRN:  [id number] Principal Diagnosis: Alcohol intoxication [facility]) Diagnosis:   Patient Active Problem List   Diagnosis Date Noted  . Alcohol intoxication ([place]) [F10.929] 10/22/2017  . Alcohol withdrawal ([place]) [F10.239] 10/22/2017  . Polysubstance abuse ([place]) [F19.10]   . Major depressive disorder, recurrent, severe with psychotic features ([place]) [F33.3] 05/15/2017  . Cocaine abuse ([place]) [F14.10] 03/31/2017  . HTN (hypertension) [I10] 06/20/2016  . Major depressive disorder, recurrent severe without psychotic features ([place]) [F33.2] 06/20/2016  . Tobacco use disorder [F17.200] 06/20/2016  . Trauma [T14.90XA] 07/27/2015  . Cannabis use disorder, moderate, dependence ([place]) [F12.20]   . PTSD (post-traumatic stress disorder) [F43.10]   . Substance induced mood disorder ([place]) [F19.94] 07/07/2014  . Alcohol use disorder, moderate, dependence ([place]) [F10.20] 04/10/2012  . Suicidal thoughts [R45.851] 04/04/2012    Total Time spent with patient: 1 hour  Subjective:   Chase Moss is a 51 y.o. male patient admitted with bipolar depression and alcohol intoxication/withdrawal.  HPI:  Chase Moss is a 51 y.o. male with a hx of Bipolar disorder, depression, HTN, PTSD, HTN, polysubstance abuse presents to the Emergency Department complaining of gradual, persistent, progressively worsening nausea, vomiting and generalized abd pain onset 3 days ago when he attempted to stop drinking.  Pt reports over the last few months, he has attempted this several times each time developing this abdominal pain.  Patient reports his last drink was on 02/18/23 and he has been vomiting since that time.  He reports emesis is intermittently streaked with blood but no large clots or strict hematemesis.  Patient reports in an  effort to make the symptoms abate, he did have two 40 ounce beers about 3 hours ago.  Patient denies history of alcohol withdrawal seizures.  He reports that he has had one previous suicide attempt where he slit his left wrist.  Patient reports several previous stays in rehab.  He states that time he is currently suicidal with a plan to overdose on his sleeping medication.  He is requesting help for his psychiatric problems and his drinking problem. He states he has not been taking his psychiatric medicines for the last several months.    On evaluation and medical floor patient appeared awake, alert, oriented to time place person and situation and has increased psychomotor retardation.  Patient reported since he was admitted to the hospital floor he has been doing somewhat better with the IV fluids and has no nausea or vomiting or abdominal pain.  Patient stated that he has been depressed since his mother passed away in 2015-02-18 secondary to accident and his sister passed away 2017-01-18 and he has been struggling with depression and polysubstance abuse.  Patient was recently admitted to [facility] from May 15, 2017 to May 22, 2017 for major depressive disorder, recurrent, severe with psychotic symptoms.  Patient reportedly admitted for rehabilitation program ready for change and reportedly he was placed in a housing but has a lot of negative environment, people jumping on him and continue to be abusing drugs which he did not last more than 3 weeks.  Patient reportedly relapsed right after going home and continued to be drinking alcohol and also doing occasional drugs like cocaine and marijuana.  Patient reportedly noncompliant with outpatient medication management. Patient reportedly was exposed to a lot of trauma when he was young her growing  up and has been diagnosed with posttraumatic stress disorder and reports about nightmares and bad dreams which she is trying to use self medicate with more  drugs and alcohol.  Patient also endorses suicidal ideation, intention with the overdose and ongoing symptoms of depression.  Blood alcohol level is 107 on arrival and also urine drug screen is positive for cocaine and tetrahydrocannabinol.   Past Psychiatric History:  hx of Bipolar disorder, depression, HTN, PTSD, HTN, polysubstance abuse   Risk to Self: Is patient at risk for suicide?: Yes Risk to Others:   Prior Inpatient Therapy:   Prior Outpatient Therapy:    Past Medical History:  Past Medical History:  Diagnosis Date  . Bipolar 1 disorder ([place])   . Depression   . Hypertension   . PTSD (post-traumatic stress disorder)    History reviewed. No pertinent surgical history. Family History:  Family History  Problem Relation Age of Onset  . Heart attack Other    Family Psychiatric  History: Bipolar: Sister. Social History:  History  Alcohol Use  . 1.2 oz/week  . 2 Cans of beer per week    Comment: daily alcohol     History  Drug Use  . Types: Cocaine, Marijuana    Comment: several times a day- pt reports hasn't used since detoxic     Social History   Social History  . Marital status: Divorced    Spouse name: N/A  . Number of children: N/A  . Years of education: N/A   Social History Main Topics  . Smoking status: Current Every Day Smoker    Packs/day: 1.00    Years: 30.00    Types: Cigarettes  . Smokeless tobacco: Never Used  . Alcohol use 1.2 oz/week    2 Cans of beer per week     Comment: daily alcohol  . Drug use: Yes    Types: Cocaine, Marijuana     Comment: several times a day- pt reports hasn't used since detoxic   . Sexual activity: Yes   Other Topics Concern  . None   Social History Narrative  . None   Additional Social History:    Allergies:  No Known Allergies  Labs:  Results for orders placed or performed during the hospital encounter of 10/22/17 (from the past 48 hour(s))  Comprehensive metabolic panel     Status: Abnormal   Collection  Time: 10/22/17 12:51 AM  Result Value Ref Range   Sodium 137 135 - 145 mmol/L   Potassium 3.7 3.5 - 5.1 mmol/L   Chloride 97 (L) 101 - 111 mmol/L   CO2 27 22 - 32 mmol/L   Glucose, Bld 127 (H) 65 - 99 mg/dL   BUN 11 6 - 20 mg/dL   Creatinine, Ser 1.02 0.61 - 1.24 mg/dL   Calcium 9.2 8.9 - 10.3 mg/dL   Total Protein 7.7 6.5 - 8.1 g/dL   Albumin 4.5 3.5 - 5.0 g/dL   AST 19 15 - 41 U/L   ALT 16 (L) 17 - 63 U/L   Alkaline Phosphatase 73 38 - 126 U/L   Total Bilirubin 0.7 0.3 - 1.2 mg/dL   GFR calc non Af Amer >60 >60 mL/min   GFR calc Af Amer >60 >60 mL/min    Comment: (NOTE) The eGFR has been calculated using the CKD EPI equation. This calculation has not been validated in all clinical situations. eGFR's persistently <60 mL/min signify possible Chronic Kidney Disease.    Anion gap 13 5 -  15  Ethanol     Status: Abnormal   Collection Time: 10/22/17 12:51 AM  Result Value Ref Range   Alcohol, Ethyl (B) 107 (H) <10 mg/dL    Comment:        LOWEST DETECTABLE LIMIT FOR SERUM ALCOHOL IS 10 mg/dL FOR MEDICAL PURPOSES ONLY   Salicylate level     Status: None   Collection Time: 10/22/17 12:51 AM  Result Value Ref Range   Salicylate Lvl <6.3 2.8 - 30.0 mg/dL  Acetaminophen level     Status: Abnormal   Collection Time: 10/22/17 12:51 AM  Result Value Ref Range   Acetaminophen (Tylenol), Serum <10 (L) 10 - 30 ug/mL    Comment:        THERAPEUTIC CONCENTRATIONS VARY SIGNIFICANTLY. A RANGE OF 10-30 ug/mL MAY BE AN EFFECTIVE CONCENTRATION FOR MANY PATIENTS. HOWEVER, SOME ARE BEST TREATED AT CONCENTRATIONS OUTSIDE THIS RANGE. ACETAMINOPHEN CONCENTRATIONS >150 ug/mL AT 4 HOURS AFTER INGESTION AND >50 ug/mL AT 12 HOURS AFTER INGESTION ARE OFTEN ASSOCIATED WITH TOXIC REACTIONS.   cbc     Status: None   Collection Time: 10/22/17 12:51 AM  Result Value Ref Range   WBC 8.6 4.0 - 10.5 K/uL   RBC 5.33 4.22 - 5.81 MIL/uL   Hemoglobin 16.6 13.0 - 17.0 g/dL   HCT 48.6 39.0 - 52.0 %    MCV 91.2 78.0 - 100.0 fL   MCH 31.1 26.0 - 34.0 pg   MCHC 34.2 30.0 - 36.0 g/dL   RDW 13.4 11.5 - 15.5 %   Platelets 273 150 - 400 K/uL  Lipase, blood     Status: None   Collection Time: 10/22/17 12:51 AM  Result Value Ref Range   Lipase 38 11 - 51 U/L  Rapid urine drug screen (hospital performed)     Status: Abnormal   Collection Time: 10/22/17  2:00 AM  Result Value Ref Range   Opiates NONE DETECTED NONE DETECTED   Cocaine POSITIVE (A) NONE DETECTED   Benzodiazepines NONE DETECTED NONE DETECTED   Amphetamines NONE DETECTED NONE DETECTED   Tetrahydrocannabinol POSITIVE (A) NONE DETECTED   Barbiturates NONE DETECTED NONE DETECTED    Comment:        DRUG SCREEN FOR MEDICAL PURPOSES ONLY.  IF CONFIRMATION IS NEEDED FOR ANY PURPOSE, NOTIFY LAB WITHIN 5 DAYS.        LOWEST DETECTABLE LIMITS FOR URINE DRUG SCREEN Drug Class       Cutoff (ng/mL) Amphetamine      1000 Barbiturate      200 Benzodiazepine   875 Tricyclics       643 Opiates          300 Cocaine          300 THC              50   Comprehensive metabolic panel     Status: Abnormal   Collection Time: 10/23/17  6:11 AM  Result Value Ref Range   Sodium 139 135 - 145 mmol/L   Potassium 3.4 (L) 3.5 - 5.1 mmol/L   Chloride 106 101 - 111 mmol/L   CO2 24 22 - 32 mmol/L   Glucose, Bld 134 (H) 65 - 99 mg/dL   BUN 13 6 - 20 mg/dL   Creatinine, Ser 1.19 0.61 - 1.24 mg/dL   Calcium 8.4 (L) 8.9 - 10.3 mg/dL   Total Protein 5.8 (L) 6.5 - 8.1 g/dL   Albumin 3.2 (L) 3.5 - 5.0 g/dL  AST 23 15 - 41 U/L   ALT 12 (L) 17 - 63 U/L   Alkaline Phosphatase 58 38 - 126 U/L   Total Bilirubin 0.5 0.3 - 1.2 mg/dL   GFR calc non Af Amer >60 >60 mL/min   GFR calc Af Amer >60 >60 mL/min    Comment: (NOTE) The eGFR has been calculated using the CKD EPI equation. This calculation has not been validated in all clinical situations. eGFR's persistently <60 mL/min signify possible Chronic Kidney Disease.    Anion gap 9 5 - 15  CBC      Status: None   Collection Time: 10/23/17  6:11 AM  Result Value Ref Range   WBC 6.3 4.0 - 10.5 K/uL   RBC 4.85 4.22 - 5.81 MIL/uL   Hemoglobin 14.7 13.0 - 17.0 g/dL   HCT 45.1 39.0 - 52.0 %   MCV 93.0 78.0 - 100.0 fL   MCH 30.3 26.0 - 34.0 pg   MCHC 32.6 30.0 - 36.0 g/dL   RDW 13.5 11.5 - 15.5 %   Platelets 185 150 - 400 K/uL  Magnesium     Status: None   Collection Time: 10/23/17  6:11 AM  Result Value Ref Range   Magnesium 1.8 1.7 - 2.4 mg/dL  Phosphorus     Status: None   Collection Time: 10/23/17  6:11 AM  Result Value Ref Range   Phosphorus 3.2 2.5 - 4.6 mg/dL  Ethanol     Status: None   Collection Time: 10/23/17  6:11 AM  Result Value Ref Range   Alcohol, Ethyl (B) <10 <10 mg/dL    Comment:        LOWEST DETECTABLE LIMIT FOR SERUM ALCOHOL IS 10 mg/dL FOR MEDICAL PURPOSES ONLY     Current Facility-Administered Medications  Medication Dose Route Frequency Provider Last Rate Last Dose  . 0.9 %  sodium chloride infusion   Intravenous Continuous Theodis Blaze, MD 75 mL/hr at 10/22/17 1759    . chlorhexidine (PERIDEX) 0.12 % solution 15 mL  15 mL Mouth Rinse BID Elwin Mocha, MD   15 mL at 10/23/17 1049  . cloNIDine (CATAPRES) tablet 0.1 mg  0.1 mg Oral Q6H PRN Jani Gravel, MD      . enoxaparin (LOVENOX) injection 40 mg  40 mg Subcutaneous Q24H Jani Gravel, MD   40 mg at 10/22/17 2310  . LORazepam (ATIVAN) injection 0-4 mg  0-4 mg Intravenous Q6H Muthersbaugh, Hannah, PA-C   1 mg at 10/22/17 1828   Or  . LORazepam (ATIVAN) tablet 0-4 mg  0-4 mg Oral Q6H Muthersbaugh, Hannah, PA-C   1 mg at 10/23/17 1049  . [START ON 10/24/2017] LORazepam (ATIVAN) injection 0-4 mg  0-4 mg Intravenous Q12H Muthersbaugh, Hannah, PA-C       Or  . Derrill Memo ON 10/24/2017] LORazepam (ATIVAN) tablet 0-4 mg  0-4 mg Oral Q12H Muthersbaugh, Hannah, PA-C      . MEDLINE mouth rinse  15 mL Mouth Rinse q12n4p Elwin Mocha, MD   15 mL at 10/22/17 1801  . pantoprazole (PROTONIX) injection 40 mg  40 mg  Intravenous Q12H Theodis Blaze, MD      . potassium chloride SA (K-DUR,KLOR-CON) CR tablet 40 mEq  40 mEq Oral Once Theodis Blaze, MD      . thiamine (VITAMIN B-1) tablet 100 mg  100 mg Oral Daily Muthersbaugh, Jarrett Soho, PA-C   100 mg at 10/23/17 1049    Musculoskeletal: Strength & Muscle Tone: decreased  Gait & Station: unable to stand Patient leans: N/A  Psychiatric Specialty Exam: Physical Exam spell history and physical  Review of Systems  Constitutional: Positive for diaphoresis and weight loss.  Gastrointestinal: Positive for abdominal pain, nausea and vomiting.  Neurological: Positive for weakness.  Psychiatric/Behavioral: Positive for depression, substance abuse and suicidal ideas. The patient is nervous/anxious and has insomnia.     Patient presented with nausea, vomiting, abdominal pain and self-medicating with more alcohol and drugs, recently relapsed noncompliant with outpatient medication management.  Patient denied chest pain and shortness of breath.    Blood pressure (!) 148/88, pulse 69, temperature 97.8 F (36.6 C), temperature source Oral, resp. rate 18, height 6' (1.829 m), weight 90.7 kg (200 lb), SpO2 99 %.Body mass index is 27.12 kg/m.  General Appearance: Disheveled and Guarded  Eye Contact:  Good  Speech:  Clear and Coherent and Slow  Volume:  Decreased  Mood:  Depressed, Hopeless and Worthless  Affect:  Constricted and Depressed  Thought Process:  Coherent and Goal Directed  Orientation:  Full (Time, Place, and Person)  Thought Content:  Rumination  Suicidal Thoughts:  Yes.  with intent/plan  Homicidal Thoughts:  No  Memory:  Immediate;   Good Recent;   Good Remote;   Good  Judgement:  Impaired  Insight:  Fair  Psychomotor Activity:  Decreased  Concentration:  Concentration: Fair and Attention Span: Fair  Recall:  Good  Fund of Knowledge:  Good  Language:  Good  Akathisia:  Negative  Handed:  Right  AIMS (if indicated):     Assets:   Communication Skills Desire for Improvement Financial Resources/Insurance Housing Leisure Time Resilience Social Support Transportation  ADL's:  Impaired  Cognition:  WNL  Sleep:        Treatment Plan Summary: 51 years old male with a history of bipolar depression, polysubstance abuse, failed to complete rehabilitation, and relapsed drug of abuse since May 2018.  Patient presented to the hospital with alcoholic abuse with intoxication, withdrawal symptoms and increased symptoms of depression and suicidal ideation with the plan.  Alcohol abuse with intoxication, history of alcohol dependence Polysubstance abuse [alcohol, cocaine and marijuana] History of bipolar depression and posttraumatic stress disorder None compliant noncompliant with outpatient medication management  Recommendation: Continue safety sitter as patient has suicidal ideation with intention to overdose medication Patient benefit from [facility] detox protocol Patient benefit from [facility] for the rehabilitation when medically and psychiatrically stable May start medication Minipress 2 mg at bedtime for nightmares, Abilify 5 mg twice daily for mood swings and Remeron 7.5 mg at bedtime for insomnia Daily contact with patient to assess and evaluate symptoms and progress in treatment and Medication management  Please contact the unit to CSW for inpatient psychiatric bed availability when medically stable. Appreciate psychiatric consultation and follow up as clinically required Please contact [phone] or [phone] if needs further assistance   Disposition: Recommend psychiatric Inpatient admission when medically cleared. Supportive therapy provided about ongoing stressors.  Ambrose Finland, MD 10/23/2017 11:58 AM

## 2017-10-27 NOTE — Discharge Summary (Signed)
Physician Discharge Summary  Chase Moss KGM:[id number] DOB: 08-30-1966 DOA: 10/22/2017  PCP: Jackie Plum, MD  Admit date: 10/22/2017 Discharge date: 10/23/2017  Recommendations for Outpatient Follow-up:  I was called from RN that pt has left the floor AMA.   Discharge Diagnoses:  Principal Problem:   Alcohol intoxication (HCC) Active Problems:   Cannabis use disorder, moderate, dependence (HCC)   Cocaine abuse (HCC)   Alcohol withdrawal (HCC)  Discharge Condition: pt left AMA  Diet recommendation: pt left AMA  History of present illness:  Pt is 51 yo male with known polysubstance abuse, alcohol, cocaine, THC, presented intoxicated, AMS and unable to provide history. Per report pt was suicidal and was planning to take sleeping pills.  Assessment & Plan:   Principal Problem:   Alcohol intoxication (HCC) - mental status more clear but still sluggish - has denies SI in am, psych consulted and seen pt - pt has however left the floor by the time I was notified of him leaving, per RN report family came to pick up  - per RN, pt has pulled lines out and said he had to go, family apparently agreed to take him home per report   Active Problems:   Cannabis use disorder, moderate, dependence (HCC)   Cocaine abuse (HCC)   Alcohol withdrawal (HCC) - PSA, I have discussed cessation with pt    Suicidal ideations  - has denies SI this AM     Hypokalemia  - supplemented    CT findings of Mild gastritis/ duodenitis  - with small suspected non perforated ulcer - also mildly thickened esophagus without inflammation, possible esophagitis - pt left AMA   Consultants:   Psych   Procedures:   None  Antimicrobials:   None  Procedures/Studies: Ct Abdomen Pelvis W Contrast  Result Date: 10/22/2017 CLINICAL DATA:  Abdominal distension, nausea and vomiting. History of alcohol and substance abuse, hypertension. Off medication for 2 months. EXAM: CT ABDOMEN AND  PELVIS WITH CONTRAST TECHNIQUE: Multidetector CT imaging of the abdomen and pelvis was performed using the standard protocol following bolus administration of intravenous contrast. CONTRAST:  ISOVUE-300 IOPAMIDOL (ISOVUE-300) INJECTION 61% COMPARISON:  None. FINDINGS: LOWER CHEST: Dependent atelectasis. Heart size is upper limits of normal. No pericardial effusion. Mild coronary artery calcifications. Mildly thickened esophagus. HEPATOBILIARY: Liver and gallbladder are normal. PANCREAS: Normal. SPLEEN: Normal. ADRENALS/URINARY TRACT: Kidneys are orthotopic, demonstrating symmetric enhancement. No nephrolithiasis, hydronephrosis or solid renal masses. The unopacified ureters are normal in course and caliber. Delayed imaging through the kidneys demonstrates symmetric prompt contrast excretion within the proximal urinary collecting system. Urinary bladder is well distended and unremarkable. Normal adrenal glands. STOMACH/BOWEL: Mild gastric antral wall thickening first portion of the duodenum subcentimeter probable ulcer with probable gas (coronal 52/127) and inflammation. The small and large bowel are normal in course and caliber without inflammatory changes, sensitivity decreased without oral contrast. Normal appendix. VASCULAR/LYMPHATIC: Aortoiliac vessels are normal in course and caliber. Mild calcific atherosclerosis. No lymphadenopathy by CT size criteria. REPRODUCTIVE: Normal. OTHER: No intraperitoneal free fluid or free air. MUSCULOSKELETAL: Nonacute. Small fat containing umbilical hernia. 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 IMPRESSION: 1. Mild gastritis/ duodenitis with small suspected non perforated ulcer. 2. Mildly thickened esophagus without inflammation, possible esophagitis. 3. No cirrhosis or ascites. Electronically Signed   By: Awilda Metro M.D.   On: 10/22/2017 04:48    Discharge Exam: Vitals:   10/23/17 1049 10/23/17 1457  BP: (!)  148/88 (!) 143/88  Pulse:  69 (!) 59  Resp:    Temp:  98.3 F (36.8 C)  SpO2:  97%   Vitals:   10/22/17 1436 10/22/17 2145 10/23/17 1049 10/23/17 1457  BP: (!) 151/101 (!) 157/98 (!) 148/88 (!) 143/88  Pulse: 66 68 69 (!) 59  Resp: 18 18    Temp:  97.8 F (36.6 C)  98.3 F (36.8 C)  TempSrc:  Oral  Oral  SpO2: 98% 99%  97%  Weight:      Height:        Physical exam not done as by the time I was notified, pt already left AMA  Discharge Instructions   Allergies as of 10/23/2017   No Known Allergies     Medication List    ASK your doctor about these medications   ARIPiprazole 20 MG tablet Commonly known as:  ABILIFY Take 1 tablet (20 mg total) by mouth daily. For mood control   atenolol 50 MG tablet Commonly known as:  TENORMIN Take 1 tablet (50 mg total) by mouth daily. For high blood pressure   buPROPion 300 MG 24 hr tablet Commonly known as:  WELLBUTRIN XL Take 1 tablet (300 mg total) by mouth daily. For depression   hydrOXYzine 25 MG tablet Commonly known as:  ATARAX/VISTARIL Take 1 tablet (25 mg total) by mouth every 4 (four) hours as needed for anxiety (Sleep).   lisinopril 5 MG tablet Commonly known as:  PRINIVIL,ZESTRIL Take 1 tablet (5 mg total) by mouth daily. For high blood pressure   mirtazapine 7.5 MG tablet Commonly known as:  REMERON Take 1 tablet (7.5 mg total) by mouth at bedtime. For sleep   pantoprazole 40 MG tablet Commonly known as:  PROTONIX Take 1 tablet (40 mg total) by mouth daily. For acid reflux   pravastatin 40 MG tablet Commonly known as:  PRAVACHOL Take 1 tablet (40 mg total) by mouth every evening. For high cholesterol   prazosin 5 MG capsule Commonly known as:  MINIPRESS Take 1 capsule (5 mg total) by mouth at bedtime. For nightmares         The results of significant diagnostics from this hospitalization (including imaging, microbiology, ancillary and laboratory) are listed below for reference.      Microbiology: No results found for this or any previous visit (from the past 240 hour(s)).   Labs: Basic Metabolic Panel: Recent Labs  Lab 10/22/17 0051 10/23/17 0611  NA 137 139  K 3.7 3.4*  CL 97* 106  CO2 27 24  GLUCOSE 127* 134*  BUN 11 13  CREATININE 1.02 1.19  CALCIUM 9.2 8.4*  MG  --  1.8  PHOS  --  3.2   Liver Function Tests: Recent Labs  Lab 10/22/17 0051 10/23/17 0611  AST 19 23  ALT 16* 12*  ALKPHOS 73 58  BILITOT 0.7 0.5  PROT 7.7 5.8*  ALBUMIN 4.5 3.2*   Recent Labs  Lab 10/22/17 0051  LIPASE 38   No results for input(s): AMMONIA in the last 168 hours. CBC: Recent Labs  Lab 10/22/17 0051 10/23/17 0611  WBC 8.6 6.3  HGB 16.6 14.7  HCT 48.6 45.1  MCV 91.2 93.0  PLT 273 185     SIGNED: Time coordinating discharge:  Debbora PrestoIskra Magick-Lorelee Mclaurin, MD  [facility] 10/27/2017, 1:30 PM Pager [phone]  If 7PM-7AM, please contact night-coverage [email or web address] Password [email or web address]

## 2017-11-05 ENCOUNTER — Other Ambulatory Visit: Payer: Self-pay

## 2017-11-05 ENCOUNTER — Encounter (HOSPITAL_COMMUNITY): Payer: Self-pay | Admitting: Emergency Medicine

## 2017-11-05 ENCOUNTER — Inpatient Hospital Stay (HOSPITAL_COMMUNITY)
Admission: EM | Admit: 2017-11-05 | Discharge: 2017-11-08 | DRG: 918 | Disposition: A | Discharge status: Other Acute Inpatient Hospital | Payer: Medicaid Other | Attending: Internal Medicine | Admitting: Internal Medicine

## 2017-11-05 DIAGNOSIS — G4710 Hypersomnia, unspecified: Secondary | ICD-10-CM | POA: Diagnosis present

## 2017-11-05 DIAGNOSIS — Z8249 Family history of ischemic heart disease and other diseases of the circulatory system: Secondary | ICD-10-CM

## 2017-11-05 DIAGNOSIS — T50902A Poisoning by unspecified drugs, medicaments and biological substances, intentional self-harm, initial encounter: Secondary | ICD-10-CM

## 2017-11-05 DIAGNOSIS — F1210 Cannabis abuse, uncomplicated: Secondary | ICD-10-CM | POA: Diagnosis present

## 2017-11-05 DIAGNOSIS — I10 Essential (primary) hypertension: Secondary | ICD-10-CM | POA: Diagnosis present

## 2017-11-05 DIAGNOSIS — T510X2A Toxic effect of ethanol, intentional self-harm, initial encounter: Secondary | ICD-10-CM | POA: Diagnosis present

## 2017-11-05 DIAGNOSIS — F10229 Alcohol dependence with intoxication, unspecified: Secondary | ICD-10-CM | POA: Diagnosis present

## 2017-11-05 DIAGNOSIS — E785 Hyperlipidemia, unspecified: Secondary | ICD-10-CM | POA: Diagnosis present

## 2017-11-05 DIAGNOSIS — F10929 Alcohol use, unspecified with intoxication, unspecified: Secondary | ICD-10-CM | POA: Diagnosis present

## 2017-11-05 DIAGNOSIS — Z79899 Other long term (current) drug therapy: Secondary | ICD-10-CM

## 2017-11-05 DIAGNOSIS — T43022A Poisoning by tetracyclic antidepressants, intentional self-harm, initial encounter: Principal | ICD-10-CM | POA: Diagnosis present

## 2017-11-05 DIAGNOSIS — F4310 Post-traumatic stress disorder, unspecified: Secondary | ICD-10-CM | POA: Diagnosis present

## 2017-11-05 DIAGNOSIS — R45850 Homicidal ideations: Secondary | ICD-10-CM | POA: Diagnosis present

## 2017-11-05 DIAGNOSIS — T512X2A Toxic effect of 2-Propanol, intentional self-harm, initial encounter: Secondary | ICD-10-CM | POA: Diagnosis present

## 2017-11-05 DIAGNOSIS — F10920 Alcohol use, unspecified with intoxication, uncomplicated: Secondary | ICD-10-CM | POA: Insufficient documentation

## 2017-11-05 DIAGNOSIS — R45851 Suicidal ideations: Secondary | ICD-10-CM

## 2017-11-05 DIAGNOSIS — T50901A Poisoning by unspecified drugs, medicaments and biological substances, accidental (unintentional), initial encounter: Secondary | ICD-10-CM | POA: Diagnosis present

## 2017-11-05 DIAGNOSIS — F1910 Other psychoactive substance abuse, uncomplicated: Secondary | ICD-10-CM | POA: Diagnosis present

## 2017-11-05 DIAGNOSIS — F1410 Cocaine abuse, uncomplicated: Secondary | ICD-10-CM | POA: Diagnosis present

## 2017-11-05 DIAGNOSIS — T528X4A Toxic effect of other organic solvents, undetermined, initial encounter: Secondary | ICD-10-CM

## 2017-11-05 DIAGNOSIS — T6592XA Toxic effect of unspecified substance, intentional self-harm, initial encounter: Secondary | ICD-10-CM | POA: Diagnosis present

## 2017-11-05 DIAGNOSIS — F332 Major depressive disorder, recurrent severe without psychotic features: Secondary | ICD-10-CM

## 2017-11-05 DIAGNOSIS — F17210 Nicotine dependence, cigarettes, uncomplicated: Secondary | ICD-10-CM | POA: Diagnosis present

## 2017-11-05 LAB — CBC WITH DIFFERENTIAL/PLATELET
BASOS ABS: 0 10*3/uL (ref 0.0–0.1)
BASOS PCT: 0 %
EOS PCT: 1 %
Eosinophils Absolute: 0.1 10*3/uL (ref 0.0–0.7)
HCT: 48.2 % (ref 39.0–52.0)
Hemoglobin: 16.3 g/dL (ref 13.0–17.0)
Lymphocytes Relative: 44 %
Lymphs Abs: 2.8 10*3/uL (ref 0.7–4.0)
MCH: 31.1 pg (ref 26.0–34.0)
MCHC: 33.8 g/dL (ref 30.0–36.0)
MCV: 92 fL (ref 78.0–100.0)
MONO ABS: 0.4 10*3/uL (ref 0.1–1.0)
MONOS PCT: 6 %
Neutro Abs: 3.1 10*3/uL (ref 1.7–7.7)
Neutrophils Relative %: 49 %
PLATELETS: 209 10*3/uL (ref 150–400)
RBC: 5.24 MIL/uL (ref 4.22–5.81)
RDW: 13.6 % (ref 11.5–15.5)
WBC: 6.3 10*3/uL (ref 4.0–10.5)

## 2017-11-05 LAB — COMPREHENSIVE METABOLIC PANEL
ALBUMIN: 4.2 g/dL (ref 3.5–5.0)
ALBUMIN: 4 g/dL (ref 3.5–5.0)
ALK PHOS: 71 U/L (ref 38–126)
ALK PHOS: 67 U/L (ref 38–126)
ALT: 23 U/L (ref 17–63)
ALT: 22 U/L (ref 17–63)
AST: 24 U/L (ref 15–41)
AST: 25 U/L (ref 15–41)
Anion gap: 12 (ref 5–15)
Anion gap: 9 (ref 5–15)
BILIRUBIN TOTAL: 0.8 mg/dL (ref 0.3–1.2)
BILIRUBIN TOTAL: 0.4 mg/dL (ref 0.3–1.2)
BUN: 13 mg/dL (ref 6–20)
BUN: 14 mg/dL (ref 6–20)
CALCIUM: 8.8 mg/dL — AB (ref 8.9–10.3)
CALCIUM: 8.5 mg/dL — AB (ref 8.9–10.3)
CO2: 23 mmol/L (ref 22–32)
CO2: 24 mmol/L (ref 22–32)
CREATININE: 1 mg/dL (ref 0.61–1.24)
Chloride: 106 mmol/L (ref 101–111)
Chloride: 108 mmol/L (ref 101–111)
Creatinine, Ser: 0.95 mg/dL (ref 0.61–1.24)
GFR calc Af Amer: >60 mL/min (ref >60)
GFR calc Af Amer: >60 mL/min (ref >60)
GFR calc non Af Amer: >60 mL/min (ref >60)
GFR calc non Af Amer: >60 mL/min (ref >60)
GLUCOSE: 94 mg/dL (ref 65–99)
GLUCOSE: 85 mg/dL (ref 65–99)
Potassium: 3.5 mmol/L (ref 3.5–5.1)
Potassium: 3.9 mmol/L (ref 3.5–5.1)
SODIUM: 141 mmol/L (ref 135–145)
SODIUM: 141 mmol/L (ref 135–145)
TOTAL PROTEIN: 7.5 g/dL (ref 6.5–8.1)
TOTAL PROTEIN: 7 g/dL (ref 6.5–8.1)

## 2017-11-05 LAB — OSMOLALITY: OSMOLALITY: 347 mosm/kg — AB (ref 275–295)

## 2017-11-05 LAB — VOLATILES,BLD-ACETONE,ETHANOL,ISOPROP,METHANOL
ACETONE, BLOOD: NEGATIVE % (ref 0.000–0.010)
ETHANOL, BLOOD: 0.078 % — AB (ref 0.000–0.010)
Isopropanol, blood: NEGATIVE % (ref 0.000–0.010)
Methanol, blood: NEGATIVE % (ref 0.000–0.010)

## 2017-11-05 LAB — CBC
HEMATOCRIT: 51 % (ref 39.0–52.0)
HEMOGLOBIN: 17.4 g/dL — AB (ref 13.0–17.0)
MCH: 31.5 pg (ref 26.0–34.0)
MCHC: 34.1 g/dL (ref 30.0–36.0)
MCV: 92.2 fL (ref 78.0–100.0)
Platelets: 213 10*3/uL (ref 150–400)
RBC: 5.53 MIL/uL (ref 4.22–5.81)
RDW: 13.3 % (ref 11.5–15.5)
WBC: 7.9 10*3/uL (ref 4.0–10.5)

## 2017-11-05 LAB — URINALYSIS, ROUTINE W REFLEX MICROSCOPIC
Bacteria, UA: NONE SEEN
Bilirubin Urine: NEGATIVE
Glucose, UA: NEGATIVE mg/dL
KETONES UR: NEGATIVE mg/dL
LEUKOCYTES UA: NEGATIVE
Nitrite: NEGATIVE
PROTEIN: NEGATIVE mg/dL
Specific Gravity, Urine: 1.006 (ref 1.005–1.030)
pH: 5 (ref 5.0–8.0)

## 2017-11-05 LAB — RAPID URINE DRUG SCREEN, HOSP PERFORMED
Amphetamines: NOT DETECTED
BARBITURATES: NOT DETECTED
Benzodiazepines: NOT DETECTED
Cocaine: POSITIVE — AB
Opiates: NOT DETECTED
TETRAHYDROCANNABINOL: POSITIVE — AB

## 2017-11-05 LAB — ETHYLENE GLYCOL: Ethylene Glycol Lvl: NOT DETECTED mg/dL

## 2017-11-05 LAB — TSH: TSH: 0.99 u[IU]/mL (ref 0.350–4.500)

## 2017-11-05 LAB — ACETAMINOPHEN LEVEL: Acetaminophen (Tylenol), Serum: <10 ug/mL — ABNORMAL LOW (ref 10–30)

## 2017-11-05 LAB — ETHANOL: Alcohol, Ethyl (B): 190 mg/dL — ABNORMAL HIGH (ref <10)

## 2017-11-05 LAB — SALICYLATE LEVEL: Salicylate Lvl: <7 mg/dL (ref 2.8–30.0)

## 2017-11-05 MED ORDER — THIAMINE HCL 100 MG/ML IJ SOLN: 100.0000 mg | Freq: Every day | INTRAMUSCULAR | Status: DC

## 2017-11-05 MED ORDER — SODIUM CHLORIDE 0.9 % IV SOLN
15.0000 mg/kg | Freq: Once | INTRAVENOUS | Status: AC
  Administered 2017-11-05: 1360 mg via INTRAVENOUS
  Filled 2017-11-05: qty 1.36

## 2017-11-05 MED ORDER — LORAZEPAM 2 MG/ML IJ SOLN: 1.0000 mg | Freq: Four times a day (QID) | INTRAMUSCULAR | Status: AC | PRN

## 2017-11-05 MED ORDER — FOLIC ACID 1 MG PO TABS
1.0000 mg | ORAL_TABLET | Freq: Every day | ORAL | Status: DC
  Administered 2017-11-05 – 2017-11-08 (×4): 1 mg via ORAL
  Filled 2017-11-05 (×4): qty 1

## 2017-11-05 MED ORDER — SODIUM CHLORIDE 0.9 % IV SOLN: 1350.0000 mg | Freq: Two times a day (BID) | INTRAVENOUS | Status: DC

## 2017-11-05 MED ORDER — SODIUM CHLORIDE 0.9 % IV SOLN: 15.0000 mg/kg | Freq: Two times a day (BID) | INTRAVENOUS | Status: DC

## 2017-11-05 MED ORDER — SODIUM CHLORIDE 0.9 % IV SOLN
50.0000 mg | Freq: Once | INTRAVENOUS | Status: AC
  Administered 2017-11-05: 50 mg via INTRAVENOUS
  Filled 2017-11-05: qty 10

## 2017-11-05 MED ORDER — PYRIDOXINE HCL 100 MG/ML IJ SOLN
100.0000 mg | Freq: Once | INTRAMUSCULAR | Status: AC
  Administered 2017-11-05: 100 mg via INTRAVENOUS
  Filled 2017-11-05: qty 1

## 2017-11-05 MED ORDER — SODIUM CHLORIDE 0.9 % IV SOLN
INTRAVENOUS | Status: AC
  Administered 2017-11-05 (×2): via INTRAVENOUS

## 2017-11-05 MED ORDER — VITAMIN B-1 100 MG PO TABS
100.0000 mg | ORAL_TABLET | Freq: Every day | ORAL | Status: DC
  Administered 2017-11-05 – 2017-11-08 (×4): 100 mg via ORAL
  Filled 2017-11-05 (×4): qty 1

## 2017-11-05 MED ORDER — HYDRALAZINE HCL 20 MG/ML IJ SOLN
10.0000 mg | INTRAMUSCULAR | Status: DC | PRN
  Administered 2017-11-06: 10 mg via INTRAVENOUS
  Filled 2017-11-05: qty 1

## 2017-11-05 MED ORDER — LORAZEPAM 1 MG PO TABS
0.0000 mg | ORAL_TABLET | Freq: Two times a day (BID) | ORAL | Status: DC
  Administered 2017-11-07: 1 mg via ORAL
  Filled 2017-11-05: qty 1

## 2017-11-05 MED ORDER — SODIUM CHLORIDE 0.9 % IV SOLN
10.0000 mg/kg | Freq: Two times a day (BID) | INTRAVENOUS | Status: DC
  Filled 2017-11-05: qty 0.78

## 2017-11-05 MED ORDER — SODIUM CHLORIDE 0.9 % IV SOLN
900.0000 mg | Freq: Two times a day (BID) | INTRAVENOUS | Status: DC
  Filled 2017-11-05: qty 0.9

## 2017-11-05 MED ORDER — LORAZEPAM 1 MG PO TABS
0.0000 mg | ORAL_TABLET | Freq: Four times a day (QID) | ORAL | Status: AC
  Administered 2017-11-05 – 2017-11-06 (×2): 1 mg via ORAL
  Filled 2017-11-05 (×2): qty 1

## 2017-11-05 MED ORDER — ADULT MULTIVITAMIN W/MINERALS CH
1.0000 | ORAL_TABLET | Freq: Every day | ORAL | Status: DC
  Administered 2017-11-05 – 2017-11-08 (×4): 1 via ORAL
  Filled 2017-11-05 (×4): qty 1

## 2017-11-05 MED ORDER — THIAMINE HCL 100 MG/ML IJ SOLN
100.0000 mg | Freq: Once | INTRAMUSCULAR | Status: AC
  Administered 2017-11-05: 100 mg via INTRAVENOUS

## 2017-11-05 MED ORDER — LORAZEPAM 1 MG PO TABS
1.0000 mg | ORAL_TABLET | Freq: Four times a day (QID) | ORAL | Status: AC | PRN
  Administered 2017-11-06 – 2017-11-07 (×3): 1 mg via ORAL
  Filled 2017-11-05 (×3): qty 1

## 2017-11-05 MED ORDER — AMLODIPINE BESYLATE 5 MG PO TABS
5.0000 mg | ORAL_TABLET | Freq: Every day | ORAL | Status: DC
  Administered 2017-11-05 – 2017-11-06 (×2): 5 mg via ORAL
  Filled 2017-11-05 (×2): qty 1

## 2017-11-05 MED ORDER — ALUM & MAG HYDROXIDE-SIMETH 200-200-20 MG/5ML PO SUSP
30.0000 mL | ORAL | Status: DC | PRN
  Administered 2017-11-05: 30 mL via ORAL
  Filled 2017-11-05: qty 30

## 2017-11-05 NOTE — Progress Notes (Signed)
Updated Poison Control Center with Methanol and Ethylene glycol levels, which were both negative. Per the poison control center representative, the IV Fomepizole can be discontinued. MD made aware and she states she will discontinue the order for the Fomepizole.   Arta BruceDeutsch, Reva [facility] 11/05/2017 3:15 PM

## 2017-11-05 NOTE — ED Notes (Signed)
Admitting MD paged regarding pt's critical osmolality of 347

## 2017-11-05 NOTE — Progress Notes (Signed)
Fredna DowGregory Jansma is a 51 y.o. male with history of polysubstance abuse depression hypertension hyperlipidemia presents to the ER after patient states he has wanting detox from alcohol but also claimed that he is depressed and suicidal and had taken alcohol along with mouthwash, rubbing alcohol and possibly antifreeze and Remeron.  Plan:  1. Fomepizole, follow labs for ethylene glycol, isoprophyl alcohol and methanol.  2. On CIWA for alcohol withdrawal symptoms.  3. Psychiatry consult    Kathlen ModyVijaya Delicia Berens, MD [phone]

## 2017-11-05 NOTE — ED Notes (Signed)
Alician from [place] poison control contacted recommendation is to obtain CBC, CMP, salylaslic acid, tylenol, Toxic ETOH, [place] glycol, and methanolol levels Dr Judd Lienelo notified

## 2017-11-05 NOTE — Progress Notes (Signed)
Please call report to NaylorLisa at [phone] at [phone]. Thanks!

## 2017-11-05 NOTE — ED Notes (Signed)
Attempted to call report to receiving nurse. Receiving nurse with another patient. Will follow up.

## 2017-11-05 NOTE — ED Provider Notes (Signed)
[facility]-EMERGENCY DEPT Provider Note   CSN: [id number] Arrival date & time: 11/05/17  0121     History   Chief Complaint Chief Complaint  Patient presents with  . Suicidal  . Drug / Alcohol Assessment    drinking mouth was and antifreeze     HPI Chase Moss is a 51 y.o. male.  This patient is a 51 year old male with past medical history of bipolar, depression, PTSD, hypertension, and polysubstance abuse.  He presents today for evaluation of overdose.  He reports drinking mouthwash, alcohol, a small sip of rubbing alcohol, and taking multiple Remeron tablets in an attempt to harm himself.  He states that he did this at approximately 5:30 or 6:00 this evening.   The history is provided by the patient.  Drug / Alcohol Assessment  Primary symptoms include intoxication.  Primary symptoms include no confusion, no somnolence, no seizures. This is a new problem. Episode onset: 7 hours ago. The problem has not changed since onset.Suspected agents include alcohol.    Past Medical History:  Diagnosis Date  . Bipolar 1 disorder (HCC)   . Depression   . Hypertension   . PTSD (post-traumatic stress disorder)     Patient Active Problem List   Diagnosis Date Noted  . Alcohol intoxication (HCC) 10/22/2017  . Alcohol withdrawal (HCC) 10/22/2017  . Polysubstance abuse (HCC)   . Major depressive disorder, recurrent, severe with psychotic features (HCC) 05/15/2017  . Cocaine abuse (HCC) 03/31/2017  . HTN (hypertension) 06/20/2016  . Major depressive disorder, recurrent severe without psychotic features (HCC) 06/20/2016  . Tobacco use disorder 06/20/2016  . Trauma 07/27/2015  . Cannabis use disorder, moderate, dependence (HCC)   . PTSD (post-traumatic stress disorder)   . Substance induced mood disorder (HCC) 07/07/2014  . Alcohol use disorder, moderate, dependence (HCC) 04/10/2012  . Suicidal thoughts 04/04/2012    History reviewed. No pertinent surgical  history.     Home Medications    Prior to Admission medications   Medication Sig Start Date End Date Taking? Authorizing Provider  ARIPiprazole (ABILIFY) 20 MG tablet Take 1 tablet (20 mg total) by mouth daily. For mood control Patient not taking: Reported on 10/22/2017 05/23/17   Armandina StammerNwoko, Agnes I, NP  atenolol (TENORMIN) 50 MG tablet Take 1 tablet (50 mg total) by mouth daily. For high blood pressure Patient not taking: Reported on 10/22/2017 05/23/17   Armandina StammerNwoko, Agnes I, NP  buPROPion (WELLBUTRIN XL) 300 MG 24 hr tablet Take 1 tablet (300 mg total) by mouth daily. For depression Patient not taking: Reported on 10/22/2017 05/23/17   Armandina StammerNwoko, Agnes I, NP  hydrOXYzine (ATARAX/VISTARIL) 25 MG tablet Take 1 tablet (25 mg total) by mouth every 4 (four) hours as needed for anxiety (Sleep). Patient not taking: Reported on 10/22/2017 05/22/17   Armandina StammerNwoko, Agnes I, NP  lisinopril (PRINIVIL,ZESTRIL) 5 MG tablet Take 1 tablet (5 mg total) by mouth daily. For high blood pressure Patient not taking: Reported on 10/22/2017 05/23/17   Armandina StammerNwoko, Agnes I, NP  mirtazapine (REMERON) 7.5 MG tablet Take 1 tablet (7.5 mg total) by mouth at bedtime. For sleep Patient not taking: Reported on 10/22/2017 05/22/17   Armandina StammerNwoko, Agnes I, NP  pantoprazole (PROTONIX) 40 MG tablet Take 1 tablet (40 mg total) by mouth daily. For acid reflux Patient not taking: Reported on 10/22/2017 05/23/17   Armandina StammerNwoko, Agnes I, NP  pravastatin (PRAVACHOL) 40 MG tablet Take 1 tablet (40 mg total) by mouth every evening. For high cholesterol Patient not  taking: Reported on 10/22/2017 05/22/17   Armandina StammerNwoko, Agnes I, NP  prazosin (MINIPRESS) 5 MG capsule Take 1 capsule (5 mg total) by mouth at bedtime. For nightmares Patient not taking: Reported on 10/22/2017 05/22/17   Sanjuana KavaNwoko, Agnes I, NP    Family History Family History  Problem Relation Age of Onset  . Heart attack Other     Social History Social History   Tobacco Use  . Smoking status: Current Every Day Smoker     Packs/day: 1.00    Years: 30.00    Pack years: 30.00    Types: Cigarettes  . Smokeless tobacco: Never Used  Substance Use Topics  . Alcohol use: Yes    Alcohol/week: 1.2 oz    Types: 2 Cans of beer per week    Comment: daily alcohol  . Drug use: Yes    Types: Cocaine, Marijuana    Comment: several times a day- pt reports hasn't used since detoxic      Allergies   Patient has no known allergies.   Review of Systems Review of Systems  Neurological: Negative for seizures.  Psychiatric/Behavioral: Negative for confusion.  All other systems reviewed and are negative.    Physical Exam Updated Vital Signs BP 130/81   Pulse 70   Temp 97.8 F (36.6 C) (Oral)   Resp 19   SpO2 91%   Physical Exam  Constitutional: He is oriented to person, place, and time. He appears well-developed and well-nourished. No distress.  There is a strong odor of alcohol present.  HENT:  Head: Normocephalic and atraumatic.  Mouth/Throat: Oropharynx is clear and moist.  Eyes: EOM are normal. Pupils are equal, round, and reactive to light.  Neck: Normal range of motion. Neck supple.  Cardiovascular: Normal rate and regular rhythm. Exam reveals no friction rub.  No murmur heard. Pulmonary/Chest: Effort normal and breath sounds normal. No respiratory distress. He has no wheezes. He has no rales.  Abdominal: Soft. Bowel sounds are normal. He exhibits no distension. There is no tenderness.  Musculoskeletal: Normal range of motion. He exhibits no edema.  Neurological: He is alert and oriented to person, place, and time. No cranial nerve deficit. Coordination normal.  Neurologic exam is somewhat difficult secondary to level of intoxication, however he does move all extremities and appears to be intact.  Speech is somewhat slurred.  Skin: Skin is warm and dry. He is not diaphoretic.  Nursing note and vitals reviewed.    ED Treatments / Results  Labs (all labs ordered are listed, but only abnormal  results are displayed) Labs Reviewed  COMPREHENSIVE METABOLIC PANEL  ETHANOL  SALICYLATE LEVEL  ACETAMINOPHEN LEVEL  CBC  RAPID URINE DRUG SCREEN, HOSP PERFORMED  URINALYSIS, ROUTINE W REFLEX MICROSCOPIC    EKG  EKG Interpretation None       Radiology No results found.  Procedures Procedures (including critical care time)  Medications Ordered in ED Medications - No data to display   Initial Impression / Assessment and Plan / ED Course  I have reviewed the triage vital signs and the nursing notes.  Pertinent labs & imaging results that were available during my care of the patient were reviewed by me and considered in my medical decision making (see chart for details).  Patient with history of bipolar, depression, PTSD, polysubstance abuse, and alcoholism presenting with complaints of depression, suicidal ideation, and having overdosed on Remeron, mouthwash, rubbing alcohol, and ethylene glycol.  It is unclear to me as to whether he took any  or how much of each of these substances.  He is electrolytes are essentially unremarkable.  His blood alcohol is 190.  With his normal electrolytes, no crystals in his urine I highly doubt he actually took a significant quantity of ethylene glycol, however a level is pending.  At the advice of poison control, fomepazole treatment has been initiated pending the results of the initial study.  I have spoken with Dr. Toniann Fail who agrees to admit.  Final Clinical Impressions(s) / ED Diagnoses   Final diagnoses:  None    ED Discharge Orders    None       Geoffery Lyons, MD 11/05/17 [phone]

## 2017-11-05 NOTE — ED Notes (Signed)
Unable to collect labs patient wants labs pull from IV

## 2017-11-05 NOTE — ED Triage Notes (Signed)
Pt comes to ed via ems, Si c/o of trying to kill himself. Pt verbalized to ems he drank a shot of anti-freeze and full bottle of mouth wash and 2nd almost finished bottle of mouth wash. Time tables 1800-1830 time of ingestion. Alert x4. V/s on arrival 138/96, pluse 80. rr 20. sp02 98, cbg 113. Iv 18 LAC. C/o of nausea and vomiting. No pain in stomach. Pt comes from home.

## 2017-11-05 NOTE — H&P (Signed)
History and Physical    Chase Moss Belle WUJ:[id number] DOB: 05/19/1966 DOA: 11/05/2017  PCP: Jackie Plumsei-Bonsu, George, MD  Patient coming from: Home.  Chief Complaint: Suicidal and drug overdose.  HPI: Chase Moss Nees is a 51 y.o. male with history of polysubstance abuse depression hypertension hyperlipidemia presents to the ER after patient states he has wanting detox from alcohol but also claimed that he is depressed and suicidal and had taken alcohol along with mouthwash, rubbing alcohol and possibly antifreeze and Remeron.  Patient denies taking any salicylates or Tylenol.  Denies any chest pain shortness of breath headache visual symptoms or any weakness of the extremities.  Does not see exact quantities of what and how much he took.  ED Course: In the ER patient remained stable hemodynamically.  EKG shows normal sinus rhythm with QTC of 446 ms and QRS of 102 ms.  Patient was afebrile alert awake.  Depressed and suicidal.  Initial labs showed negative acetaminophen level salicylate levels positive for alcohol level at around 190.  Poison control was contacted and they recommended Fomepizole, pyridoxine and further observation.  Review of Systems: As per HPI, rest all negative.   Past Medical History:  Diagnosis Date  . Bipolar 1 disorder (HCC)   . Depression   . Hypertension   . PTSD (post-traumatic stress disorder)     History reviewed. No pertinent surgical history.   reports that he has been smoking cigarettes.  He has a 30.00 pack-year smoking history. he has never used smokeless tobacco. He reports that he drinks about 1.2 oz of alcohol per week. He reports that he uses drugs. Drugs: Cocaine and Marijuana.  No Known Allergies  Family History  Problem Relation Age of Onset  . Heart attack Other     Prior to Admission medications   Medication Sig Start Date End Date Taking? Authorizing Provider  ARIPiprazole (ABILIFY) 20 MG tablet Take 1 tablet (20 mg total) by mouth daily. For  mood control Patient not taking: Reported on 10/22/2017 05/23/17   Armandina StammerNwoko, Agnes I, NP  atenolol (TENORMIN) 50 MG tablet Take 1 tablet (50 mg total) by mouth daily. For high blood pressure Patient not taking: Reported on 10/22/2017 05/23/17   Armandina StammerNwoko, Agnes I, NP  buPROPion (WELLBUTRIN XL) 300 MG 24 hr tablet Take 1 tablet (300 mg total) by mouth daily. For depression Patient not taking: Reported on 10/22/2017 05/23/17   Armandina StammerNwoko, Agnes I, NP  hydrOXYzine (ATARAX/VISTARIL) 25 MG tablet Take 1 tablet (25 mg total) by mouth every 4 (four) hours as needed for anxiety (Sleep). Patient not taking: Reported on 10/22/2017 05/22/17   Armandina StammerNwoko, Agnes I, NP  lisinopril (PRINIVIL,ZESTRIL) 5 MG tablet Take 1 tablet (5 mg total) by mouth daily. For high blood pressure Patient not taking: Reported on 10/22/2017 05/23/17   Armandina StammerNwoko, Agnes I, NP  mirtazapine (REMERON) 7.5 MG tablet Take 1 tablet (7.5 mg total) by mouth at bedtime. For sleep Patient not taking: Reported on 10/22/2017 05/22/17   Armandina StammerNwoko, Agnes I, NP  pantoprazole (PROTONIX) 40 MG tablet Take 1 tablet (40 mg total) by mouth daily. For acid reflux Patient not taking: Reported on 10/22/2017 05/23/17   Armandina StammerNwoko, Agnes I, NP  pravastatin (PRAVACHOL) 40 MG tablet Take 1 tablet (40 mg total) by mouth every evening. For high cholesterol Patient not taking: Reported on 10/22/2017 05/22/17   Armandina StammerNwoko, Agnes I, NP  prazosin (MINIPRESS) 5 MG capsule Take 1 capsule (5 mg total) by mouth at bedtime. For nightmares Patient not taking: Reported on  10/22/2017 05/22/17   Armandina Stammer I, NP    Physical Exam: Vitals:   11/05/17 0129 11/05/17 0133 11/05/17 0447  BP:  130/81 (!) 155/110  Pulse:  70 77  Resp:  19 19  Temp:  97.8 F (36.6 C)   TempSrc:  Oral   SpO2: 100% 91% 97%      Constitutional: Moderately built and nourished. Vitals:   11/05/17 0129 11/05/17 0133 11/05/17 0447  BP:  130/81 (!) 155/110  Pulse:  70 77  Resp:  19 19  Temp:  97.8 F (36.6 C)   TempSrc:  Oral     SpO2: 100% 91% 97%   Eyes: Anicteric no pallor. ENMT: No discharge from the ears eyes nose or mouth. Neck: No mass felt.  No neck rigidity. Respiratory: No rhonchi or crepitations. Cardiovascular: S1-S2 heard no murmurs appreciated. Abdomen: Soft nontender bowel sounds present. Musculoskeletal: No edema.  No joint effusion. Skin: No rash.  Skin appears warm. Neurologic: Alert awake oriented to time place and person.  Moves all extremities 5 x 5. Psychiatric: Depressed and suicidal.   Labs on Admission: I have personally reviewed following labs and imaging studies  CBC: Recent Labs  Lab 11/05/17 0240  WBC 7.9  HGB 17.4*  HCT 51.0  MCV 92.2  PLT 213   Basic Metabolic Panel: Recent Labs  Lab 11/05/17 0240  NA 141  K 3.5  CL 106  CO2 23  GLUCOSE 94  BUN 13  CREATININE 1.00  CALCIUM 8.8*   GFR: CrCl cannot be calculated (Unknown ideal weight.). Liver Function Tests: Recent Labs  Lab 11/05/17 0240  AST 24  ALT 23  ALKPHOS 71  BILITOT 0.8  PROT 7.5  ALBUMIN 4.2   No results for input(s): LIPASE, AMYLASE in the last 168 hours. No results for input(s): AMMONIA in the last 168 hours. Coagulation Profile: No results for input(s): INR, PROTIME in the last 168 hours. Cardiac Enzymes: No results for input(s): CKTOTAL, CKMB, CKMBINDEX, TROPONINI in the last 168 hours. BNP (last 3 results) No results for input(s): PROBNP in the last 8760 hours. HbA1C: No results for input(s): HGBA1C in the last 72 hours. CBG: No results for input(s): GLUCAP in the last 168 hours. Lipid Profile: No results for input(s): CHOL, HDL, LDLCALC, TRIG, CHOLHDL, LDLDIRECT in the last 72 hours. Thyroid Function Tests: No results for input(s): TSH, T4TOTAL, FREET4, T3FREE, THYROIDAB in the last 72 hours. Anemia Panel: No results for input(s): VITAMINB12, FOLATE, FERRITIN, TIBC, IRON, RETICCTPCT in the last 72 hours. Urine analysis:    Component Value Date/Time   COLORURINE STRAW (A)  11/05/2017 0230   APPEARANCEUR CLEAR 11/05/2017 0230   LABSPEC 1.006 11/05/2017 0230   PHURINE 5.0 11/05/2017 0230   GLUCOSEU NEGATIVE 11/05/2017 0230   HGBUR SMALL (A) 11/05/2017 0230   BILIRUBINUR NEGATIVE 11/05/2017 0230   KETONESUR NEGATIVE 11/05/2017 0230   PROTEINUR NEGATIVE 11/05/2017 0230   UROBILINOGEN 0.2 05/14/2012 0139   NITRITE NEGATIVE 11/05/2017 0230   LEUKOCYTESUR NEGATIVE 11/05/2017 0230   Sepsis Labs: @LABRCNTIP (procalcitonin:4,lacticidven:4) )No results found for this or any previous visit (from the past 240 hour(s)).   Radiological Exams on Admission: No results found.  EKG: Independently reviewed.  Normal sinus rhythm with QTC of 446 ms QRS of 102 ms.  Assessment/Plan Principal Problem:   Drug overdose Active Problems:   Suicidal thoughts   Alcohol intoxication (HCC)   Polysubstance abuse (HCC)    1. Intentional drug overdose with suicidal ideation and depression -patient claiming to  have taken rubbing alcohol mouthwash antifreeze and Remeron -as per poison control patient to be on Fomepizole until alcohol levels come back.  Ethylene glycol, isopropyl alcohol and methanol levels have been sent.  I have discussed with pharmacy to dose Fomepizole.  Poison control has also recommended to closely observe for any respiratory depression from Remeron.  Closely follow metabolic panel for any renal failure from insulin like home and also metabolic acidosis.  Isopropyl alcohol can cause GI bleed. 2. History of alcohol abuse and polysubstance abuse on CIWA protocol.  Will need counseling. 3. History of hypertension has not been taking his medication for many months for now we will keep patient on as needed IV hydralazine. 4. History of hyperlipidemia.   DVT prophylaxis: SCDs. Code Status: Full code. Family Communication: Discussed with patient. Disposition Plan: To be determined. Consults called: None. Admission status: Observation.   Eduard ClosKAKRAKANDY,Sita Mangen N.  MD [facility] Pager [phone].  If 7PM-7AM, please contact night-coverage [email or web address] Password [email or web address]  11/05/2017, 5:05 AM

## 2017-11-06 DIAGNOSIS — T496X2A Poisoning by otorhinolaryngological drugs and preparations, intentional self-harm, initial encounter: Secondary | ICD-10-CM

## 2017-11-06 DIAGNOSIS — G4710 Hypersomnia, unspecified: Secondary | ICD-10-CM | POA: Diagnosis present

## 2017-11-06 DIAGNOSIS — F1910 Other psychoactive substance abuse, uncomplicated: Secondary | ICD-10-CM

## 2017-11-06 DIAGNOSIS — I10 Essential (primary) hypertension: Secondary | ICD-10-CM | POA: Diagnosis present

## 2017-11-06 DIAGNOSIS — T1491XA Suicide attempt, initial encounter: Secondary | ICD-10-CM | POA: Diagnosis not present

## 2017-11-06 DIAGNOSIS — E785 Hyperlipidemia, unspecified: Secondary | ICD-10-CM | POA: Diagnosis present

## 2017-11-06 DIAGNOSIS — T50902D Poisoning by unspecified drugs, medicaments and biological substances, intentional self-harm, subsequent encounter: Secondary | ICD-10-CM | POA: Diagnosis not present

## 2017-11-06 DIAGNOSIS — F10229 Alcohol dependence with intoxication, unspecified: Secondary | ICD-10-CM | POA: Diagnosis present

## 2017-11-06 DIAGNOSIS — R45850 Homicidal ideations: Secondary | ICD-10-CM

## 2017-11-06 DIAGNOSIS — F17210 Nicotine dependence, cigarettes, uncomplicated: Secondary | ICD-10-CM

## 2017-11-06 DIAGNOSIS — R61 Generalized hyperhidrosis: Secondary | ICD-10-CM

## 2017-11-06 DIAGNOSIS — R45851 Suicidal ideations: Secondary | ICD-10-CM | POA: Diagnosis present

## 2017-11-06 DIAGNOSIS — F332 Major depressive disorder, recurrent severe without psychotic features: Secondary | ICD-10-CM

## 2017-11-06 DIAGNOSIS — R109 Unspecified abdominal pain: Secondary | ICD-10-CM | POA: Diagnosis not present

## 2017-11-06 DIAGNOSIS — F10929 Alcohol use, unspecified with intoxication, unspecified: Secondary | ICD-10-CM | POA: Diagnosis not present

## 2017-11-06 DIAGNOSIS — F419 Anxiety disorder, unspecified: Secondary | ICD-10-CM | POA: Diagnosis not present

## 2017-11-06 DIAGNOSIS — R450 Nervousness: Secondary | ICD-10-CM

## 2017-11-06 DIAGNOSIS — R4182 Altered mental status, unspecified: Secondary | ICD-10-CM | POA: Diagnosis present

## 2017-11-06 DIAGNOSIS — T512X2A Toxic effect of 2-Propanol, intentional self-harm, initial encounter: Secondary | ICD-10-CM

## 2017-11-06 DIAGNOSIS — F4310 Post-traumatic stress disorder, unspecified: Secondary | ICD-10-CM | POA: Diagnosis present

## 2017-11-06 DIAGNOSIS — F1210 Cannabis abuse, uncomplicated: Secondary | ICD-10-CM

## 2017-11-06 DIAGNOSIS — T43022A Poisoning by tetracyclic antidepressants, intentional self-harm, initial encounter: Principal | ICD-10-CM

## 2017-11-06 DIAGNOSIS — F1410 Cocaine abuse, uncomplicated: Secondary | ICD-10-CM | POA: Diagnosis present

## 2017-11-06 DIAGNOSIS — T6592XA Toxic effect of unspecified substance, intentional self-harm, initial encounter: Secondary | ICD-10-CM | POA: Diagnosis present

## 2017-11-06 DIAGNOSIS — Z8249 Family history of ischemic heart disease and other diseases of the circulatory system: Secondary | ICD-10-CM | POA: Diagnosis not present

## 2017-11-06 DIAGNOSIS — Z79899 Other long term (current) drug therapy: Secondary | ICD-10-CM | POA: Diagnosis not present

## 2017-11-06 DIAGNOSIS — T510X2A Toxic effect of ethanol, intentional self-harm, initial encounter: Secondary | ICD-10-CM | POA: Diagnosis present

## 2017-11-06 LAB — HIV ANTIBODY (ROUTINE TESTING W REFLEX): HIV SCREEN 4TH GENERATION: NONREACTIVE

## 2017-11-06 MED ORDER — PRAZOSIN HCL 1 MG PO CAPS
1.0000 mg | ORAL_CAPSULE | Freq: Every day | ORAL | Status: DC
  Administered 2017-11-06 – 2017-11-07 (×2): 1 mg via ORAL
  Filled 2017-11-06 (×3): qty 1

## 2017-11-06 MED ORDER — SODIUM CHLORIDE 0.9 % IV SOLN
INTRAVENOUS | Status: AC
  Administered 2017-11-06 (×2): via INTRAVENOUS

## 2017-11-06 MED ORDER — HYDROXYZINE HCL 25 MG PO TABS
25.0000 mg | ORAL_TABLET | ORAL | Status: DC | PRN
  Administered 2017-11-07 – 2017-11-08 (×2): 25 mg via ORAL
  Filled 2017-11-06 (×2): qty 1

## 2017-11-06 MED ORDER — ARIPIPRAZOLE 5 MG PO TABS
5.0000 mg | ORAL_TABLET | Freq: Every day | ORAL | Status: DC
  Administered 2017-11-06 – 2017-11-08 (×3): 5 mg via ORAL
  Filled 2017-11-06 (×3): qty 1

## 2017-11-06 MED ORDER — PANTOPRAZOLE SODIUM 40 MG PO TBEC
40.0000 mg | DELAYED_RELEASE_TABLET | Freq: Every day | ORAL | Status: DC
  Administered 2017-11-06 – 2017-11-08 (×3): 40 mg via ORAL
  Filled 2017-11-06 (×3): qty 1

## 2017-11-06 MED ORDER — BUPROPION HCL ER (XL) 150 MG PO TB24
150.0000 mg | ORAL_TABLET | Freq: Every day | ORAL | Status: DC
  Administered 2017-11-06 – 2017-11-08 (×3): 150 mg via ORAL
  Filled 2017-11-06 (×3): qty 1

## 2017-11-06 MED ORDER — PRAVASTATIN SODIUM 40 MG PO TABS
40.0000 mg | ORAL_TABLET | Freq: Every evening | ORAL | Status: DC
  Administered 2017-11-06 – 2017-11-08 (×3): 40 mg via ORAL
  Filled 2017-11-06 (×3): qty 1

## 2017-11-06 NOTE — Progress Notes (Signed)
[place], [place]. Care assumed for pt at 1515. Pt sleeping at present, resp even, unlabored. Sitter at bedside. Assessment unchanged from AM assessement. Will continue to monitor.
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
## 2017-11-06 NOTE — Progress Notes (Signed)
PROGRESS NOTE    Chase DowGregory [facility]  ZOX:[id number] DOB: 01/09/1966 DOA: 11/05/2017 PCP: Jackie Plumsei-Bonsu, George, MD    Brief Narrative:  Chase Moss is a 51 y.o. male with history of polysubstance abuse depression hypertension hyperlipidemia presents to the ER after patient states he has wanting detox from alcohol but also claimed that he is depressed and suicidal and had taken alcohol along with mouthwash, rubbing alcohol and possibly antifreeze and Remeron.  Patient denies taking any salicylates or Tylenol.  Denies any chest pain shortness of breath headache visual symptoms or any weakness of the extremities.  Does not see exact quantities of what and how much he took.  ED Course: In the ER patient remained stable hemodynamically.  EKG shows normal sinus rhythm with QTC of 446 ms and QRS of 102 ms.  Patient was afebrile alert awake.  Depressed and suicidal.  Initial labs showed negative acetaminophen level salicylate levels positive for alcohol level at around 190.  Poison control was contacted and they recommended Fomepizole, pyridoxine and further observation     Assessment & Plan:   Principal Problem:   Drug overdose Active Problems:   Suicidal thoughts   Alcohol intoxication (HCC)   Polysubstance abuse (HCC)  #1 intentional drug overdose with suicidal ideation and depression Patient stated he drank a whole bottle of alcohol mouthwash as well as alcohol.  The patient stated he thought about drinking antifreeze however did not.  Patient endorses suicidal ideation.  UDS positive for cocaine and THC.  Salicylate level less than 7.  Acetone level negative.  Isopropanol negative, methanol negative, ethylene glycol negative.  Alcohol level is 190 on admission.  Patient currently afebrile.  Continue IV fluids, supportive care. Corporate investment bankerContinue sitter.  Psychiatric consultation pending.  2.  Alcohol abuse/polysubstance abuse  Patient wanting alcohol detox.  Patient was some diaphoresis overnight.   Continue Ativan withdrawal protocol.  Continue thiamine, folic acid.  Follow.  3.  Hypertension Patient not taking any of his antihypertensive medications at home.  Patient has been started on Norvasc 5 mg daily.  Follow.  4.  Hyperlipidemia Check a fasting lipid panel.  Continue Pravachol.   DVT prophylaxis: SCDs Code Status: Full Family Communication: Updated patient.  No family at bedside. Disposition Plan: Pending psychiatric evaluation.  Likely inpatient psychiatry as patient still endorsing suicidal ideation.   Consultants:   Psychiatry  Procedures:   None  Antimicrobials:   None   Subjective: Patient in bed.  Patient endorsing suicidal ideation.  Patient denies any chest pain or shortness of breath.  Patient states just does not want to live.  Patient was some diaphoresis overnight.  Patient states he drank a whole bottle of mouthwash.  Patient states was thinking of drinking antifreeze however did not.  Objective: Vitals:   11/05/17 1319 11/05/17 2008 11/06/17 0657 11/06/17 0715  BP: 129/79 (!) 151/101 (!) 170/102 (!) 145/84  Pulse: 77 77 60   Resp: 16 20 17    Temp: 98.4 F (36.9 C) 97.8 F (36.6 C) (!) 97.4 F (36.3 C)   TempSrc: Oral Oral Oral   SpO2: 98% 97% 97%   Weight:      Height:        Intake/Output Summary (Last 24 hours) at 11/06/2017 1101 Last data filed at 11/06/2017 0444 Gross per 24 hour  Intake 3439.59 ml  Output -  Net 3439.59 ml   Filed Weights   11/05/17 0923  Weight: 77.6 kg (171 lb 1.2 oz)    Examination:  General exam: Appears  calm and comfortable  Respiratory system: Clear to auscultation. Respiratory effort normal. Cardiovascular system: S1 & S2 heard, RRR. No JVD, murmurs, rubs, gallops or clicks. No pedal edema. Gastrointestinal system: Abdomen is nondistended, soft and nontender. No organomegaly or masses felt. Normal bowel sounds heard. Central nervous system: Alert and oriented. No focal neurological  deficits. Extremities: Symmetric 5 x 5 power. Skin: No rashes, lesions or ulcers Psychiatry: Judgement and insight appear normal. Mood & affect appropriate.     Data Reviewed: I have personally reviewed following labs and imaging studies  CBC: Recent Labs  Lab 11/05/17 0240 11/05/17 0849  WBC 7.9 6.3  NEUTROABS  --  3.1  HGB 17.4* 16.3  HCT 51.0 48.2  MCV 92.2 92.0  PLT 213 209   Basic Metabolic Panel: Recent Labs  Lab 11/05/17 0240 11/05/17 0849  NA 141 141  K 3.5 3.9  CL 106 108  CO2 23 24  GLUCOSE 94 85  BUN 13 14  CREATININE 1.00 0.95  CALCIUM 8.8* 8.5*   GFR: Estimated Creatinine Clearance: 92.9 mL/min (by C-G formula based on SCr of 0.95 mg/dL). Liver Function Tests: Recent Labs  Lab 11/05/17 0240 11/05/17 0849  AST 24 25  ALT 23 22  ALKPHOS 71 67  BILITOT 0.8 0.4  PROT 7.5 7.0  ALBUMIN 4.2 4.0   No results for input(s): LIPASE, AMYLASE in the last 168 hours. No results for input(s): AMMONIA in the last 168 hours. Coagulation Profile: No results for input(s): INR, PROTIME in the last 168 hours. Cardiac Enzymes: No results for input(s): CKTOTAL, CKMB, CKMBINDEX, TROPONINI in the last 168 hours. BNP (last 3 results) No results for input(s): PROBNP in the last 8760 hours. HbA1C: No results for input(s): HGBA1C in the last 72 hours. CBG: No results for input(s): GLUCAP in the last 168 hours. Lipid Profile: No results for input(s): CHOL, HDL, LDLCALC, TRIG, CHOLHDL, LDLDIRECT in the last 72 hours. Thyroid Function Tests: Recent Labs    11/05/17 0849  TSH 0.990   Anemia Panel: No results for input(s): VITAMINB12, FOLATE, FERRITIN, TIBC, IRON, RETICCTPCT in the last 72 hours. Sepsis Labs: No results for input(s): PROCALCITON, LATICACIDVEN in the last 168 hours.  No results found for this or any previous visit (from the past 240 hour(s)).       Radiology Studies: No results found.      Scheduled Meds: . amLODipine  5 mg Oral Daily   . folic acid  1 mg Oral Daily  . LORazepam  0-4 mg Oral Q6H   Followed by  . [START ON 11/07/2017] LORazepam  0-4 mg Oral Q12H  . multivitamin with minerals  1 tablet Oral Daily  . pantoprazole  40 mg Oral Daily  . pravastatin  40 mg Oral QPM  . thiamine  100 mg Oral Daily   Or  . thiamine  100 mg Intravenous Daily   Continuous Infusions:   LOS: 0 days    Time spent: 35 minutes    Loretta Kluender, MD [facility] Pager [phone]  If 7PM-7AM, please contact night-coverage [email or web address] Password [email or web address] 11/06/2017, 11:01 AM

## 2017-11-07 DIAGNOSIS — R45851 Suicidal ideations: Secondary | ICD-10-CM

## 2017-11-07 DIAGNOSIS — F10929 Alcohol use, unspecified with intoxication, unspecified: Secondary | ICD-10-CM

## 2017-11-07 LAB — CBC WITH DIFFERENTIAL/PLATELET
BASOS PCT: 0 %
Basophils Absolute: 0 10*3/uL (ref 0.0–0.1)
EOS ABS: 0.1 10*3/uL (ref 0.0–0.7)
Eosinophils Relative: 2 %
HEMATOCRIT: 44.1 % (ref 39.0–52.0)
HEMOGLOBIN: 14.7 g/dL (ref 13.0–17.0)
Lymphocytes Relative: 36 %
Lymphs Abs: 2.5 10*3/uL (ref 0.7–4.0)
MCH: 30.7 pg (ref 26.0–34.0)
MCHC: 33.3 g/dL (ref 30.0–36.0)
MCV: 92.1 fL (ref 78.0–100.0)
MONOS PCT: 7 %
Monocytes Absolute: 0.5 10*3/uL (ref 0.1–1.0)
NEUTROS ABS: 3.8 10*3/uL (ref 1.7–7.7)
NEUTROS PCT: 55 %
Platelets: 182 10*3/uL (ref 150–400)
RBC: 4.79 MIL/uL (ref 4.22–5.81)
RDW: 13.2 % (ref 11.5–15.5)
WBC: 6.9 10*3/uL (ref 4.0–10.5)

## 2017-11-07 LAB — BASIC METABOLIC PANEL
ANION GAP: 7 (ref 5–15)
BUN: 13 mg/dL (ref 6–20)
CHLORIDE: 107 mmol/L (ref 101–111)
CO2: 23 mmol/L (ref 22–32)
Calcium: 8.6 mg/dL — ABNORMAL LOW (ref 8.9–10.3)
Creatinine, Ser: 0.99 mg/dL (ref 0.61–1.24)
GFR calc non Af Amer: >60 mL/min (ref >60)
Glucose, Bld: 99 mg/dL (ref 65–99)
POTASSIUM: 3.4 mmol/L — AB (ref 3.5–5.1)
SODIUM: 137 mmol/L (ref 135–145)

## 2017-11-07 LAB — LIPID PANEL
CHOL/HDL RATIO: 3.8 ratio
CHOLESTEROL: 152 mg/dL (ref 0–200)
HDL: 40 mg/dL — ABNORMAL LOW (ref >40)
LDL Cholesterol: 84 mg/dL (ref 0–99)
Triglycerides: 142 mg/dL (ref <150)
VLDL: 28 mg/dL (ref 0–40)

## 2017-11-07 MED ORDER — ATENOLOL 50 MG PO TABS
50.0000 mg | ORAL_TABLET | Freq: Every day | ORAL | Status: DC
  Administered 2017-11-07 – 2017-11-08 (×2): 50 mg via ORAL
  Filled 2017-11-07 (×2): qty 1

## 2017-11-07 MED ORDER — POLYETHYLENE GLYCOL 3350 17 G PO PACK
17.0000 g | PACK | Freq: Two times a day (BID) | ORAL | Status: DC
  Administered 2017-11-07 – 2017-11-08 (×3): 17 g via ORAL
  Filled 2017-11-07 (×3): qty 1

## 2017-11-07 MED ORDER — POTASSIUM CHLORIDE CRYS ER 20 MEQ PO TBCR
40.0000 meq | EXTENDED_RELEASE_TABLET | Freq: Once | ORAL | Status: AC
  Administered 2017-11-07: 40 meq via ORAL
  Filled 2017-11-07: qty 2

## 2017-11-07 MED ORDER — ACETAMINOPHEN 325 MG PO TABS: 650.0000 mg | ORAL_TABLET | ORAL | Status: DC | PRN

## 2017-11-07 MED ORDER — SORBITOL 70 % SOLN
30.0000 mL | Freq: Once | Status: AC
  Administered 2017-11-07: 30 mL via ORAL
  Filled 2017-11-07: qty 30

## 2017-11-07 MED ORDER — AMLODIPINE BESYLATE 10 MG PO TABS
10.0000 mg | ORAL_TABLET | Freq: Every day | ORAL | Status: DC
  Administered 2017-11-07 – 2017-11-08 (×2): 10 mg via ORAL
  Filled 2017-11-07 (×2): qty 1

## 2017-11-07 MED ORDER — SODIUM CHLORIDE 0.9 % IV SOLN
INTRAVENOUS | Status: AC
  Administered 2017-11-07: 21:00:00 via INTRAVENOUS

## 2017-11-07 MED ORDER — SENNOSIDES-DOCUSATE SODIUM 8.6-50 MG PO TABS
1.0000 | ORAL_TABLET | Freq: Two times a day (BID) | ORAL | Status: DC
  Administered 2017-11-07 – 2017-11-08 (×3): 1 via ORAL
  Filled 2017-11-07 (×3): qty 1

## 2017-11-07 NOTE — Clinical Social Work Note (Signed)
Clinical Social Work Assessment  Patient Details  Name: Chase Moss MRN: [id number] Date of Birth: Oct 23, 1966  Date of referral:  11/07/17               Reason for consult:  Discharge Planning                Permission sought to share information with:  Case Manager, Facility Sport and exercise psychologist, Psychiatrist Permission granted to share information::     Name::        Agency::     Relationship::     Contact Information:     Housing/Transportation Living arrangements for the past 2 months:  Single Family Home Source of Information:  Patient Patient Interpreter Needed:  None Criminal Activity/Legal Involvement Pertinent to Current Situation/Hospitalization:  No - Comment as needed Significant Relationships:  Siblings Lives with:  Self Do you feel safe going back to the place where you live?  Yes Need for family participation in patient care:  Yes (Comment)  Care giving concerns:  No care giving concerns at the time of assessment.    Social Worker assessment / plan:  LCSW following for inpatient psych placement.   Patient was admitted to hospital for intentional overdose. Patient came to the ED and claimed that he is depressed and suicidal and had taken alcohol along with mouthwash, rubbing alcohol and possibly antifreeze and Remeron.  LCSW met with patient bedside. Patient has sitter. Patient was covered up by blanket when LCSW entered the room.  Patient was willing to participate in assessment but provided limited information. Patient appeared to be irritable and frustrated.    Patient reports that he is unsure if he has his disability benefit or insurance. He stated that he is unsure because no one every formally notified him and he got the information second hand.   Patient inquired about how he would find out. LCSW suggested that he contact his case worker at [facility] to get more information.  Patient reports that he is willing to go inpatient voluntary. He stated that he  really needs his meds.   Patient was unable to tell who previously prescribed his meds and could not recall being followed by an outpatient psychiatrist.   PLAN: Patient will go inpatient psych at [facility] pending bed availibility.     Employment status:  Disabled (Comment on whether or not currently receiving Disability) Insurance information:  Medicaid In [place] PT Recommendations:  No Follow Up Information / Referral to community resources:     Patient/Family's Response to care:  Patient seems responsive to care. Patient appeared anxious to get his meds.   Patient/Family's Understanding of and Emotional Response to Diagnosis, Current Treatment, and Prognosis:   Patient verbalized a clear understanding of his current diagnosis and is agreeable to treatment plan.   Emotional Assessment Appearance:  Appears stated age Attitude/Demeanor/Rapport:    Affect (typically observed):  Irritable, Frustrated Orientation:  Oriented to Self, Oriented to Place, Oriented to  Time, Oriented to Situation Alcohol / Substance use:  Not Applicable Psych involvement (Current and /or in the community):     Discharge Needs  Concerns to be addressed:  No discharge needs identified Readmission within the last 30 days:  Yes Current discharge risk:  None Barriers to Discharge:  No Barriers Identified   Servando Snare, LCSW 11/07/2017, 10:54 AM

## 2017-11-07 NOTE — Progress Notes (Addendum)
LCSW following for inpatient psych placement.  Notified [place] and [place] awaiting notification on bed availibility.   LCSW met with patient at bedside.   Patient is agreeable to voluntarily go inpatient psych once medically clear.   Patient  medically clear.   Chase Moss

## 2017-11-07 NOTE — Progress Notes (Signed)
PROGRESS NOTE    Chase DowGregory Moss  NFA:[id number] DOB: 01/15/1966 DOA: 11/05/2017 PCP: Jackie Plumsei-Bonsu, George, MD    Brief Narrative:  Chase Moss is a 51 y.o. male with history of polysubstance abuse depression hypertension hyperlipidemia presents to the ER after patient states he has wanting detox from alcohol but also claimed that he is depressed and suicidal and had taken alcohol along with mouthwash, rubbing alcohol and possibly antifreeze and Remeron.  Patient denies taking any salicylates or Tylenol.  Denies any chest pain shortness of breath headache visual symptoms or any weakness of the extremities.  Does not see exact quantities of what and how much he took.  ED Course: In the ER patient remained stable hemodynamically.  EKG shows normal sinus rhythm with QTC of 446 ms and QRS of 102 ms.  Patient was afebrile alert awake.  Depressed and suicidal.  Initial labs showed negative acetaminophen level salicylate levels positive for alcohol level at around 190.  Poison control was contacted and they recommended Fomepizole, pyridoxine and further observation     Assessment & Plan:   Principal Problem:   MDD (major depressive disorder), recurrent severe, without psychosis (HCC) Active Problems:   Suicidal thoughts   Alcohol intoxication (HCC)   Polysubstance abuse (HCC)   Drug overdose  #1 intentional drug overdose with suicidal ideation and depression Patient stated he drank a whole bottle of alcohol mouthwash as well as alcohol.  The patient stated he thought about drinking antifreeze however did not.  Patient endorses suicidal ideation.  UDS positive for cocaine and THC.  Salicylate level less than 7.  Acetone level negative.  Isopropanol negative, methanol negative, ethylene glycol negative.  Alcohol level is 190 on admission.  Patient currently afebrile.  Continue IV fluids, supportive care. Corporate investment bankerContinue sitter. Patient has been seen by psychiatry and recommend inpatient psychiatry  admission. Patient currently medically stable for inpatient psychiatry admission.   2.  Alcohol abuse/polysubstance abuse  Patient wanting alcohol detox.  Patient with clinical improvement. Continue Ativan withdrawal protocol, thiamine, folic acid.  Follow.  3.  Hypertension Patient not taking any of his antihypertensive medications at home.  Increase norvasc to10mg  daily.  4.  Hyperlipidemia Fasting lipid panel with an LDL of 84.  Continue Pravachol.   DVT prophylaxis: SCDs Code Status: Full Family Communication: Updated patient.  No family at bedside. Disposition Plan: Inpatient psychiatry when bed available.  Patient currently medically stable.    Consultants:   Psychiatry: Dr. Sharma CovertNorman 11/06/2017  Procedures:   None  Antimicrobials:   None   Subjective: Patient in bed.  Patient denies any chest pain, no shortness of breath.  Patient still with endorsement of suicidal ideations.    Objective: Vitals:   11/06/17 2325 11/07/17 0537 11/07/17 0931 11/07/17 1200  BP: (!) 138/92 (!) 137/92 134/86 130/77  Pulse: 87 68 68 (!) 55  Resp: 20 18    Temp: 98.2 F (36.8 C) 98.1 F (36.7 C)  98.1 F (36.7 C)  TempSrc: Oral Oral  Oral  SpO2: 98% 98%  99%  Weight:      Height:        Intake/Output Summary (Last 24 hours) at 11/07/2017 1303 Last data filed at 11/07/2017 1127 Gross per 24 hour  Intake 2185.42 ml  Output 0 ml  Net 2185.42 ml   Filed Weights   11/05/17 0923  Weight: 77.6 kg (171 lb 1.2 oz)    Examination:  General exam: NAD Respiratory system: Clear to auscultation.  No wheezes, no crackles, no  rhonchi.  Respiratory effort normal. Cardiovascular system: S1 & S2 heard, RRR. No JVD, murmurs, rubs, gallops or clicks. No pedal edema. Gastrointestinal system: Abdomen is soft, nontender, nondistended, positive bowel sounds.   Central nervous system: Alert and oriented. No focal neurological deficits. Extremities: Symmetric 5 x 5 power. Skin: No rashes,  lesions or ulcers Psychiatry: Judgement and insight appear normal. Mood & affect appropriate.     Data Reviewed: I have personally reviewed following labs and imaging studies  CBC: Recent Labs  Lab 11/05/17 0240 11/05/17 0849 11/07/17 0419  WBC 7.9 6.3 6.9  NEUTROABS  --  3.1 3.8  HGB 17.4* 16.3 14.7  HCT 51.0 48.2 44.1  MCV 92.2 92.0 92.1  PLT 213 209 182   Basic Metabolic Panel: Recent Labs  Lab 11/05/17 0240 11/05/17 0849 11/07/17 0419  NA 141 141 137  K 3.5 3.9 3.4*  CL 106 108 107  CO2 23 24 23   GLUCOSE 94 85 99  BUN 13 14 13   CREATININE 1.00 0.95 0.99  CALCIUM 8.8* 8.5* 8.6*   GFR: Estimated Creatinine Clearance: 89.1 mL/min (by C-G formula based on SCr of 0.99 mg/dL). Liver Function Tests: Recent Labs  Lab 11/05/17 0240 11/05/17 0849  AST 24 25  ALT 23 22  ALKPHOS 71 67  BILITOT 0.8 0.4  PROT 7.5 7.0  ALBUMIN 4.2 4.0   No results for input(s): LIPASE, AMYLASE in the last 168 hours. No results for input(s): AMMONIA in the last 168 hours. Coagulation Profile: No results for input(s): INR, PROTIME in the last 168 hours. Cardiac Enzymes: No results for input(s): CKTOTAL, CKMB, CKMBINDEX, TROPONINI in the last 168 hours. BNP (last 3 results) No results for input(s): PROBNP in the last 8760 hours. HbA1C: No results for input(s): HGBA1C in the last 72 hours. CBG: No results for input(s): GLUCAP in the last 168 hours. Lipid Profile: Recent Labs    11/07/17 0419  CHOL 152  HDL 40*  LDLCALC 84  TRIG [id number]  CHOLHDL 3.8   Thyroid Function Tests: Recent Labs    11/05/17 0849  TSH 0.990   Anemia Panel: No results for input(s): VITAMINB12, FOLATE, FERRITIN, TIBC, IRON, RETICCTPCT in the last 72 hours. Sepsis Labs: No results for input(s): PROCALCITON, LATICACIDVEN in the last 168 hours.  No results found for this or any previous visit (from the past 240 hour(s)).       Radiology Studies: No results found.      Scheduled Meds: .  amLODipine  10 mg Oral Daily  . ARIPiprazole  5 mg Oral Daily  . atenolol  50 mg Oral Daily  . buPROPion  150 mg Oral Daily  . folic acid  1 mg Oral Daily  . LORazepam  0-4 mg Oral Q12H  . multivitamin with minerals  1 tablet Oral Daily  . pantoprazole  40 mg Oral Daily  . polyethylene glycol  17 g Oral BID  . pravastatin  40 mg Oral QPM  . prazosin  1 mg Oral QHS  . senna-docusate  1 tablet Oral BID  . sorbitol  30 mL Oral Once  . thiamine  100 mg Oral Daily   Or  . thiamine  100 mg Intravenous Daily   Continuous Infusions:   LOS: 1 day    Time spent: 35 minutes    THOMPSON,DANIEL, MD [facility] Pager [phone]  If 7PM-7AM, please contact night-coverage [email or web address] Password [email or web address] 11/07/2017, 1:03 PM

## 2017-11-08 ENCOUNTER — Inpatient Hospital Stay
Admission: AD | Admit: 2017-11-08 | Discharge: 2017-11-13 | DRG: 885 | Disposition: A | Discharge status: Home / Self Care | Payer: Medicaid Other | Attending: Psychiatry | Admitting: Psychiatry

## 2017-11-08 ENCOUNTER — Encounter: Payer: Self-pay | Admitting: Psychiatry

## 2017-11-08 DIAGNOSIS — F1020 Alcohol dependence, uncomplicated: Secondary | ICD-10-CM | POA: Diagnosis present

## 2017-11-08 DIAGNOSIS — R45851 Suicidal ideations: Secondary | ICD-10-CM

## 2017-11-08 DIAGNOSIS — I10 Essential (primary) hypertension: Secondary | ICD-10-CM | POA: Diagnosis present

## 2017-11-08 DIAGNOSIS — F17210 Nicotine dependence, cigarettes, uncomplicated: Secondary | ICD-10-CM | POA: Diagnosis present

## 2017-11-08 DIAGNOSIS — Z915 Personal history of self-harm: Secondary | ICD-10-CM

## 2017-11-08 DIAGNOSIS — G4700 Insomnia, unspecified: Secondary | ICD-10-CM | POA: Diagnosis present

## 2017-11-08 DIAGNOSIS — F4310 Post-traumatic stress disorder, unspecified: Secondary | ICD-10-CM | POA: Diagnosis present

## 2017-11-08 DIAGNOSIS — F333 Major depressive disorder, recurrent, severe with psychotic symptoms: Secondary | ICD-10-CM | POA: Diagnosis present

## 2017-11-08 DIAGNOSIS — K5900 Constipation, unspecified: Secondary | ICD-10-CM | POA: Diagnosis present

## 2017-11-08 DIAGNOSIS — Z79899 Other long term (current) drug therapy: Secondary | ICD-10-CM | POA: Diagnosis not present

## 2017-11-08 DIAGNOSIS — E785 Hyperlipidemia, unspecified: Secondary | ICD-10-CM | POA: Diagnosis present

## 2017-11-08 DIAGNOSIS — F1220 Cannabis dependence, uncomplicated: Secondary | ICD-10-CM | POA: Diagnosis present

## 2017-11-08 DIAGNOSIS — F1420 Cocaine dependence, uncomplicated: Secondary | ICD-10-CM | POA: Diagnosis present

## 2017-11-08 DIAGNOSIS — F17200 Nicotine dependence, unspecified, uncomplicated: Secondary | ICD-10-CM | POA: Diagnosis present

## 2017-11-08 DIAGNOSIS — T50902A Poisoning by unspecified drugs, medicaments and biological substances, intentional self-harm, initial encounter: Secondary | ICD-10-CM | POA: Diagnosis present

## 2017-11-08 LAB — BASIC METABOLIC PANEL
Anion gap: 9 (ref 5–15)
BUN: 13 mg/dL (ref 6–20)
CHLORIDE: 104 mmol/L (ref 101–111)
CO2: 24 mmol/L (ref 22–32)
Calcium: 8.7 mg/dL — ABNORMAL LOW (ref 8.9–10.3)
Creatinine, Ser: 1.01 mg/dL (ref 0.61–1.24)
GFR calc Af Amer: >60 mL/min (ref >60)
GFR calc non Af Amer: >60 mL/min (ref >60)
GLUCOSE: 88 mg/dL (ref 65–99)
POTASSIUM: 3.9 mmol/L (ref 3.5–5.1)
Sodium: 137 mmol/L (ref 135–145)

## 2017-11-08 MED ORDER — PRAZOSIN HCL 1 MG PO CAPS
1.0000 mg | ORAL_CAPSULE | Freq: Every day | ORAL | Status: DC
  Administered 2017-11-09 – 2017-11-10 (×3): 1 mg via ORAL
  Filled 2017-11-08 (×3): qty 1

## 2017-11-08 MED ORDER — ALUM & MAG HYDROXIDE-SIMETH 200-200-20 MG/5ML PO SUSP: 30.0000 mL | ORAL | Status: DC | PRN

## 2017-11-08 MED ORDER — PRAVASTATIN SODIUM 40 MG PO TABS
40.0000 mg | ORAL_TABLET | Freq: Every evening | ORAL | Status: DC
  Administered 2017-11-09 – 2017-11-12 (×4): 40 mg via ORAL
  Filled 2017-11-08 (×4): qty 1

## 2017-11-08 MED ORDER — TRAZODONE HCL 100 MG PO TABS
100.0000 mg | ORAL_TABLET | Freq: Every day | ORAL | Status: DC
  Administered 2017-11-08: 100 mg via ORAL
  Filled 2017-11-08: qty 1

## 2017-11-08 MED ORDER — ATENOLOL 50 MG PO TABS
50.0000 mg | ORAL_TABLET | Freq: Every day | ORAL | Status: DC
  Administered 2017-11-10 – 2017-11-13 (×4): 50 mg via ORAL
  Filled 2017-11-08 (×6): qty 1

## 2017-11-08 MED ORDER — HYDRALAZINE HCL 20 MG/ML IJ SOLN
10.0000 mg | INTRAMUSCULAR | Status: DC | PRN
  Filled 2017-11-08: qty 0.5

## 2017-11-08 MED ORDER — HYDROXYZINE HCL 25 MG PO TABS: 25.0000 mg | ORAL_TABLET | ORAL | Status: DC | PRN

## 2017-11-08 MED ORDER — AMLODIPINE BESYLATE 10 MG PO TABS: 10.0000 mg | ORAL_TABLET | Freq: Every day | ORAL | 1 refills | Status: DC

## 2017-11-08 MED ORDER — HYDROXYZINE HCL 25 MG PO TABS: 25.0000 mg | ORAL_TABLET | ORAL | 0 refills | Status: DC | PRN

## 2017-11-08 MED ORDER — PRAVASTATIN SODIUM 40 MG PO TABS: 40.0000 mg | ORAL_TABLET | Freq: Every evening | ORAL | 0 refills | Status: DC

## 2017-11-08 MED ORDER — MAGNESIUM HYDROXIDE 400 MG/5ML PO SUSP: 30.0000 mL | Freq: Every day | ORAL | Status: DC | PRN

## 2017-11-08 MED ORDER — SENNOSIDES-DOCUSATE SODIUM 8.6-50 MG PO TABS: 1.0000 | ORAL_TABLET | Freq: Two times a day (BID) | ORAL | Status: DC

## 2017-11-08 MED ORDER — SORBITOL 70 % SOLN
30.0000 mL | Freq: Once | Status: AC
  Administered 2017-11-08: 30 mL via ORAL
  Filled 2017-11-08: qty 30

## 2017-11-08 MED ORDER — POLYETHYLENE GLYCOL 3350 17 G PO PACK
17.0000 g | PACK | Freq: Two times a day (BID) | ORAL | Status: DC
  Administered 2017-11-09 – 2017-11-10 (×4): 17 g via ORAL
  Filled 2017-11-08 (×6): qty 1

## 2017-11-08 MED ORDER — THIAMINE HCL 100 MG PO TABS: 100.0000 mg | ORAL_TABLET | Freq: Every day | ORAL | Status: DC

## 2017-11-08 MED ORDER — ARIPIPRAZOLE 5 MG PO TABS
5.0000 mg | ORAL_TABLET | Freq: Every day | ORAL | Status: DC
  Administered 2017-11-09 – 2017-11-13 (×5): 5 mg via ORAL
  Filled 2017-11-08 (×5): qty 1

## 2017-11-08 MED ORDER — NICOTINE 21 MG/24HR TD PT24
21.0000 mg | MEDICATED_PATCH | Freq: Every day | TRANSDERMAL | Status: DC
  Filled 2017-11-08 (×4): qty 1

## 2017-11-08 MED ORDER — POLYETHYLENE GLYCOL 3350 17 G PO PACK: 17.0000 g | PACK | Freq: Two times a day (BID) | ORAL | 0 refills | Status: DC

## 2017-11-08 MED ORDER — PRAZOSIN HCL 1 MG PO CAPS: 1.0000 mg | ORAL_CAPSULE | Freq: Every day | ORAL | 0 refills | Status: DC

## 2017-11-08 MED ORDER — BUPROPION HCL ER (XL) 150 MG PO TB24: 150.0000 mg | ORAL_TABLET | Freq: Every day | ORAL | 0 refills | Status: DC

## 2017-11-08 MED ORDER — PANTOPRAZOLE SODIUM 40 MG PO TBEC: 40.0000 mg | DELAYED_RELEASE_TABLET | Freq: Every day | ORAL | 0 refills | Status: DC

## 2017-11-08 MED ORDER — FOLIC ACID 1 MG PO TABS: 1.0000 mg | ORAL_TABLET | Freq: Every day | ORAL | 0 refills | Status: DC

## 2017-11-08 MED ORDER — ATENOLOL 50 MG PO TABS: 50.0000 mg | ORAL_TABLET | Freq: Every day | ORAL | 0 refills | Status: DC

## 2017-11-08 MED ORDER — ACETAMINOPHEN 325 MG PO TABS
650.0000 mg | ORAL_TABLET | Freq: Four times a day (QID) | ORAL | Status: DC | PRN
  Administered 2017-11-11 – 2017-11-12 (×2): 650 mg via ORAL
  Filled 2017-11-08 (×2): qty 2

## 2017-11-08 MED ORDER — BUPROPION HCL ER (XL) 150 MG PO TB24
150.0000 mg | ORAL_TABLET | Freq: Every day | ORAL | Status: DC
  Administered 2017-11-09 – 2017-11-13 (×5): 150 mg via ORAL
  Filled 2017-11-08 (×5): qty 1

## 2017-11-08 MED ORDER — ARIPIPRAZOLE 5 MG PO TABS: 5.0000 mg | ORAL_TABLET | Freq: Every day | ORAL | 0 refills | Status: DC

## 2017-11-08 MED ORDER — HYDROXYZINE HCL 50 MG PO TABS: 50.0000 mg | ORAL_TABLET | Freq: Three times a day (TID) | ORAL | Status: DC | PRN

## 2017-11-08 MED ORDER — AMLODIPINE BESYLATE 5 MG PO TABS
10.0000 mg | ORAL_TABLET | Freq: Every day | ORAL | Status: DC
  Administered 2017-11-09 – 2017-11-10 (×2): 10 mg via ORAL
  Filled 2017-11-08 (×2): qty 2

## 2017-11-08 MED ORDER — SENNOSIDES-DOCUSATE SODIUM 8.6-50 MG PO TABS
1.0000 | ORAL_TABLET | Freq: Two times a day (BID) | ORAL | Status: DC
  Administered 2017-11-09 – 2017-11-13 (×5): 1 via ORAL
  Filled 2017-11-08 (×12): qty 1

## 2017-11-08 MED ORDER — ADULT MULTIVITAMIN W/MINERALS CH: 1.0000 | ORAL_TABLET | Freq: Every day | ORAL | Status: DC

## 2017-11-08 MED ORDER — PANTOPRAZOLE SODIUM 40 MG PO TBEC
40.0000 mg | DELAYED_RELEASE_TABLET | Freq: Every day | ORAL | Status: DC
  Administered 2017-11-09 – 2017-11-13 (×5): 40 mg via ORAL
  Filled 2017-11-08 (×5): qty 1

## 2017-11-08 NOTE — Progress Notes (Signed)
[facility]  Patient will transport by [facility] [phone]. LCSW set up transport at 4:10 for 7:30 pick up.   LCSW notified RN of report number.   Please see [facility] Counselor note for additional information.   Beulah GandyBernette Kathern Lobosco, LSCW [facility] CSW [phone]

## 2017-11-08 NOTE — Discharge Summary (Signed)
Physician Discharge Summary  Chase Moss ZOX:[id number] DOB: 06/23/1966 DOA: 11/05/2017  PCP: Jackie Plumsei-Bonsu, George, MD  Admit date: 11/05/2017 Discharge date: 11/08/2017  Time spent: 60 minutes  Recommendations for Outpatient Follow-up:  1. Discharge to inpatient psychiatry.   Discharge Diagnoses:  Principal Problem:   MDD (major depressive disorder), recurrent severe, without psychosis (HCC) Active Problems:   Suicidal thoughts   Alcohol intoxication (HCC)   Polysubstance abuse (HCC)   Drug overdose   Discharge Condition: stable and improved.  Diet recommendation: regular diet  Filed Weights   11/05/17 0923  Weight: 77.6 kg (171 lb 1.2 oz)    History of present illness:  Per Dr.Kakrakandy  Chase DowGregory Borntreger is a 51 y.o. male with history of polysubstance abuse depression hypertension hyperlipidemia presented to the ER after patient stated he has wanted detox from alcohol but also claimed that he is depressed and suicidal and had taken alcohol along with mouthwash, rubbing alcohol and possibly antifreeze and Remeron.  Patient denied taking any salicylates or Tylenol.  Denied any chest pain shortness of breath headache visual symptoms or any weakness of the extremities.  Did not see exact quantities of what and how much he took.  ED Course: In the ER patient remained stable hemodynamically.  EKG showed normal sinus rhythm with QTC of 446 ms and QRS of 102 ms.  Patient was afebrile alert awake.  Depressed and suicidal.  Initial labs showed negative acetaminophen level salicylate levels positive for alcohol level at around 190.  Poison control was contacted and they recommended Fomepizole, pyridoxine and further observation.     Hospital Course:  #1 intentional drug overdose with suicidal ideation and depression Patient stated he drank a whole bottle of alcohol mouthwash as well as alcohol. The patient stated he thought about drinking antifreeze however did not.  Patient endorsed  suicidal ideation.  UDS was positive for cocaine and THC.  Salicylate level less than 7.  Acetone level negative.  Isopropanol negative, methanol negative, ethylene glycol negative.  Alcohol level is 190 on admission.  Patient remained afebrile. Patient was placed on IV fluids, supportive care. Sitter was placed. Patient has been seen by psychiatry and recommend inpatient psychiatry admission as well as resumption of abilify 5 mg daily, wellbutrin xl 150mg  daily and prazosin 1 mg daily. Patient remained with suicidal ideation and was medically stable for discharge to inpatient psychiatry admission.   2.  Alcohol abuse/polysubstance abuse  Patient wanting alcohol detox.  Patient was placed on Ativan withdrawal protocol, thiamine, folic acid.    3.  Hypertension Patient not taking any of his antihypertensive medications at home. Patient started on norvasc 10mg  daily and atenolol resumed.   4.  Hyperlipidemia Fasting lipid panel with an LDL of 84.  Patient maintained on home regimen of Pravachol.    Procedures:  None  Consultations:  Psychiatry: Dr. Sharma CovertNorman 11/06/2017      Discharge Exam: Vitals:   11/07/17 2254 11/08/17 0438  BP: 115/62 (!) 127/97  Pulse: 69 66  Resp: 20 18  Temp: 98.2 F (36.8 C) 97.7 F (36.5 C)  SpO2: 97% 99%    General: NAD Cardiovascular: RRR Respiratory: CTAB  Discharge Instructions   Discharge Instructions    Diet general   Complete by:  As directed    Increase activity slowly   Complete by:  As directed      Current Discharge Medication List    START taking these medications   Details  amLODipine (NORVASC) 10 MG tablet Take 1 tablet (  10 mg total) daily by mouth. Qty: 30 tablet, Refills: 1    folic acid (FOLVITE) 1 MG tablet Take 1 tablet (1 mg total) daily by mouth. Qty: 30 tablet, Refills: 0    Multiple Vitamin (MULTIVITAMIN WITH MINERALS) TABS tablet Take 1 tablet daily by mouth.    polyethylene glycol (MIRALAX / GLYCOLAX)  packet Take 17 g 2 (two) times daily by mouth. Qty: 14 each, Refills: 0    senna-docusate (SENOKOT-S) 8.6-50 MG tablet Take 1 tablet 2 (two) times daily by mouth.    thiamine 100 MG tablet Take 1 tablet (100 mg total) daily by mouth.      CONTINUE these medications which have CHANGED   Details  ARIPiprazole (ABILIFY) 5 MG tablet Take 1 tablet (5 mg total) daily by mouth. For mood control Qty: 30 tablet, Refills: 0    atenolol (TENORMIN) 50 MG tablet Take 1 tablet (50 mg total) daily by mouth. For high blood pressure Qty: 30 tablet, Refills: 0    buPROPion (WELLBUTRIN XL) 150 MG 24 hr tablet Take 1 tablet (150 mg total) daily by mouth. For depression Qty: 30 tablet, Refills: 0    hydrOXYzine (ATARAX/VISTARIL) 25 MG tablet Take 1 tablet (25 mg total) every 4 (four) hours as needed by mouth for anxiety (Sleep). Qty: 30 tablet, Refills: 0    pantoprazole (PROTONIX) 40 MG tablet Take 1 tablet (40 mg total) daily by mouth. For acid reflux Qty: 30 tablet, Refills: 0    pravastatin (PRAVACHOL) 40 MG tablet Take 1 tablet (40 mg total) every evening by mouth. For high cholesterol Qty: 30 tablet, Refills: 0    prazosin (MINIPRESS) 1 MG capsule Take 1 capsule (1 mg total) at bedtime by mouth. For nightmares Qty: 30 capsule, Refills: 0      STOP taking these medications     lisinopril (PRINIVIL,ZESTRIL) 5 MG tablet      mirtazapine (REMERON) 7.5 MG tablet        No Known Allergies    The results of significant diagnostics from this hospitalization (including imaging, microbiology, ancillary and laboratory) are listed below for reference.    Significant Diagnostic Studies: Ct Abdomen Pelvis W Contrast  Result Date: 10/22/2017 CLINICAL DATA:  Abdominal distension, nausea and vomiting. History of alcohol and substance abuse, hypertension. Off medication for 2 months. EXAM: CT ABDOMEN AND PELVIS WITH CONTRAST TECHNIQUE: Multidetector CT imaging of the abdomen and pelvis was performed  using the standard protocol following bolus administration of intravenous contrast. CONTRAST:  ISOVUE-300 IOPAMIDOL (ISOVUE-300) INJECTION 61% COMPARISON:  None. FINDINGS: LOWER CHEST: Dependent atelectasis. Heart size is upper limits of normal. No pericardial effusion. Mild coronary artery calcifications. Mildly thickened esophagus. HEPATOBILIARY: Liver and gallbladder are normal. PANCREAS: Normal. SPLEEN: Normal. ADRENALS/URINARY TRACT: Kidneys are orthotopic, demonstrating symmetric enhancement. No nephrolithiasis, hydronephrosis or solid renal masses. The unopacified ureters are normal in course and caliber. Delayed imaging through the kidneys demonstrates symmetric prompt contrast excretion within the proximal urinary collecting system. Urinary bladder is well distended and unremarkable. Normal adrenal glands. STOMACH/BOWEL: Mild gastric antral wall thickening first portion of the duodenum subcentimeter probable ulcer with probable gas (coronal 52/127) and inflammation. The small and large bowel are normal in course and caliber without inflammatory changes, sensitivity decreased without oral contrast. Normal appendix. VASCULAR/LYMPHATIC: Aortoiliac vessels are normal in course and caliber. Mild calcific atherosclerosis. No lymphadenopathy by CT size criteria. REPRODUCTIVE: Normal. OTHER: No intraperitoneal free fluid or free air. MUSCULOSKELETAL: Nonacute. Small fat containing umbilical hernia. 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 IMPRESSION: 1. Mild gastritis/ duodenitis with small suspected non perforated ulcer. 2. Mildly thickened esophagus without inflammation, possible esophagitis. 3. No cirrhosis or ascites. Electronically Signed   By: Awilda Metroourtnay  Bloomer M.D.   On: 10/22/2017 04:48    Microbiology: No results found for this or any previous visit (from the past 240 hour(s)).   Labs: Basic Metabolic Panel: Recent Labs  Lab 11/05/17 0240  11/05/17 0849 11/07/17 0419 11/08/17 0423  NA 141 141 137 137  K 3.5 3.9 3.4* 3.9  CL 106 108 107 104  CO2 23 24 23 24   GLUCOSE 94 85 99 88  BUN 13 14 13 13   CREATININE 1.00 0.95 0.99 1.01  CALCIUM 8.8* 8.5* 8.6* 8.7*   Liver Function Tests: Recent Labs  Lab 11/05/17 0240 11/05/17 0849  AST 24 25  ALT 23 22  ALKPHOS 71 67  BILITOT 0.8 0.4  PROT 7.5 7.0  ALBUMIN 4.2 4.0   No results for input(s): LIPASE, AMYLASE in the last 168 hours. No results for input(s): AMMONIA in the last 168 hours. CBC: Recent Labs  Lab 11/05/17 0240 11/05/17 0849 11/07/17 0419  WBC 7.9 6.3 6.9  NEUTROABS  --  3.1 3.8  HGB 17.4* 16.3 14.7  HCT 51.0 48.2 44.1  MCV 92.2 92.0 92.1  PLT 213 209 182   Cardiac Enzymes: No results for input(s): CKTOTAL, CKMB, CKMBINDEX, TROPONINI in the last 168 hours. BNP: BNP (last 3 results) No results for input(s): BNP in the last 8760 hours.  ProBNP (last 3 results) No results for input(s): PROBNP in the last 8760 hours.  CBG: No results for input(s): GLUCAP in the last 168 hours.     Signed:  Ramiro Harvestaniel Thompson MD.  [facility] 11/08/2017, 2:53 PM

## 2017-11-08 NOTE — Progress Notes (Signed)
Discharge report called to GG at [facility]. Transportation arranged with [facility]. Condition stable. Patient cooperative but anxious. PRN given.Melton Alarana A Chamille Werntz, RN

## 2017-11-08 NOTE — BH Assessment (Signed)
Patient has been accepted to [facility].  Accepting physician is Dr. Jennet MaduroPucilowska.  Attending Physician will be Dr. Mikey CollegeMcKnew.  Patient has been assigned to room 322, [facility] Charge Nurse Gigi.  Call report to [phone].  Representative/Transfer Coordinator is Warden/rangerCalvin Patient pre-admitted by [facility] Patient Access [facility](Gwen)  Staff Doristine Locks(Bernette J., Social Work) made aware of acceptance. [facility] Staff (Tina L., [facility]) made aware of acceptance.

## 2017-11-09 ENCOUNTER — Other Ambulatory Visit: Payer: Self-pay

## 2017-11-09 MED ORDER — TRAZODONE HCL 50 MG PO TABS
150.0000 mg | ORAL_TABLET | Freq: Every day | ORAL | Status: DC
  Administered 2017-11-09: 150 mg via ORAL
  Filled 2017-11-09: qty 3
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
## 2017-11-09 NOTE — Plan of Care (Signed)
Pt is newly admitted. Sad and depressed. Anxious and restless.

## 2017-11-09 NOTE — Progress Notes (Signed)
Patient was oriented to the unit. After admission, pt went to the dayroom, had a snack and talked to peers. Took medications and went to bed. Currently sleeping. Safety precautions maintained.

## 2017-11-09 NOTE — Plan of Care (Signed)
Denies SI/HI/AVH.  Stays to self.  No interaction noted with peers.  Minimal interaction with staff.  Reluctant to verbalize feelings.  No group attendance.  Medication compliant. Remains safe on the unit.  Safety rounds maintained.

## 2017-11-09 NOTE — [facility] Group Notes (Signed)
11/09/2017 1:15pm  Type of Therapy and Topic:  Group Therapy:  Fears and Unhealthy Coping Skills  Participation Level:  Did Not Attend    Terrel Nesheiwat  CUEBAS-COLON, LCSW 11/09/2017 12:25 PM   

## 2017-11-09 NOTE — Tx Team (Signed)
Initial Treatment Plan 11/09/2017 1:24 AM Chase Moss ZOX:[id number]    PATIENT STRESSORS: Loss of mother and sister Substance abuse   PATIENT STRENGTHS: Capable of independent living General fund of knowledge Motivation for treatment/growth   PATIENT IDENTIFIED PROBLEMS: Depression/suicide attempt  Anxiety  Grieving  Substance use               DISCHARGE CRITERIA:  Ability to meet basic life and health needs Improved stabilization in mood, thinking, and/or behavior Motivation to continue treatment in a less acute level of care Reduction of life-threatening or endangering symptoms to within safe limits Verbal commitment to aftercare and medication compliance  PRELIMINARY DISCHARGE PLAN: Outpatient therapy Return to previous living arrangement  PATIENT/FAMILY INVOLVEMENT: This treatment plan has been presented to and reviewed with the patient, Chase DowGregory Moss.  The patient  Has  been given the opportunity to ask questions and make suggestions.  Olin PiaByungura, Chase Sobol, RN 11/09/2017, 1:24 AM

## 2017-11-09 NOTE — Progress Notes (Signed)
Patient refused his Nicotine patch "I haven't been using it, I 've been fine.Chase Moss.Chase Moss."
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
## 2017-11-09 NOTE — Plan of Care (Signed)
Patient newly admitted. Anxious but cooperative. Verbalized understanding of treatment process and agrees to remain compliant.

## 2017-11-10 DIAGNOSIS — F333 Major depressive disorder, recurrent, severe with psychotic symptoms: Principal | ICD-10-CM

## 2017-11-10 MED ORDER — LISINOPRIL 5 MG PO TABS
5.0000 mg | ORAL_TABLET | Freq: Every day | ORAL | Status: DC
  Administered 2017-11-11 – 2017-11-13 (×3): 5 mg via ORAL
  Filled 2017-11-10 (×4): qty 1

## 2017-11-10 MED ORDER — TRAZODONE HCL 100 MG PO TABS
200.0000 mg | ORAL_TABLET | Freq: Every day | ORAL | Status: DC
  Administered 2017-11-10 – 2017-11-12 (×3): 200 mg via ORAL
  Filled 2017-11-10 (×3): qty 2

## 2017-11-10 NOTE — [facility] Group Notes (Signed)
11/10/2017 1:15pm  Type of Therapy and Topic:  Group Therapy:  Healthy Self Image and Positive Change  Participation Level:  Active   Description of Group:  In this group, patients will compare and contrast their current "I am...." statements to the visions they identify as desirable for their lives.  Patients discuss fears and how they can make positive changes in their cognitions that will positively impact their behaviors.  Facilitator played a motivational 3-minute speech and patients were left with the task of thinking about what "I am...." statements they can start using in their lives immediately.  Therapeutic Goals: 1. Patient will state their current self-perception as expressed in an "I Am" statement 2. Patient will contrast this with their desired vision for their live 3. Patient will identify 3 fears that negatively impact their behavior 4. Patient will discuss cognitive distortions that stem from their fears 5. Patient will verbalize statements that challenge their cognitive distortions  Summary of Patient Progress:  Pt stated, "I am determined" He reports that he feels better. Pt identified his cognitive distortions related to his fears and the challenges to stay positive. Pt actively participated in group.       Therapeutic Modalities Cognitive Behavioral Therapy Motivational Interviewing  Cambell Rickenbach  CUEBAS-COLON, LCSW 11/10/2017 12:41 PM

## 2017-11-10 NOTE — Progress Notes (Signed)
[facility] MD Progress Note  11/10/2017 12:32 PM Chase Moss  MRN:  [id number] Subjective:   Chase Moss a 51 y.o.malewithhistory of polysubstance abuse depression hypertension hyperlipidemia presents to the ER after patient states he has wanting detox from alcohol but also claimed that he is depressed and suicidal and had taken alcohol along with mouthwash, rubbing alcohol and possibly antifreeze and Remeron in an attempt to harm himself . Pt slept 2 hr 45 min , states his roommate was snoring. Pt anxious, endorse depression, denies SI. requesting double portion of meal. States his regular BP med were lisinopril, atenolol , not Norvasc, wanting to be on his regular meds although admits he was not taking his meds lately due to insurance issues.    Principal Problem: Major depressive disorder, recurrent, severe with psychotic features (HCC) Diagnosis:   Patient Active Problem List   Diagnosis Date Noted  . Intentional overdose of drug in tablet form (HCC) [T50.902A] 11/08/2017  . Dyslipidemia [E78.5] 11/08/2017  . Constipation [K59.00] 11/08/2017  . Drug overdose [T50.901A] 11/05/2017  . Acute alcoholic intoxication without complication (HCC) [F10.929]   . Suicidal ideation [R45.851]   . Alcohol intoxication (HCC) [F10.929] 10/22/2017  . Alcohol withdrawal (HCC) [F10.239] 10/22/2017  . Polysubstance abuse (HCC) [F19.10]   . 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 Cocaine use disorder, moderate, dependence (HCC) [F14.20] 03/31/2017  . HTN (hypertension) [I10] 06/20/2016  . MDD (major depressive disorder), recurrent severe, without psychosis (HCC) [F33.2] 06/20/2016  . Tobacco use disorder [F17.200] 06/20/2016  . Trauma [T14.90XA] 07/27/2015  . Cannabis use disorder, moderate, dependence (HCC) [F12.20]   . PTSD (post-traumatic stress disorder) [F43.10]   . Substance induced mood disorder (HCC) [F19.94] 07/07/2014  . Alcohol use  disorder, moderate, dependence (HCC) [F10.20] 04/10/2012  . Suicidal thoughts [R45.851] 04/04/2012   Total Time spent with patient: 25 min  Past Psychiatric History: no new info  Past Medical History:  Past Medical History:  Diagnosis Date  . Bipolar 1 disorder (HCC)   . Depression   . Hypertension   . PTSD (post-traumatic stress disorder)    History reviewed. No pertinent surgical history. Family History:  Family History  Problem Relation Age of Onset  . Heart attack Other    Family Psychiatric  History: no ne winfo Social History:  Social History   Substance and Sexual Activity  Alcohol Use Yes  . Alcohol/week: 1.2 oz  . Types: 2 Cans of beer per week   Comment: daily alcohol     Social History   Substance and Sexual Activity  Drug Use Yes  . Types: Cocaine, Marijuana   Comment: several times a day- pt reports hasn't used since detoxic     Social History   Socioeconomic History  . Marital status: Divorced    Spouse name: None  . Number of children: None  . Years of education: None  . Highest education level: None  Social Needs  . Financial resource strain: None  . Food insecurity - worry: None  . Food insecurity - inability: None  . Transportation needs - medical: None  . Transportation needs - non-medical: None  Occupational History  . None  Tobacco Use  . Smoking status: Current Every Day Smoker    Packs/day: 1.00    Years: 30.00    Pack years: 30.00    Types: Cigarettes  . Smokeless tobacco: Never Used  Substance and Sexual Activity  . Alcohol use: Yes    Alcohol/week: 1.2  oz    Types: 2 Cans of beer per week    Comment: daily alcohol  . Drug use: Yes    Types: Cocaine, Marijuana    Comment: several times a day- pt reports hasn't used since detoxic   . Sexual activity: Yes  Other Topics Concern  . None  Social History Narrative  . None   Additional Social History:                         Sleep: Poor  Appetite:   Good  Current Medications: Current Facility-Administered Medications  Medication Dose Route Frequency Provider Last Rate Last Dose  . acetaminophen (TYLENOL) tablet 650 mg  650 mg Oral Q6H PRN Pucilowska, Jolanta B, MD      . alum & mag hydroxide-simeth (MAALOX/MYLANTA) 200-200-20 MG/5ML suspension 30 mL  30 mL Oral Q4H PRN Pucilowska, Jolanta B, MD      . ARIPiprazole (ABILIFY) tablet 5 mg  5 mg Oral Daily Pucilowska, Jolanta B, MD   5 mg at 11/10/17 0812  . atenolol (TENORMIN) tablet 50 mg  50 mg Oral Daily Pucilowska, Jolanta B, MD   50 mg at 11/10/17 0813  . buPROPion (WELLBUTRIN XL) 24 hr tablet 150 mg  150 mg Oral Daily Pucilowska, Jolanta B, MD   150 mg at 11/10/17 [id number]  . hydrOXYzine (ATARAX/VISTARIL) tablet 50 mg  50 mg Oral TID PRN Pucilowska, Jolanta B, MD      . lisinopril (PRINIVIL,ZESTRIL) tablet 5 mg  5 mg Oral Daily Beverly SessionsSubedi, Blakelee Allington, MD      . magnesium hydroxide (MILK OF MAGNESIA) suspension 30 mL  30 mL Oral Daily PRN Pucilowska, Jolanta B, MD      . nicotine (NICODERM CQ - dosed in mg/24 hours) patch 21 mg  21 mg Transdermal Q0600 Pucilowska, Jolanta B, MD      . pantoprazole (PROTONIX) EC tablet 40 mg  40 mg Oral Daily Pucilowska, Jolanta B, MD   40 mg at 11/10/17 0812  . polyethylene glycol (MIRALAX / GLYCOLAX) packet 17 g  17 g Oral BID Pucilowska, Jolanta B, MD   17 g at 11/10/17 0813  . pravastatin (PRAVACHOL) tablet 40 mg  40 mg Oral QPM Pucilowska, Jolanta B, MD   40 mg at 11/09/17 1748  . prazosin (MINIPRESS) capsule 1 mg  1 mg Oral QHS Pucilowska, Jolanta B, MD   1 mg at 11/09/17 2119  . senna-docusate (Senokot-S) tablet 1 tablet  1 tablet Oral BID Pucilowska, Jolanta B, MD   1 tablet at 11/10/17 0815  . traZODone (DESYREL) tablet 200 mg  200 mg Oral QHS Beverly SessionsSubedi, Wisdom Seybold, MD        Lab Results: No results found for this or any previous visit (from the past 48 hour(s)).  Blood Alcohol level:  Lab Results  Component Value Date   ETH 190 (H) 11/05/2017   ETH <10  10/23/2017    Metabolic Disorder Labs: Lab Results  Component Value Date   HGBA1C 5.2 06/21/2016   MPG 100 04/09/2012   Lab Results  Component Value Date   PROLACTIN 21.0 (H) 06/21/2016   Lab Results  Component Value Date   CHOL 152 11/07/2017   TRIG 142 11/07/2017   HDL 40 (L) 11/07/2017   CHOLHDL 3.8 11/07/2017   VLDL 28 11/07/2017   LDLCALC 84 11/07/2017   LDLCALC 88 06/21/2016    Physical Findings: AIMS: Facial and Oral Movements Muscles of Facial Expression: None, normal Lips  and Perioral Area: None, normal Jaw: None, normal Tongue: None, normal,Extremity Movements Upper (arms, wrists, hands, fingers): None, normal Lower (legs, knees, ankles, toes): None, normal, Trunk Movements Neck, shoulders, hips: None, normal, Overall Severity Severity of abnormal movements (highest score from questions above): None, normal Incapacitation due to abnormal movements: None, normal Patient's awareness of abnormal movements (rate only patient's report): No Awareness, Dental Status Current problems with teeth and/or dentures?: No Does patient usually wear dentures?: No  CIWA:  CIWA-Ar Total: 2 COWS:  COWS Total Score: 0  Musculoskeletal: Strength & Muscle Tone: within normal limits Gait & Station: normal Patient leans:   Psychiatric Specialty Exam: Physical Exam  Nursing note and vitals reviewed.   ROS  Blood pressure (!) 145/88, pulse 77, temperature 98 F (36.7 C), temperature source Oral, resp. rate 18, height 5\' 10"  (1.778 m), weight 76.2 kg (168 lb).Body mass index is 24.11 kg/m.  General Appearance:Well Groomed, Caucasian male with shaved head and a hospital gown who is lying in bed. NAD.  Eye Contact:Good  Speech:Clear and Coherent and Normal Rate  Volume:Normal  Mood:Depressed  Affect:Congruent and Depressed  Thought Process:Goal Directed and Linear  Orientation:Full (Time, Place, and Person)  Thought Content:Logical  Suicidal  Thoughts:denies  Homicidal Thoughts:denies  Memory:Immediate;Good Recent;Good Remote;Good  Judgement:impaired  Insight:Fair  Psychomotor Activity:Normal  Concentration:Concentration:Goodand Attention Span: Good  Recall:Good  Fund of Knowledge:Good  Language:Good  Akathisia:No  Handed:Right  AIMS (if indicated):N/A  Assets:Communication Skills Desire for Improvement Financial Resources/Insurance Housing  ADL's:Intact  Cognition:WNL  Sleep:2.75 hrs      Treatment Plan Summary: Daily contact with patient to assess and evaluate symptoms and progress in treatment and Medication management  Tarrence Enck a51 y.o.male who was admittedfor OD on mouthwash and Remeron with alcohol and antifreeze.  He reports a decline in his mood for several months. He reports depressive symptoms, anxiety and chronic SI. He also reports abusing alcohol, using THC daily.  He did well on his prior medication regimen , restarted.Pt depressed, anxious.  Treatment Plan Summary:  -Restartedhome medications:Wellbutrin XL 150 mg daily, Abilify 5 mg daily, Prazosin 1 mg qhs for nightmares and Atarax 25 mg q 4 hours PRN.  not restarted Remeron since patient overdosed on this medication and he reports hypersomnia.  -RecentQTc 446 on 11/13. -Increase trazodone for sleep -restart lisinopril, d/c norvasc.   Observation Level/Precautions:  15 minute checks  Laboratory:  CBC Chemistry Profile UDS UA, TSH  Psychotherapy:    Medications:    Consultations:    Discharge Concerns:    Estimated LOS:  Other:  EKG   Physician Treatment Plan for Primary Diagnosis: Major depressive disorder, recurrent, severe with psychotic features (HCC) Long Term Goal(s): Improvement in symptoms so as ready for discharge  Short Term Goals: Ability to identify changes in lifestyle to reduce recurrence of condition will improve, Ability to verbalize feelings will improve,  Ability to disclose and discuss suicidal ideas, Ability to demonstrate self-control will improve, Ability to identify and develop effective coping behaviors will improve, Ability to maintain clinical measurements within normal limits will improve, Compliance with prescribed medications will improve and Ability to identify triggers associated with substance abuse/mental health issues will improve  Physician Treatment Plan for Secondary Diagnosis: Principal Problem:   Major depressive disorder, recurrent, severe with psychotic features (HCC) Active Problems:   Alcohol use disorder, moderate, dependence (HCC)   Cannabis use disorder, moderate, dependence (HCC)   PTSD (post-traumatic stress disorder)   HTN (hypertension)   Tobacco use disorder   Cocaine  use disorder, moderate, dependence (HCC)   Suicidal ideation   Intentional overdose of drug in tablet form (HCC)   Dyslipidemia   Constipation  Long Term Goal(s): Improvement in symptoms so as ready for discharge  Short Term Goals: Ability to identify changes in lifestyle to reduce recurrence of condition will improve, Ability to verbalize feelings will improve, Ability to disclose and discuss suicidal ideas, Ability to demonstrate self-control will improve, Ability to identify and develop effective coping behaviors will improve, Ability to maintain clinical measurements within normal limits will improve, Compliance with prescribed medications will improve and Ability to identify triggers associated with substance abuse/mental health issues will improve  I certify that inpatient services furnished can reasonably be expected to improve the patient's condition.        Beverly SessionsJagannath Myrka Sylva, MD 11/10/2017, 12:32 PM

## 2017-11-10 NOTE — Progress Notes (Signed)
Patient stayed in the milieu. Cooperative and compliant with treatment plan. Reports that he did not participate in groups but was able to go outside with peers. Anxious but able to discuss his feelings. Was seen in evening activities in the dayroom. Maintained a positive attitude. Patient had his HS medications, had a snack and went to bed. Currently resting. No sign of discomfort. Safety precautions maintained.

## 2017-11-10 NOTE — Plan of Care (Signed)
Patient pleasant and cooperative with care. Medication and group compliant. Appropriate with staff and peers. Denies SI, HI, AVH. Pt reports anxiety levels are down. Requested room change due to hx PTSD, does not want a roommate.  Encouragement and support offered. Safety checks maintained. Medications given as prescribed. Pt receptive and remains safe on unit with q 15 min checks.

## 2017-11-10 NOTE — Plan of Care (Signed)
Alert and oriented. Denying thoughts of self harm. Denying hallucinations. Compliant with treatment program.

## 2017-11-11 MED ORDER — PRAZOSIN HCL 2 MG PO CAPS
2.0000 mg | ORAL_CAPSULE | Freq: Every day | ORAL | Status: DC
  Administered 2017-11-11: 2 mg via ORAL
  Filled 2017-11-11 (×2): qty 1

## 2017-11-11 NOTE — Tx Team (Signed)
Interdisciplinary Treatment and Diagnostic Plan Update  11/11/2017 Time of Session: 11:00am Chase Moss MRN: [id number]  Principal Diagnosis: Major depressive disorder, recurrent, severe with psychotic features (HCC)  Secondary Diagnoses: Principal Problem:   Major depressive disorder, recurrent, severe with psychotic features (HCC) Active Problems:   Alcohol use disorder, moderate, dependence (HCC)   Cannabis use disorder, moderate, dependence (HCC)   PTSD (post-traumatic stress disorder)   HTN (hypertension)   Tobacco use disorder   Cocaine use disorder, moderate, dependence (HCC)   Suicidal ideation   Intentional overdose of drug in tablet form (HCC)   Dyslipidemia   Constipation   Current Medications:  Current Facility-Administered Medications  Medication Dose Route Frequency Provider Last Rate Last Dose  . acetaminophen (TYLENOL) tablet 650 mg  650 mg Oral Q6H PRN Pucilowska, Jolanta B, MD      . alum & mag hydroxide-simeth (MAALOX/MYLANTA) 200-200-20 MG/5ML suspension 30 mL  30 mL Oral Q4H PRN Pucilowska, Jolanta B, MD      . ARIPiprazole (ABILIFY) tablet 5 mg  5 mg Oral Daily Pucilowska, Jolanta B, MD   5 mg at 11/11/17 0937  . atenolol (TENORMIN) tablet 50 mg  50 mg Oral Daily Pucilowska, Jolanta B, MD   50 mg at 11/11/17 0938  . buPROPion (WELLBUTRIN XL) 24 hr tablet 150 mg  150 mg Oral Daily Pucilowska, Jolanta B, MD   150 mg at 11/11/17 0937  . hydrOXYzine (ATARAX/VISTARIL) tablet 50 mg  50 mg Oral TID PRN Pucilowska, Jolanta B, MD      . lisinopril (PRINIVIL,ZESTRIL) tablet 5 mg  5 mg Oral Daily Beverly Sessions, MD   5 mg at 11/11/17 0938  . magnesium hydroxide (MILK OF MAGNESIA) suspension 30 mL  30 mL Oral Daily PRN Pucilowska, Jolanta B, MD      . nicotine (NICODERM CQ - dosed in mg/24 hours) patch 21 mg  21 mg Transdermal Q0600 Pucilowska, Jolanta B, MD      . pantoprazole (PROTONIX) EC tablet 40 mg  40 mg Oral Daily Pucilowska, Jolanta B, MD   40 mg at 11/11/17  0936  . polyethylene glycol (MIRALAX / GLYCOLAX) packet 17 g  17 g Oral BID Pucilowska, Jolanta B, MD   17 g at 11/10/17 1631  . pravastatin (PRAVACHOL) tablet 40 mg  40 mg Oral QPM Pucilowska, Jolanta B, MD   40 mg at 11/10/17 1630  . prazosin (MINIPRESS) capsule 2 mg  2 mg Oral QHS McNew, Holly R, MD      . senna-docusate (Senokot-S) tablet 1 tablet  1 tablet Oral BID Pucilowska, Jolanta B, MD   1 tablet at 11/10/17 1630  . traZODone (DESYREL) tablet 200 mg  200 mg Oral QHS Beverly Sessions, MD   200 mg at 11/10/17 2117   PTA Medications: Medications Prior to Admission  Medication Sig Dispense Refill Last Dose  . amLODipine (NORVASC) 10 MG tablet Take 1 tablet (10 mg total) daily by mouth. 30 tablet 1 11/09/2017 at 0837  . ARIPiprazole (ABILIFY) 5 MG tablet Take 1 tablet (5 mg total) daily by mouth. For mood control 30 tablet 0 11/09/2017 at 0837  . atenolol (TENORMIN) 50 MG tablet Take 1 tablet (50 mg total) daily by mouth. For high blood pressure 30 tablet 0 Past Week at Unknown time  . buPROPion (WELLBUTRIN XL) 150 MG 24 hr tablet Take 1 tablet (150 mg total) daily by mouth. For depression 30 tablet 0 11/09/2017 at 0836  . folic acid (FOLVITE) 1 MG tablet Take  1 tablet (1 mg total) daily by mouth. 30 tablet 0 unknown at unknown  . hydrOXYzine (ATARAX/VISTARIL) 25 MG tablet Take 1 tablet (25 mg total) every 4 (four) hours as needed by mouth for anxiety (Sleep). 30 tablet 0 prn at prn  . pantoprazole (PROTONIX) 40 MG tablet Take 1 tablet (40 mg total) daily by mouth. For acid reflux 30 tablet 0 11/09/2017 at 0837  . polyethylene glycol (MIRALAX / GLYCOLAX) packet Take 17 g 2 (two) times daily by mouth. 14 each 0 11/09/2017 at 0830  . pravastatin (PRAVACHOL) 40 MG tablet Take 1 tablet (40 mg total) every evening by mouth. For high cholesterol 30 tablet 0 Past Week at unknown  . prazosin (MINIPRESS) 1 MG capsule Take 1 capsule (1 mg total) at bedtime by mouth. For nightmares 30 capsule 0  11/09/2017 at 0024  . senna-docusate (SENOKOT-S) 8.6-50 MG tablet Take 1 tablet 2 (two) times daily by mouth.   11/09/2017 at 0025  . Multiple Vitamin (MULTIVITAMIN WITH MINERALS) TABS tablet Take 1 tablet daily by mouth.   unknown at unknown  . thiamine 100 MG tablet Take 1 tablet (100 mg total) daily by mouth.   unknown at unknown    Patient Stressors: Loss of mother and sister Substance abuse  Patient Strengths: Capable of independent living General fund of knowledge Motivation for treatment/growth  Treatment Modalities: Medication Management, Group therapy, Case management,  1 to 1 session with clinician, Psychoeducation, Recreational therapy.   Physician Treatment Plan for Primary Diagnosis: Major depressive disorder, recurrent, severe with psychotic features (HCC) Long Term Goal(s): Improvement in symptoms so as ready for discharge Improvement in symptoms so as ready for discharge   Short Term Goals: Ability to identify changes in lifestyle to reduce recurrence of condition will improve Ability to verbalize feelings will improve Ability to disclose and discuss suicidal ideas Ability to demonstrate self-control will improve Ability to identify and develop effective coping behaviors will improve Ability to maintain clinical measurements within normal limits will improve Compliance with prescribed medications will improve Ability to identify triggers associated with substance abuse/mental health issues will improve Ability to identify changes in lifestyle to reduce recurrence of condition will improve Ability to verbalize feelings will improve Ability to disclose and discuss suicidal ideas Ability to demonstrate self-control will improve Ability to identify and develop effective coping behaviors will improve Ability to maintain clinical measurements within normal limits will improve Compliance with prescribed medications will improve Ability to identify triggers associated with  substance abuse/mental health issues will improve  Medication Management: Evaluate patient's response, side effects, and tolerance of medication regimen.  Therapeutic Interventions: 1 to 1 sessions, Unit Group sessions and Medication administration.  Evaluation of Outcomes: Progressing  Physician Treatment Plan for Secondary Diagnosis: Principal Problem:   Major depressive disorder, recurrent, severe with psychotic features (HCC) Active Problems:   Alcohol use disorder, moderate, dependence (HCC)   Cannabis use disorder, moderate, dependence (HCC)   PTSD (post-traumatic stress disorder)   HTN (hypertension)   Tobacco use disorder   Cocaine use disorder, moderate, dependence (HCC)   Suicidal ideation   Intentional overdose of drug in tablet form (HCC)   Dyslipidemia   Constipation  Long Term Goal(s): Improvement in symptoms so as ready for discharge Improvement in symptoms so as ready for discharge   Short Term Goals: Ability to identify changes in lifestyle to reduce recurrence of condition will improve Ability to verbalize feelings will improve Ability to disclose and discuss suicidal ideas Ability to demonstrate self-control  will improve Ability to identify and develop effective coping behaviors will improve Ability to maintain clinical measurements within normal limits will improve Compliance with prescribed medications will improve Ability to identify triggers associated with substance abuse/mental health issues will improve Ability to identify changes in lifestyle to reduce recurrence of condition will improve Ability to verbalize feelings will improve Ability to disclose and discuss suicidal ideas Ability to demonstrate self-control will improve Ability to identify and develop effective coping behaviors will improve Ability to maintain clinical measurements within normal limits will improve Compliance with prescribed medications will improve Ability to identify triggers  associated with substance abuse/mental health issues will improve     Medication Management: Evaluate patient's response, side effects, and tolerance of medication regimen.  Therapeutic Interventions: 1 to 1 sessions, Unit Group sessions and Medication administration.  Evaluation of Outcomes: Progressing   RN Treatment Plan for Primary Diagnosis: Major depressive disorder, recurrent, severe with psychotic features (HCC) Long Term Goal(s): Knowledge of disease and therapeutic regimen to maintain health will improve  Short Term Goals: Ability to remain free from injury will improve, Ability to identify and develop effective coping behaviors will improve and Compliance with prescribed medications will improve  Medication Management: RN will administer medications as ordered by provider, will assess and evaluate patient's response and provide education to patient for prescribed medication. RN will report any adverse and/or side effects to prescribing provider.  Therapeutic Interventions: 1 on 1 counseling sessions, Psychoeducation, Medication administration, Evaluate responses to treatment, Monitor vital signs and CBGs as ordered, Perform/monitor CIWA, COWS, AIMS and Fall Risk screenings as ordered, Perform wound care treatments as ordered.  Evaluation of Outcomes: Progressing   LCSW Treatment Plan for Primary Diagnosis: Major depressive disorder, recurrent, severe with psychotic features (HCC) Long Term Goal(s): Safe transition to appropriate next level of care at discharge, Engage patient in therapeutic group addressing interpersonal concerns.  Short Term Goals: Engage patient in aftercare planning with referrals and resources, Identify triggers associated with mental health/substance abuse issues and Increase skills for wellness and recovery  Therapeutic Interventions: Assess for all discharge needs, 1 to 1 time with Social worker, Explore available resources and support systems, Assess for  adequacy in community support network, Educate family and significant other(s) on suicide prevention, Complete Psychosocial Assessment, Interpersonal group therapy.  Evaluation of Outcomes: Progressing   Progress in Treatment: Attending groups: Yes. Participating in groups: Yes. Taking medication as prescribed: Yes. Toleration medication: Yes. Family/Significant other contact made: No, will contact:    Patient understands diagnosis: Yes. Discussing patient identified problems/goals with staff: Yes. Medical problems stabilized or resolved: Yes. Denies suicidal/homicidal ideation: Yes. Issues/concerns per patient self-inventory: No  Other:    New problem(s) identified: No, Describe:     New Short Term/Long Term Goal(s):  Discharge Plan or Barriers: Discharge home with [facility]  Reason for Continuation of Hospitalization: Depression Medication stabilization  Estimated Length of Stay: 1-2 days  Recreational Therapy: Patient Stressors: Death, Medication Patient Goal: Patient will identify 3 positive coping strategies to use post discharge x5 days.   Attendees: Patient:Chase Moss 11/11/2017 4:59 PM  Physician: Corinna GabHolly McNew, MD 11/11/2017 4:59 PM  Nursing: Hulan AmatoGwen Farrish, RN 11/11/2017 4:59 PM  RN Care Manager: 11/11/2017 4:59 PM  Social Worker: Jake SharkSara Laws, LCSW 11/11/2017 4:59 PM  Recreational Therapist: Garret ReddishShay Jaxon Flatt, LRT 11/11/2017 4:59 PM  Other:  11/11/2017 4:59 PM  Other:  11/11/2017 4:59 PM  Other: 11/11/2017 4:59 PM    Scribe for Treatment Team: Glennon MacSara P Laws,  LCSW 11/11/2017 4:59 PM

## 2017-11-11 NOTE — Progress Notes (Signed)
Received Chase Moss this am after breakfast, he was compliant with his medications. He denied all of the psychiatric symptoms. He is feeling good about his transition plans to [facility]. He is OOB in the milieu, attending the group therapy sessions and socializing with select peers. His mood this PM continued to be stable.

## 2017-11-11 NOTE — [facility] Group Notes (Signed)
11/11/2017 0930   Type of Therapy and Topic:  Group Therapy:  Overcoming Obstacles   Participation Level:  Minimal   Description of Group:   In this group patients will be encouraged to explore what they see as obstacles to their own wellness and recovery. They will be guided to discuss their thoughts, feelings, and behaviors related to these obstacles. The group will process together ways to cope with barriers, with attention given to specific choices patients can make. Each patient will be challenged to identify changes they are motivated to make in order to overcome their obstacles. This group will be process-oriented, with patients participating in exploration of their own experiences, giving and receiving support, and processing challenge from other group members.   Therapeutic Goals: 1. Patient will identify personal and current obstacles as they relate to admission. 2. Patient will identify barriers that currently interfere with their wellness or overcoming obstacles.  3. Patient will identify feelings, thought process and behaviors related to these barriers. 4. Patient will identify two changes they are willing to make to overcome these obstacles:      Summary of Patient Progress  Pt continues to work towards his treatment goals but has not yet reached them. Pt was able to participate in group discussions appropriately when prompted by CSW and was able to offer support and validation to other group members. Pt reported his short term and long term obstacles are, "nightmares and drinking." Pt reported, "I drink so I don't have nightmares." Pt stated that something he can do to work to overcome his obstacles are, "getting on the right medications to address my nightmares."    Therapeutic Modalities:   Cognitive Behavioral Therapy Solution Focused Therapy Motivational Interviewing Relapse Prevention Therapy  Heidi DachKelsey Jayquon Theiler, MSW, LCSW 11/11/2017 12:41 PM

## 2017-11-11 NOTE — Progress Notes (Signed)
Recreation Therapy Notes   Date: 11.19.18  Time: 1:00 pm  Location: Craft Room  Behavioral response: N/A  Intervention Topic: Coping Skills  Discussion/Intervention: Patient did not attend group. Clinical Observations/Feedback:  Patient did not attend group.  [name] LRT/CTRS         Gigi Onstad 11/11/2017 2:39 PM

## 2017-11-11 NOTE — Progress Notes (Signed)
Recreation Therapy Notes  INPATIENT RECREATION THERAPY ASSESSMENT  Patient Details Name: Chase Moss MRN: [id number] DOB: 06/25/1966 Today's Date: 11/11/2017  Patient Stressors: Death, Other (Comment)(Medication)  Coping Skills:   Isolate, Substance Abuse  Personal Challenges: Anger, Communication, Concentration, Problem-Solving, Social Interaction, Stress Management, Substance Abuse, Time Management, Trusting Others  Leisure Interests (2+):  [place] Daw[place] - Camping, Technical brewerature - [employer]ishing, Technical brewerature - Other (Comment)(Canoe, Play with animals)  Awareness of Community Resources:  Yes  Community Resources:  Park  Current Use: Yes  If no, Barriers?:    Patient Strengths:  Good with animals and being in the wilderness.  Patient Identified Areas of Improvement:  Change attitude toward waking up everyday.  Current Recreation Participation:  Fishing, Camping  Patient Goal for Hospitalization:  Get back on medication nand change outlook on life.  [place]  [place] Residence:  [place]   Current [place] (including self-harm):  No  Current HI:  No  Consent to Intern Participation: N/A   Shiesha Jahn 11/11/2017, 3:14 PM

## 2017-11-11 NOTE — Progress Notes (Signed)
Patient is stable and slept long hours in bed without any disturbance respiration is normal no distress noted 15 minute checks continued.

## 2017-11-11 NOTE — [facility] Group Notes (Signed)
[facility] Group Notes:  (Nursing/MHT/Case Management/Adjunct)  Date:  11/11/2017  Time:  2:28 AM  Type of Therapy:  Psychoeducational Skills  Participation Level:  None   Wilson SingerJustin  Garcia Dalzell 11/11/2017, 2:28 AM

## 2017-11-11 NOTE — [facility] Group Notes (Signed)
[facility] Group Notes:  (Nursing/MHT/Case Management/Adjunct)  Date:  11/11/2017  Time:  11:18 PM  Type of Therapy:  Evening Wrap-up Group  Participation Level:  Active  Participation Quality:  Appropriate and Attentive  Affect:  Appropriate  Cognitive:  Alert and Appropriate  Insight:  Appropriate, Good and Improving  Engagement in Group:  Developing/Improving and Engaged  Modes of Intervention:  Discussion  Summary of Progress/Problems:  Chase MorrowChelsea Moss Chase Moss 11/11/2017, 11:18 PM
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
## 2017-11-11 NOTE — [facility] Group Notes (Signed)
[facility] Group Notes:  (Nursing/MHT/Case Management/Adjunct)  Date:  11/11/2017  Time:  3:35 PM  Type of Therapy:  Psychoeducational Skills  Participation Level:  Active  Participation Quality:  Appropriate, Attentive and Sharing  Affect:  Appropriate  Cognitive:  Appropriate and Oriented  Insight:  Appropriate  Engagement in Group:  Engaged  Modes of Intervention:  Discussion and Education  Summary of Progress/Problems:  Mickey Farberamela M Moses Ellison 11/11/2017, 3:35 PM

## 2017-11-11 NOTE — Progress Notes (Signed)
Patient ID: Chase Moss, male   DOB: [birth year], 51 y.o.   MRN: [id number] CSW met with Pt who is agreeable to [place] notified him of scheduled intake appointment.  He verbalizes excitement about this and says he plans to go back home then go by bus to [facility] at his appointment time, 11/27, 7:45am.  Dossie Arbour, LCSW

## 2017-11-12 MED ORDER — LISINOPRIL 5 MG PO TABS: 5.0000 mg | ORAL_TABLET | Freq: Every day | ORAL | 0 refills | Status: DC

## 2017-11-12 MED ORDER — PRAZOSIN HCL 5 MG PO CAPS: 5.0000 mg | ORAL_CAPSULE | Freq: Every day | ORAL | 0 refills | Status: DC

## 2017-11-12 MED ORDER — BUPROPION HCL ER (XL) 150 MG PO TB24: 150.0000 mg | ORAL_TABLET | Freq: Every day | ORAL | 0 refills | Status: DC

## 2017-11-12 MED ORDER — AMLODIPINE BESYLATE 10 MG PO TABS: 10.0000 mg | ORAL_TABLET | Freq: Every day | ORAL | 0 refills | Status: DC

## 2017-11-12 MED ORDER — PRAVASTATIN SODIUM 40 MG PO TABS: 40.0000 mg | ORAL_TABLET | Freq: Every evening | ORAL | 0 refills | Status: DC

## 2017-11-12 MED ORDER — TRAZODONE HCL 100 MG PO TABS: 200.0000 mg | ORAL_TABLET | Freq: Every day | ORAL | 0 refills | Status: DC

## 2017-11-12 MED ORDER — ARIPIPRAZOLE 5 MG PO TABS: 5.0000 mg | ORAL_TABLET | Freq: Every day | ORAL | 0 refills | Status: DC

## 2017-11-12 MED ORDER — PRAZOSIN HCL 1 MG PO CAPS
3.0000 mg | ORAL_CAPSULE | Freq: Every day | ORAL | Status: DC
  Administered 2017-11-12: 21:00:00 3 mg via ORAL
  Filled 2017-11-12: qty 1

## 2017-11-12 MED ORDER — ATENOLOL 50 MG PO TABS: 50.0000 mg | ORAL_TABLET | Freq: Every day | ORAL | 0 refills | Status: DC

## 2017-11-12 NOTE — [facility] Suicide Risk Assessment (Signed)
[facility] INPATIENT:  Family/Significant Other Suicide Prevention Education  Suicide Prevention Education: completed with pt. Suicide prevention pamphlet reviewed with pt. Pt expressed understanding of the information provided. Pt reported that he does not have access to weapons/other means of self harm, and stated he is in agreement with his discharge plan. CSW will continue to support pt.  Patient Refusal for Family/Significant Other Suicide Prevention Education: The patient Fredna DowGregory Bielicki has refused to provide written consent for family/significant other to be provided Family/Significant Other Suicide Prevention Education during admission and/or prior to discharge.  Physician notified.  Heidi DachKelsey Tymara Saur, LCSW 11/12/2017, 11:43 AM

## 2017-11-12 NOTE — Progress Notes (Signed)
Recreation Therapy Notes   Date: 11.20.18  Time: 1:00 pm  Location: Craft Room  Behavioral response: N/A  Intervention Topic: Time Management  Discussion/Intervention: Patient did not attend group. Clinical Observations/Feedback:  Patient did not attend group. Bayler Gehrig LRT/CTRS         Chase Moss 11/12/2017 2:33 PM

## 2017-11-12 NOTE — Progress Notes (Addendum)
[facility]  11/12/2017 8:14 AM Fredna DowGregory Fundora  MRN:  [id number]   Subjective:  Pt states that he is starting to feel better today now that he is back on medications. HE states that suicidal thoughts are improving and has not had any suicidal thoughts today so far. He also denies any thoughts or intent of hurting others. He states, "I am determined to stay sober. When I put my mind to something I will get it done." He has an appointment with [facility] next Tuesday and is looking forward to recovery again. He states, "It's a new year." When asked how he plans to stay sober for a week, he states, "I'm just very determined and I don't plan to drink or use anything." Discussed that he could also attend meetings and he agreed to this. He states that he is also going to stop smoking and does not want a patch or anything to help him. He wants to be out for Thanksgiving and plans to spend it with his brother in law since its his first thanksgiving without his wife. Pt has been perseverative and irritable about getting double portions of meals and controlling the temperature in his room. He slept well last night.  Principal Problem: Major depressive disorder, recurrent, severe with psychotic features (HCC) Diagnosis:   Patient Active Problem List   Diagnosis Date Noted  . Intentional overdose of drug in tablet form (HCC) [T50.902A] 11/08/2017  . Dyslipidemia [E78.5] 11/08/2017  . Constipation [K59.00] 11/08/2017  . Drug overdose [T50.901A] 11/05/2017  . Acute alcoholic intoxication without complication (HCC) [F10.929]   . Suicidal ideation [R45.851]   . Alcohol intoxication (HCC) [F10.929] 10/22/2017  . Alcohol withdrawal (HCC) [F10.239] 10/22/2017  . Polysubstance abuse (HCC) [F19.10]   . 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 Cocaine use disorder, moderate, dependence (HCC) [F14.20] 03/31/2017  . HTN (hypertension) [I10] 06/20/2016  .  MDD (major depressive disorder), recurrent severe, without psychosis (HCC) [F33.2] 06/20/2016  . Tobacco use disorder [F17.200] 06/20/2016  . Trauma [T14.90XA] 07/27/2015  . Cannabis use disorder, moderate, dependence (HCC) [F12.20]   . PTSD (post-traumatic stress disorder) [F43.10]   . Substance induced mood disorder (HCC) [F19.94] 07/07/2014  . Alcohol use disorder, moderate, dependence (HCC) [F10.20] 04/10/2012  . Suicidal thoughts [R45.851] 04/04/2012   Total Time spent with patient: 20 minutes  Past Psychiatric History: See H&P  Past Medical History:  Past Medical History:  Diagnosis Date  . Bipolar 1 disorder (HCC)   . Depression   . Hypertension   . PTSD (post-traumatic stress disorder)    History reviewed. No pertinent surgical history. Family History:  Family History  Problem Relation Age of Onset  . Heart attack Other    Family Psychiatric  History: See H&P Social History:  Social History   Substance and Sexual Activity  Alcohol Use Yes  . Alcohol/week: 1.2 oz  . Types: 2 Cans of beer per week   Comment: daily alcohol     Social History   Substance and Sexual Activity  Drug Use Yes  . Types: Cocaine, Marijuana   Comment: several times a day- pt reports hasn't used since detoxic     Social History   Socioeconomic History  . Marital status: Divorced    Spouse name: None  . Number of children: None  . Years of education: None  . Highest education level: None  Social Needs  . Financial resource strain: None  . Food insecurity -  worry: None  . Food insecurity - inability: None  . Transportation needs - medical: None  . Transportation needs - non-medical: None  Occupational History  . None  Tobacco Use  . Smoking status: Current Every Day Smoker    Packs/day: 1.00    Years: 30.00    Pack years: 30.00    Types: Cigarettes  . Smokeless tobacco: Never Used  Substance and Sexual Activity  . Alcohol use: Yes    Alcohol/week: 1.2 oz    Types: 2 Cans  of beer per week    Comment: daily alcohol  . Drug use: Yes    Types: Cocaine, Marijuana    Comment: several times a day- pt reports hasn't used since detoxic   . Sexual activity: Yes  Other Topics Concern  . None  Social History Narrative  . None   Additional Social History:                         Sleep: Good  Appetite:  Good  Current Medications: Current Facility-Administered Medications  Medication Dose Route Frequency Provider Last Rate Last Dose  . acetaminophen (TYLENOL) tablet 650 mg  650 mg Oral Q6H PRN Pucilowska, Jolanta B, MD   650 mg at 11/11/17 2047  . alum & mag hydroxide-simeth (MAALOX/MYLANTA) 200-200-20 MG/5ML suspension 30 mL  30 mL Oral Q4H PRN Pucilowska, Jolanta B, MD      . ARIPiprazole (ABILIFY) tablet 5 mg  5 mg Oral Daily Pucilowska, Jolanta B, MD   5 mg at 11/11/17 0937  . atenolol (TENORMIN) tablet 50 mg  50 mg Oral Daily Pucilowska, Jolanta B, MD   50 mg at 11/11/17 0938  . buPROPion (WELLBUTRIN XL) 24 hr tablet 150 mg  150 mg Oral Daily Pucilowska, Jolanta B, MD   150 mg at 11/11/17 0937  . hydrOXYzine (ATARAX/VISTARIL) tablet 50 mg  50 mg Oral TID PRN Pucilowska, Jolanta B, MD      . lisinopril (PRINIVIL,ZESTRIL) tablet 5 mg  5 mg Oral Daily Beverly SessionsSubedi, Jagannath, MD   5 mg at 11/11/17 0938  . magnesium hydroxide (MILK OF MAGNESIA) suspension 30 mL  30 mL Oral Daily PRN Pucilowska, Jolanta B, MD      . nicotine (NICODERM CQ - dosed in mg/24 hours) patch 21 mg  21 mg Transdermal Q0600 Pucilowska, Jolanta B, MD      . pantoprazole (PROTONIX) EC tablet 40 mg  40 mg Oral Daily Pucilowska, Jolanta B, MD   40 mg at 11/11/17 0936  . polyethylene glycol (MIRALAX / GLYCOLAX) packet 17 g  17 g Oral BID Pucilowska, Jolanta B, MD   17 g at 11/10/17 1631  . pravastatin (PRAVACHOL) tablet 40 mg  40 mg Oral QPM Pucilowska, Jolanta B, MD   40 mg at 11/11/17 1804  . prazosin (MINIPRESS) capsule 2 mg  2 mg Oral QHS Alycea Segoviano, Ileene HutchinsonHolly R, MD   2 mg at 11/11/17 2216  .  senna-docusate (Senokot-S) tablet 1 tablet  1 tablet Oral BID Pucilowska, Jolanta B, MD   1 tablet at 11/10/17 1630  . traZODone (DESYREL) tablet 200 mg  200 mg Oral QHS Beverly SessionsSubedi, Jagannath, MD   200 mg at 11/11/17 2215    Lab Results: No results found for this or any previous visit (from the past 48 hour(s)).  Blood Alcohol level:  Lab Results  Component Value Date   ETH 190 (H) 11/05/2017   ETH <10 10/23/2017    Metabolic Disorder Labs:  Lab Results  Component Value Date   HGBA1C 5.2 06/21/2016   MPG 100 04/09/2012   Lab Results  Component Value Date   PROLACTIN 21.0 (H) 06/21/2016   Lab Results  Component Value Date   CHOL 152 11/07/2017   TRIG 142 11/07/2017   HDL 40 (L) 11/07/2017   CHOLHDL 3.8 11/07/2017   VLDL 28 11/07/2017   LDLCALC 84 11/07/2017   LDLCALC 88 06/21/2016    Physical Findings: AIMS: Facial and Oral Movements Muscles of Facial Expression: None, normal Lips and Perioral Area: None, normal Jaw: None, normal Tongue: None, normal,Extremity Movements Upper (arms, wrists, hands, fingers): None, normal Lower (legs, knees, ankles, toes): None, normal, Trunk Movements Neck, shoulders, hips: None, normal, Overall Severity Severity of abnormal movements (highest score from questions above): None, normal Incapacitation due to abnormal movements: None, normal Patient's awareness of abnormal movements (rate only patient's report): No Awareness, Dental Status Current problems with teeth and/or dentures?: No Does patient usually wear dentures?: No  CIWA:  CIWA-Ar Total: 2 COWS:  COWS Total Score: 0  Musculoskeletal: Strength & Muscle Tone: within normal limits Gait & Station: normal Patient leans: N/A  Psychiatric Specialty Exam: Physical Exam  Nursing note and vitals reviewed.   Review of Systems  All other systems reviewed and are negative.   Blood pressure 116/75, pulse 67, temperature 97.8 F (36.6 C), temperature source Oral, resp. rate 18,  height 5\' 10"  (1.778 m), weight 76.2 kg (168 lb), SpO2 98 %.Body mass index is 24.11 kg/m.  General Appearance: Casual  Eye Contact:  Good  Speech:  Clear and Coherent  Volume:  Normal  Mood:  Euthymic  Affect:  Congruent  Thought Process:  Goal Directed  Orientation:  Full (Time, Place, and Person)  Thought Content:  Negative  Suicidal Thoughts:  No  Homicidal Thoughts:  No  Memory:  Recent;   Fair  Judgement:  Fair  Insight:  Fair  Psychomotor Activity:  Normal  Concentration:  Concentration: Fair  Recall:  [employer] of Knowledge:  Fair  Language:  Fair  Akathisia:  No      Assets:  Communication Skills Desire for Improvement Resilience  ADL's:  Intact  Cognition:  WNL  Sleep:  Number of Hours: 6.15     Treatment Plan Summary: 51 yo male admitted due to worsening depression and Suicide attempt by ingesting mouthwash and Remeron while intoxicated. Pt has long history of alcohol and drug abuse. Alcohol and drug use appear to be a primary concern at this time and is much more impulsive while intoxicated. HE is still interested in residential treatment and CSW was able to get him an intake next week [facility]. He plans to attend some meetings in the meantime. Pt is glad to be back on medications and feels much more stable.   Plan:  MDD -Continue Abilify 5 mg daily, Wellbutrin XL 150 mg daily  Nightmares -Increase Prazosin to 3 mg qhs. He was on 5 mg in the past  Insomnia -Continue trazodone  HTN -Continue atenolol and Lisinopril  Dispo -He lives in boarding house which he will be returning to on discharge. He will follow up with [facility] next week for residential treatment. He will be given 7 day supply of medications from our pharmacy and then 30 days prescription to take to [facility]. Discharge tomorrow.    Haskell Riling, MD 11/12/2017, 8:14 AM

## 2017-11-12 NOTE — Plan of Care (Signed)
Patient slept for Estimated Hours of 6.15; Precautionary checks every 15 minutes for safety maintained, room free of safety hazards, patient sustains no injury or falls during this shift.  

## 2017-11-12 NOTE — Progress Notes (Signed)
Patient ID: Chase Moss, male   DOB: 05/29/1966, 51 y.o.   MRN: [id number] A&Ox3, c/o 8/10 bilateral great toes, Tylenol 650 mg PO PRN given, "someone stepped on my toes while out in the Courtyard playing basketball. Can you get me a nail clipper?" Attention seeking, safety concerns regarding nail clippers provided & discussed; attended the wrap up group, denied SI/HI/SIB, complied with bedtime medications.

## 2017-11-12 NOTE — Progress Notes (Signed)
Received Chase Moss this am after his breakfast, he was compliant with his medications.He denied feeling suicidal and hearing voices. He rated his anxiety 8/10 and depression 5/10. He has been OOB in the milieu most of the day, attending the group therapy sessions and socializing with his peers.

## 2017-11-13 MED ORDER — PRAZOSIN HCL 2 MG PO CAPS: 2.0000 mg | ORAL_CAPSULE | Freq: Every day | ORAL | 0 refills | Status: DC

## 2017-11-13 MED ORDER — ATENOLOL 50 MG PO TABS: 50.0000 mg | ORAL_TABLET | Freq: Every day | ORAL | 0 refills | Status: DC

## 2017-11-13 MED ORDER — PANTOPRAZOLE SODIUM 40 MG PO TBEC: 40.0000 mg | DELAYED_RELEASE_TABLET | Freq: Every day | ORAL | 0 refills | Status: DC

## 2017-11-13 MED ORDER — PRAVASTATIN SODIUM 40 MG PO TABS: 40.0000 mg | ORAL_TABLET | Freq: Every evening | ORAL | 0 refills | Status: DC

## 2017-11-13 MED ORDER — LISINOPRIL 5 MG PO TABS: 5.0000 mg | ORAL_TABLET | Freq: Every day | ORAL | 0 refills | Status: DC

## 2017-11-13 MED ORDER — ARIPIPRAZOLE 5 MG PO TABS: 5.0000 mg | ORAL_TABLET | Freq: Every day | ORAL | 0 refills | Status: DC

## 2017-11-13 MED ORDER — AMLODIPINE BESYLATE 10 MG PO TABS: 10.0000 mg | ORAL_TABLET | Freq: Every day | ORAL | 0 refills | Status: DC

## 2017-11-13 MED ORDER — BUPROPION HCL ER (XL) 150 MG PO TB24: 150.0000 mg | ORAL_TABLET | Freq: Every day | ORAL | 0 refills | Status: DC

## 2017-11-13 MED ORDER — TRAZODONE HCL 100 MG PO TABS: 200.0000 mg | ORAL_TABLET | Freq: Every day | ORAL | 0 refills | Status: DC

## 2017-11-13 NOTE — Plan of Care (Signed)
Pt. 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 Pt. Reports eating, "good". Pt. Denies all psych symptoms at this time to this writer, but at times appears to present slightly anxious and depressed. Pt. Specifically denies SI/HI and denies AVH.

## 2017-11-13 NOTE — Discharge Instructions (Signed)
You will be getting Prazosin 5 mg tablets from our pharmacy for 7 days. Break these in half and take 2.5 mg at bedtime for nightmares. This is to avoid drop in blood pressure. You have been tolerating 3 mg during hospitalization. Your new prescription will be for 2 mg at bedtime.

## 2017-11-13 NOTE — Discharge Summary (Signed)
Patient discharged from unit. Patient educated on discharged instructions and verbalized understanding on how and when to take medications. Patient acknowledge  [facility] Patient given belongings in locker as well as belongings within the safe in a white envelope. Patient given bus tickets and shuttled to the bus stop via [employer]. Patient D/C'd home.

## 2017-11-13 NOTE — Plan of Care (Signed)
Patient slept for Estimated Hours of 6.30; Precautionary checks every 15 minutes for safety maintained, room free of safety hazards, patient sustains no injury or falls during this shift.  

## 2017-11-13 NOTE — [facility] Group Notes (Signed)
  [facility] LCSW Group Therapy Note  Date/Time: 11/13/17, 0930  Type of Therapy/Topic:  Group Therapy:  Emotion Regulation  Participation Level:  Active   Mood:pleasant  Description of Group:    The purpose of this group is to assist patients in learning to regulate negative emotions and experience positive emotions. Patients will be guided to discuss ways in which they have been vulnerable to their negative emotions. These vulnerabilities will be juxtaposed with experiences of positive emotions or situations, and patients challenged to use positive emotions to combat negative ones. Special emphasis will be placed on coping with negative emotions in conflict situations, and patients will process healthy conflict resolution skills.  Therapeutic Goals: 1. Patient will identify two positive emotions or experiences to reflect on in order to balance out negative emotions:  2. Patient will label two or more emotions that they find the most difficult to experience:  3. Patient will be able to demonstrate positive conflict resolution skills through discussion or role plays:   Summary of Patient Progress: Pt reported that anger can be a difficult emotion for him to experience.  Pt active participant in group discussion regarding positive ways to handle more negative emotions.       Therapeutic Modalities:   Cognitive Behavioral Therapy Feelings Identification Dialectical Behavioral Therapy  Daleen SquibbGreg Montrice Gracey, LCSW

## 2017-11-13 NOTE — Progress Notes (Signed)
D:Pt denies SI/HI/AVH. Pt. reports "I have pain in my toe" that was relieved with PRN medications.  Patient's mood upon interaction appears slightly depressed with some anxieties.  A: Q x 15 minute observation checks were completed for safety. Patient was provided with education. Patient was given scheduled/PRN medications. Patient  was encourage to attend groups, participate in unit activities and continue with plan of care.   R:Patient was complaint with medication and unit procedures.   Patient slept for Estimated Hours of 6.30; Precautionary checks every 15 minutes for safety maintained, room free of safety hazards, patient sustains no injury or falls during this shift.

## 2017-11-13 NOTE — Progress Notes (Signed)
  [facility] Discharge Plan :  Will you be returning to the same living situation after discharge:  Yes,    At discharge, do you have transportation home?: Yes,    Do you have the ability to pay for your medications: Yes,     Release of information consent forms completed and in the chart;  Patient's signature needed at discharge.  Patient to Follow up at: Follow-up Information    [facility]. Go on 11/19/2017.   Why:  7:45am, Please bring your ID, 30 day prescriptions in hand as well as 1 refill available OR prescription for refills.  Please bring sweatshirt/coats, Do not bring money, or sheets/blankets.  Call if you have further questions about what you may need. Contact information: [address] [place] [address] [phone]           Next level of care provider [facility]:no  Safety Planning and Suicide Prevention discussed: Yes,     Have you used any form of tobacco in the last 30 days? (Cigarettes, Smokeless Tobacco, Cigars, and/or Pipes): Yes  Has patient been referred to the Quitline?: Patient refused referral  Patient has been referred for addiction treatment: Yes  Glennon MacSara P Doye Montilla, LCSW 11/13/2017, 3:09 PM

## 2017-11-13 NOTE — [facility] Suicide Risk Assessment (Signed)
[facility]   Principal Problem: Major depressive disorder, recurrent, severe with psychotic features [facility](HCC) Discharge Diagnoses:  Patient Active Problem List   Diagnosis Date Noted  . Intentional overdose of drug in tablet form (HCC) [T50.902A] 11/08/2017  . Dyslipidemia [E78.5] 11/08/2017  . Constipation [K59.00] 11/08/2017  . Drug overdose [T50.901A] 11/05/2017  . Acute alcoholic intoxication without complication (HCC) [F10.929]   . Suicidal ideation [R45.851]   . Alcohol intoxication (HCC) [F10.929] 10/22/2017  . Alcohol withdrawal (HCC) [F10.239] 10/22/2017  . Polysubstance abuse (HCC) [F19.10]   . 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 Cocaine use disorder, moderate, dependence (HCC) [F14.20] 03/31/2017  . HTN (hypertension) [I10] 06/20/2016  . MDD (major depressive disorder), recurrent severe, without psychosis (HCC) [F33.2] 06/20/2016  . Tobacco use disorder [F17.200] 06/20/2016  . Trauma [T14.90XA] 07/27/2015  . Cannabis use disorder, moderate, dependence (HCC) [F12.20]   . PTSD (post-traumatic stress disorder) [F43.10]   . Substance induced mood disorder (HCC) [F19.94] 07/07/2014  . Alcohol use disorder, moderate, dependence (HCC) [F10.20] 04/10/2012  . Suicidal thoughts [R45.851] 04/04/2012    Total Time spent with patient: 20 minutes  Musculoskeletal: Strength & Muscle Tone: within normal limits Gait & Station: normal Patient leans: N/A  Psychiatric Specialty Exam: ROS  Blood pressure 103/76, pulse 63, temperature 98 F (36.7 C), resp. rate 18, height 5\' 10"  (1.778 m), weight 76.2 kg (168 lb), SpO2 98 %.Body mass index is 24.11 kg/m.   Mental Status Per Nursing Assessment::   On Admission:  NA  Demographic Factors:  Male, Caucasian, Living alone and Unemployed  Loss Factors: NA  Historical Factors: Prior suicide attempts and Impulsivity  Risk Reduction Factors:    Positive social support, Positive therapeutic relationship and Positive coping skills or problem solving skills  Continued Clinical Symptoms:  None  Cognitive Features That Contribute To Risk:  None    Suicide Risk:  Minimal: No identifiable suicidal ideation.  Patients presenting with no risk factors but with morbid ruminations; may be classified as minimal risk based on the severity of the depressive symptoms  Follow-up Information    [facility]. Go on 11/19/2017.   Why:  7:45am, Please bring your ID, 30 day prescriptions in hand as well as 1 refill available OR prescription for refills.  Please bring sweatshirt/coats, Do not bring money, or sheets/blankets.  Call if you have further questions about what you may need. Contact information: [place] [address] [place] [address] [phone]           Plan Of Care/Follow-up recommendations:  See above Haskell RilingHolly R McNew, MD 11/13/2017, 10:43 AM

## 2017-11-13 NOTE — Discharge Summary (Signed)
Physician Discharge Summary Note  Patient:  Chase Moss is an 51 y.o., male MRN:  [id number] DOB:  01/19/1966 Patient phone:  [phone] (home)  Patient address:   [address] [place] [address],  Total Time spent with patient: 20 minutes  Plus 20 minutes of medication reconciliation, discharge planning, and discahrge documentation   Date of Admission:  11/08/2017 Date of Discharge: 11/13/17  Reason for Admission:  Suicide attempt  Principal Problem: Major depressive disorder, recurrent, severe with psychotic features [facility](HCC) Discharge Diagnoses: Patient Active Problem List   Diagnosis Date Noted  . Intentional overdose of drug in tablet form (HCC) [T50.902A] 11/08/2017  . Dyslipidemia [E78.5] 11/08/2017  . Constipation [K59.00] 11/08/2017  . Drug overdose [T50.901A] 11/05/2017  . Acute alcoholic intoxication without complication (HCC) [F10.929]   . Suicidal ideation [R45.851]   . Alcohol intoxication (HCC) [F10.929] 10/22/2017  . Alcohol withdrawal (HCC) [F10.239] 10/22/2017  . Polysubstance abuse (HCC) [F19.10]   . 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 Cocaine use disorder, moderate, dependence (HCC) [F14.20] 03/31/2017  . HTN (hypertension) [I10] 06/20/2016  . MDD (major depressive disorder), recurrent severe, without psychosis (HCC) [F33.2] 06/20/2016  . Tobacco use disorder [F17.200] 06/20/2016  . Trauma [T14.90XA] 07/27/2015  . Cannabis use disorder, moderate, dependence (HCC) [F12.20]   . PTSD (post-traumatic stress disorder) [F43.10]   . Substance induced mood disorder (HCC) [F19.94] 07/07/2014  . Alcohol use disorder, moderate, dependence (HCC) [F10.20] 04/10/2012  . Suicidal thoughts [R45.851] 04/04/2012    Past Psychiatric History: See H&P  Past Medical History:  Past Medical History:  Diagnosis Date  . Bipolar 1 disorder (HCC)   . Depression   . Hypertension   . PTSD (post-traumatic stress  disorder)    History reviewed. No pertinent surgical history. Family History:  Family History  Problem Relation Age of Onset  . Heart attack Other    Family Psychiatric  History: See H&P Social History:  Social History   Substance and Sexual Activity  Alcohol Use Yes  . Alcohol/week: 1.2 oz  . Types: 2 Cans of beer per week   Comment: daily alcohol     Social History   Substance and Sexual Activity  Drug Use Yes  . Types: Cocaine, Marijuana   Comment: several times a day- pt reports hasn't used since detoxic     Social History   Socioeconomic History  . Marital status: Divorced    Spouse name: None  . Number of children: None  . Years of education: None  . Highest education level: None  Social Needs  . Financial resource strain: None  . Food insecurity - worry: None  . Food insecurity - inability: None  . Transportation needs - medical: None  . Transportation needs - non-medical: None  Occupational History  . None  Tobacco Use  . Smoking status: Current Every Day Smoker    Packs/day: 1.00    Years: 30.00    Pack years: 30.00    Types: Cigarettes  . Smokeless tobacco: Never Used  Substance and Sexual Activity  . Alcohol use: Yes    Alcohol/week: 1.2 oz    Types: 2 Cans of beer per week    Comment: daily alcohol  . Drug use: Yes    Types: Cocaine, Marijuana    Comment: several times a day- pt reports hasn't used since detoxic   . Sexual activity: Yes  Other Topics Concern  . None  Social History Narrative  . None  Hospital Course:  Pt was admitted after overdosing on Remeron while intoxicated on mouthwash (he was drinking mouthwash to get intoxicated as he was out of beer). Pt was restarted on his home medications of Wellbutrin, Abilify, Prazosin, and trazodone. Prazosin was started at 1 mg since he had been off it. This was titrated up to 3 mg (he was previously on 5 mg). At 3 mg, his BP was lower than normal. He felt the nightmares were much improved  while on 2 mg so decided to decrease to 2 mg at bedtime. Pt was active in the milu and at groups. ON day of discharge, pt reported feeling better. When asked, denied any thoughts of suicide or self harm. Denied any homicidal or thoughts of harming others. He was bright and cheerful. He plans to spend Thanksgiving with his brother in law. He is looking forward to [facility] treatment on Tuesday and his landlord is going to take him to that appointment. He has been sleeping and eating well on the unit.   The patient is at low risk of imminent suicide. Patient denied thoughts, intent, or plan for harm to self or others, expressed significant future orientation, and expressed an ability to mobilize assistance for his needs. he is presently void of any contributing psychiatric symptoms, cognitive difficulties, or substance use which would elevate his risk for lethality. Chronic risk for lethality is elevated in light of substance abuse and impulsivity. The chronic risk is presently mitigated by his ongoing desire and engagement in [facility] treatment and mobilization of support from family and friends. Chronic risk may elevate if he experiences any significant loss or worsening of symptoms, which can be managed and monitored through outpatient providers. At this time,a cute risk for lethality is low and he is stable for ongoing outpatient management.   Modifiable risk factors were addressed during this hospitalization through appropriate pharmacotherapy and establishment of outpatient follow-up treatment. Some risk factors for suicide are situational (i.e. Unstable housing) or related personality pathology (i.e. Poor coping mechanisms) and thus cannot be further mitigated by continued hospitalization in this setting.    Physical Findings: AIMS: Facial and Oral Movements Muscles of Facial Expression: None, normal Lips and Perioral Area: None, normal Jaw: None, normal Tongue: None, normal,Extremity Movements Upper  (arms, wrists, hands, fingers): None, normal Lower (legs, knees, ankles, toes): None, normal, Trunk Movements Neck, shoulders, hips: None, normal, Overall Severity Severity of abnormal movements (highest score from questions above): None, normal Incapacitation due to abnormal movements: None, normal Patient's awareness of abnormal movements (rate only patient's report): No Awareness, Dental Status Current problems with teeth and/or dentures?: No Does patient usually wear dentures?: No  CIWA:  CIWA-Ar Total: 2 COWS:  COWS Total Score: 0  Musculoskeletal: Strength & Muscle Tone: within normal limits Gait & Station: normal Patient leans: N/A  Psychiatric Specialty Exam: Physical Exam  ROS  Blood pressure 103/76, pulse 63, temperature 98 F (36.7 C), resp. rate 18, height 5\' 10"  (1.778 m), weight 76.2 kg (168 lb), SpO2 98 %.Body mass index is 24.11 kg/m.     Have you used any form of tobacco in the last 30 days? (Cigarettes, Smokeless Tobacco, Cigars, and/or Pipes): Yes  Has this patient used any form of tobacco in the last 30 days? (Cigarettes, Smokeless Tobacco, Cigars, and/or Pipes) Yes, Yes, A prescription for an FDA-approved tobacco cessation medication was offered at discharge and the patient refused  Blood Alcohol level:  Lab Results  Component Value Date   ETH 190 (H)  11/05/2017   ETH <10 10/23/2017    Metabolic Disorder Labs:  Lab Results  Component Value Date   HGBA1C 5.2 06/21/2016   MPG 100 04/09/2012   Lab Results  Component Value Date   PROLACTIN 21.0 (H) 06/21/2016   Lab Results  Component Value Date   CHOL 152 11/07/2017   TRIG 142 11/07/2017   HDL 40 (L) 11/07/2017   CHOLHDL 3.8 11/07/2017   VLDL 28 11/07/2017   LDLCALC 84 11/07/2017   LDLCALC 88 06/21/2016    See Psychiatric Specialty Exam and Suicide Risk Assessment completed by Attending Physician prior to discharge.  Discharge destination:  Home  Is patient on multiple antipsychotic  therapies at discharge:  No   Has Patient had three or more failed trials of antipsychotic monotherapy by history:  No  Recommended Plan for Multiple Antipsychotic Therapies: NA  Discharge Instructions    Diet - low sodium heart healthy   Complete by:  As directed    Increase activity slowly   Complete by:  As directed      Allergies as of 11/13/2017   No Known Allergies     Medication List    STOP taking these medications   hydrOXYzine 25 MG tablet Commonly known as:  ATARAX/VISTARIL   polyethylene glycol packet Commonly known as:  MIRALAX / GLYCOLAX   senna-docusate 8.6-50 MG tablet Commonly known as:  Senokot-S     TAKE these medications     Indication  amLODipine 10 MG tablet Commonly known as:  NORVASC Take 1 tablet (10 mg total) by mouth daily.  Indication:  High Blood Pressure Disorder   ARIPiprazole 5 MG tablet Commonly known as:  ABILIFY Take 1 tablet (5 mg total) by mouth daily. For mood control  Indication:  Mood control   atenolol 50 MG tablet Commonly known as:  TENORMIN Take 1 tablet (50 mg total) by mouth daily. What changed:  additional instructions  Indication:  High Blood Pressure Disorder   buPROPion 150 MG 24 hr tablet Commonly known as:  WELLBUTRIN XL Take 1 tablet (150 mg total) by mouth daily. For depression  Indication:  Mood control   folic acid 1 MG tablet Commonly known as:  FOLVITE Take 1 tablet (1 mg total) daily by mouth.  Indication:  Anemia From Inadequate Folic Acid   lisinopril 5 MG tablet Commonly known as:  PRINIVIL,ZESTRIL Take 1 tablet (5 mg total) by mouth daily.  Indication:  High Blood Pressure Disorder   multivitamin with minerals Tabs tablet Take 1 tablet daily by mouth.  Indication:  vitamine def   pantoprazole 40 MG tablet Commonly known as:  PROTONIX Take 1 tablet (40 mg total) by mouth daily. Start taking on:  11/14/2017 What changed:  additional instructions  Indication:  Gastroesophageal Reflux  Disease   pravastatin 40 MG tablet Commonly known as:  PRAVACHOL Take 1 tablet (40 mg total) by mouth every evening. What changed:  additional instructions  Indication:  Inherited Heterozygous Hypercholesterolemia, High Amount of Fats in the Blood   prazosin 2 MG capsule Commonly known as:  MINIPRESS Take 1 capsule (2 mg total) by mouth at bedtime. For nightmares What changed:    medication strength  how much to take  Indication:  PTSD related nightmares   thiamine 100 MG tablet Take 1 tablet (100 mg total) daily by mouth.  Indication:  alcohol use disorder   traZODone 100 MG tablet Commonly known as:  DESYREL Take 2 tablets (200 mg total) by mouth at  bedtime.  Indication:  Trouble Sleeping      Follow-up Information    [facility]. Go on 11/19/2017.   Why:  7:45am, Please bring your ID, 30 day prescriptions in hand as well as 1 refill available OR prescription for refills.  Please bring sweatshirt/coats, Do not bring money, or sheets/blankets.  Call if you have further questions about what you may need. Contact information: [place] [address] [place] [address] [phone]           Follow-up recommendations:  [facility] on Tuesday Signed: Haskell RilingHolly R Cowan Pilar, MD 11/13/2017, 10:43 AM

## 2017-11-13 NOTE — Progress Notes (Signed)
Recreation Therapy Notes  INPATIENT RECREATION TR PLAN  Patient Details Name: Chase Moss MRN: [id number] DOB: Jun 02, 1966 Today's Date: 11/13/2017  Rec Therapy Plan Is patient appropriate for Therapeutic Recreation?: Yes Treatment times per week: at least 3 Estimated Length of Stay: 5-7 days TR Treatment/Interventions: Group participation (Comment)(Appropriate participation in recreation therapy tx.)  Discharge Criteria Pt will be discharged from therapy if:: Discharged Treatment plan/goals/alternatives discussed and agreed upon by:: Patient/family  Discharge Summary Short term goals set: Patient will identify 3 positive coping strategies to use post discharge x5 days.  Short term goals met: Not met Progress toward goals comments: (None) Reason goals not met: Patient did not attend any groups. Therapeutic equipment acquired: N/A Reason patient discharged from therapy: Discharge from hospital Pt/family agrees with progress & goals achieved: Yes Date patient discharged from therapy: 11/13/17   Treena Cosman 11/13/2017, 2:47 PM

## 2017-11-13 NOTE — Plan of Care (Signed)
Patient denies SI, HI, and AVH. Patient able to identify coping strategies and verbalize feelings.  Patient educated on medication and is med compliant while on the unit. Patient attends group meetings and is appropriate to staff and other peers. Patient is pleasant on the unit.

## 2017-12-11 ENCOUNTER — Encounter (HOSPITAL_COMMUNITY): Payer: Self-pay

## 2017-12-11 ENCOUNTER — Inpatient Hospital Stay (HOSPITAL_COMMUNITY)
Admission: AD | Admit: 2017-12-11 | Discharge: 2017-12-17 | DRG: 885 | Disposition: A | Discharge status: Home / Self Care | Payer: Medicaid Other | Source: Intra-hospital | Attending: Psychiatry | Admitting: Psychiatry

## 2017-12-11 ENCOUNTER — Other Ambulatory Visit: Payer: Self-pay

## 2017-12-11 ENCOUNTER — Emergency Department (HOSPITAL_COMMUNITY)
Admission: EM | Admit: 2017-12-11 | Discharge: 2017-12-11 | Disposition: A | Discharge status: Other Acute Inpatient Hospital | Payer: Medicaid Other | Attending: Emergency Medicine | Admitting: Emergency Medicine

## 2017-12-11 DIAGNOSIS — Y999 Unspecified external cause status: Secondary | ICD-10-CM | POA: Insufficient documentation

## 2017-12-11 DIAGNOSIS — T1491XA Suicide attempt, initial encounter: Secondary | ICD-10-CM | POA: Insufficient documentation

## 2017-12-11 DIAGNOSIS — R45851 Suicidal ideations: Secondary | ICD-10-CM | POA: Insufficient documentation

## 2017-12-11 DIAGNOSIS — Z23 Encounter for immunization: Secondary | ICD-10-CM

## 2017-12-11 DIAGNOSIS — F10120 Alcohol abuse with intoxication, uncomplicated: Secondary | ICD-10-CM | POA: Diagnosis present

## 2017-12-11 DIAGNOSIS — F17210 Nicotine dependence, cigarettes, uncomplicated: Secondary | ICD-10-CM | POA: Diagnosis not present

## 2017-12-11 DIAGNOSIS — Z79891 Long term (current) use of opiate analgesic: Secondary | ICD-10-CM | POA: Diagnosis not present

## 2017-12-11 DIAGNOSIS — Z634 Disappearance and death of family member: Secondary | ICD-10-CM

## 2017-12-11 DIAGNOSIS — Z716 Tobacco abuse counseling: Secondary | ICD-10-CM | POA: Diagnosis not present

## 2017-12-11 DIAGNOSIS — I10 Essential (primary) hypertension: Secondary | ICD-10-CM | POA: Diagnosis present

## 2017-12-11 DIAGNOSIS — F1410 Cocaine abuse, uncomplicated: Secondary | ICD-10-CM | POA: Diagnosis not present

## 2017-12-11 DIAGNOSIS — Y929 Unspecified place or not applicable: Secondary | ICD-10-CM | POA: Insufficient documentation

## 2017-12-11 DIAGNOSIS — Z915 Personal history of self-harm: Secondary | ICD-10-CM | POA: Diagnosis not present

## 2017-12-11 DIAGNOSIS — R45850 Homicidal ideations: Secondary | ICD-10-CM | POA: Insufficient documentation

## 2017-12-11 DIAGNOSIS — Z560 Unemployment, unspecified: Secondary | ICD-10-CM | POA: Diagnosis not present

## 2017-12-11 DIAGNOSIS — F39 Unspecified mood [affective] disorder: Secondary | ICD-10-CM | POA: Diagnosis not present

## 2017-12-11 DIAGNOSIS — F419 Anxiety disorder, unspecified: Secondary | ICD-10-CM | POA: Diagnosis not present

## 2017-12-11 DIAGNOSIS — Z79899 Other long term (current) drug therapy: Secondary | ICD-10-CM | POA: Diagnosis not present

## 2017-12-11 DIAGNOSIS — F1420 Cocaine dependence, uncomplicated: Secondary | ICD-10-CM | POA: Diagnosis present

## 2017-12-11 DIAGNOSIS — F332 Major depressive disorder, recurrent severe without psychotic features: Principal | ICD-10-CM | POA: Diagnosis present

## 2017-12-11 DIAGNOSIS — F329 Major depressive disorder, single episode, unspecified: Secondary | ICD-10-CM | POA: Diagnosis present

## 2017-12-11 DIAGNOSIS — F1210 Cannabis abuse, uncomplicated: Secondary | ICD-10-CM | POA: Diagnosis not present

## 2017-12-11 DIAGNOSIS — F4310 Post-traumatic stress disorder, unspecified: Secondary | ICD-10-CM | POA: Diagnosis present

## 2017-12-11 DIAGNOSIS — T50902D Poisoning by unspecified drugs, medicaments and biological substances, intentional self-harm, subsequent encounter: Secondary | ICD-10-CM

## 2017-12-11 DIAGNOSIS — Z046 Encounter for general psychiatric examination, requested by authority: Secondary | ICD-10-CM | POA: Insufficient documentation

## 2017-12-11 DIAGNOSIS — G4700 Insomnia, unspecified: Secondary | ICD-10-CM | POA: Diagnosis not present

## 2017-12-11 DIAGNOSIS — Y9389 Activity, other specified: Secondary | ICD-10-CM | POA: Insufficient documentation

## 2017-12-11 DIAGNOSIS — T50902A Poisoning by unspecified drugs, medicaments and biological substances, intentional self-harm, initial encounter: Secondary | ICD-10-CM | POA: Insufficient documentation

## 2017-12-11 DIAGNOSIS — X838XXA Intentional self-harm by other specified means, initial encounter: Secondary | ICD-10-CM | POA: Insufficient documentation

## 2017-12-11 DIAGNOSIS — F1220 Cannabis dependence, uncomplicated: Secondary | ICD-10-CM | POA: Diagnosis present

## 2017-12-11 DIAGNOSIS — F1910 Other psychoactive substance abuse, uncomplicated: Secondary | ICD-10-CM | POA: Diagnosis not present

## 2017-12-11 DIAGNOSIS — Z813 Family history of other psychoactive substance abuse and dependence: Secondary | ICD-10-CM | POA: Diagnosis not present

## 2017-12-11 LAB — URINALYSIS, ROUTINE W REFLEX MICROSCOPIC
Bacteria, UA: NONE SEEN
Bilirubin Urine: NEGATIVE
GLUCOSE, UA: NEGATIVE mg/dL
Ketones, ur: NEGATIVE mg/dL
Leukocytes, UA: NEGATIVE
NITRITE: NEGATIVE
PH: 5 (ref 5.0–8.0)
PROTEIN: NEGATIVE mg/dL
RBC / HPF: NONE SEEN RBC/hpf (ref 0–5)
SPECIFIC GRAVITY, URINE: 1.009 (ref 1.005–1.030)

## 2017-12-11 LAB — RAPID URINE DRUG SCREEN, HOSP PERFORMED
AMPHETAMINES: NOT DETECTED
BARBITURATES: NOT DETECTED
BENZODIAZEPINES: NOT DETECTED
Cocaine: NOT DETECTED
Opiates: NOT DETECTED
TETRAHYDROCANNABINOL: POSITIVE — AB

## 2017-12-11 LAB — COMPREHENSIVE METABOLIC PANEL
ALBUMIN: 4.1 g/dL (ref 3.5–5.0)
ALT: 19 U/L (ref 17–63)
AST: 22 U/L (ref 15–41)
Alkaline Phosphatase: 71 U/L (ref 38–126)
Anion gap: 8 (ref 5–15)
BUN: 21 mg/dL — AB (ref 6–20)
CHLORIDE: 107 mmol/L (ref 101–111)
CO2: 24 mmol/L (ref 22–32)
CREATININE: 1.13 mg/dL (ref 0.61–1.24)
Calcium: 9.1 mg/dL (ref 8.9–10.3)
GFR calc Af Amer: >60 mL/min (ref >60)
GLUCOSE: 112 mg/dL — AB (ref 65–99)
Potassium: 3.2 mmol/L — ABNORMAL LOW (ref 3.5–5.1)
Sodium: 139 mmol/L (ref 135–145)
Total Bilirubin: 0.5 mg/dL (ref 0.3–1.2)
Total Protein: 7 g/dL (ref 6.5–8.1)

## 2017-12-11 LAB — CBC
HEMATOCRIT: 44.7 % (ref 39.0–52.0)
HEMOGLOBIN: 15.2 g/dL (ref 13.0–17.0)
MCH: 30.8 pg (ref 26.0–34.0)
MCHC: 34 g/dL (ref 30.0–36.0)
MCV: 90.5 fL (ref 78.0–100.0)
PLATELETS: 185 10*3/uL (ref 150–400)
RBC: 4.94 MIL/uL (ref 4.22–5.81)
RDW: 13.1 % (ref 11.5–15.5)
WBC: 7.1 10*3/uL (ref 4.0–10.5)

## 2017-12-11 LAB — ACETAMINOPHEN LEVEL
Acetaminophen (Tylenol), Serum: <10 ug/mL — ABNORMAL LOW (ref 10–30)
Acetaminophen (Tylenol), Serum: <10 ug/mL — ABNORMAL LOW (ref 10–30)

## 2017-12-11 LAB — ETHANOL: Alcohol, Ethyl (B): <10 mg/dL (ref <10)

## 2017-12-11 LAB — SALICYLATE LEVEL: Salicylate Lvl: <7 mg/dL (ref 2.8–30.0)

## 2017-12-11 MED ORDER — LISINOPRIL 5 MG PO TABS
5.0000 mg | ORAL_TABLET | Freq: Every day | ORAL | Status: DC
  Administered 2017-12-12 – 2017-12-17 (×6): 5 mg via ORAL
  Filled 2017-12-11 (×10): qty 1

## 2017-12-11 MED ORDER — INFLUENZA VAC SPLIT QUAD 0.5 ML IM SUSY
0.5000 mL | PREFILLED_SYRINGE | INTRAMUSCULAR | Status: AC
  Administered 2017-12-12: 0.5 mL via INTRAMUSCULAR
  Filled 2017-12-11: qty 0.5

## 2017-12-11 MED ORDER — ALUM & MAG HYDROXIDE-SIMETH 200-200-20 MG/5ML PO SUSP: 30.0000 mL | ORAL | Status: DC | PRN

## 2017-12-11 MED ORDER — TRAZODONE HCL 50 MG PO TABS
50.0000 mg | ORAL_TABLET | Freq: Every evening | ORAL | Status: DC | PRN
  Administered 2017-12-12: 50 mg via ORAL
  Filled 2017-12-11 (×2): qty 1

## 2017-12-11 MED ORDER — POTASSIUM CHLORIDE CRYS ER 20 MEQ PO TBCR
20.0000 meq | EXTENDED_RELEASE_TABLET | Freq: Two times a day (BID) | ORAL | Status: AC
  Administered 2017-12-11 – 2017-12-13 (×4): 20 meq via ORAL
  Filled 2017-12-11 (×5): qty 1

## 2017-12-11 MED ORDER — ACETAMINOPHEN 325 MG PO TABS
650.0000 mg | ORAL_TABLET | Freq: Four times a day (QID) | ORAL | Status: DC | PRN
Start: 2017-12-11 — End: 2017-12-17

## 2017-12-11 MED ORDER — MAGNESIUM HYDROXIDE 400 MG/5ML PO SUSP: 30.0000 mL | Freq: Every day | ORAL | Status: DC | PRN

## 2017-12-11 MED ORDER — HYDROXYZINE HCL 25 MG PO TABS
25.0000 mg | ORAL_TABLET | Freq: Three times a day (TID) | ORAL | Status: DC | PRN
  Administered 2017-12-12 – 2017-12-16 (×4): 25 mg via ORAL
  Filled 2017-12-11 (×6): qty 1
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
## 2017-12-11 NOTE — Tx Team (Signed)
Initial Treatment Plan 12/11/2017 5:18 PM Chase DowGregory Stringfield FAO:[id number]    PATIENT STRESSORS: Financial difficulties Medication change or noncompliance Substance abuse   PATIENT STRENGTHS: Communication skills General fund of knowledge Physical Health   PATIENT IDENTIFIED PROBLEMS: Depression  Suicidal ideation  Substance abuse  "Get back on my medicines"  "Get better so I won't think about doing something stupid everyday"             DISCHARGE CRITERIA:  Improved stabilization in mood, thinking, and/or behavior Verbal commitment to aftercare and medication compliance  PRELIMINARY DISCHARGE PLAN: Outpatient therapy Medication management  PATIENT/FAMILY INVOLVEMENT: This treatment plan has been presented to and reviewed with the patient, Chase Moss.  The patient and family have been given the opportunity to ask questions and make suggestions.  Levin BaconHeather V Byron Tipping, RN 12/11/2017, 5:18 PM

## 2017-12-11 NOTE — ED Notes (Signed)
Per Poison Control Chase Moss(Chase Moss) patient is medically cleared for physician preferred disposition.

## 2017-12-11 NOTE — ED Notes (Signed)
Bed: RESB Expected date:  Expected time:  Means of arrival:  Comments: Overdose on trazodone and risperdal
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
## 2017-12-11 NOTE — ED Notes (Signed)
Pt has TTS in the room.
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
## 2017-12-11 NOTE — ED Notes (Signed)
On admission to the [facility] pt is calm and cooperative. He reports being sad. He asked for drinks and snacks. He was oriented to the unit and given snacks.

## 2017-12-11 NOTE — ED Notes (Signed)
Pt transported to [facility] by [facility]. All belongings returned to pt who signed for same. Pt was calm and cooperative.  

## 2017-12-11 NOTE — ED Notes (Signed)
Pt is alert and oriented x 4 and is verbally responsive. Pt is calm and cooperative.

## 2017-12-11 NOTE — ED Notes (Addendum)
Poison Control suggested a EKG, 4 hr acetaminophen level and 4-6 hr observation

## 2017-12-11 NOTE — BH Assessment (Addendum)
[facility] Assessment Progress Note  [facility]. Accepting provider is Hillery Jacksanika Lewis, NP. Attending provider is Dr. Jackquline BerlinIzediuno. Call report to [phone]. [facility]. Originals given to pt's RN, Diane.   Johny ShockSamantha M. Ladona Ridgelaylor, MS, NCC, LPCA Counselor

## 2017-12-11 NOTE — ED Notes (Signed)
Bed: WA27 Expected date:  Expected time:  Means of arrival:  Comments: 

## 2017-12-11 NOTE — ED Notes (Signed)
Pt took 14 trazodone tablets plus three from another bottle and remeron 15 mg  Pt is SI with overdose plans Pt is homicidal towards one person

## 2017-12-11 NOTE — Progress Notes (Signed)
Patient did not attend group.

## 2017-12-12 DIAGNOSIS — I10 Essential (primary) hypertension: Secondary | ICD-10-CM

## 2017-12-12 DIAGNOSIS — Z560 Unemployment, unspecified: Secondary | ICD-10-CM

## 2017-12-12 DIAGNOSIS — Z813 Family history of other psychoactive substance abuse and dependence: Secondary | ICD-10-CM

## 2017-12-12 MED ORDER — TRAZODONE HCL 100 MG PO TABS
100.0000 mg | ORAL_TABLET | Freq: Once | ORAL | Status: AC
  Administered 2017-12-12: 100 mg via ORAL
  Filled 2017-12-12 (×2): qty 1

## 2017-12-12 MED ORDER — BUPROPION HCL ER (XL) 150 MG PO TB24
150.0000 mg | ORAL_TABLET | Freq: Once | ORAL | Status: AC
  Administered 2017-12-12: 150 mg via ORAL
  Filled 2017-12-12 (×2): qty 1

## 2017-12-12 MED ORDER — BUPROPION HCL ER (XL) 300 MG PO TB24
300.0000 mg | ORAL_TABLET | Freq: Every day | ORAL | Status: DC
  Administered 2017-12-13 – 2017-12-15 (×3): 300 mg via ORAL
  Filled 2017-12-12 (×4): qty 1

## 2017-12-12 MED ORDER — CHLORPROMAZINE HCL 50 MG PO TABS
50.0000 mg | ORAL_TABLET | Freq: Every day | ORAL | Status: DC
  Administered 2017-12-12 – 2017-12-16 (×5): 50 mg via ORAL
  Filled 2017-12-12: qty 2
  Filled 2017-12-12 (×7): qty 1

## 2017-12-12 MED ORDER — MIRTAZAPINE 15 MG PO TABS
15.0000 mg | ORAL_TABLET | Freq: Every day | ORAL | Status: DC
  Administered 2017-12-12 – 2017-12-16 (×5): 15 mg via ORAL
  Filled 2017-12-12 (×8): qty 1
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
## 2017-12-12 NOTE — Plan of Care (Signed)
  Safety: Periods of time without injury will increase 12/12/2017 2311 - Progressing by Zyrell Carmean A, RN Note Pt safe on the unit this evening   Coping: Ability to cope will improve 12/12/2017 2311 - Progressing by Sylvanus Telford A, RN Note Pt seen on the unit interacting with peers   

## 2017-12-12 NOTE — H&P (Signed)
Psychiatric Admission Assessment Adult  Patient Identification: Chase Moss MRN:  [id number] Date of Evaluation:  12/12/2017 Chief Complaint:  Suicidal behavior and lingering  thoughts Principal Diagnosis: MDD Recurrent Diagnosis:   Patient Active Problem List   Diagnosis Date Noted  . MDD (major depressive disorder) [F32.9] 12/11/2017  . Intentional overdose of drug in tablet form ([place]) [T50.902A] 11/08/2017  . Dyslipidemia [E78.5] 11/08/2017  . Constipation [K59.00] 11/08/2017  . Drug overdose [T50.901A] 11/05/2017  . Acute alcoholic intoxication without complication ([place]) [X10.626]   . Suicidal ideation [R45.851]   . Alcohol intoxication ([place]) [F10.929] 10/22/2017  . Alcohol withdrawal ([place]) [F10.239] 10/22/2017  . Polysubstance abuse ([place]) [F19.10]   . Major depressive disorder, recurrent, severe with psychotic features ([place]) [F33.3] 05/15/2017  . Cocaine use disorder, moderate, dependence ([place]) [F14.20] 03/31/2017  . HTN (hypertension) [I10] 06/20/2016  . MDD (major depressive disorder), recurrent severe, without psychosis ([place]) [F33.2] 06/20/2016  . Tobacco use disorder [F17.200] 06/20/2016  . Trauma [T14.90XA] 07/27/2015  . Cannabis use disorder, moderate, dependence ([place]) [F12.20]   . PTSD (post-traumatic stress disorder) [F43.10]   . Substance induced mood disorder ([place]) [F19.94] 07/07/2014  . Alcohol use disorder, moderate, dependence ([place]) [F10.20] 04/10/2012  . Suicidal thoughts [R45.851] 04/04/2012   History of Present Illness:   51 y.o Caucasian male, divorced, lives at a [facility], on [address]. Background history of MDD, SUD and Bereavement. Presented to the ER via the ambulance. Called himself as he was having lingering suicidal thoughts. Patient had earlier overdosed on 1700 mg of Trazodone and 30 mg of Mirtazapine. He had expressed thoughts of getting drunk by the bridge. He had expressed thoughts of jumping off the bridge once he is intoxicated with alcohol. He  has been stressed by the holidays and upcoming anniversary of his sister who passed in January. Routine labs is significant for low potassium. Toxicology is negative,  UDS is positive for THC , BAL<10 mg/dl.  Patient is known to [place]. He was managed here in May following a suicidal attempt. He has had another admission at [facility] after he was discharged from here. At interview, he tells me that he has been feeling very down lately. Says he has not been motivated to do anything. All he does is stay in his room. Says he has been ruminating a lot on the death of his sister. Says after his mother passed in 2015, his sister stepped up into his mother's role. Says he used to go to her place for holidays. Patient says since she passed, holidays has become a very difficult time for him. Says he has been having recurrent nightmares of how his mother and sister were cremated. Says he has been getting angry at the drunk driver that killed his mom. Patient denies any homicidal thoughts towards him. Says he lives in [place]. Patient reports crying spells, poor appetite and energy. Says he has been praying that God would take his life away. Says one of his roommates was going to call the ambulance if he did not do it himself. Patient says he has been off medications as he was not sure it was helpful. Says he has stayed away from alcohol for a couple of weeks. Says nobody cares for him. His daughter has not made any contact in years. Says he does not like the neighborhood he lives at. Says it is infested with drugs and crime. No evidence of psychosis. No associated mania. No thoughts of violence. Says thoughts of suicide comes and goes.  Total Time spent with patient: 1 hour  Past Psychiatric History: History of MDD, SUD. He has been hospitalized here in the past. Patient has history of repeated suicidal behavior.  No past history of mania. He has been treated with various antidepressants over the years. No psychotropic  medication lately.   Is the patient at risk to self? Yes.    Has the patient been a risk to self in the past 6 months? Yes.    Has the patient been a risk to self within the distant past? Yes.    Is the patient a risk to others? No.  Has the patient been a risk to others in the past 6 months? No.  Has the patient been a risk to others within the distant past? Yes.     Prior Inpatient Therapy:   Prior Outpatient Therapy:    Alcohol Screening: 1. How often do you have a drink containing alcohol?: 2 to 4 times a month 2. How many drinks containing alcohol do you have on a typical day when you are drinking?: 5 or 6 3. How often do you have six or more drinks on one occasion?: Monthly AUDIT-C Score: 6 4. How often during the last year have you found that you were not able to stop drinking once you had started?: Less than monthly 5. How often during the last year have you failed to do what was normally expected from you becasue of drinking?: Less than monthly 6. How often during the last year have you needed a first drink in the morning to get yourself going after a heavy drinking session?: Less than monthly 7. How often during the last year have you had a feeling of guilt of remorse after drinking?: Less than monthly 8. How often during the last year have you been unable to remember what happened the night before because you had been drinking?: Less than monthly 9. Have you or someone else been injured as a result of your drinking?: Yes, but not in the last year 10. Has a relative or friend or a doctor or another health worker been concerned about your drinking or suggested you cut down?: No Alcohol Use Disorder Identification Test Final Score (AUDIT): 13 Intervention/Follow-up: Alcohol Education Substance Abuse History in the last 12 months:  Yes.   Consequences of Substance Abuse: As above Previous Psychotropic Medications: Yes  Psychological Evaluations: Yes  Past Medical History:  Past  Medical History:  Diagnosis Date  . Bipolar 1 disorder ([place])   . Depression   . Hypertension   . PTSD (post-traumatic stress disorder)    History reviewed. No pertinent surgical history. Family History:  Family History  Problem Relation Age of Onset  . Heart attack Other    Family Psychiatric  History: Strong family history of addiction. Says most of his siblings use heroin.  Tobacco Screening: Have you used any form of tobacco in the last 30 days? (Cigarettes, Smokeless Tobacco, Cigars, and/or Pipes): Yes Tobacco use, Select all that apply: 5 or more cigarettes per day Are you interested in Tobacco Cessation Medications?: No, patient refused Counseled patient on smoking cessation including recognizing danger situations, developing coping skills and basic information about quitting provided: Refused/Declined practical counseling Social History:  Social History   Substance and Sexual Activity  Alcohol Use Yes  . Alcohol/week: 1.2 oz  . Types: 2 Cans of beer per week   Comment: daily alcohol     Social History   Substance and Sexual Activity  Drug Use Yes  . Types: Cocaine, Marijuana   Comment: several times a day- pt reports hasn't used since detoxic     Additional Social History: Marital status: Divorced Divorced, when?: Pt has been divorced for 23 years What types of issues is patient dealing with in the relationship?: No current relationship Are you sexually active?: No What is your sexual orientation?: straight Has your sexual activity been affected by drugs, alcohol, medication, or emotional stress?: na Does patient have children?: Yes How many children?: 1 How is patient's relationship with their children?: One daughter in [place], sporadic contact.                         Allergies:  No Known Allergies Lab Results:  Results for orders placed or performed during the hospital encounter of 12/11/17 (from the past 48 hour(s))  Rapid urine drug screen  (hospital performed)     Status: Abnormal   Collection Time: 12/11/17  5:58 AM  Result Value Ref Range   Opiates NONE DETECTED NONE DETECTED   Cocaine NONE DETECTED NONE DETECTED   Benzodiazepines NONE DETECTED NONE DETECTED   Amphetamines NONE DETECTED NONE DETECTED   Tetrahydrocannabinol POSITIVE (A) NONE DETECTED   Barbiturates NONE DETECTED NONE DETECTED    Comment: (NOTE) DRUG SCREEN FOR MEDICAL PURPOSES ONLY.  IF CONFIRMATION IS NEEDED FOR ANY PURPOSE, NOTIFY LAB WITHIN 5 DAYS. LOWEST DETECTABLE LIMITS FOR URINE DRUG SCREEN Drug Class                     Cutoff (ng/mL) Amphetamine and metabolites    1000 Barbiturate and metabolites    200 Benzodiazepine                 161 Tricyclics and metabolites     300 Opiates and metabolites        300 Cocaine and metabolites        300 THC                            50   Urinalysis, Routine w reflex microscopic     Status: Abnormal   Collection Time: 12/11/17  6:01 AM  Result Value Ref Range   Color, Urine STRAW (A) YELLOW   APPearance CLEAR CLEAR   Specific Gravity, Urine 1.009 1.005 - 1.030   pH 5.0 5.0 - 8.0   Glucose, UA NEGATIVE NEGATIVE mg/dL   Hgb urine dipstick MODERATE (A) NEGATIVE   Bilirubin Urine NEGATIVE NEGATIVE   Ketones, ur NEGATIVE NEGATIVE mg/dL   Protein, ur NEGATIVE NEGATIVE mg/dL   Nitrite NEGATIVE NEGATIVE   Leukocytes, UA NEGATIVE NEGATIVE   RBC / HPF NONE SEEN 0 - 5 RBC/hpf   WBC, UA 0-5 0 - 5 WBC/hpf   Bacteria, UA NONE SEEN NONE SEEN   Squamous Epithelial / LPF 0-5 (A) NONE SEEN   Mucus PRESENT   Comprehensive metabolic panel     Status: Abnormal   Collection Time: 12/11/17  6:46 AM  Result Value Ref Range   Sodium 139 135 - 145 mmol/L   Potassium 3.2 (L) 3.5 - 5.1 mmol/L   Chloride 107 101 - 111 mmol/L   CO2 24 22 - 32 mmol/L   Glucose, Bld 112 (H) 65 - 99 mg/dL   BUN 21 (H) 6 - 20 mg/dL   Creatinine, Ser 1.13 0.61 - 1.24 mg/dL   Calcium 9.1 8.9 - 10.3 mg/dL  Total Protein 7.0 6.5 - 8.1  g/dL   Albumin 4.1 3.5 - 5.0 g/dL   AST 22 15 - 41 U/L   ALT 19 17 - 63 U/L   Alkaline Phosphatase 71 38 - 126 U/L   Total Bilirubin 0.5 0.3 - 1.2 mg/dL   GFR calc non Af Amer >60 >60 mL/min   GFR calc Af Amer >60 >60 mL/min    Comment: (NOTE) The eGFR has been calculated using the CKD EPI equation. This calculation has not been validated in all clinical situations. eGFR's persistently <60 mL/min signify possible Chronic Kidney Disease.    Anion gap 8 5 - 15  cbc     Status: None   Collection Time: 12/11/17  6:46 AM  Result Value Ref Range   WBC 7.1 4.0 - 10.5 K/uL   RBC 4.94 4.22 - 5.81 MIL/uL   Hemoglobin 15.2 13.0 - 17.0 g/dL   HCT 44.7 39.0 - 52.0 %   MCV 90.5 78.0 - 100.0 fL   MCH 30.8 26.0 - 34.0 pg   MCHC 34.0 30.0 - 36.0 g/dL   RDW 13.1 11.5 - 15.5 %   Platelets 185 150 - 400 K/uL  Ethanol     Status: None   Collection Time: 12/11/17  6:47 AM  Result Value Ref Range   Alcohol, Ethyl (B) <10 <10 mg/dL    Comment:        LOWEST DETECTABLE LIMIT FOR SERUM ALCOHOL IS 10 mg/dL FOR MEDICAL PURPOSES ONLY   Salicylate level     Status: None   Collection Time: 12/11/17  6:47 AM  Result Value Ref Range   Salicylate Lvl <8.1 2.8 - 30.0 mg/dL  Acetaminophen level     Status: Abnormal   Collection Time: 12/11/17  6:47 AM  Result Value Ref Range   Acetaminophen (Tylenol), Serum <10 (L) 10 - 30 ug/mL    Comment:        THERAPEUTIC CONCENTRATIONS VARY SIGNIFICANTLY. A RANGE OF 10-30 ug/mL MAY BE AN EFFECTIVE CONCENTRATION FOR MANY PATIENTS. HOWEVER, SOME ARE BEST TREATED AT CONCENTRATIONS OUTSIDE THIS RANGE. ACETAMINOPHEN CONCENTRATIONS >150 ug/mL AT 4 HOURS AFTER INGESTION AND >50 ug/mL AT 12 HOURS AFTER INGESTION ARE OFTEN ASSOCIATED WITH TOXIC REACTIONS.   Acetaminophen level     Status: Abnormal   Collection Time: 12/11/17  8:53 AM  Result Value Ref Range   Acetaminophen (Tylenol), Serum <10 (L) 10 - 30 ug/mL    Comment:        THERAPEUTIC CONCENTRATIONS  VARY SIGNIFICANTLY. A RANGE OF 10-30 ug/mL MAY BE AN EFFECTIVE CONCENTRATION FOR MANY PATIENTS. HOWEVER, SOME ARE BEST TREATED AT CONCENTRATIONS OUTSIDE THIS RANGE. ACETAMINOPHEN CONCENTRATIONS >150 ug/mL AT 4 HOURS AFTER INGESTION AND >50 ug/mL AT 12 HOURS AFTER INGESTION ARE OFTEN ASSOCIATED WITH TOXIC REACTIONS.     Blood Alcohol level:  Lab Results  Component Value Date   ETH <10 12/11/2017   ETH 190 (H) 82/99/3716    Metabolic Disorder Labs:  Lab Results  Component Value Date   HGBA1C 5.2 06/21/2016   MPG 100 04/09/2012   Lab Results  Component Value Date   PROLACTIN 21.0 (H) 06/21/2016   Lab Results  Component Value Date   CHOL 152 11/07/2017   TRIG 142 11/07/2017   HDL 40 (L) 11/07/2017   CHOLHDL 3.8 11/07/2017   VLDL 28 11/07/2017   LDLCALC 84 11/07/2017   LDLCALC 88 06/21/2016    Current Medications: Current Facility-Administered Medications  Medication Dose Route Frequency Provider  Last Rate Last Dose  . acetaminophen (TYLENOL) tablet 650 mg  650 mg Oral Q6H PRN Derrill Center, NP      . alum & mag hydroxide-simeth (MAALOX/MYLANTA) 200-200-20 MG/5ML suspension 30 mL  30 mL Oral Q4H PRN Derrill Center, NP      . hydrOXYzine (ATARAX/VISTARIL) tablet 25 mg  25 mg Oral TID PRN Derrill Center, NP      . Influenza vac split quadrivalent PF (FLUARIX) injection 0.5 mL  0.5 mL Intramuscular Tomorrow-1000 Cobos, Fernando A, MD      . lisinopril (PRINIVIL,ZESTRIL) tablet 5 mg  5 mg Oral Daily Derrill Center, NP   5 mg at 12/12/17 0820  . magnesium hydroxide (MILK OF MAGNESIA) suspension 30 mL  30 mL Oral Daily PRN Derrill Center, NP      . potassium chloride SA (K-DUR,KLOR-CON) CR tablet 20 mEq  20 mEq Oral BID Derrill Center, NP   20 mEq at 12/12/17 0820  . traZODone (DESYREL) tablet 50 mg  50 mg Oral QHS PRN Derrill Center, NP       PTA Medications: Medications Prior to Admission  Medication Sig Dispense Refill Last Dose  . ARIPiprazole (ABILIFY) 5  MG tablet Take 1 tablet (5 mg total) by mouth daily. For mood control (Patient not taking: Reported on 12/11/2017) 30 tablet 0 Not Taking at Unknown time  . atenolol (TENORMIN) 50 MG tablet Take 1 tablet (50 mg total) by mouth daily. (Patient not taking: Reported on 12/11/2017) 30 tablet 0 Not Taking at Unknown time  . buPROPion (WELLBUTRIN XL) 150 MG 24 hr tablet Take 1 tablet (150 mg total) by mouth daily. For depression (Patient not taking: Reported on 12/11/2017) 30 tablet 0 Not Taking at Unknown time  . folic acid (FOLVITE) 1 MG tablet Take 1 tablet (1 mg total) daily by mouth. (Patient not taking: Reported on 12/11/2017) 30 tablet 0 Not Taking at Unknown time  . lisinopril (PRINIVIL,ZESTRIL) 5 MG tablet Take 1 tablet (5 mg total) by mouth daily. (Patient not taking: Reported on 12/11/2017) 30 tablet 0 Not Taking at Unknown time  . Multiple Vitamin (MULTIVITAMIN WITH MINERALS) TABS tablet Take 1 tablet daily by mouth. (Patient not taking: Reported on 12/11/2017)   Not Taking at Unknown time  . pantoprazole (PROTONIX) 40 MG tablet Take 1 tablet (40 mg total) by mouth daily. (Patient not taking: Reported on 12/11/2017) 30 tablet 0 Not Taking at Unknown time  . pravastatin (PRAVACHOL) 40 MG tablet Take 1 tablet (40 mg total) by mouth every evening. (Patient not taking: Reported on 12/11/2017) 30 tablet 0 Not Taking at Unknown time  . prazosin (MINIPRESS) 2 MG capsule Take 1 capsule (2 mg total) by mouth at bedtime. For nightmares (Patient not taking: Reported on 12/11/2017) 30 capsule 0 Not Taking at Unknown time  . thiamine 100 MG tablet Take 1 tablet (100 mg total) daily by mouth. (Patient not taking: Reported on 12/11/2017)   Not Taking at Unknown time  . traZODone (DESYREL) 100 MG tablet Take 2 tablets (200 mg total) by mouth at bedtime. (Patient not taking: Reported on 12/11/2017) 60 tablet 0 Not Taking at Unknown time    Musculoskeletal: Strength & Muscle Tone: within normal limits Gait &  Station: normal Patient leans: N/A  Psychiatric Specialty Exam: Physical Exam  Constitutional: He appears well-developed and well-nourished.  HENT:  Head: Normocephalic and atraumatic.  Respiratory: Effort normal.  Neurological: He is alert.  Psychiatric:  As above  ROS  Blood pressure (!) 139/93, pulse 68, temperature 97.8 F (36.6 C), resp. rate 18, height _0  (1.727 m), weight 78.2 kg (172 lb 8 oz).Body mass index is 26.23 kg/m.  General Appearance: Was in his room under the sheets. During interview, he was tearful.   Eye Contact:  Moderate   Speech:  Clear and Coherent and Normal Rate  Volume:  Normal  Mood:  Depressed and Hopeless  Affect:  Blunted and mood congruent.  Thought Process:  Linear  Orientation:  Full (Time, Place, and Person)  Thought Content:  Rumination about his losses. No delusional theme. No preoccupation with violent thoughts. No hallucination in any modality.   Suicidal Thoughts:  Off and on. No intent to act here.   Homicidal Thoughts:  No  Memory:  Immediate;   Good Recent;   Fair Remote;   Good  Judgement:  Fair  Insight:  Good  Psychomotor Activity:  Decreased  Concentration:  Concentration: Fair and Attention Span: Fair  Recall:  Good  Fund of Knowledge:  Good  Language:  Good  Akathisia:  Negative  Handed:    AIMS (if indicated):     Assets:  Desire for Improvement Financial Resources/Insurance Housing Resilience  ADL's:  Fair  Cognition:  WNL  Sleep:  Number of Hours: 6.75    Treatment Plan Summary: Patient is severe depressed. He is a risk to self. He is very isolated and still grieving. We discussed medications listed below individually. Patient consented to treatment respectively after we explored the risks and benefits.   Psychiatric: MDD Recurrent Grief SUD  Medical: HTN  Psychosocial:  Limited support Unemployed.   PLAN: 1. Bupropion XL 300 mg daily 2. Mirtazapine 15 mg HS 3. Chlorpromazine 50 mg HS  4.  Continue home medical medications.  5. Encourage unit groups and activities 6. Monitor mood, behavior and interaction with peers 7.  SW would facilitate aftercare 8. Potassium replacement   Observation Level/Precautions:  15 minute checks  Laboratory:    Psychotherapy:    Medications:    Consultations:    Discharge Concerns:    Estimated LOS:  Other:     Physician Treatment Plan for Primary Diagnosis: <principal problem not specified> Long Term Goal(s): Improvement in symptoms so as ready for discharge  Short Term Goals: Ability to identify changes in lifestyle to reduce recurrence of condition will improve, Ability to verbalize feelings will improve, Ability to disclose and discuss suicidal ideas, Ability to demonstrate self-control will improve, Ability to identify and develop effective coping behaviors will improve, Ability to maintain clinical measurements within normal limits will improve, Compliance with prescribed medications will improve and Ability to identify triggers associated with substance abuse/mental health issues will improve  Physician Treatment Plan for Secondary Diagnosis: Active Problems:   MDD (major depressive disorder)  Long Term Goal(s): Improvement in symptoms so as ready for discharge  Short Term Goals: Ability to identify changes in lifestyle to reduce recurrence of condition will improve, Ability to verbalize feelings will improve, Ability to disclose and discuss suicidal ideas, Ability to demonstrate self-control will improve, Ability to identify and develop effective coping behaviors will improve, Ability to maintain clinical measurements within normal limits will improve, Compliance with prescribed medications will improve and Ability to identify triggers associated with substance abuse/mental health issues will improve  I certify that inpatient services furnished can reasonably be expected to improve the patient's condition.    [place],  MD 12/20/20183:40 PM

## 2017-12-12 NOTE — Progress Notes (Signed)
Pt is new to the unit earlier this afternoon.  He has been in the bed since the beginning of the shift.  He awoke briefly when Clinical research associatewriter entered his room to talk with him.  He was told that he had a medication scheduled for 2000 and initially said he would come up to get it, he chose to stay in bed.  Dosing was changed to 2100.  Writer had MHT check pt's VS again and pt's BP had come down, so pt said he would wait to take the med in the morning as originally scheduled.  Pt says he is still having some SI, but contracts for safety.  He denies HI/AVH.  He got up briefly for a snack, but went back to bed after receiving his snack.  Pt voiced no needs or concerns.  Support and encouragement offered.  Discharge plans are in process.  Safety maintained with q15 minute checks.

## 2017-12-12 NOTE — Progress Notes (Signed)
Patient denies SI, HI and AVH.  Patient has been isolative to room this shift and has had complaints of increased depression.   Assess patient for safety, offer medications as prescribed, engage patient in 1:1 staff talks.   Continue to monitor, patient able ot contract for safety.
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
## 2017-12-12 NOTE — Plan of Care (Signed)
  Safety: Periods of time without injury will increase 12/12/2017 2311 - Progressing by Delos HaringPhillips, Penelope Fittro A, RN Note Pt safe on the unit this evening   Coping: Ability to cope will improve 12/12/2017 2311 - Progressing by Delos HaringPhillips, Kitti Mcclish A, RN Note Pt seen on the unit interacting with peers
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
## 2017-12-13 DIAGNOSIS — R45851 Suicidal ideations: Secondary | ICD-10-CM

## 2017-12-13 DIAGNOSIS — F419 Anxiety disorder, unspecified: Secondary | ICD-10-CM

## 2017-12-13 DIAGNOSIS — F39 Unspecified mood [affective] disorder: Secondary | ICD-10-CM

## 2017-12-13 MED ORDER — HYDROXYZINE HCL 50 MG PO TABS
50.0000 mg | ORAL_TABLET | Freq: Every evening | ORAL | Status: DC | PRN
  Administered 2017-12-13 – 2017-12-16 (×4): 50 mg via ORAL
  Filled 2017-12-13: qty 7
  Filled 2017-12-13 (×4): qty 1

## 2017-12-13 MED ORDER — TRAZODONE HCL 150 MG PO TABS
150.0000 mg | ORAL_TABLET | Freq: Every evening | ORAL | Status: DC | PRN
  Administered 2017-12-13 – 2017-12-16 (×4): 150 mg via ORAL
  Filled 2017-12-13 (×2): qty 1
  Filled 2017-12-13: qty 7
  Filled 2017-12-13 (×2): qty 1

## 2017-12-13 NOTE — Progress Notes (Addendum)
Nursing Note: 0700-1900  D:  Pt presents with depressed mood and anxious affect. ""I am going to stay in bed today, I did not sleep well last night but better with the new medication.  I only had one nightmare last night but woke up sweating and needed to change clothes."  Pt stayed in room until close to 1700, sat 1:1 with pt to discuss self inventory.  States that he thinks frequently about suicide, "the only thing I could do here is bang my head against the wall, but I'm not going to do that."  Pt wrote that he has never folllowed discharge plans in the past.  "I have had too many terrible and dark things that happened in my life to share with anyone."  A:  Encouraged to verbalize needs and concerns, active listening and support provided.  Continued Q 15 minute safety checks.    R:  Pt. denies A/V hallucinations and is able to verbally contract for safety.

## 2017-12-13 NOTE — Progress Notes (Signed)
Adult Psychoeducational Group Note  Date: 12/12/17  :  9:30pm  Group Topic/Focus:  Wrap-Up Group:   The focus of this group is to help patients review their daily goal of treatment and discuss progress on daily workbooks.  Participation Level:  Active  Participation Quality:  Appropriate  Affect:  Appropriate  Cognitive:  Appropriate  Insight: Appropriate  Engagement in Group:  Engaged  Modes of Intervention:  Discussion  Additional Comments:  Patient attended but did not participate.   Chase Moss Chillemi 12/13/2017, 4:48 AM

## 2017-12-13 NOTE — Progress Notes (Signed)
D: Pt passive SI-contract denies HI/AVH. Pt is pleasant and cooperative. Pt stated he was anxious , pt encouraged to breath and walk to release anxiety. Pt seen on the unit breathing and walking and appeared calmer when taking his medications. Pt was given   A: Pt was offered support and encouragement. Pt was given scheduled medications. Pt was encourage to attend groups. Q 15 minute checks were done for safety.   R:Pt attends groups and interacts well with peers and staff. Pt is taking medication. Pt receptive to treatment and safety maintained on unit.

## 2017-12-13 NOTE — Plan of Care (Signed)
  Safety: Periods of time without injury will increase 12/13/2017 2152 - Progressing by Delos HaringPhillips, Timur Nibert A, RN Note Pt safe on the unit at this time   Coping: Ability to cope will improve 12/13/2017 2152 - Progressing by Delos HaringPhillips, Alexiss Iturralde A, RN Note Pt passive SI-contracts for safety

## 2017-12-13 NOTE — Tx Team (Signed)
Interdisciplinary Treatment and Diagnostic Plan Update  12/13/2017 Time of Session: 0830AM Chase Moss MRN: [id number]  Principal Diagnosis: MDD (major depressive disorder)  Secondary Diagnoses: Principal Problem:   MDD (major depressive disorder)   Current Medications:  Current Facility-Administered Medications  Medication Dose Route Frequency Provider Last Rate Last Dose  . acetaminophen (TYLENOL) tablet 650 mg  650 mg Oral Q6H PRN Oneta RackLewis, Tanika N, NP      . alum & mag hydroxide-simeth (MAALOX/MYLANTA) 200-200-20 MG/5ML suspension 30 mL  30 mL Oral Q4H PRN Oneta RackLewis, Tanika N, NP      . buPROPion (WELLBUTRIN XL) 24 hr tablet 300 mg  300 mg Oral Daily Izediuno, Delight OvensVincent A, MD   300 mg at 12/13/17 0935  . chlorproMAZINE (THORAZINE) tablet 50 mg  50 mg Oral QHS Izediuno, Delight OvensVincent A, MD   50 mg at 12/12/17 2103  . hydrOXYzine (ATARAX/VISTARIL) tablet 25 mg  25 mg Oral TID PRN Oneta RackLewis, Tanika N, NP   25 mg at 12/12/17 2103  . lisinopril (PRINIVIL,ZESTRIL) tablet 5 mg  5 mg Oral Daily Oneta RackLewis, Tanika N, NP   5 mg at 12/13/17 0935  . magnesium hydroxide (MILK OF MAGNESIA) suspension 30 mL  30 mL Oral Daily PRN Oneta RackLewis, Tanika N, NP      . mirtazapine (REMERON) tablet 15 mg  15 mg Oral QHS Izediuno, Delight OvensVincent A, MD   15 mg at 12/12/17 2103  . traZODone (DESYREL) tablet 50 mg  50 mg Oral QHS PRN Oneta RackLewis, Tanika N, NP   50 mg at 12/12/17 2103   PTA Medications: Medications Prior to Admission  Medication Sig Dispense Refill Last Dose  . ARIPiprazole (ABILIFY) 5 MG tablet Take 1 tablet (5 mg total) by mouth daily. For mood control (Patient not taking: Reported on 12/11/2017) 30 tablet 0 Not Taking at Unknown time  . atenolol (TENORMIN) 50 MG tablet Take 1 tablet (50 mg total) by mouth daily. (Patient not taking: Reported on 12/11/2017) 30 tablet 0 Not Taking at Unknown time  . buPROPion (WELLBUTRIN XL) 150 MG 24 hr tablet Take 1 tablet (150 mg total) by mouth daily. For depression (Patient not taking: Reported  on 12/11/2017) 30 tablet 0 Not Taking at Unknown time  . folic acid (FOLVITE) 1 MG tablet Take 1 tablet (1 mg total) daily by mouth. (Patient not taking: Reported on 12/11/2017) 30 tablet 0 Not Taking at Unknown time  . lisinopril (PRINIVIL,ZESTRIL) 5 MG tablet Take 1 tablet (5 mg total) by mouth daily. (Patient not taking: Reported on 12/11/2017) 30 tablet 0 Not Taking at Unknown time  . Multiple Vitamin (MULTIVITAMIN WITH MINERALS) TABS tablet Take 1 tablet daily by mouth. (Patient not taking: Reported on 12/11/2017)   Not Taking at Unknown time  . pantoprazole (PROTONIX) 40 MG tablet Take 1 tablet (40 mg total) by mouth daily. (Patient not taking: Reported on 12/11/2017) 30 tablet 0 Not Taking at Unknown time  . pravastatin (PRAVACHOL) 40 MG tablet Take 1 tablet (40 mg total) by mouth every evening. (Patient not taking: Reported on 12/11/2017) 30 tablet 0 Not Taking at Unknown time  . prazosin (MINIPRESS) 2 MG capsule Take 1 capsule (2 mg total) by mouth at bedtime. For nightmares (Patient not taking: Reported on 12/11/2017) 30 capsule 0 Not Taking at Unknown time  . thiamine 100 MG tablet Take 1 tablet (100 mg total) daily by mouth. (Patient not taking: Reported on 12/11/2017)   Not Taking at Unknown time  . traZODone (DESYREL) 100 MG tablet Take 2  tablets (200 mg total) by mouth at bedtime. (Patient not taking: Reported on 12/11/2017) 60 tablet 0 Not Taking at Unknown time    Patient Stressors: Financial difficulties Medication change or noncompliance Substance abuse  Patient Strengths: Wellsite geologist fund of knowledge Physical Health  Treatment Modalities: Medication Management, Group therapy, Case management,  1 to 1 session with clinician, Psychoeducation, Recreational therapy.   Physician Treatment Plan for Primary Diagnosis: MDD (major depressive disorder) Long Term Goal(s): Improvement in symptoms so as ready for discharge Improvement in symptoms so as ready for  discharge   Short Term Goals: Ability to identify changes in lifestyle to reduce recurrence of condition will improve Ability to verbalize feelings will improve Ability to disclose and discuss suicidal ideas Ability to demonstrate self-control will improve Ability to identify and develop effective coping behaviors will improve Ability to maintain clinical measurements within normal limits will improve Compliance with prescribed medications will improve Ability to identify triggers associated with substance abuse/mental health issues will improve Ability to identify changes in lifestyle to reduce recurrence of condition will improve Ability to verbalize feelings will improve Ability to disclose and discuss suicidal ideas Ability to demonstrate self-control will improve Ability to identify and develop effective coping behaviors will improve Ability to maintain clinical measurements within normal limits will improve Compliance with prescribed medications will improve Ability to identify triggers associated with substance abuse/mental health issues will improve  Medication Management: Evaluate patient's response, side effects, and tolerance of medication regimen.  Therapeutic Interventions: 1 to 1 sessions, Unit Group sessions and Medication administration.  Evaluation of Outcomes: Progressing  Physician Treatment Plan for Secondary Diagnosis: Principal Problem:   MDD (major depressive disorder)  Long Term Goal(s): Improvement in symptoms so as ready for discharge Improvement in symptoms so as ready for discharge   Short Term Goals: Ability to identify changes in lifestyle to reduce recurrence of condition will improve Ability to verbalize feelings will improve Ability to disclose and discuss suicidal ideas Ability to demonstrate self-control will improve Ability to identify and develop effective coping behaviors will improve Ability to maintain clinical measurements within normal  limits will improve Compliance with prescribed medications will improve Ability to identify triggers associated with substance abuse/mental health issues will improve Ability to identify changes in lifestyle to reduce recurrence of condition will improve Ability to verbalize feelings will improve Ability to disclose and discuss suicidal ideas Ability to demonstrate self-control will improve Ability to identify and develop effective coping behaviors will improve Ability to maintain clinical measurements within normal limits will improve Compliance with prescribed medications will improve Ability to identify triggers associated with substance abuse/mental health issues will improve     Medication Management: Evaluate patient's response, side effects, and tolerance of medication regimen.  Therapeutic Interventions: 1 to 1 sessions, Unit Group sessions and Medication administration.  Evaluation of Outcomes: Progressing   RN Treatment Plan for Primary Diagnosis: MDD (major depressive disorder) Long Term Goal(s): Knowledge of disease and therapeutic regimen to maintain health will improve  Short Term Goals: Ability to remain free from injury will improve, Ability to disclose and discuss suicidal ideas and Ability to identify and develop effective coping behaviors will improve  Medication Management: RN will administer medications as ordered by provider, will assess and evaluate patient's response and provide education to patient for prescribed medication. RN will report any adverse and/or side effects to prescribing provider.  Therapeutic Interventions: 1 on 1 counseling sessions, Psychoeducation, Medication administration, Evaluate responses to treatment, Monitor vital signs and CBGs  as ordered, Perform/monitor CIWA, COWS, AIMS and Fall Risk screenings as ordered, Perform wound care treatments as ordered.  Evaluation of Outcomes: Progressing   LCSW Treatment Plan for Primary Diagnosis: MDD  (major depressive disorder) Long Term Goal(s): Safe transition to appropriate next level of care at discharge, Engage patient in therapeutic group addressing interpersonal concerns.  Short Term Goals: Engage patient in aftercare planning with referrals and resources, Increase emotional regulation and Increase skills for wellness and recovery  Therapeutic Interventions: Assess for all discharge needs, 1 to 1 time with Social worker, Explore available resources and support systems, Assess for adequacy in community support network, Educate family and significant other(s) on suicide prevention, Complete Psychosocial Assessment, Interpersonal group therapy.  Evaluation of Outcomes: Progressing   Progress in Treatment: Attending groups: Yes. Participating in groups: Yes. Taking medication as prescribed: Yes. Toleration medication: Yes. Family/Significant other contact made: No, will contact:  pt's brother in law Patient understands diagnosis: Yes. Discussing patient identified problems/goals with staff: Yes. Medical problems stabilized or resolved: Yes. Denies suicidal/homicidal ideation: Yes. Issues/concerns per patient self-inventory: No. Other: n/a   New problem(s) identified: No, Describe:  n/a  New Short Term/Long Term Goal(s):  Discharge Plan or Barriers:   Reason for Continuation of Hospitalization: Anxiety Depression Medication stabilization Suicidal ideation Withdrawal symptoms  Estimated Length of Stay: Monday, 12/16/17  Attendees: Patient: 12/13/2017 12:49 PM  Physician: Dr. Jackquline BerlinIzediuno MD; Dr. Altamese Carolinaainville MD 12/13/2017 12:49 PM  Nursing: Thornton PapasSheila, Karen, Beverly RN 12/13/2017 12:49 PM  RN Care Manager:x 12/13/2017 12:49 PM  Social Worker: Chartered loss adjusterHeather Smart, LCSW 12/13/2017 12:49 PM  Recreational Therapist: x 12/13/2017 12:49 PM  Other: Armandina StammerAgnes Nwoko NP; Feliz Beamravis Money NP 12/13/2017 12:49 PM  Other:  12/13/2017 12:49 PM  Other: 12/13/2017 12:49 PM    Scribe for Treatment  Team: Ledell PeoplesHeather N Smart, LCSW 12/13/2017 12:49 PM

## 2017-12-13 NOTE — Progress Notes (Signed)
[facility] Note  12/13/2017 12:50 PM Chase Moss  MRN:  [id number]   Subjective:  Patient reports that he still feels very depressed and some passive SI thoughts with no plan. He reports that his depression makes him not want to get out of bed. He does contract for safety. He denies any medication side effects and plans to continue the current medications as prescribed. He denies any anxiety or HI/AVH.   Objective: Patient's chart and findings reviewed and discussed with treatment team. Patient is isolated to room and keeps his head covered with a blanket. He does move it for him to be able to see me. Will continue current medications since the Wellbutrin was just increased yesterday. He is in agreement with this plan. He is encouraged to get up for groups and he agrees to try to.  Principal Problem: MDD (major depressive disorder) Diagnosis:   Patient Active Problem List   Diagnosis Date Noted  . MDD (major depressive disorder) [F32.9] 12/11/2017  . Intentional overdose of drug in tablet form (HCC) [T50.902A] 11/08/2017  . Dyslipidemia [E78.5] 11/08/2017  . Constipation [K59.00] 11/08/2017  . Drug overdose [T50.901A] 11/05/2017  . Acute alcoholic intoxication without complication (HCC) [F10.929]   . Suicidal ideation [R45.851]   . Alcohol intoxication (HCC) [F10.929] 10/22/2017  . Alcohol withdrawal (HCC) [F10.239] 10/22/2017  . Polysubstance abuse (HCC) [F19.10]   . 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 Cocaine use disorder, moderate, dependence (HCC) [F14.20] 03/31/2017  . HTN (hypertension) [I10] 06/20/2016  . MDD (major depressive disorder), recurrent severe, without psychosis (HCC) [F33.2] 06/20/2016  . Tobacco use disorder [F17.200] 06/20/2016  . Trauma [T14.90XA] 07/27/2015  . Cannabis use disorder, moderate, dependence (HCC) [F12.20]   . PTSD (post-traumatic stress disorder) [F43.10]   . Substance induced mood  disorder (HCC) [F19.94] 07/07/2014  . Alcohol use disorder, moderate, dependence (HCC) [F10.20] 04/10/2012  . Suicidal thoughts [R45.851] 04/04/2012   Total Time spent with patient: 15 minutes  Past Psychiatric History: See H&P  Past Medical History:  Past Medical History:  Diagnosis Date  . Bipolar 1 disorder (HCC)   . Depression   . Hypertension   . PTSD (post-traumatic stress disorder)    History reviewed. No pertinent surgical history. Family History:  Family History  Problem Relation Age of Onset  . Heart attack Other    Family Psychiatric  History: See H&P Social History:  Social History   Substance and Sexual Activity  Alcohol Use Yes  . Alcohol/week: 1.2 oz  . Types: 2 Cans of beer per week   Comment: daily alcohol     Social History   Substance and Sexual Activity  Drug Use Yes  . Types: Cocaine, Marijuana   Comment: several times a day- pt reports hasn't used since detoxic     Social History   Socioeconomic History  . Marital status: Divorced    Spouse name: None  . Number of children: None  . Years of education: None  . Highest education level: None  Social Needs  . Financial resource strain: None  . Food insecurity - worry: None  . Food insecurity - inability: None  . Transportation needs - medical: None  . Transportation needs - non-medical: None  Occupational History  . None  Tobacco Use  . Smoking status: Current Every Day Smoker    Packs/day: 1.00    Years: 30.00    Pack years: 30.00    Types: Cigarettes  . Smokeless  tobacco: Never Used  Substance and Sexual Activity  . Alcohol use: Yes    Alcohol/week: 1.2 oz    Types: 2 Cans of beer per week    Comment: daily alcohol  . Drug use: Yes    Types: Cocaine, Marijuana    Comment: several times a day- pt reports hasn't used since detoxic   . Sexual activity: Yes  Other Topics Concern  . None  Social History Narrative  . None   Additional Social History:                          Sleep: Good  Appetite:  Fair  Current Medications: Current Facility-Administered Medications  Medication Dose Route Frequency Provider Last Rate Last Dose  . acetaminophen (TYLENOL) tablet 650 mg  650 mg Oral Q6H PRN Oneta RackLewis, Tanika N, NP      . alum & mag hydroxide-simeth (MAALOX/MYLANTA) 200-200-20 MG/5ML suspension 30 mL  30 mL Oral Q4H PRN Oneta RackLewis, Tanika N, NP      . buPROPion (WELLBUTRIN XL) 24 hr tablet 300 mg  300 mg Oral Daily Izediuno, Delight OvensVincent A, MD   300 mg at 12/13/17 0935  . chlorproMAZINE (THORAZINE) tablet 50 mg  50 mg Oral QHS Izediuno, Delight OvensVincent A, MD   50 mg at 12/12/17 2103  . hydrOXYzine (ATARAX/VISTARIL) tablet 25 mg  25 mg Oral TID PRN Oneta RackLewis, Tanika N, NP   25 mg at 12/12/17 2103  . lisinopril (PRINIVIL,ZESTRIL) tablet 5 mg  5 mg Oral Daily Oneta RackLewis, Tanika N, NP   5 mg at 12/13/17 0935  . magnesium hydroxide (MILK OF MAGNESIA) suspension 30 mL  30 mL Oral Daily PRN Oneta RackLewis, Tanika N, NP      . mirtazapine (REMERON) tablet 15 mg  15 mg Oral QHS Izediuno, Delight OvensVincent A, MD   15 mg at 12/12/17 2103  . traZODone (DESYREL) tablet 50 mg  50 mg Oral QHS PRN Oneta RackLewis, Tanika N, NP   50 mg at 12/12/17 2103    Lab Results: No results found for this or any previous visit (from the past 48 hour(s)).  Blood Alcohol level:  Lab Results  Component Value Date   ETH <10 12/11/2017   ETH 190 (H) 11/05/2017    Metabolic Disorder Labs: Lab Results  Component Value Date   HGBA1C 5.2 06/21/2016   MPG 100 04/09/2012   Lab Results  Component Value Date   PROLACTIN 21.0 (H) 06/21/2016   Lab Results  Component Value Date   CHOL 152 11/07/2017   TRIG 142 11/07/2017   HDL 40 (L) 11/07/2017   CHOLHDL 3.8 11/07/2017   VLDL 28 11/07/2017   LDLCALC 84 11/07/2017   LDLCALC 88 06/21/2016    Physical Findings: AIMS: Facial and Oral Movements Muscles of Facial Expression: None, normal Lips and Perioral Area: None, normal Jaw: None, normal Tongue: None, normal,Extremity  Movements Upper (arms, wrists, hands, fingers): None, normal Lower (legs, knees, ankles, toes): None, normal, Trunk Movements Neck, shoulders, hips: None, normal, Overall Severity Severity of abnormal movements (highest score from questions above): None, normal Incapacitation due to abnormal movements: None, normal Patient's awareness of abnormal movements (rate only patient's report): No Awareness, Dental Status Current problems with teeth and/or dentures?: No Does patient usually wear dentures?: No  CIWA:    COWS:     Musculoskeletal: Strength & Muscle Tone: within normal limits Gait & Station: normal Patient leans: N/A  Psychiatric Specialty Exam: Physical Exam  Nursing note and vitals  reviewed. Constitutional: He is oriented to person, place, and time. He appears well-developed and well-nourished.  Cardiovascular: Normal rate.  Respiratory: Effort normal.  Musculoskeletal: Normal range of motion.  Neurological: He is alert and oriented to person, place, and time.  Skin: Skin is warm.    Review of Systems  Constitutional: Negative.   HENT: Negative.   Eyes: Negative.   Respiratory: Negative.   Cardiovascular: Negative.   Gastrointestinal: Negative.   Genitourinary: Negative.   Musculoskeletal: Negative.   Skin: Negative.   Neurological: Negative.   Endo/Heme/Allergies: Negative.   Psychiatric/Behavioral: Positive for depression and suicidal ideas (passive no plan). Negative for hallucinations. The patient is not nervous/anxious.     Blood pressure (!) 141/81, pulse 75, temperature 97.8 F (36.6 C), temperature source Oral, resp. rate 20, height 5\' 8"  (1.727 m), weight 78.2 kg (172 lb 8 oz).Body mass index is 26.23 kg/m.  General Appearance: Disheveled  Eye Contact:  Good  Speech:  Clear and Coherent  Volume:  Decreased  Mood:  Depressed  Affect:  Depressed and Flat  Thought Process:  Linear and Descriptions of Associations: Intact  Orientation:  Full (Time,  Place, and Person)  Thought Content:  WDL  Suicidal Thoughts:  Yes.  without intent/plan  Homicidal Thoughts:  No  Memory:  Immediate;   Good Recent;   Good Remote;   Good  Judgement:  Good  Insight:  Good  Psychomotor Activity:  Normal  Concentration:  Concentration: Good and Attention Span: Good  Recall:  Good  Fund of Knowledge:  Good  Language:  Good  Akathisia:  No  Handed:  Right  AIMS (if indicated):     Assets:  Communication Skills Desire for Improvement Financial Resources/Insurance Social Support  ADL's:  Intact  Cognition:  WNL  Sleep:  Number of Hours: 5   Problems Addressed: MDD severe  Treatment Plan Summary: Daily contact with patient to assess and evaluate symptoms and progress in treatment, Medication management and Plan is to:  -Continue Wellbutrin XL 300 mg PO Daily for mood stability -Continue Remeron 15 mg PO QHS for mood stability -Continue Thorazine 50 mg PO QHS for mood stability and sleep -Continue Trazodone 50 mg PO QHS PRN for insomnia -Continue Vistaril 25 mg PO TID PRN for anxiety -Encourage group therapy participation  Maryfrances Bunnell, FNP 12/13/2017, 12:50 PM

## 2017-12-13 NOTE — Progress Notes (Signed)
Adult Psychoeducational Group Note  Date:  12/13/2017 Time:  10:08 PM  Group Topic/Focus:  Wrap-Up Group:   The focus of this group is to help patients review their daily goal of treatment and discuss progress on daily workbooks.  Participation Level:  Active  Participation Quality:  Appropriate  Affect:  Appropriate  Cognitive:  Appropriate  Insight: Appropriate  Engagement in Group:  Engaged  Modes of Intervention:  Discussion  Additional Comments:  Pt stated his goal for today was Spend time out of his room and interact more with his peers. Pt stated he accomplished his goal today and felt good about interacting with his peers. Pt rated his over all day an 8 out of 10. Chase Moss  Chase Moss 12/13/2017, 10:08 PM

## 2017-12-13 NOTE — Progress Notes (Addendum)
Recreation Therapy Notes  Date: 12/13/17 Time: 0930 Location: [address] Dayroom  Group Topic: Stress Management  Goal Area(s) Addresses:  Patient will verbalize importance of using healthy stress management.  Patient will identify positive emotions associated with healthy stress management.   Intervention: Stress Management  Activity :  Progressive Muscle Relaxation.  LRT read a script to lead patients through the stress management technique of progressive muscle relaxation.  Patients were to follow along to engage in the activity as LRT read the script.  Education:  Stress Management, Discharge Planning.   Education Outcome: Acknowledges edcuation/In group clarification offered/Needs additional education  Clinical Observations/Feedback: Pt did not attend group.    Alexina Niccoli, LRT/CTRS          Isabel Freese A 12/13/2017 11:11 AM 

## 2017-12-14 NOTE — Progress Notes (Signed)
[facility]  12/14/2017 2:23 PM Chase Moss  MRN:  [id number] Subjective:   51 y.o Caucasian male, divorced, lives at a rooming house, on SSID. Background history of MDD, SUD and Bereavement. Presented to the ER via the ambulance. Called himself as he was having lingering suicidal thoughts. Patient had earlier overdosed on 1700 mg of Trazodone and 30 mg of Mirtazapine. He had expressed thoughts of getting drunk by the bridge. He had expressed thoughts of jumping off the bridge once he is intoxicated with alcohol. He has been stressed by the holidays and upcoming anniversary of his sister who passed in January. Routine labs is significant for low potassium. Toxicology is negative,  UDS is positive for THC , BAL<10 mg/dl.  Chart reviewed today. Patient discussed at team today.  Staff reports that he has been isolating self a lot. He has not attended unit groups. No behavioral issues. He has been taking his medications as prescribed.  Seen today. Says he came out for the first time this afternoon. He is beginning to  Gradually feel better. Says he is now sleeping uninterrupted at night. No nightmares. He is ruminating less about his losses. Suicidal thoughts still comes and goes. Says he talked to staff about it and it helped him a lot ventilating. Patient is eating well. Says he plans to go to the gymn later today. No side effects from his medications. No evidence of psychosis. No evidence of mania. Encouraged.   Principal Problem: MDD (major depressive disorder) Diagnosis:   Patient Active Problem List   Diagnosis Date Noted  . MDD (major depressive disorder) [F32.9] 12/11/2017  . Intentional overdose of drug in tablet form (HCC) [T50.902A] 11/08/2017  . Dyslipidemia [E78.5] 11/08/2017  . Constipation [K59.00] 11/08/2017  . Drug overdose [T50.901A] 11/05/2017  . Acute alcoholic intoxication without complication (HCC) [F10.929]   . Suicidal ideation [R45.851]   . Alcohol intoxication  (HCC) [F10.929] 10/22/2017  . Alcohol withdrawal (HCC) [F10.239] 10/22/2017  . Polysubstance abuse (HCC) [F19.10]   . 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 Cocaine use disorder, moderate, dependence (HCC) [F14.20] 03/31/2017  . HTN (hypertension) [I10] 06/20/2016  . MDD (major depressive disorder), recurrent severe, without psychosis (HCC) [F33.2] 06/20/2016  . Tobacco use disorder [F17.200] 06/20/2016  . Trauma [T14.90XA] 07/27/2015  . Cannabis use disorder, moderate, dependence (HCC) [F12.20]   . PTSD (post-traumatic stress disorder) [F43.10]   . Substance induced mood disorder (HCC) [F19.94] 07/07/2014  . Alcohol use disorder, moderate, dependence (HCC) [F10.20] 04/10/2012  . Suicidal thoughts [R45.851] 04/04/2012   Total Time spent with patient: 20 minutes  Past Psychiatric History: As in H&P  Past Medical History:  Past Medical History:  Diagnosis Date  . Bipolar 1 disorder (HCC)   . Depression   . Hypertension   . PTSD (post-traumatic stress disorder)    History reviewed. No pertinent surgical history. Family History:  Family History  Problem Relation Age of Onset  . Heart attack Other    Family Psychiatric  History: As in H&P Social History:  Social History   Substance and Sexual Activity  Alcohol Use Yes  . Alcohol/week: 1.2 oz  . Types: 2 Cans of beer per week   Comment: daily alcohol     Social History   Substance and Sexual Activity  Drug Use Yes  . Types: Cocaine, Marijuana   Comment: several times a day- pt reports hasn't used since detoxic     Social History  Socioeconomic History  . Marital status: Divorced    Spouse name: None  . Number of children: None  . Years of education: None  . Highest education level: None  Social Needs  . Financial resource strain: None  . Food insecurity - worry: None  . Food insecurity - inability: None  . Transportation needs - medical: None  .  Transportation needs - non-medical: None  Occupational History  . None  Tobacco Use  . Smoking status: Current Every Day Smoker    Packs/day: 1.00    Years: 30.00    Pack years: 30.00    Types: Cigarettes  . Smokeless tobacco: Never Used  Substance and Sexual Activity  . Alcohol use: Yes    Alcohol/week: 1.2 oz    Types: 2 Cans of beer per week    Comment: daily alcohol  . Drug use: Yes    Types: Cocaine, Marijuana    Comment: several times a day- pt reports hasn't used since detoxic   . Sexual activity: Yes  Other Topics Concern  . None  Social History Narrative  . None   Additional Social History:                         Sleep: Good  Appetite:  Good  Current Medications: Current Facility-Administered Medications  Medication Dose Route Frequency Provider Last Rate Last Dose  . acetaminophen (TYLENOL) tablet 650 mg  650 mg Oral Q6H PRN Oneta Rack, NP      . alum & mag hydroxide-simeth (MAALOX/MYLANTA) 200-200-20 MG/5ML suspension 30 mL  30 mL Oral Q4H PRN Oneta Rack, NP      . buPROPion (WELLBUTRIN XL) 24 hr tablet 300 mg  300 mg Oral Daily Meggan Dhaliwal, Delight Ovens, MD   300 mg at 12/14/17 0827  . chlorproMAZINE (THORAZINE) tablet 50 mg  50 mg Oral QHS Jylian Pappalardo, Delight Ovens, MD   50 mg at 12/13/17 2201  . hydrOXYzine (ATARAX/VISTARIL) tablet 25 mg  25 mg Oral TID PRN Oneta Rack, NP   25 mg at 12/13/17 1825  . hydrOXYzine (ATARAX/VISTARIL) tablet 50 mg  50 mg Oral QHS PRN Nira Conn A, NP   50 mg at 12/13/17 2201  . lisinopril (PRINIVIL,ZESTRIL) tablet 5 mg  5 mg Oral Daily Oneta Rack, NP   5 mg at 12/14/17 0827  . magnesium hydroxide (MILK OF MAGNESIA) suspension 30 mL  30 mL Oral Daily PRN Oneta Rack, NP      . mirtazapine (REMERON) tablet 15 mg  15 mg Oral QHS Betsaida [place], Delight Ovens, MD   15 mg at 12/13/17 2202  . traZODone (DESYREL) tablet 150 mg  150 mg Oral QHS PRN Nira Conn A, NP   150 mg at 12/13/17 2201    Lab Results: No results  found for this or any previous visit (from the past 48 hour(s)).  Blood Alcohol level:  Lab Results  Component Value Date   ETH <10 12/11/2017   ETH 190 (H) 11/05/2017    Metabolic Disorder Labs: Lab Results  Component Value Date   HGBA1C 5.2 06/21/2016   MPG 100 04/09/2012   Lab Results  Component Value Date   PROLACTIN 21.0 (H) 06/21/2016   Lab Results  Component Value Date   CHOL 152 11/07/2017   TRIG 142 11/07/2017   HDL 40 (L) 11/07/2017   CHOLHDL 3.8 11/07/2017   VLDL 28 11/07/2017   LDLCALC 84 11/07/2017   LDLCALC 88  06/21/2016    Physical Findings: AIMS: Facial and Oral Movements Muscles of Facial Expression: None, normal Lips and Perioral Area: None, normal Jaw: None, normal Tongue: None, normal,Extremity Movements Upper (arms, wrists, hands, fingers): None, normal Lower (legs, knees, ankles, toes): None, normal, Trunk Movements Neck, shoulders, hips: None, normal, Overall Severity Severity of abnormal movements (highest score from questions above): None, normal Incapacitation due to abnormal movements: None, normal Patient's awareness of abnormal movements (rate only patient's report): No Awareness, Dental Status Current problems with teeth and/or dentures?: No Does patient usually wear dentures?: No  CIWA:    COWS:     Musculoskeletal: Strength & Muscle Tone: within normal limits Gait & Station: normal Patient leans: N/A  Psychiatric Specialty Exam: Physical Exam  Constitutional: He appears well-developed and well-nourished.  HENT:  Head: Normocephalic and atraumatic.  Respiratory: Effort normal.  Neurological: He is alert.  Psychiatric:  As above.     ROS  Blood pressure 130/76, pulse 72, temperature (!) 97.5 F (36.4 C), temperature source Oral, resp. rate 16, height 5\' 8"  (1.727 m), weight 78.2 kg (172 lb 8 oz).Body mass index is 26.23 kg/m.  General Appearance: Much calmer today, better rapport.  Eye Contact:  Good  Speech:  Clear and  Coherent and Normal Rate  Volume:  Normal  Mood:  Less irritable and less depressed.   Affect:  Restricted  Thought Process:  Linear  Orientation:  Full (Time, Place, and Person)  Thought Content:  Less negative ruminations. No delusional theme. No preoccupation with violent thoughts. No hallucination in any modality.   Suicidal Thoughts:  Off and on but less intense. Able to talk to staff if overwhelmed.   Homicidal Thoughts:  No  Memory:  Immediate;   Good Recent;   Good Remote;   Good  Judgement:  Better  Insight:  Good  Psychomotor Activity:  Better  Concentration:  Concentration: Good and Attention Span: Good  Recall:  Good  Fund of Knowledge:  Fair  Language:  Good  Akathisia:  Negative  Handed:    AIMS (if indicated):     Assets:  Communication Skills Desire for Improvement Physical Health Resilience  ADL's:  Intact  Cognition:  WNL  Sleep:  Number of Hours: 6.75     Treatment Plan Summary: Patient is gradually improving. He is tolerating his medications well. Suicidal thoughts are less intense. Hopelessness and worthlessness perpetuated by the holidays. We would evaluate him further.  Psychiatric: MDD Recurrent Grief SUD  Medical: HTN  Psychosocial:  Limited support Unemployed.   PLAN: 1. continue medications at current dose daily 2. Encourage unit groups and therapeutic activities 3. Continue to monitor mood, behavior and interaction with peers.    Georgiann CockerVincent A Maddux First, MD 12/14/2017, 2:23 PM

## 2017-12-14 NOTE — Progress Notes (Signed)
Adult Psychoeducational Group Note  Date:  12/14/2017 Time:  10:57 PM  Group Topic/Focus:  Wrap-Up Group:   The focus of this group is to help patients review their daily goal of treatment and discuss progress on daily workbooks.  Participation Level:  Active  Participation Quality:  Appropriate  Affect:  Appropriate  Cognitive:  Appropriate  Insight: Appropriate  Engagement in Group:  Engaged  Modes of Intervention:  Discussion  Additional Comments:  Pt stated his goal for today was to talk with his doctor about his medication issues. Pt stated he was able to accomplished his goal along with getting some much needed rest. Pt rated his over all day a 7 out of 10. Pt stated he attended all groups held today.  Felipa FurnaceChristopher  [name] 12/14/2017, 10:57 PM

## 2017-12-14 NOTE — Plan of Care (Signed)
Patient verbalizes understanding of information, education provided.  Patient has not engaged in self harm, denies thoughts to do so. 

## 2017-12-14 NOTE — [facility] Group Notes (Signed)
LCSW Group Therapy Note  Date/Time:  12/14/2017   10:00AM-11:00AM  Type of Therapy and Topic:  Group Therapy:  Fears and Unhealthy/Healthy Coping Skills  Participation Level:  Did Not Attend   Description of Group:  The focus of this group was to discuss some of the prevalent fears that patients experience, and to identify the commonalities among group members.  A fun exercise was used to initiate the discussion, followed by writing on the white board a group-generated list of unhealthy coping and healthy coping techniques to deal with each fear.    Therapeutic Goals: 1. Patient will be able to distinguish between healthy and unhealthy coping skills 2. Patient will identify and describe 3 fears they experience 3. Patient will identify one positive coping strategy for each fear they experience 4. Patient will respond empathetically to peers' statements regarding fears they experience  Summary of Patient Progress:  N/A  Therapeutic Modalities Cognitive Behavioral Therapy Motivational Interviewing  Wen Merced Grossman-Orr, LCSW      

## 2017-12-14 NOTE — [facility] Group Notes (Signed)
[facility] Group Notes:  (Nursing/MHT/Case Management/Adjunct)  Date:  12/14/2017  Time:  1330  Type of Therapy:  Nurse Education - Managing Negative Self Talk  Participation Level:  Active  Participation Quality:  Attentive  Affect:  Blunted and Depressed  Cognitive:  Alert  Insight:  Improving  Engagement in Group:  Engaged  Modes of Intervention:  Discussion and Education  Summary of Progress/Problems: Patient educated on ways to "rewrite" negative self talk into positive statements.   Merian CapronFriedman, Jasier Calabretta [facility] 12/14/2017, 1415

## 2017-12-14 NOTE — Progress Notes (Signed)
D: Patient observed cautious in his approach with this Clinical research associatewriter though cooperative. Minimal information provided. Patient's affect flat, mood depressed. Per self inventory and discussions with writer, rates depression at an 8 /10, hopelessness at a 6/10 and anxiety at an 8/10. Rates sleep as good, appetite as good, energy as low and concentration as good.  States goal for today is to wok on "my depression and attitude towards facing each day. Try to stay positive and participate in group." Denies pain, physical complaints.   A: Medicated per orders, no prns requested or required. Level III obs in place for safety. Emotional support offered and self inventory reviewed. Encouraged completion of Suicide Safety Plan and programming participation.  R: Patient verbalizes understanding of POC. Patient denies SI/HI/AVH and remains safe on level III obs. Will continue to monitor closely and make verbal contact frequently.

## 2017-12-14 NOTE — Plan of Care (Signed)
  Self-Concept: Ability to verbalize positive feelings about self will improve 12/14/2017 2219 - Progressing by Delos HaringPhillips, Dorien Mayotte A, RN Note Pt stated he was feeling better about his situation    Self-Concept: Level of anxiety will decrease 12/14/2017 2219 - Progressing by Delos HaringPhillips, Kase Shughart A, RN Note Pt stated he was feeling less anxious this evening

## 2017-12-14 NOTE — Progress Notes (Signed)
D: Pt denies SI/HI/AVH. Pt is pleasant and cooperative. Pt very visible and interactive in the dayroom this evening. Pt stated he had the best sleep he's had in a long time last night  A: Pt was offered support and encouragement. Pt was given scheduled medications. Pt was encourage to attend groups. Q 15 minute checks were done for safety.   R:Pt attends groups and interacts well with peers and staff. Pt is taking medication. Pt has no complaints at this time .Pt receptive to treatment and safety maintained on unit.

## 2017-12-15 MED ORDER — BUPROPION HCL ER (XL) 150 MG PO TB24
450.0000 mg | ORAL_TABLET | Freq: Every day | ORAL | Status: DC
  Administered 2017-12-16 – 2017-12-17 (×2): 450 mg via ORAL
  Filled 2017-12-15 (×4): qty 3

## 2017-12-15 MED ORDER — BUPROPION HCL ER (XL) 150 MG PO TB24
150.0000 mg | ORAL_TABLET | Freq: Once | ORAL | Status: AC
  Administered 2017-12-15: 150 mg via ORAL
  Filled 2017-12-15 (×2): qty 1

## 2017-12-15 NOTE — [facility] Group Notes (Signed)
[facility] LCSW Group Therapy Note  Date/Time:  12/15/2017 10:00-11:00AM  Type of Therapy and Topic:  Group Therapy:  Healthy and Unhealthy Supports  Participation Level:  Did Not Attend   Description of Group:  Patients in this group were introduced to the idea of adding a variety of healthy supports to address the various needs in their lives. The picture on the front of Sunday's workbook was used to demonstrate why more supports are needed in every patient's life.  Patients identified and described healthy supports versus unhealthy supports in general, then gave examples of each in their own lives.   They discussed what additional healthy supports could be helpful in their recovery and wellness after discharge in order to prevent future hospitalizations.   An emphasis was placed on using counselor, doctor, therapy groups, 12-step groups, and problem-specific support groups to expand supports.  They also worked as a group on developing a specific plan for several patients to deal with unhealthy supports through boundary-setting, psychoeducation with loved ones, and even termination of relationships.   Therapeutic Goals:   1)  discuss importance of adding supports to stay well once out of the hospital  2)  compare healthy versus unhealthy supports and identify some examples of each  3)  generate ideas and descriptions of healthy supports that can be added  4)  offer mutual support about how to address unhealthy supports  5)  encourage active participation in and adherence to discharge plan    Summary of Patient Progress:  Not applicable   Therapeutic Modalities:   Motivational Interviewing Brief Solution-Focused Therapy  Ambrose MantleMareida Grossman-Orr, LCSW

## 2017-12-15 NOTE — Progress Notes (Signed)
Patient stated he kept waking up all night with same night mare having same dream that people are chasing him trying to kill him and blame him for something he did not do.  Has had night sweats.

## 2017-12-15 NOTE — Progress Notes (Signed)
D: Pt denies SI/HI/AVH. Pt is pleasant and cooperative. Pt stated he was doing better, only issue was the sleep issues.   A: Pt was offered support and encouragement. Pt was given scheduled medications. Pt was encourage to attend groups. Q 15 minute checks were done for safety.   R:Pt attends groups and interacts well with peers and staff. Pt is taking medication. Pt receptive to treatment and safety maintained on unit.

## 2017-12-15 NOTE — [facility] Suicide Risk Assessment (Signed)
HH INPATIENT:  Family/Significant Other Suicide Prevention Education  Suicide Prevention Education:  Education Completed; Bettey CostaRichard Rogers, brother in law, [phone] has been identified by the patient as the family member/significant other with whom the patient will be residing, and identified as the person(s) who will aid the patient in the event of a mental health crisis (suicidal ideations/suicide attempt).  With written consent from the patient, the family member/significant other has been provided the following suicide prevention education, prior to the and/or following the discharge of the patient.  BROTHER-IN-LAW STATES PT HAS NO ACCESS TO GUNS.  HE IS THE ONLY PERSON WHO REALLY KNOWS PT ANY MORE, AND WAS NOT AWARE THAT PT HAD BEEN HOSPITALIZED.  HE ASKED THAT STAFF ENCOURAGE PT TO CALL HIM.  The suicide prevention education provided includes the following:  Suicide risk factors  Suicide prevention and interventions  National Suicide Hotline telephone number  [facility] assessment telephone number  [facility] 911  [facility] and/or [facility]idential [facility] Crisis [facility] telephone number  Request made of family/significant other to:  Remove weapons (e.g., guns, rifles, knives), all items previously/currently identified as safety concern.    Remove drugs/medications (over-the-counter, prescriptions, illicit drugs), all items previously/currently identified as a safety concern.  The family member/significant other verbalizes understanding of the suicide prevention education information provided.  The family member/significant other agrees to remove the items of safety concern listed above.  Carloyn JaegerMareida J Grossman-Orr 12/15/2017, 9:30 AM

## 2017-12-15 NOTE — Progress Notes (Signed)
Patient's self inventory sheet, patient has poor sleep, nightmares.  Sleep medication is helpful.  Good appetite, low energy level, good concentration.  Rated depression and hopeless 5.  Anxiety 8.  Denied withdrawals.  Denied SI.  Denied physical problems.  Denied physical pain.  Goal is get meds right so he does not have night mares.  Plans to talk to MD/nurse.  No discharge plans.

## 2017-12-15 NOTE — Progress Notes (Addendum)
[facility] MD Progress Note  12/15/2017 11:05 AM Chase Moss  MRN:  [id number] Subjective:   51 y.o Caucasian male, divorced, lives at a rooming house, on SSID. Background history of MDD, SUD and Bereavement. Presented to the ER via the ambulance. Called himself as he was having lingering suicidal thoughts. Patient had earlier overdosed on 1700 mg of Trazodone and 30 mg of Mirtazapine. He had expressed thoughts of getting drunk by the bridge. He had expressed thoughts of jumping off the bridge once he is intoxicated with alcohol. He has been stressed by the holidays and upcoming anniversary of his sister who passed in January. Routine labs is significant for low potassium. Toxicology is negative,  UDS is positive for THC , BAL<10 mg/dl.  Chart reviewed today. Patient discussed at team today.  Staff reports that he was pleasant yesterday. He participated with activities. He did report poor sleep last night. No suicidal thoughts expressed. He has been taking his medications as prescribed. SW contacted his brother inlaw. He had no idea he was in hospital.  Seen today. Says he did not sleep well last night. His sleep was broken with nightmares of people chasing him. Says he was flying. He has not gotten out of bed today. Encouraged to attend unit groups and activities. Encouraged to keep self busy today. Says he was busy during the day yesterday. He enjoyed recreational activities yesterday.  Principal Problem: MDD (major depressive disorder) Diagnosis:   Patient Active Problem List   Diagnosis Date Noted  . MDD (major depressive disorder) [F32.9] 12/11/2017  . Intentional overdose of drug in tablet form (HCC) [T50.902A] 11/08/2017  . Dyslipidemia [E78.5] 11/08/2017  . Constipation [K59.00] 11/08/2017  . Drug overdose [T50.901A] 11/05/2017  . Acute alcoholic intoxication without complication (HCC) [F10.929]   . Suicidal ideation [R45.851]   . Alcohol intoxication (HCC) [F10.929] 10/22/2017  .  Alcohol withdrawal (HCC) [F10.239] 10/22/2017  . Polysubstance abuse (HCC) [F19.10]   . 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 Cocaine use disorder, moderate, dependence (HCC) [F14.20] 03/31/2017  . HTN (hypertension) [I10] 06/20/2016  . MDD (major depressive disorder), recurrent severe, without psychosis (HCC) [F33.2] 06/20/2016  . Tobacco use disorder [F17.200] 06/20/2016  . Trauma [T14.90XA] 07/27/2015  . Cannabis use disorder, moderate, dependence (HCC) [F12.20]   . PTSD (post-traumatic stress disorder) [F43.10]   . Substance induced mood disorder (HCC) [F19.94] 07/07/2014  . Alcohol use disorder, moderate, dependence (HCC) [F10.20] 04/10/2012  . Suicidal thoughts [R45.851] 04/04/2012   Total Time spent with patient: 20 minutes  Past Psychiatric History: As in H&P  Past Medical History:  Past Medical History:  Diagnosis Date  . Bipolar 1 disorder (HCC)   . Depression   . Hypertension   . PTSD (post-traumatic stress disorder)    History reviewed. No pertinent surgical history. Family History:  Family History  Problem Relation Age of Onset  . Heart attack Other    Family Psychiatric  History: As in H&P Social History:  Social History   Substance and Sexual Activity  Alcohol Use Yes  . Alcohol/week: 1.2 oz  . Types: 2 Cans of beer per week   Comment: daily alcohol     Social History   Substance and Sexual Activity  Drug Use Yes  . Types: Cocaine, Marijuana   Comment: several times a day- pt reports hasn't used since detoxic     Social History   Socioeconomic History  . Marital status: Divorced    Spouse name:  None  . Number of children: None  . Years of education: None  . Highest education level: None  Social Needs  . Financial resource strain: None  . Food insecurity - worry: None  . Food insecurity - inability: None  . Transportation needs - medical: None  . Transportation needs - non-medical:  None  Occupational History  . None  Tobacco Use  . Smoking status: Current Every Day Smoker    Packs/day: 1.00    Years: 30.00    Pack years: 30.00    Types: Cigarettes  . Smokeless tobacco: Never Used  Substance and Sexual Activity  . Alcohol use: Yes    Alcohol/week: 1.2 oz    Types: 2 Cans of beer per week    Comment: daily alcohol  . Drug use: Yes    Types: Cocaine, Marijuana    Comment: several times a day- pt reports hasn't used since detoxic   . Sexual activity: Yes  Other Topics Concern  . None  Social History Narrative  . None   Additional Social History:                         Sleep: Good  Appetite:  Good  Current Medications: Current Facility-Administered Medications  Medication Dose Route Frequency Provider Last Rate Last Dose  . acetaminophen (TYLENOL) tablet 650 mg  650 mg Oral Q6H PRN Oneta RackLewis, Tanika N, NP      . alum & mag hydroxide-simeth (MAALOX/MYLANTA) 200-200-20 MG/5ML suspension 30 mL  30 mL Oral Q4H PRN Oneta RackLewis, Tanika N, NP      . buPROPion (WELLBUTRIN XL) 24 hr tablet 300 mg  300 mg Oral Daily Izediuno, Delight OvensVincent A, MD   300 mg at 12/15/17 0804  . chlorproMAZINE (THORAZINE) tablet 50 mg  50 mg Oral QHS Izediuno, Delight OvensVincent A, MD   50 mg at 12/14/17 2243  . hydrOXYzine (ATARAX/VISTARIL) tablet 25 mg  25 mg Oral TID PRN Oneta RackLewis, Tanika N, NP   25 mg at 12/13/17 1825  . hydrOXYzine (ATARAX/VISTARIL) tablet 50 mg  50 mg Oral QHS PRN Nira ConnBerry, Jason A, NP   50 mg at 12/14/17 2243  . lisinopril (PRINIVIL,ZESTRIL) tablet 5 mg  5 mg Oral Daily Oneta RackLewis, Tanika N, NP   5 mg at 12/15/17 0804  . magnesium hydroxide (MILK OF MAGNESIA) suspension 30 mL  30 mL Oral Daily PRN Oneta RackLewis, Tanika N, NP      . mirtazapine (REMERON) tablet 15 mg  15 mg Oral QHS Izediuno, Delight OvensVincent A, MD   15 mg at 12/14/17 2243  . traZODone (DESYREL) tablet 150 mg  150 mg Oral QHS PRN Nira ConnBerry, Jason A, NP   150 mg at 12/14/17 2243    Lab Results: No results found for this or any previous visit  (from the past 48 hour(s)).  Blood Alcohol level:  Lab Results  Component Value Date   ETH <10 12/11/2017   ETH 190 (H) 11/05/2017    Metabolic Disorder Labs: Lab Results  Component Value Date   HGBA1C 5.2 06/21/2016   MPG 100 04/09/2012   Lab Results  Component Value Date   PROLACTIN 21.0 (H) 06/21/2016   Lab Results  Component Value Date   CHOL 152 11/07/2017   TRIG 142 11/07/2017   HDL 40 (L) 11/07/2017   CHOLHDL 3.8 11/07/2017   VLDL 28 11/07/2017   LDLCALC 84 11/07/2017   LDLCALC 88 06/21/2016    Physical Findings: AIMS: Facial and Oral Movements Muscles  of Facial Expression: None, normal Lips and Perioral Area: None, normal Jaw: None, normal Tongue: None, normal,Extremity Movements Upper (arms, wrists, hands, fingers): None, normal Lower (legs, knees, ankles, toes): None, normal, Trunk Movements Neck, shoulders, hips: None, normal, Overall Severity Severity of abnormal movements (highest score from questions above): None, normal Incapacitation due to abnormal movements: None, normal Patient's awareness of abnormal movements (rate only patient's report): No Awareness, Dental Status Current problems with teeth and/or dentures?: No Does patient usually wear dentures?: No  CIWA:    COWS:     Musculoskeletal: Strength & Muscle Tone: within normal limits Gait & Station: normal Patient leans: N/A  Psychiatric Specialty Exam: Physical Exam  Constitutional: He appears well-developed and well-nourished.  HENT:  Head: Normocephalic and atraumatic.  Respiratory: Effort normal.  Neurological: He is alert.  Psychiatric:  As above.     ROS  Blood pressure 126/83, pulse 70, temperature 97.7 F (36.5 C), temperature source Oral, resp. rate 18, height 5\' 8"  (1.727 m), weight 78.2 kg (172 lb 8 oz).Body mass index is 26.23 kg/m.  General Appearance: In bed sleeping. Air running at high temperature. Warmed up to interview  Eye Contact:  Good  Speech:  Normal rate  and tone  Volume:  Normal  Mood: Still down  Affect:  Restricted  Thought Process:  Linear  Orientation:  Full (Time, Place, and Person)  Thought Content:  Less negative ruminations. No delusional theme. No preoccupation with violent thoughts. No hallucination in any modality.   Suicidal Thoughts:  No suicidal thoughts today.   Homicidal Thoughts:  No  Memory:  WNL  Judgement:  Better  Insight:  Good  Psychomotor Activity:  Better  Concentration:  Concentration: Good and Attention Span: Good  Recall:  Good  Fund of Knowledge:  Fair  Language:  Good  Akathisia:  Negative  Handed:    AIMS (if indicated):     Assets:  Communication Skills Desire for Improvement Physical Health Resilience  ADL's:  Intact  Cognition:  WNL  Sleep:  Number of Hours: 4.75     Treatment Plan Summary: Patient is gradually improving. He is tolerating his medications well. Hopelessness and worthlessness perpetuated by the holidays. We plan to adjust his medications as below. We would evaluate him further.   Psychiatric: MDD Recurrent Grief SUD  Medical: HTN  Psychosocial:  Limited support Unemployed.   PLAN: 1. Increase Bupropion to 450 mg daily 2. Encourage unit groups and therapeutic activities 3. Continue to monitor mood, behavior and interaction with peers.    Georgiann CockerVincent A Izediuno, MD 12/15/2017, 11:05 AMPatient ID: Chase Moss, male   DOB: 06/30/1966, 51 y.o.   MRN: [id number]

## 2017-12-15 NOTE — Progress Notes (Signed)
D:  Patient denied SI and HI, contracts for safety.  Denied A/V hallucinations.  Denied pain. A:  Medications administered per MD orders.  Emotional support and encouragement given patient. R:  Safety maintained with 15 minute checks.  

## 2017-12-15 NOTE — Progress Notes (Signed)
Pt stated he was waking up having night mares this evening

## 2017-12-15 NOTE — Plan of Care (Signed)
Nurse discussed depression, anxiety, coping skills with patient.  

## 2017-12-15 NOTE — Plan of Care (Signed)
  Activity: Sleeping patterns will improve 12/15/2017 2015 - Progressing by Delos HaringPhillips, Danish Ruffins A, RN Note Pt slept 4 hrs last night   Coping: Ability to verbalize frustrations and anger appropriately will improve 12/15/2017 2015 - Progressing by Delos HaringPhillips, Landy Mace A, RN Note Pt stated he was concerned about his sleeping/ nightmares

## 2017-12-16 NOTE — Plan of Care (Signed)
  Progressing Activity: Interest or engagement in activities will improve 12/16/2017 0953 - Progressing by Angela AdamBeaudry, Elon Eoff E, RN Sleeping patterns will improve 12/16/2017 0953 - Progressing by Angela AdamBeaudry, Amitai Delaughter E, RN Education: Emotional status will improve 12/16/2017 0953 - Progressing by Angela AdamBeaudry, Nakima Fluegge E, RN Mental status will improve 12/16/2017 0953 - Progressing by Angela AdamBeaudry, Melquisedec Journey E, RN Coping: Ability to verbalize frustrations and anger appropriately will improve 12/16/2017 0953 - Progressing by Angela AdamBeaudry, Alvina Strother E, RN Ability to demonstrate self-control will improve 12/16/2017 0953 - Progressing by Angela AdamBeaudry, Salik Grewell E, RN Activity: Interest or engagement in leisure activities will improve 12/16/2017 0953 - Progressing by Angela AdamBeaudry, Wisam Siefring E, RN Imbalance in normal sleep/wake cycle will improve 12/16/2017 0953 - Progressing by Angela AdamBeaudry, Tawan Degroote E, RN Education: Utilization of techniques to improve thought processes will improve 12/16/2017 0953 - Progressing by Angela AdamBeaudry, Yuko Coventry E, RN Knowledge of the prescribed therapeutic regimen will improve 12/16/2017 0953 - Progressing by Angela AdamBeaudry, Michelangelo Rindfleisch E, RN Coping: Ability to cope will improve 12/16/2017 0953 - Progressing by Angela AdamBeaudry, Sima Lindenberger E, RN Health Behavior/Discharge Planning: Ability to make decisions will improve 12/16/2017 0953 - Progressing by Angela AdamBeaudry, Annelisa Ryback E, RN

## 2017-12-16 NOTE — Progress Notes (Signed)
Adult Psychoeducational Group Note  Date:  12/16/2017 Time:  4:40 AM  Group Topic/Focus:  Wrap-Up Group:   The focus of this group is to help patients review their daily goal of treatment and discuss progress on daily workbooks.  Participation Level:  Active  Participation Quality:  Appropriate  Affect:  Appropriate  Cognitive:  Appropriate  Insight: Appropriate  Engagement in Group:  Engaged  Modes of Intervention:  Discussion  Additional Comments:  Pt stated his goal for today was to interact more with his peers. Pt stated he accomplished his goal today and felt good about it. Pt rated his over all day a 4 out of 10. Pt stated a call from his family member help improve his day.  Felipa FurnaceChristopher  Jermie Hippe 12/16/2017, 4:40 AM

## 2017-12-16 NOTE — Progress Notes (Signed)
Writer assume care for Pt. Pt is seen laying in bed with eyes closed. Respirations even and unlabored. Will monitor. 

## 2017-12-16 NOTE — Progress Notes (Signed)
Recreation Therapy Notes  Date: 12/16/17 Time: 0930 Location: [address] Group Room  Group Topic: Stress Management  Goal Area(s) Addresses:  Patient will verbalize importance of using healthy stress management.  Patient will identify positive emotions associated with healthy stress management.   Intervention: Stress Management  Activity :  Meditation.  LRT introduced the stress management technique of meditation.  LRT played Moss meditation from the Calm app.  Patients were to listen and follow along as meditation was played.  Education: Stress Management, Discharge Planning.   Education Outcome: Acknowledges edcuation/In group clarification offered/Needs additional education  Clinical Observations/Feedback: Pt did not attend group.   Chase Moss, LRT/CTRS          Chase Moss 12/16/2017 12:09 PM 

## 2017-12-16 NOTE — Progress Notes (Signed)
[facility]  12/16/2017 10:49 AM Chase DowGregory Moss  MRN:  [id number] Subjective:   51 y.o Caucasian male, divorced, lives at a rooming house, on SSID. Background history of MDD, SUD and Bereavement. Presented to the ER via the ambulance. Called himself as he was having lingering suicidal thoughts. Patient had earlier overdosed on 1700 mg of Trazodone and 30 mg of Mirtazapine. He had expressed thoughts of getting drunk by the bridge. He had expressed thoughts of jumping off the bridge once he is intoxicated with alcohol. He has been stressed by the holidays and upcoming anniversary of his sister who passed in January. Routine labs is significant for low potassium. Toxicology is negative,  UDS is positive for THC , BAL<10 mg/dl.  Chart reviewed today. Patient discussed at team today.  Staff reports that he continues to isolate self in his room. He has plans of going back to the boarding house. He engages with staff when approached. He ventilates his suicidal thoughts. He has not been observed to be internally stimulated. He has been tolerating his medications well.   Seen today. Slept better last night. His brother inlaw called him yesterday. He wanted to come visit him. Patient says he felt good to know that someone cares about him. Patient is tolerating recent medication adjustment well. Says he is feeling better. Suicidal thoughts still comes and goes. No intent to act here. Feels he would be better after the holidays. No associated psychosis. No associated mania. No thoughts of violence. No homicidal thoughts.  Encouraged.   Principal Problem: MDD (major depressive disorder) Diagnosis:   Patient Active Problem List   Diagnosis Date Noted  . MDD (major depressive disorder) [F32.9] 12/11/2017  . Intentional overdose of drug in tablet form (HCC) [T50.902A] 11/08/2017  . Dyslipidemia [E78.5] 11/08/2017  . Constipation [K59.00] 11/08/2017  . Drug overdose [T50.901A] 11/05/2017  . Acute  alcoholic intoxication without complication (HCC) [F10.929]   . Suicidal ideation [R45.851]   . Alcohol intoxication (HCC) [F10.929] 10/22/2017  . Alcohol withdrawal (HCC) [F10.239] 10/22/2017  . Polysubstance abuse (HCC) [F19.10]   . 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 Cocaine use disorder, moderate, dependence (HCC) [F14.20] 03/31/2017  . HTN (hypertension) [I10] 06/20/2016  . MDD (major depressive disorder), recurrent severe, without psychosis (HCC) [F33.2] 06/20/2016  . Tobacco use disorder [F17.200] 06/20/2016  . Trauma [T14.90XA] 07/27/2015  . Cannabis use disorder, moderate, dependence (HCC) [F12.20]   . PTSD (post-traumatic stress disorder) [F43.10]   . Substance induced mood disorder (HCC) [F19.94] 07/07/2014  . Alcohol use disorder, moderate, dependence (HCC) [F10.20] 04/10/2012  . Suicidal thoughts [R45.851] 04/04/2012   Total Time spent with patient: 20 minutes  Past Psychiatric History: As in H&P  Past Medical History:  Past Medical History:  Diagnosis Date  . Bipolar 1 disorder (HCC)   . Depression   . Hypertension   . PTSD (post-traumatic stress disorder)    History reviewed. No pertinent surgical history. Family History:  Family History  Problem Relation Age of Onset  . Heart attack Other    Family Psychiatric  History: As in H&P Social History:  Social History   Substance and Sexual Activity  Alcohol Use Yes  . Alcohol/week: 1.2 oz  . Types: 2 Cans of beer per week   Comment: daily alcohol     Social History   Substance and Sexual Activity  Drug Use Yes  . Types: Cocaine, Marijuana   Comment: several times a day- pt  reports hasn't used since detoxic     Social History   Socioeconomic History  . Marital status: Divorced    Spouse name: None  . Number of children: None  . Years of education: None  . Highest education level: None  Social Needs  . Financial resource strain: None  .  Food insecurity - worry: None  . Food insecurity - inability: None  . Transportation needs - medical: None  . Transportation needs - non-medical: None  Occupational History  . None  Tobacco Use  . Smoking status: Current Every Day Smoker    Packs/day: 1.00    Years: 30.00    Pack years: 30.00    Types: Cigarettes  . Smokeless tobacco: Never Used  Substance and Sexual Activity  . Alcohol use: Yes    Alcohol/week: 1.2 oz    Types: 2 Cans of beer per week    Comment: daily alcohol  . Drug use: Yes    Types: Cocaine, Marijuana    Comment: several times a day- pt reports hasn't used since detoxic   . Sexual activity: Yes  Other Topics Concern  . None  Social History Narrative  . None   Additional Social History:                         Sleep: Good  Appetite:  Good  Current Medications: Current Facility-Administered Medications  Medication Dose Route Frequency Provider Last Rate Last Dose  . acetaminophen (TYLENOL) tablet 650 mg  650 mg Oral Q6H PRN Oneta RackLewis, Tanika N, NP      . alum & mag hydroxide-simeth (MAALOX/MYLANTA) 200-200-20 MG/5ML suspension 30 mL  30 mL Oral Q4H PRN Oneta RackLewis, Tanika N, NP      . buPROPion (WELLBUTRIN XL) 24 hr tablet 450 mg  450 mg Oral Daily Marley Charlot, Delight OvensVincent A, MD   450 mg at 12/16/17 0747  . chlorproMAZINE (THORAZINE) tablet 50 mg  50 mg Oral QHS Gladyce Mcray, Delight OvensVincent A, MD   50 mg at 12/15/17 2301  . hydrOXYzine (ATARAX/VISTARIL) tablet 25 mg  25 mg Oral TID PRN Oneta RackLewis, Tanika N, NP   25 mg at 12/15/17 2301  . hydrOXYzine (ATARAX/VISTARIL) tablet 50 mg  50 mg Oral QHS PRN Nira ConnBerry, Jason A, NP   50 mg at 12/15/17 2300  . lisinopril (PRINIVIL,ZESTRIL) tablet 5 mg  5 mg Oral Daily Oneta RackLewis, Tanika N, NP   5 mg at 12/16/17 0747  . magnesium hydroxide (MILK OF MAGNESIA) suspension 30 mL  30 mL Oral Daily PRN Oneta RackLewis, Tanika N, NP      . mirtazapine (REMERON) tablet 15 mg  15 mg Oral QHS [place] Rico, Delight OvensVincent A, MD   15 mg at 12/15/17 2301  . traZODone (DESYREL)  tablet 150 mg  150 mg Oral QHS PRN Nira ConnBerry, Jason A, NP   150 mg at 12/15/17 2301    Lab Results: No results found for this or any previous visit (from the past 48 hour(s)).  Blood Alcohol level:  Lab Results  Component Value Date   ETH <10 12/11/2017   ETH 190 (H) 11/05/2017    Metabolic Disorder Labs: Lab Results  Component Value Date   HGBA1C 5.2 06/21/2016   MPG 100 04/09/2012   Lab Results  Component Value Date   PROLACTIN 21.0 (H) 06/21/2016   Lab Results  Component Value Date   CHOL 152 11/07/2017   TRIG 142 11/07/2017   HDL 40 (L) 11/07/2017   CHOLHDL 3.8 11/07/2017  VLDL 28 11/07/2017   LDLCALC 84 11/07/2017   LDLCALC 88 06/21/2016    Physical Findings: AIMS: Facial and Oral Movements Muscles of Facial Expression: None, normal Lips and Perioral Area: None, normal Jaw: None, normal Tongue: None, normal,Extremity Movements Upper (arms, wrists, hands, fingers): None, normal Lower (legs, knees, ankles, toes): None, normal, Trunk Movements Neck, shoulders, hips: None, normal, Overall Severity Severity of abnormal movements (highest score from questions above): None, normal Incapacitation due to abnormal movements: None, normal Patient's awareness of abnormal movements (rate only patient's report): No Awareness, Dental Status Current problems with teeth and/or dentures?: No Does patient usually wear dentures?: No  CIWA:  CIWA-Ar Total: 1 COWS:  COWS Total Score: 1  Musculoskeletal: Strength & Muscle Tone: within normal limits Gait & Station: normal Patient leans: N/A  Psychiatric Specialty Exam: Physical Exam  Constitutional: He appears well-developed and well-nourished.  HENT:  Head: Normocephalic and atraumatic.  Respiratory: Effort normal.  Neurological: He is alert.  Psychiatric:  As above.     ROS  Blood pressure 121/78, pulse 78, temperature 98.4 F (36.9 C), resp. rate 18, height 5\' 8"  (1.727 m), weight 78.2 kg (172 lb 8 oz).Body mass index  is 26.23 kg/m.  General Appearance: In bed this morning. Not in any distress. Good relatedness.   Eye Contact:  Good  Speech:  Normal rate and tone  Volume:  Normal  Mood: marginally better  Affect:  Mobilized some positive affect today.   Thought Process:  Linear  Orientation:  Full (Time, Place, and Person)  Thought Content:  Less negative ruminations. No delusional theme. No preoccupation with violent thoughts. No hallucination in any modality.   Suicidal Thoughts:  No suicidal thoughts today.   Homicidal Thoughts:  No  Memory:  WNL  Judgement:  Better  Insight:  Good  Psychomotor Activity:  Decreased   Concentration:  Concentration: Good and Attention Span: Good  Recall:  Good  Fund of Knowledge:  Fair  Language:  Good  Akathisia:  Negative  Handed:    AIMS (if indicated):     Assets:  Communication Skills Desire for Improvement Physical Health Resilience  ADL's:  Intact  Cognition:  WNL  Sleep:  Number of Hours: 5.25     Treatment Plan Summary: Reactive depression. Patient is gradually improving. Hopelessness and worthlessness perpetuated by the holidays. Suicidal thoughts are waning. Hopeful discharge after the holiday.   Psychiatric: MDD Recurrent Grief SUD  Medical: HTN  Psychosocial:  Limited support Unemployed.   PLAN: 1. Continue medications at current dose 2. Encourage unit groups and therapeutic activities 3. Continue to monitor mood, behavior and interaction with peers.    Georgiann Cocker, MD 12/16/2017, 10:49 AMPatient ID: Chase Moss, male   DOB: 26-Jun-1966, 51 y.o.   MRN: [id number] Patient ID: Alamin Mccuiston, male   DOB: [birth year], 51 y.o.   MRN: [id number]

## 2017-12-16 NOTE — Progress Notes (Addendum)
  DATA ACTION RESPONSE  Objective- Pt. is visible in the dayroom, seen interacting with peers.Presents with a depressed affect and mood. Pt states current sleep regimen is improving.  Subjective- Denies having any SI/HI/AVH/Pain at this time.Is cooperative and remains safe on the unit.  1:1 interaction in private to establish rapport. Encouragement, education, & support given from staff.  PRN vistaril and trazodone requested and will re-eval accordingly.   Safety maintained with Q 15 checks. Continue with POC.

## 2017-12-16 NOTE — Progress Notes (Signed)
D: Patient states he slept "better" last night.  He continues to have vivid nightmares that wake him up, however, he was able to go back to sleep.  Patient reports suicidal ideation without a specific plan.  He rates his depression as an 8; hopelessness as a 7; anxiety as a 6.  His affect is flat; he is pleasant upon approach.  Patient plans to return to his boarding house upon discharge, stating, "it is a roof over my head."  His goal today is to "stop suicidal ideation."    A: Continue to monitor medication management and MD orders.  Safety checks continued every 15 minutes per protocol.  Offer support and encouragement as needed.  R: Patient is receptive to staff; his behavior is appropriate.

## 2017-12-17 DIAGNOSIS — F17210 Nicotine dependence, cigarettes, uncomplicated: Secondary | ICD-10-CM

## 2017-12-17 DIAGNOSIS — F1210 Cannabis abuse, uncomplicated: Secondary | ICD-10-CM

## 2017-12-17 DIAGNOSIS — F1910 Other psychoactive substance abuse, uncomplicated: Secondary | ICD-10-CM

## 2017-12-17 DIAGNOSIS — F1410 Cocaine abuse, uncomplicated: Secondary | ICD-10-CM

## 2017-12-17 DIAGNOSIS — F332 Major depressive disorder, recurrent severe without psychotic features: Principal | ICD-10-CM

## 2017-12-17 DIAGNOSIS — G4700 Insomnia, unspecified: Secondary | ICD-10-CM

## 2017-12-17 MED ORDER — TRAZODONE HCL 150 MG PO TABS: 150.0000 mg | ORAL_TABLET | Freq: Every evening | ORAL | 0 refills | Status: DC | PRN

## 2017-12-17 MED ORDER — MIRTAZAPINE 15 MG PO TABS: 15.0000 mg | ORAL_TABLET | Freq: Every day | ORAL | 0 refills | Status: DC

## 2017-12-17 MED ORDER — BUPROPION HCL ER (XL) 450 MG PO TB24: 450.0000 mg | ORAL_TABLET | Freq: Every day | ORAL | 0 refills | Status: DC

## 2017-12-17 MED ORDER — LISINOPRIL 5 MG PO TABS: 5.0000 mg | ORAL_TABLET | Freq: Every day | ORAL | 0 refills | Status: DC

## 2017-12-17 MED ORDER — CHLORPROMAZINE HCL 50 MG PO TABS: 50.0000 mg | ORAL_TABLET | Freq: Every day | ORAL | 0 refills | Status: DC

## 2017-12-17 MED ORDER — HYDROXYZINE HCL 25 MG PO TABS: ORAL_TABLET | ORAL | 0 refills | Status: DC

## 2017-12-17 NOTE — [facility] Suicide Risk Assessment (Signed)
[facility]   Principal Problem: MDD (major depressive disorder) Discharge Diagnoses:  Patient Active Problem List   Diagnosis Date Noted  . MDD (major depressive disorder) [F32.9] 12/11/2017  . Intentional overdose of drug in tablet form (HCC) [T50.902A] 11/08/2017  . Dyslipidemia [E78.5] 11/08/2017  . Constipation [K59.00] 11/08/2017  . Drug overdose [T50.901A] 11/05/2017  . Acute alcoholic intoxication without complication (HCC) [F10.929]   . Suicidal ideation [R45.851]   . Alcohol intoxication (HCC) [F10.929] 10/22/2017  . Alcohol withdrawal (HCC) [F10.239] 10/22/2017  . Polysubstance abuse (HCC) [F19.10]   . 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 Cocaine use disorder, moderate, dependence (HCC) [F14.20] 03/31/2017  . HTN (hypertension) [I10] 06/20/2016  . MDD (major depressive disorder), recurrent severe, without psychosis (HCC) [F33.2] 06/20/2016  . Tobacco use disorder [F17.200] 06/20/2016  . Trauma [T14.90XA] 07/27/2015  . Cannabis use disorder, moderate, dependence (HCC) [F12.20]   . PTSD (post-traumatic stress disorder) [F43.10]   . Substance induced mood disorder (HCC) [F19.94] 07/07/2014  . Alcohol use disorder, moderate, dependence (HCC) [F10.20] 04/10/2012  . Suicidal thoughts [R45.851] 04/04/2012    Total Time spent with patient: 30 minutes  Musculoskeletal: Strength & Muscle Tone: within normal limits Gait & Station: normal Patient leans: N/A  Psychiatric Specialty Exam: ROS denies headache, no chest pain, no shortness of breath, no vomiting, no diarrhea, no fever   Blood pressure 119/76, pulse 72, temperature (!) 97.5 F (36.4 C), resp. rate 18, height 5\' 8"  (1.727 m), weight 78.2 kg (172 lb 8 oz).Body mass index is 26.23 kg/m.  General Appearance: Well Groomed  Eye Contact::  Good  Speech:  Normal Rate409  Volume:  Normal  Mood:  reports he is feeling better, currently  denies depression , states mood is 10/10  Affect:  Appropriate and reactive   Thought Process:  Linear and Descriptions of Associations: Intact  Orientation:  Full (Time, Place, and Person)  Thought Content:  denies hallucinations, no delusions, not internally preoccupied   Suicidal Thoughts:  No denies any suicidal or self injurious ideations, denies any violent or homicidal ideations  Homicidal Thoughts:  No  Memory:  recent and remote grossly intact   Judgement:  Other:  improved   Insight:  improving  Psychomotor Activity:  Normal  Concentration:  Good  Recall:  Good  Fund of Knowledge:Good  Language: Good  Akathisia:  Negative  Handed:  Right  AIMS (if indicated):   no abnormal or involuntary movements noted or reported   Assets:  Communication Skills Desire for Improvement Resilience  Sleep:  Number of Hours: 5.75  Cognition: WNL  ADL's:  Intact   Mental Status Per Nursing Assessment::   On Admission:  NA  Demographic Factors:  51 year old, divorced, has an adult daughter, on disability, works temp job at times , denies legal issues   Loss Factors: Reports holidays have been difficult period of time for him.   Historical Factors: History of depression, history of alcohol abuse , prior psychiatric admissions . In the past has been diagnosed with MDD and PTSD   Risk Reduction Factors:   Positive coping skills or problem solving skills  Continued Clinical Symptoms:  Patient is alert, attentive, well related, calm, speech is normal, mood is " all right", denies feeling depressed, mood is stable,  affect is reactive, appropriate, no thought disorder , no suicidal or self injurious ideations, no homicidal or violent ideations, no hallucinations, no delusions, future oriented. 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 Reports nightmares  have decreased and is sleeping better.  Behavior on unit in good control, visible in day room. Denies medication side effects at this time- we have discussed medication side effects to include risk of sedation, orthostasis/dizziness , movement disorder, and risk of seizure on Wellbutrin- of note, denies any history of seizures.   Cognitive Features That Contribute To Risk:  No gross cognitive deficits noted upon discharge. Is alert , attentive, and oriented x 3    Suicide Risk:  Mild:  Suicidal ideation of limited frequency, intensity, duration, and specificity.  There are no identifiable plans, no associated intent, mild dysphoria and related symptoms, good self-control (both objective and subjective assessment), few other risk factors, and identifiable protective factors, including available and accessible social support.  Follow-up Information    [facility] up.   Specialty:  Behavioral Health Why:  Scheduling office closed due to holiday. Please walk in within 3 days of hospital discharge to be assessed for outpatient mental health services including medication management and therapy. Walk in hours: Mon-Fri 8am-9am. Thank you.  Contact information: [address] [place] [address] [phone]           Plan Of Care/Follow-up recommendations:  Activity:  as tolerated  Diet:  Regular  Tests:  NA Other:  See below   Patient is expressing desire to discharge and there are no current grounds for involuntary commitment  Patient states brother in law is picking him up later today, plans to go live with brother in law Follow up as above  He has an established PCP , Dr. Anderson MaltaVanzu for medical issues as needed .   Craige CottaFernando A Cobos, MD 12/17/2017, 12:06 PM

## 2017-12-17 NOTE — Progress Notes (Signed)
Patient ID: Chase DowGregory Cunanan, male   DOB: 06/25/1966, 51 y.o.   MRN: [id number] Pt discharged at this time. Denies SI/HI, discharge instructions provided and pt verbalized understanding. Prescriptions and supply of medications provided and all belongings were returned. [place] left ambulatory and in no acute distress.

## 2017-12-17 NOTE — Progress Notes (Signed)
Data. Patient denies SI/HI/AVH. Verbally contracts for safety on the unit and to come to staff before acting of any self harm thoughts/feelings. Patient interacting well with staff and other patients. Affect is blunt, but patient assures nurse, "I am ready for discharge. I am at a place emotionally where I am safe and can use my coping skills."  Action. Emotional support and encouragement offered. Education provided on medication, indications and side effect. Q 15 minute checks done for safety. Response. Safety on the unit maintained through 15 minute checks.  Medications taken as prescribed. Remained calm and appropriate through out shift.

## 2017-12-17 NOTE — Discharge Summary (Signed)
Physician Discharge Summary Note  Patient:  Chase Moss is an 51 y.o., male  MRN:  [id number]  DOB:  Sep 18, 1966  Patient phone:  [phone] (home)   Patient address:   [address] [place] [address],   Total Time spent with patient: Greater than 30 minutes  Date of Admission:  12/11/2017  Date of Discharge: 12-17-17  Reason for Admission: Worsening symptoms of depression, alcohol intoxication & suicidal ideations with plans to jump off a bridge.   Principal Problem: MDD (major depressive disorder)  Discharge Diagnoses: Patient Active Problem List   Diagnosis Date Noted  . MDD (major depressive disorder) [F32.9] 12/11/2017  . Intentional overdose of drug in tablet form (HCC) [T50.902A] 11/08/2017  . Dyslipidemia [E78.5] 11/08/2017  . Constipation [K59.00] 11/08/2017  . Drug overdose [T50.901A] 11/05/2017  . Acute alcoholic intoxication without complication (HCC) [F10.929]   . Suicidal ideation [R45.851]   . Alcohol intoxication (HCC) [F10.929] 10/22/2017  . Alcohol withdrawal (HCC) [F10.239] 10/22/2017  . Polysubstance abuse (HCC) [F19.10]   . 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 Cocaine use disorder, moderate, dependence (HCC) [F14.20] 03/31/2017  . HTN (hypertension) [I10] 06/20/2016  . MDD (major depressive disorder), recurrent severe, without psychosis (HCC) [F33.2] 06/20/2016  . Tobacco use disorder [F17.200] 06/20/2016  . Trauma [T14.90XA] 07/27/2015  . Cannabis use disorder, moderate, dependence (HCC) [F12.20]   . PTSD (post-traumatic stress disorder) [F43.10]   . Substance induced mood disorder (HCC) [F19.94] 07/07/2014  . Alcohol use disorder, moderate, dependence (HCC) [F10.20] 04/10/2012  . Suicidal thoughts [R45.851] 04/04/2012   Past Psychiatric History: Hx. Polysubstance dependence, PTSD, MDD  Past Medical History:  Past Medical History:  Diagnosis Date  . Bipolar 1 disorder  (HCC)   . Depression   . Hypertension   . PTSD (post-traumatic stress disorder)    History reviewed. No pertinent surgical history.  Family History:  Family History  Problem Relation Age of Onset  . Heart attack Other    Family Psychiatric  History: See H&P  Social History:  Social History   Substance and Sexual Activity  Alcohol Use Yes  . Alcohol/week: 1.2 oz  . Types: 2 Cans of beer per week   Comment: daily alcohol     Social History   Substance and Sexual Activity  Drug Use Yes  . Types: Cocaine, Marijuana   Comment: several times a day- pt reports hasn't used since detoxic     Social History   Socioeconomic History  . Marital status: Divorced    Spouse name: None  . Number of children: None  . Years of education: None  . Highest education level: None  Social Needs  . Financial resource strain: None  . Food insecurity - worry: None  . Food insecurity - inability: None  . Transportation needs - medical: None  . Transportation needs - non-medical: None  Occupational History  . None  Tobacco Use  . Smoking status: Current Every Day Smoker    Packs/day: 1.00    Years: 30.00    Pack years: 30.00    Types: Cigarettes  . Smokeless tobacco: Never Used  Substance and Sexual Activity  . Alcohol use: Yes    Alcohol/week: 1.2 oz    Types: 2 Cans of beer per week    Comment: daily alcohol  . Drug use: Yes    Types: Cocaine, Marijuana    Comment: several times a day- pt reports hasn't used since detoxic   .  Sexual activity: Yes  Other Topics Concern  . None  Social History Narrative  . None   Hospital Course: 51 y.o Caucasian male, divorced, [place] rooming house, [place]. Background history of MDD, SUD and Bereavement. Presented to the ER via the ambulance. Called himself as he was having lingering suicidal thoughts. Patient had earlier overdosed on 1700 mg of Trazodone and 30 mg of Mirtazapine. He had expressed thoughts of getting drunk by the bridge. He  had expressed thoughts of jumping off the bridge once he is intoxicated with alcohol. He has been stressed by the holidays and upcoming anniversary of his sister who passed in January. Routine labs is significant for low potassium. Toxicology is negative,  UDS is positive for THC , BAL<10 mg/dl.  After the above admission assessment, Chase Moss was started on the medication regimen for his presenting symptoms. He received & was discharged on; Thorazine 50 mg for mood control, Wellbutrin XL 450 mg for depression,  Hydroxyzine 25 mg & 50 mg respectively prn for anxiety, Mirtazapine 15 mg for depression/insomnia & Trazodone 150 mg for insomnia. He was also enrolled in the group counseling sessions being offered & held on this unit. He learned coping skills. He also received other medication regimen for the other medical issues presented. He tolerated his treatment regimen without any adverse effects or reactions reported.  Chase Moss is seen by his attending psychiatrist today. He is pleased that he sought help. Says he is tolerating his medications well. No withdrawal symptoms. No craving for substances. He is no longer feeling depressed. Describes normal energy and ability to think. Able to focus on task. No suicidal thoughts. No homicidal thoughts. No thoughts of violence. Patient reports normal biological functions.   The nursing staff reports that patient has been appropriate on the unit. Patient has been interacting well with peers & staff. No behavioral issues. Patient has not voiced any suicidal thoughts. Patient has not been observed to be internally stimulated or pre-occupied. Patient has been adherent to his treatment recommendations. Patient has been tolerating their medication well, denies any adverse reactions or side effects.   Percival's case was discussed at the team meeting this morning. The team members feel that patient is back to hisbaseline level of function. Team agrees with plan to  discharge patient today. Upon discharge, Chase Moss adamantly denies any SIHI, AVH, delusional thoughts, paranoia or substance withdrawal symptoms. He will continue further psychiatric follow-up care/medication management on an outpatient basis as noted below. He was provided with all the necessary information needed to make this appointment without problems. [facility] belongings in no apparent distress. Transportation per brother.  Physical Findings: AIMS: Facial and Oral Movements Muscles of Facial Expression: None, normal Lips and Perioral Area: None, normal Jaw: None, normal Tongue: None, normal,Extremity Movements Upper (arms, wrists, hands, fingers): None, normal Lower (legs, knees, ankles, toes): None, normal, Trunk Movements Neck, shoulders, hips: None, normal, Overall Severity Severity of abnormal movements (highest score from questions above): None, normal Incapacitation due to abnormal movements: None, normal Patient's awareness of abnormal movements (rate only patient's report): No Awareness, Dental Status Current problems with teeth and/or dentures?: No Does patient usually wear dentures?: No  CIWA:  CIWA-Ar Total: 1 COWS:  COWS Total Score: 1  Musculoskeletal: Strength & Muscle Tone: within normal limits Gait & Station: normal Patient leans: N/A  Psychiatric Specialty Exam: Physical Exam  Constitutional: He appears well-developed.  HENT:  Head: Normocephalic.  Eyes: Pupils are equal, round, and  reactive to light.  Neck: Normal range of motion.  Cardiovascular: Normal rate.  Respiratory: Effort normal.  GI: Soft.  Genitourinary:  Genitourinary Comments: Deferred  Musculoskeletal: Normal range of motion.  Neurological: He is alert.  Skin: Skin is warm.    Review of Systems  Constitutional: Negative.   HENT: Negative.   Eyes: Negative.   Respiratory: Negative.   Cardiovascular: Negative.   Gastrointestinal: Negative.   Genitourinary:  Negative.   Musculoskeletal: Negative.   Skin: Negative.   Endo/Heme/Allergies: Negative.   Psychiatric/Behavioral: Positive for depression (Stable) and substance abuse (Hx. polysubstance dependence). Negative for hallucinations, memory loss and suicidal ideas. The patient has insomnia (Stable). The patient is not nervous/anxious.     Blood pressure 119/76, pulse 72, temperature (!) 97.5 F (36.4 C), resp. rate 18, height 5\' 8"  (1.727 m), weight 78.2 kg (172 lb 8 oz).Body mass index is 26.23 kg/m.  See Md's SRA   Have you used any form of tobacco in the last 30 days? (Cigarettes, Smokeless Tobacco, Cigars, and/or Pipes): Yes  Has this patient used any form of tobacco in the last 30 days? (Cigarettes, Smokeless Tobacco, Cigars, and/or Pipes):  No  Blood Alcohol level:  Lab Results  Component Value Date   ETH <10 12/11/2017   ETH 190 (H) 11/05/2017   Metabolic Disorder Labs:  Lab Results  Component Value Date   HGBA1C 5.2 06/21/2016   MPG 100 04/09/2012   Lab Results  Component Value Date   PROLACTIN 21.0 (H) 06/21/2016   Lab Results  Component Value Date   CHOL 152 11/07/2017   TRIG 142 11/07/2017   HDL 40 (L) 11/07/2017   CHOLHDL 3.8 11/07/2017   VLDL 28 11/07/2017   LDLCALC 84 11/07/2017   LDLCALC 88 06/21/2016   See Psychiatric Specialty Exam and Suicide Risk Assessment completed by Attending Physician prior to discharge.  Discharge destination:  Home  Is patient on multiple antipsychotic therapies at discharge:  No   Has Patient had three or more failed trials of antipsychotic monotherapy by history:  No  Recommended Plan for Multiple Antipsychotic Therapies: NA  Allergies as of 12/17/2017   No Known Allergies     Medication List    STOP taking these medications   ARIPiprazole 5 MG tablet Commonly known as:  ABILIFY   atenolol 50 MG tablet Commonly known as:  TENORMIN   folic acid 1 MG tablet Commonly known as:  FOLVITE   multivitamin with minerals  Tabs tablet   pantoprazole 40 MG tablet Commonly known as:  PROTONIX   pravastatin 40 MG tablet Commonly known as:  PRAVACHOL   prazosin 2 MG capsule Commonly known as:  MINIPRESS   thiamine 100 MG tablet     TAKE these medications     Indication  BuPROPion HCl ER (XL) 450 MG Tb24 Take 450 mg by mouth daily. For depression What changed:    medication strength  how much to take  Indication:  Major Depressive Disorder   chlorproMAZINE 50 MG tablet Commonly known as:  THORAZINE Take 1 tablet (50 mg total) by mouth at bedtime. For mood control  Indication:  Mood control   hydrOXYzine 25 MG tablet Commonly known as:  ATARAX/VISTARIL Take 1 tablet (25 mg) by mouth three times daily & 2 tablets (50 mg) at bedtime: For anxiety/sleep  Indication:  Feeling Anxious   lisinopril 5 MG tablet Commonly known as:  PRINIVIL,ZESTRIL Take 1 tablet (5 mg total) by mouth daily. For high blood pressure What  changed:  additional instructions  Indication:  High Blood Pressure Disorder   mirtazapine 15 MG tablet Commonly known as:  REMERON Take 1 tablet (15 mg total) by mouth at bedtime. For depression/sleep  Indication:  Major Depressive Disorder, Sleep   traZODone 150 MG tablet Commonly known as:  DESYREL Take 1 tablet (150 mg total) by mouth at bedtime as needed for sleep. What changed:    medication strength  how much to take  when to take this  reasons to take this  Indication:  Trouble Sleeping      Follow-up Information    [facility].   Specialty:  Behavioral Health Why:  Scheduling office closed due to holiday. Please walk in within 3 days of hospital discharge to be assessed for outpatient mental health services including medication management and therapy. Walk in hours: Mon-Fri 8am-9am. Thank you.  Contact informationElpidio Eric: [address] [place] [address] [phone]          Follow-up recommendations: Activity:  As tolerated Diet: As recommended  by your primary care doctor. Keep all scheduled follow-up appointments as recommended.    Comments: Patient is instructed prior to discharge to: Take all medications as prescribed by his/her mental healthcare provider. Report any adverse effects and or reactions from the medicines to his/her outpatient provider promptly. Patient has been instructed & cautioned: To not engage in alcohol and or illegal drug use while on prescription medicines. In the event of worsening symptoms, patient is instructed to call the crisis hotline, 911 and or go to the nearest ED for appropriate evaluation and treatment of symptoms. To follow-up with his/her primary care provider for your other medical issues, concerns and or health care needs.   Signed: Armandina StammerAgnes Nwoko, NP, PMHNP, FNP-BC 12/18/2017, 2:01 PM Patient seen, Suicide Assessment Completed.  Disposition Plan Reviewed

## 2018-02-10 ENCOUNTER — Other Ambulatory Visit: Payer: Self-pay

## 2018-02-10 ENCOUNTER — Emergency Department (HOSPITAL_COMMUNITY)
Admission: EM | Admit: 2018-02-10 | Discharge: 2018-02-11 | Disposition: A | Discharge status: Other Acute Inpatient Hospital | Payer: Medicaid Other | Attending: Emergency Medicine | Admitting: Emergency Medicine

## 2018-02-10 ENCOUNTER — Encounter (HOSPITAL_COMMUNITY): Payer: Self-pay

## 2018-02-10 DIAGNOSIS — F17210 Nicotine dependence, cigarettes, uncomplicated: Secondary | ICD-10-CM | POA: Insufficient documentation

## 2018-02-10 DIAGNOSIS — I10 Essential (primary) hypertension: Secondary | ICD-10-CM | POA: Insufficient documentation

## 2018-02-10 DIAGNOSIS — R45851 Suicidal ideations: Secondary | ICD-10-CM

## 2018-02-10 DIAGNOSIS — Z79899 Other long term (current) drug therapy: Secondary | ICD-10-CM | POA: Insufficient documentation

## 2018-02-10 DIAGNOSIS — F1220 Cannabis dependence, uncomplicated: Secondary | ICD-10-CM | POA: Insufficient documentation

## 2018-02-10 DIAGNOSIS — F319 Bipolar disorder, unspecified: Secondary | ICD-10-CM | POA: Insufficient documentation

## 2018-02-10 DIAGNOSIS — F332 Major depressive disorder, recurrent severe without psychotic features: Secondary | ICD-10-CM | POA: Diagnosis present

## 2018-02-10 DIAGNOSIS — F1020 Alcohol dependence, uncomplicated: Secondary | ICD-10-CM | POA: Insufficient documentation

## 2018-02-10 DIAGNOSIS — R45850 Homicidal ideations: Secondary | ICD-10-CM | POA: Insufficient documentation

## 2018-02-10 LAB — CBC
HEMATOCRIT: 44.9 % (ref 39.0–52.0)
HEMOGLOBIN: 15.7 g/dL (ref 13.0–17.0)
MCH: 31.2 pg (ref 26.0–34.0)
MCHC: 35 g/dL (ref 30.0–36.0)
MCV: 89.3 fL (ref 78.0–100.0)
Platelets: 217 10*3/uL (ref 150–400)
RBC: 5.03 MIL/uL (ref 4.22–5.81)
RDW: 13 % (ref 11.5–15.5)
WBC: 9.3 10*3/uL (ref 4.0–10.5)

## 2018-02-10 LAB — SALICYLATE LEVEL

## 2018-02-10 LAB — COMPREHENSIVE METABOLIC PANEL
ALBUMIN: 4.5 g/dL (ref 3.5–5.0)
ALK PHOS: 76 U/L (ref 38–126)
ALT: 20 U/L (ref 17–63)
AST: 19 U/L (ref 15–41)
Anion gap: 10 (ref 5–15)
BUN: 9 mg/dL (ref 6–20)
CHLORIDE: 104 mmol/L (ref 101–111)
CO2: 23 mmol/L (ref 22–32)
CREATININE: 1.06 mg/dL (ref 0.61–1.24)
Calcium: 9.2 mg/dL (ref 8.9–10.3)
GFR calc non Af Amer: >60 mL/min (ref >60)
GLUCOSE: 92 mg/dL (ref 65–99)
Potassium: 3.5 mmol/L (ref 3.5–5.1)
SODIUM: 137 mmol/L (ref 135–145)
Total Bilirubin: 0.5 mg/dL (ref 0.3–1.2)
Total Protein: 7.5 g/dL (ref 6.5–8.1)

## 2018-02-10 LAB — RAPID URINE DRUG SCREEN, HOSP PERFORMED
Amphetamines: NOT DETECTED
BARBITURATES: NOT DETECTED
Benzodiazepines: NOT DETECTED
Cocaine: POSITIVE — AB
OPIATES: NOT DETECTED
TETRAHYDROCANNABINOL: POSITIVE — AB

## 2018-02-10 LAB — ACETAMINOPHEN LEVEL: Acetaminophen (Tylenol), Serum: <10 ug/mL — ABNORMAL LOW (ref 10–30)

## 2018-02-10 LAB — ETHANOL: Alcohol, Ethyl (B): 127 mg/dL — ABNORMAL HIGH (ref <10)

## 2018-02-10 MED ORDER — TRAZODONE HCL 50 MG PO TABS
150.0000 mg | ORAL_TABLET | Freq: Every evening | ORAL | Status: DC | PRN
  Administered 2018-02-10: 150 mg via ORAL
  Filled 2018-02-10: qty 1

## 2018-02-10 MED ORDER — CHLORPROMAZINE HCL 25 MG PO TABS
50.0000 mg | ORAL_TABLET | Freq: Every day | ORAL | Status: DC
  Administered 2018-02-10: 50 mg via ORAL
  Filled 2018-02-10: qty 2

## 2018-02-10 MED ORDER — LISINOPRIL 5 MG PO TABS
5.0000 mg | ORAL_TABLET | Freq: Every day | ORAL | Status: DC
  Administered 2018-02-11: 5 mg via ORAL
  Filled 2018-02-10: qty 1

## 2018-02-10 MED ORDER — BUPROPION HCL ER (XL) 150 MG PO TB24
450.0000 mg | ORAL_TABLET | Freq: Every day | ORAL | Status: DC
Start: 2018-02-11 — End: 2018-02-11
  Administered 2018-02-11: 450 mg via ORAL
  Filled 2018-02-10: qty 3

## 2018-02-10 MED ORDER — MIRTAZAPINE 30 MG PO TABS
15.0000 mg | ORAL_TABLET | Freq: Every day | ORAL | Status: DC
  Administered 2018-02-10: 15 mg via ORAL
  Filled 2018-02-10: qty 1

## 2018-02-10 NOTE — ED Triage Notes (Signed)
Pt brought in voluntarily with [facility], he states that he's homicidal and suicidal, he states that his sister passed away a few months ago and has been living with his brother in law to help him, he kept wanting him to go buy him drugs and he's tired of that, the brother in law [facility] Pt states he's been off his meds for two months

## 2018-02-10 NOTE — ED Provider Notes (Signed)
[facility]-EMERGENCY DEPT Provider Note   CSN: [id number] Arrival date & time: 02/10/18  2119     History   Chief Complaint Chief Complaint  Patient presents with  . Suicidal  . Homicidal    HPI Fredna DowGregory Udovich is a 52 y.o. male.  52 year old male here with suicidal as well as homicidal ideations.  States that he was evicted from his brother-in-law's house this evening.  He is upset because he hated him rent for the month was given money for food.  He has homicidal ideations towards him because of this.  He has suicidal ideations because he was kicked out.  He admits to drinking 1 40 ounce beer this evening.  Does have a prior history of suicide attempt.  Also has a history of bipolar disorder and PTSD and has not taken his medications for over 2 months.  Denies any intentional ingestions at this time.  Denies responding to internal stimuli.  Denies any auditory or visual hallucinations.      Past Medical History:  Diagnosis Date  . Bipolar 1 disorder (HCC)   . Depression   . Hypertension   . PTSD (post-traumatic stress disorder)     Patient Active Problem List   Diagnosis Date Noted  . MDD (major depressive disorder) 12/11/2017  . Intentional overdose of drug in tablet form (HCC) 11/08/2017  . Dyslipidemia 11/08/2017  . Constipation 11/08/2017  . Drug overdose 11/05/2017  . Acute alcoholic intoxication without complication (HCC)   . Suicidal ideation   . Alcohol intoxication (HCC) 10/22/2017  . Alcohol withdrawal (HCC) 10/22/2017  . Polysubstance abuse (HCC)   . Major depressive disorder, recurrent, severe with psychotic features (HCC) 05/15/2017  . Cocaine use disorder, moderate, dependence (HCC) 03/31/2017  . HTN (hypertension) 06/20/2016  . MDD (major depressive disorder), recurrent severe, without psychosis (HCC) 06/20/2016  . Tobacco use disorder 06/20/2016  . Trauma 07/27/2015  . Cannabis use disorder, moderate, dependence (HCC)   . PTSD  (post-traumatic stress disorder)   . Substance induced mood disorder (HCC) 07/07/2014  . Alcohol use disorder, moderate, dependence (HCC) 04/10/2012  . Suicidal thoughts 04/04/2012    History reviewed. No pertinent surgical history.     Home Medications    Prior to Admission medications   Medication Sig Start Date End Date Taking? Authorizing Provider  buPROPion 450 MG TB24 Take 450 mg by mouth daily. For depression 12/18/17   Armandina StammerNwoko, Agnes I, NP  chlorproMAZINE (THORAZINE) 50 MG tablet Take 1 tablet (50 mg total) by mouth at bedtime. For mood control 12/17/17   Armandina StammerNwoko, Agnes I, NP  hydrOXYzine (ATARAX/VISTARIL) 25 MG tablet Take 1 tablet (25 mg) by mouth three times daily & 2 tablets (50 mg) at bedtime: For anxiety/sleep 12/17/17   Armandina StammerNwoko, Agnes I, NP  lisinopril (PRINIVIL,ZESTRIL) 5 MG tablet Take 1 tablet (5 mg total) by mouth daily. For high blood pressure 12/18/17   Armandina StammerNwoko, Agnes I, NP  mirtazapine (REMERON) 15 MG tablet Take 1 tablet (15 mg total) by mouth at bedtime. For depression/sleep 12/17/17   Armandina StammerNwoko, Agnes I, NP  traZODone (DESYREL) 150 MG tablet Take 1 tablet (150 mg total) by mouth at bedtime as needed for sleep. 12/17/17   Sanjuana KavaNwoko, Agnes I, NP    Family History Family History  Problem Relation Age of Onset  . Heart attack Other     Social History Social History   Tobacco Use  . Smoking status: Current Every Day Smoker    Packs/day: 1.00  Years: 30.00    Pack years: 30.00    Types: Cigarettes  . Smokeless tobacco: Never Used  Substance Use Topics  . Alcohol use: Yes    Alcohol/week: 1.2 oz    Types: 2 Cans of beer per week    Comment: daily alcohol  . Drug use: Yes    Types: Cocaine, Marijuana    Comment: several times a day- pt reports hasn't used since detoxic      Allergies   Patient has no known allergies.   Review of Systems Review of Systems  All other systems reviewed and are negative.    Physical Exam Updated Vital Signs BP (!) 164/110  (BP Location: Left Arm)   Pulse 67   Temp 97.8 F (36.6 C) (Oral)   Resp 20   SpO2 96%   Physical Exam  Constitutional: He is oriented to person, place, and time. He appears well-developed and well-nourished.  Non-toxic appearance. No distress.  HENT:  Head: Normocephalic and atraumatic.  Eyes: Conjunctivae, EOM and lids are normal. Pupils are equal, round, and reactive to light.  Neck: Normal range of motion. Neck supple. No tracheal deviation present. No thyroid mass present.  Cardiovascular: Normal rate, regular rhythm and normal heart sounds. Exam reveals no gallop.  No murmur heard. Pulmonary/Chest: Effort normal and breath sounds normal. No stridor. No respiratory distress. He has no decreased breath sounds. He has no wheezes. He has no rhonchi. He has no rales.  Abdominal: Soft. Normal appearance and bowel sounds are normal. He exhibits no distension. There is no tenderness. There is no rebound and no CVA tenderness.  Musculoskeletal: Normal range of motion. He exhibits no edema or tenderness.  Neurological: He is alert and oriented to person, place, and time. He has normal strength. No cranial nerve deficit or sensory deficit. GCS eye subscore is 4. GCS verbal subscore is 5. GCS motor subscore is 6.  Skin: Skin is warm and dry. No abrasion and no rash noted.  Psychiatric: He has a normal mood and affect. His speech is normal. He is withdrawn. He is not actively hallucinating. He expresses homicidal and suicidal ideation. He expresses suicidal plans and homicidal plans. He is attentive.  Nursing note and vitals reviewed.    ED Treatments / Results  Labs (all labs ordered are listed, but only abnormal results are displayed) Labs Reviewed  COMPREHENSIVE METABOLIC PANEL  ETHANOL  SALICYLATE LEVEL  ACETAMINOPHEN LEVEL  CBC  RAPID URINE DRUG SCREEN, HOSP PERFORMED    EKG  EKG Interpretation None       Radiology No results found.  Procedures Procedures (including  critical care time)  Medications Ordered in ED Medications  chlorproMAZINE (THORAZINE) tablet 50 mg (not administered)  BuPROPion HCl ER (XL) TB24 450 mg (not administered)  lisinopril (PRINIVIL,ZESTRIL) tablet 5 mg (not administered)  mirtazapine (REMERON) tablet 15 mg (not administered)  traZODone (DESYREL) tablet 150 mg (not administered)     Initial Impression / Assessment and Plan / ED Course  I have reviewed the triage vital signs and the nursing notes.  Pertinent labs & imaging results that were available during my care of the patient were reviewed by me and considered in my medical decision making (see chart for details).     Patient to be medically cleared for psychiatric disposition Final Clinical Impressions(s) / ED Diagnoses   Final diagnoses:  None    ED Discharge Orders    None       Lorre Nick, MD 02/10/18 2222

## 2018-02-10 NOTE — ED Notes (Signed)
Bed: [facility] Expected date:  Expected time:  Means of arrival:  Comments: 

## 2018-02-10 NOTE — ED Notes (Signed)
[id number] Expected date:  Expected time:  Means of arrival:  Comments: triage 3

## 2018-02-11 ENCOUNTER — Inpatient Hospital Stay (HOSPITAL_COMMUNITY)
Admission: AD | Admit: 2018-02-11 | Discharge: 2018-02-19 | DRG: 885 | Disposition: A | Discharge status: Home / Self Care | Payer: Medicaid Other | Source: Intra-hospital | Attending: Psychiatry | Admitting: Psychiatry

## 2018-02-11 ENCOUNTER — Encounter (HOSPITAL_COMMUNITY): Payer: Self-pay

## 2018-02-11 DIAGNOSIS — F419 Anxiety disorder, unspecified: Secondary | ICD-10-CM | POA: Diagnosis not present

## 2018-02-11 DIAGNOSIS — R45851 Suicidal ideations: Secondary | ICD-10-CM | POA: Diagnosis present

## 2018-02-11 DIAGNOSIS — F319 Bipolar disorder, unspecified: Secondary | ICD-10-CM | POA: Diagnosis not present

## 2018-02-11 DIAGNOSIS — Z9114 Patient's other noncompliance with medication regimen: Secondary | ICD-10-CM

## 2018-02-11 DIAGNOSIS — E785 Hyperlipidemia, unspecified: Secondary | ICD-10-CM | POA: Diagnosis present

## 2018-02-11 DIAGNOSIS — G4700 Insomnia, unspecified: Secondary | ICD-10-CM | POA: Diagnosis not present

## 2018-02-11 DIAGNOSIS — I10 Essential (primary) hypertension: Secondary | ICD-10-CM | POA: Diagnosis present

## 2018-02-11 DIAGNOSIS — F1420 Cocaine dependence, uncomplicated: Secondary | ICD-10-CM | POA: Diagnosis not present

## 2018-02-11 DIAGNOSIS — Z590 Homelessness: Secondary | ICD-10-CM

## 2018-02-11 DIAGNOSIS — Z818 Family history of other mental and behavioral disorders: Secondary | ICD-10-CM | POA: Diagnosis not present

## 2018-02-11 DIAGNOSIS — Z9149 Other personal history of psychological trauma, not elsewhere classified: Secondary | ICD-10-CM | POA: Diagnosis not present

## 2018-02-11 DIAGNOSIS — F1210 Cannabis abuse, uncomplicated: Secondary | ICD-10-CM | POA: Diagnosis not present

## 2018-02-11 DIAGNOSIS — R45850 Homicidal ideations: Secondary | ICD-10-CM | POA: Diagnosis present

## 2018-02-11 DIAGNOSIS — F1490 Cocaine use, unspecified, uncomplicated: Secondary | ICD-10-CM | POA: Diagnosis not present

## 2018-02-11 DIAGNOSIS — R450 Nervousness: Secondary | ICD-10-CM | POA: Diagnosis not present

## 2018-02-11 DIAGNOSIS — F17210 Nicotine dependence, cigarettes, uncomplicated: Secondary | ICD-10-CM | POA: Diagnosis present

## 2018-02-11 DIAGNOSIS — Z915 Personal history of self-harm: Secondary | ICD-10-CM | POA: Diagnosis not present

## 2018-02-11 DIAGNOSIS — F4310 Post-traumatic stress disorder, unspecified: Secondary | ICD-10-CM | POA: Diagnosis present

## 2018-02-11 DIAGNOSIS — F1290 Cannabis use, unspecified, uncomplicated: Secondary | ICD-10-CM | POA: Diagnosis not present

## 2018-02-11 DIAGNOSIS — F1099 Alcohol use, unspecified with unspecified alcohol-induced disorder: Secondary | ICD-10-CM | POA: Diagnosis not present

## 2018-02-11 MED ORDER — HYDROXYZINE HCL 25 MG PO TABS
25.0000 mg | ORAL_TABLET | Freq: Three times a day (TID) | ORAL | Status: DC | PRN
  Administered 2018-02-11 – 2018-02-12 (×2): 25 mg via ORAL
  Filled 2018-02-11 (×2): qty 1

## 2018-02-11 MED ORDER — ALUM & MAG HYDROXIDE-SIMETH 200-200-20 MG/5ML PO SUSP: 30.0000 mL | ORAL | Status: DC | PRN

## 2018-02-11 MED ORDER — ACETAMINOPHEN 325 MG PO TABS: 650.0000 mg | ORAL_TABLET | Freq: Four times a day (QID) | ORAL | Status: DC | PRN

## 2018-02-11 MED ORDER — TRAZODONE HCL 50 MG PO TABS
50.0000 mg | ORAL_TABLET | Freq: Every evening | ORAL | Status: DC | PRN
  Administered 2018-02-11: 50 mg via ORAL
  Filled 2018-02-11: qty 1

## 2018-02-11 MED ORDER — MAGNESIUM HYDROXIDE 400 MG/5ML PO SUSP: 30.0000 mL | Freq: Every day | ORAL | Status: DC | PRN

## 2018-02-11 NOTE — [facility] Suicide Risk Assessment (Signed)
[facility] INPATIENT:  Family/Significant Other Suicide Prevention Education  Suicide Prevention Education:   Patient Refusal for Family/Significant Other Suicide Prevention Education: The patient Chase Moss has refused to provide written consent for family/significant other to be provided Family/Significant Other Suicide Prevention Education during admission and/or prior to discharge.  Physician notified.  Chase Moss 02/11/2018, 2:00 PM
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
## 2018-02-11 NOTE — Progress Notes (Signed)
Patient ID: Chase Moss, male   DOB: 08/29/1966, 52 y.o.   MRN: [id number] Patient admitted to the unit due to increased depression, suicidal thoughts and homicidal ideations.  Patient's current stressor is the recent death of his sister and the loss of his living arrangements.  Patient states he was living with his sister and brother in law and after his sister died he stayed in the dwelling with his brother in [place].  Recently his brother in law drove him to a shelter and dropped him off letting patient know he could not return to the home.  Patient became homicidal toward brother in law and came to the hospital voluntarily prior to acting on SI or HI.   Safety search and skin assessment complete.  Patient found to be free of all contraband and injury prior to entering the unit. Patient oriented to the unit without incident and is currently resting quietly.

## 2018-02-11 NOTE — ED Notes (Signed)
[facility] on unit to transfer pt to [facility] Adult unit per MD order. Personal property given to [facility] for transfer. Pt signed e-signature. Ambulatory off unit.  

## 2018-02-11 NOTE — BH Assessment (Signed)
[facility] Assessment Progress Note  [facility] can [facility] anytime. Call report to [phone]. Support paperwork completed and faxed to [phone]. Hard copy put in chart. Pt's nurse, Morrie SheldonAshley, notified.   Johny ShockSamantha M. Ladona Ridgelaylor, MS, NCC, LPCA Counselor

## 2018-02-11 NOTE — Progress Notes (Signed)
Recreation Therapy Notes  Animal-Assisted Activity (AAA) Program Checklist/Progress Notes Patient Eligibility Criteria Checklist & Daily Group note for Rec TxIntervention  Date: 2.19.19 Time: 2:45 pm  Location: [address] Dayroom   AAA/T Program Assumption of Risk Form signed by Patient/ or Parent Legal Guardian Yes  Patient is free of allergies or sever asthma Yes  Patient reports no fear of animals Yes  Patient reports no history of cruelty to animals Yes  Patient understands his/her participation is voluntary Yes  Behavioral Response: Patient did not attend   Damieon Armendariz, Recreation Therapy Intern  Mistina Coatney 02/11/2018 12:38 PM 

## 2018-02-11 NOTE — [facility] Suicide Risk Assessment (Signed)
[facility] INPATIENT:  Family/Significant Other Suicide Prevention Education  Suicide Prevention Education:   SPE completed with patient, as patient refused to consent to family contact. SPI pamphlet provided to pt and pt was encouraged to share information with support network, ask questions, and talk about any concerns relating to SPE. Patient denies access to guns/firearms and verbalized understanding of information provided. Mobile Crisis information also provided to patient.   
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
## 2018-02-11 NOTE — [facility] Group Notes (Signed)
[facility]      02/11/2018 2:05 PM  Type of Therapy: Mental Health Association Presentation  Participation Level: Did Not Attend Participation Quality: Attentive  Affect: Appropriate  Cognitive: Oriented  Insight: Developing/Improving  Engagement in Therapy: Engaged  Modes of Intervention: Discussion, Education and Socialization  Summary of Progress/Problems: Invited, chose not to attend.    Alcario DroughtJolan Denese Mentink LCSWA Clinical Social Worker

## 2018-02-11 NOTE — Progress Notes (Signed)
Adult Psychoeducational Group Note  Date:  02/11/2018 Time:  10:13 PM  Group Topic/Focus:  Wrap-Up Group:   The focus of this group is to help patients review their daily goal of treatment and discuss progress on daily workbooks.  Participation Level:  Active  Participation Quality:  Appropriate  Affect:  Appropriate  Cognitive:  Appropriate  Insight: Appropriate  Engagement in Group:  Engaged  Modes of Intervention:  Discussion  Additional Comments:  Patient attended group and participated.   Lynisha Osuch W Sherwood Castilla 02/11/2018, 10:13 PM

## 2018-02-11 NOTE — BH Assessment (Addendum)
[facility] Assessment Progress Note    Clinician was informed by Bunnie Pionori, AC [facility] Jama Flavorsobos.  Patient can come when daytime AC coordinates transfer.  Tori said that patient's BP needs to be looked at carefully.  [facility]

## 2018-02-12 DIAGNOSIS — Z818 Family history of other mental and behavioral disorders: Secondary | ICD-10-CM

## 2018-02-12 DIAGNOSIS — Z915 Personal history of self-harm: Secondary | ICD-10-CM

## 2018-02-12 DIAGNOSIS — R45850 Homicidal ideations: Secondary | ICD-10-CM

## 2018-02-12 DIAGNOSIS — F319 Bipolar disorder, unspecified: Principal | ICD-10-CM

## 2018-02-12 DIAGNOSIS — F1420 Cocaine dependence, uncomplicated: Secondary | ICD-10-CM

## 2018-02-12 DIAGNOSIS — F17210 Nicotine dependence, cigarettes, uncomplicated: Secondary | ICD-10-CM

## 2018-02-12 DIAGNOSIS — R450 Nervousness: Secondary | ICD-10-CM

## 2018-02-12 DIAGNOSIS — Z9149 Other personal history of psychological trauma, not elsewhere classified: Secondary | ICD-10-CM

## 2018-02-12 DIAGNOSIS — F4310 Post-traumatic stress disorder, unspecified: Secondary | ICD-10-CM

## 2018-02-12 DIAGNOSIS — F1210 Cannabis abuse, uncomplicated: Secondary | ICD-10-CM

## 2018-02-12 DIAGNOSIS — R45851 Suicidal ideations: Secondary | ICD-10-CM

## 2018-02-12 DIAGNOSIS — G4700 Insomnia, unspecified: Secondary | ICD-10-CM

## 2018-02-12 DIAGNOSIS — F419 Anxiety disorder, unspecified: Secondary | ICD-10-CM

## 2018-02-12 MED ORDER — IBUPROFEN 800 MG PO TABS: 800.0000 mg | ORAL_TABLET | Freq: Three times a day (TID) | ORAL | Status: DC | PRN

## 2018-02-12 MED ORDER — AMLODIPINE BESYLATE 5 MG PO TABS
5.0000 mg | ORAL_TABLET | Freq: Once | ORAL | Status: AC
  Administered 2018-02-12: 5 mg via ORAL
  Filled 2018-02-12 (×2): qty 1

## 2018-02-12 MED ORDER — CHLORPROMAZINE HCL 50 MG PO TABS
50.0000 mg | ORAL_TABLET | Freq: Every day | ORAL | Status: DC
  Administered 2018-02-12 – 2018-02-13 (×2): 50 mg via ORAL
  Filled 2018-02-12 (×3): qty 1
  Filled 2018-02-12: qty 7

## 2018-02-12 MED ORDER — TRAZODONE HCL 150 MG PO TABS
150.0000 mg | ORAL_TABLET | Freq: Every evening | ORAL | Status: DC | PRN
  Administered 2018-02-12 – 2018-02-16 (×5): 150 mg via ORAL
  Filled 2018-02-12 (×5): qty 1

## 2018-02-12 MED ORDER — MIRTAZAPINE 15 MG PO TABS
15.0000 mg | ORAL_TABLET | Freq: Every day | ORAL | Status: DC
  Administered 2018-02-12 – 2018-02-18 (×7): 15 mg via ORAL
  Filled 2018-02-12 (×7): qty 1
  Filled 2018-02-12: qty 7
  Filled 2018-02-12 (×5): qty 1

## 2018-02-12 MED ORDER — AMLODIPINE BESYLATE 5 MG PO TABS
5.0000 mg | ORAL_TABLET | Freq: Every day | ORAL | Status: DC
  Administered 2018-02-13 – 2018-02-19 (×7): 5 mg via ORAL
  Filled 2018-02-12: qty 1
  Filled 2018-02-12 (×2): qty 7
  Filled 2018-02-12 (×2): qty 1
  Filled 2018-02-12: qty 7
  Filled 2018-02-12: qty 1
  Filled 2018-02-12 (×3): qty 7

## 2018-02-12 MED ORDER — HYDROXYZINE HCL 50 MG PO TABS
50.0000 mg | ORAL_TABLET | Freq: Four times a day (QID) | ORAL | Status: DC | PRN
  Administered 2018-02-12 – 2018-02-16 (×4): 50 mg via ORAL
  Filled 2018-02-12 (×3): qty 1
  Filled 2018-02-12: qty 10
  Filled 2018-02-12: qty 1

## 2018-02-12 MED ORDER — LISINOPRIL 5 MG PO TABS
5.0000 mg | ORAL_TABLET | Freq: Every day | ORAL | Status: DC
  Administered 2018-02-12 – 2018-02-15 (×4): 5 mg via ORAL
  Filled 2018-02-12 (×7): qty 1

## 2018-02-12 NOTE — Plan of Care (Signed)
Nurse discussed depression, anxiety, coping skills with patient.  

## 2018-02-12 NOTE — [facility] Group Notes (Signed)
[facility] Group Notes:  Mindfulness  Date:  02/12/2018  Time:  5:41 PM  Type of Therapy:  Psychoeducational Skills  Participation Level:  Active  Participation Quality:  Appropriate and Attentive  Affect:  Appropriate  Cognitive:  Alert and Appropriate  Insight:  Appropriate and Good  Engagement in Group:  Engaged  Modes of Intervention:  Activity, Discussion and Education  Summary of Progress/Problems: Patient attended group and was fully engaged.   
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
## 2018-02-12 NOTE — [facility] Group Notes (Signed)
LCSW Group Therapy Note 02/12/2018 12:44 PM  Type of Therapy/Topic: Group Therapy: Feelings about Diagnosis  Participation Level: Did Not Attend   Description of Group:  This group will allow patients to explore their thoughts and feelings about diagnoses they have received. Patients will be guided to explore their level of understanding and acceptance of these diagnoses. Facilitator will encourage patients to process their thoughts and feelings about the reactions of others to their diagnosis and will guide patients in identifying ways to discuss their diagnosis with significant others in their lives. This group will be process-oriented, with patients participating in exploration of their own experiences, giving and receiving support, and processing challenge from other group members.  Therapeutic Goals: 1. Patient will demonstrate understanding of diagnosis as evidenced by identifying two or more symptoms of the disorder 2. Patient will be able to express two feelings regarding the diagnosis 3. Patient will demonstrate their ability to communicate their needs through discussion and/or role play  Summary of Patient Progress:  Invited, chose not to attend.   Therapeutic Modalities:  Cognitive Behavioral Therapy Brief Therapy Feelings Identification    Chase Moss LCSWA Clinical Social Worker

## 2018-02-12 NOTE — Tx Team (Signed)
Interdisciplinary Treatment and Diagnostic Plan Update  02/12/2018 Time of Session: 9:30am Chase Moss MRN: [id number]  Principal Diagnosis: <principal problem not specified>  Secondary Diagnoses: Active Problems:   Bipolar I disorder (HCC)   Current Medications:  Current Facility-Administered Medications  Medication Dose Route Frequency Provider Last Rate Last Dose  . acetaminophen (TYLENOL) tablet 650 mg  650 mg Oral Q6H PRN Okonkwo, Justina A, NP      . alum & mag hydroxide-simeth (MAALOX/MYLANTA) 200-200-20 MG/5ML suspension 30 mL  30 mL Oral Q4H PRN Okonkwo, Justina A, NP      . hydrOXYzine (ATARAX/VISTARIL) tablet 25 mg  25 mg Oral TID PRN Lu Duffel, Justina A, NP   25 mg at 02/12/18 0756  . lisinopril (PRINIVIL,ZESTRIL) tablet 5 mg  5 mg Oral Daily Laverle Hobby, PA-C   5 mg at 02/12/18 0755  . magnesium hydroxide (MILK OF MAGNESIA) suspension 30 mL  30 mL Oral Daily PRN Okonkwo, Justina A, NP      . traZODone (DESYREL) tablet 50 mg  50 mg Oral QHS PRN Okonkwo, Justina A, NP   50 mg at 02/11/18 2104   PTA Medications: Medications Prior to Admission  Medication Sig Dispense Refill Last Dose  . buPROPion 450 MG TB24 Take 450 mg by mouth daily. For depression 30 tablet 0 over 2 months at unknown time  . chlorproMAZINE (THORAZINE) 50 MG tablet Take 1 tablet (50 mg total) by mouth at bedtime. For mood control 30 tablet 0 over 2 months at unknown time  . hydrOXYzine (ATARAX/VISTARIL) 25 MG tablet Take 1 tablet (25 mg) by mouth three times daily & 2 tablets (50 mg) at bedtime: For anxiety/sleep 120 tablet 0 over 2 months at unknown time  . lisinopril (PRINIVIL,ZESTRIL) 5 MG tablet Take 1 tablet (5 mg total) by mouth daily. For high blood pressure 30 tablet 0 over 2 months at unknown time  . mirtazapine (REMERON) 15 MG tablet Take 1 tablet (15 mg total) by mouth at bedtime. For depression/sleep 30 tablet 0 over 2 months at unknown time  . traZODone (DESYREL) 150 MG tablet Take 1 tablet  (150 mg total) by mouth at bedtime as needed for sleep. 30 tablet 0 over 2 months at unknown time    Patient Stressors: Financial difficulties Loss of sister last year with whom he was staying Medication change or noncompliance Substance abuse  Patient Strengths: Average or above average intelligence Capable of independent living General fund of knowledge Motivation for treatment/growth  Treatment Modalities: Medication Management, Group therapy, Case management,  1 to 1 session with clinician, Psychoeducation, Recreational therapy.   Physician Treatment Plan for Primary Diagnosis: <principal problem not specified>  Medication Management: Evaluate patient's response, side effects, and tolerance of medication regimen.  Therapeutic Interventions: 1 to 1 sessions, Unit Group sessions and Medication administration.  Evaluation of Outcomes: Not Met  Physician Treatment Plan for Secondary Diagnosis: Active Problems:   Bipolar I disorder ([place])  Medication Management: Evaluate patient's response, side effects, and tolerance of medication regimen.  Therapeutic Interventions: 1 to 1 sessions, Unit Group sessions and Medication administration.  Evaluation of Outcomes: Not Met   RN Treatment Plan for Primary Diagnosis: <principal problem not specified> Long Term Goal(s): Knowledge of disease and therapeutic regimen to maintain health will improve  Short Term Goals: Ability to remain free from injury will improve, Ability to verbalize frustration and anger appropriately will improve, Ability to demonstrate self-control, Ability to participate in decision making will improve, Ability to verbalize feelings will  improve, Ability to disclose and discuss suicidal ideas, Ability to identify and develop effective coping behaviors will improve and Compliance with prescribed medications will improve  Medication Management: RN will administer medications as ordered by provider, will assess and  evaluate patient's response and provide education to patient for prescribed medication. RN will report any adverse and/or side effects to prescribing provider.  Therapeutic Interventions: 1 on 1 counseling sessions, Psychoeducation, Medication administration, Evaluate responses to treatment, Monitor vital signs and CBGs as ordered, Perform/monitor CIWA, COWS, AIMS and Fall Risk screenings as ordered, Perform wound care treatments as ordered.  Evaluation of Outcomes: Not Met   LCSW Treatment Plan for Primary Diagnosis: <principal problem not specified> Long Term Goal(s): Safe transition to appropriate next level of care at discharge, Engage patient in therapeutic group addressing interpersonal concerns.  Short Term Goals: Engage patient in aftercare planning with referrals and resources, Increase social support, Increase ability to appropriately verbalize feelings, Increase emotional regulation, Facilitate acceptance of mental health diagnosis and concerns, Facilitate patient progression through stages of change regarding substance use diagnoses and concerns, Identify triggers associated with mental health/substance abuse issues and Increase skills for wellness and recovery  Therapeutic Interventions: Assess for all discharge needs, 1 to 1 time with Social worker, Explore available resources and support systems, Assess for adequacy in community support network, Educate family and significant other(s) on suicide prevention, Complete Psychosocial Assessment, Interpersonal group therapy.  Evaluation of Outcomes: Not Met   Progress in Treatment: Attending groups: No. Participating in groups: No. Taking medication as prescribed: Yes. Toleration medication: Yes. Family/Significant other contact made: No, will contact:  patient refused consent Patient understands diagnosis: Yes. Discussing patient identified problems/goals with staff: Yes. Medical problems stabilized or resolved: Yes. Denies  suicidal/homicidal ideation: Yes. Issues/concerns per patient self-inventory: No. Other:   New problem(s) identified: None  New Short Term/Long Term Goal(s): medication stabilization, elimination of SI thoughts, development of comprehensive mental wellness plan.   Patient Goal: "To get back on my medications and not be depressed. I also want to get rid of my suicidal and homicidal thoughts"  Discharge Plan or Barriers: Patient plans to discharge to [place] for SA treatment and then follow up with [facility] for outpatient services.   Reason for Continuation of Hospitalization: Anxiety Depression Medication stabilization Suicidal ideation  Estimated Length of Stay: Monday, 02/17/18  Attendees: Patient: Chase Moss 02/12/2018 8:23 AM  Physician: Dr. Neita Garnet 02/12/2018 8:23 AM  Nursing: Rise Paganini, [place] 02/12/2018 8:23 AM  RN Care Manager: 02/12/2018 8:23 AM  Social Worker: Radonna Ricker, Latanya Presser 02/12/2018 8:23 AM  Recreational Therapist:  02/12/2018 8:23 AM  Other:  02/12/2018 8:23 AM  Other:  02/12/2018 8:23 AM  Other: 02/12/2018 8:23 AM    Scribe for Treatment Team: Marylee Floras, [place] 02/12/2018 8:23 AM

## 2018-02-12 NOTE — Progress Notes (Signed)
Pt is new to the unit this afternoon, but [facility].  He reports that he became suicidal after his sister's boyfriend threw him out of the home.  His sister passed away last year, but he had continued to live in the home.  He told Clinical research associatewriter that he had been contributing to the expenses and had just paid the next 2 months rent when he was kicked out and taken to a homeless shelter.  He says that at first he had HI towards the man, but then realized that it was not worth it to do anything, so he went to the hospital to get back on his meds.  He denies SI/HI/AVh at this time.  He says that he has spoken to a relative in [place] who has said he can stay with him, and this relative has a job opportunity for him.  Support and encouragement offered.  Discharge plans are in process.  Safety maintained with q15 minute checks.

## 2018-02-12 NOTE — Progress Notes (Signed)
D:  Patient stated he does have SI thoughts, [facility], contracts for safety.  Does have HI thoughts to brother-in-law.  Denied A/V hallucinations.  Feels tired and sleepy this morning. A:  Medications administered per MD orders.  Emotional support and encouragement given patient. R:  Safety maintained with 15 minute checks.

## 2018-02-12 NOTE — Progress Notes (Signed)
Patient stated he feels very angry, anxious, depressed.  Patient stated he is tired of the way he feels about his situation, and wants to stay in bed and sleep.  Vistaril 50 mg p.o. Given patient.  Safety maintained with 15 minute checks. Since his family passed away, he has felt very lonely, tried to drown his feelings in alcohol, THC, etc.  Does not do heroin and cocaine  Went through all his sister's assets using drugs, alcohol.  Has seen sister's husband go through all his sister's assets and now husband wants to sell the trailer home.   Brother in Social workerlaw uses a lot of heroin since patient's sister passed away.  Sister's ashes are still on the mantel when the [place] were to be [place] and spread near mother's grave but husband has not done this because of the costs.  The house is still in his sister's name.  Brother in law went through $25,000, 2 cars, one motorcycle, which upsets him because this was not his sister's last wishes.

## 2018-02-12 NOTE — Progress Notes (Signed)
Recreation Therapy Notes  Date: 02/12/18 Time: 0930 Location: 300 Hall Dayroom  Group Topic: Stress Management  Goal Area(s) Addresses:  Patient will verbalize importance of using healthy stress management.  Patient will identify positive emotions associated with healthy stress management.   Intervention: Stress Management  Activity :  Forest Visualization.  LRT introduced the stress management technique of guided imagery.  LRT read a script to allow patients to visualize the sights and sounds of being in the forest.  Patients were to follow along as the script was read to participate in activity.  Education: Stress Management, Discharge Planning.   Education Outcome: Acknowledges edcuation/In group clarification offered/Needs additional education  Clinical Observations/Feedback: Pt did not attend group.    Juandiego Kolenovic, LRT/CTRS         Keaton Beichner A 02/12/2018 1:04 PM 

## 2018-02-12 NOTE — Tx Team (Signed)
Initial Treatment Plan 02/12/2018 2:05 AM Chase Moss RUE:[id number]    PATIENT STRESSORS: Financial difficulties Loss of sister last year with whom he was staying Medication change or noncompliance Substance abuse   PATIENT STRENGTHS: Average or above average intelligence Capable of independent living General fund of knowledge Motivation for treatment/growth   PATIENT IDENTIFIED PROBLEMS: Depression  Grief and loss  Risk for self harm and HI toward sister's BF  Substance abuse    "I need help with my depression"  "I need to get back on my medicine"         DISCHARGE CRITERIA:  Adequate post-discharge living arrangements Improved stabilization in mood, thinking, and/or behavior Motivation to continue treatment in a less acute level of care Safe-care adequate arrangements made Verbal commitment to aftercare and medication compliance  PRELIMINARY DISCHARGE PLAN: Attend aftercare/continuing care group Placement in alternative living arrangements  PATIENT/FAMILY INVOLVEMENT: This treatment plan has been presented to and reviewed with the patient, Chase Moss, and/or family member.  The patient and family have been given the opportunity to ask questions and make suggestions.  [name], [name], RN 02/12/2018, 2:05 AM

## 2018-02-13 DIAGNOSIS — F1099 Alcohol use, unspecified with unspecified alcohol-induced disorder: Secondary | ICD-10-CM

## 2018-02-13 DIAGNOSIS — F1490 Cocaine use, unspecified, uncomplicated: Secondary | ICD-10-CM

## 2018-02-13 DIAGNOSIS — F1290 Cannabis use, unspecified, uncomplicated: Secondary | ICD-10-CM

## 2018-02-13 MED ORDER — ARIPIPRAZOLE 5 MG PO TABS
5.0000 mg | ORAL_TABLET | Freq: Every day | ORAL | Status: DC
  Administered 2018-02-13 – 2018-02-14 (×2): 5 mg via ORAL
  Filled 2018-02-13: qty 7
  Filled 2018-02-13 (×3): qty 1

## 2018-02-13 NOTE — Progress Notes (Signed)
Nursing Progress Note 1900-0730  D) Patient presents with anxious mood and rapid, pressured speech. Patient complained of a headache and requested his BP be obtained. BP elevated and PA notified. New orders received. Patient is seen interactive in the milieu. Patient denies SI/HI/AVH. Patient contracts for safety on the unit. Patient compliant with medications and requested Trazodone for sleep.  A) Patient educated about and provided medication as scheduled or requested per provider's orders. Patient safety maintained with q15 min safety checks. Low fall risk precautions in place. Emotional support given. 1:1 interaction and active listening provided. Snacks and fluids provided. Labs, vital signs and patient behavior monitored throughout shift. Patient encouraged to work on treatment plan.  R) Patient remains safe on the unit at this time. Patient agrees to make needs known to staff. Will continue to monitor and assess for changes.

## 2018-02-13 NOTE — Progress Notes (Signed)
Patient ID: Chase DowGregory Moss, male   DOB: 02/03/1966, 52 y.o.   MRN: [id number]  DAR: Pt. Denies HI and A/V Hallucinations. He endorses passive SI, he is able to contract for safety. Patient does report pain in his left big toe but refuses intervention. Support and encouragement provided to the patient however patient is somewhat guarded. Scheduled medications administered to patient but not on time as patient wanted to take them later. Patient is minimal and isolative to his room throughout most of the day. This evening he reports that he feels like Abilify is starting to make him feel better. He received his first dose today. Q15 minute checks are maintained for safety.

## 2018-02-13 NOTE — Plan of Care (Signed)
  Not Progressing Activity: Interest or engagement in activities will improve 02/13/2018 1713 - Not Progressing by Lenord Fellersopson, Luva Metzger Elizabeth, RN Education: Emotional status will improve 02/13/2018 1713 - Not Progressing by Lenord Fellersopson, Bynum Mccullars Elizabeth, RN

## 2018-02-13 NOTE — Progress Notes (Signed)
Pt attended evening wrap up group and stated today was a 5, he was able to get back onto his mood stabilizer and if he could go anywhere he would like to go to [place].

## 2018-02-13 NOTE — Progress Notes (Addendum)
[facility] MD Progress Note  02/13/2018 1:02 PM Chase Moss  MRN:  [id number]   Subjective:  Patient reports that he is doing okay today. He still feels depressed with passive SI "from time to time." He reports still feeling HI towards his brother in law because he has not taken his sister's ashes to [place] to be buried with his mother. He wants to get some distance between them and that would help. He is considering contacting his brother in [place]. He requests to start back on his Abilify.   Objective: Patient's chart and findings reviewed and discussed with treatment team. Patient presents in his bed and has been isolative from others. He is cooperative and pleasant. Will restart Abilify at 5 mg Daily. He has not completely decided on his treatment plan at this moment.   Principal Problem: Bipolar I disorder (HCC) Diagnosis:   Patient Active Problem List   Diagnosis Date Noted  . Cannabis use disorder, mild, abuse [F12.10] 02/12/2018  . Bipolar I disorder (HCC) [F31.9] 02/11/2018  . MDD (major depressive disorder) [F32.9] 12/11/2017  . Intentional overdose of drug in tablet form (HCC) [T50.902A] 11/08/2017  . Dyslipidemia [E78.5] 11/08/2017  . Constipation [K59.00] 11/08/2017  . Drug overdose [T50.901A] 11/05/2017  . Acute alcoholic intoxication without complication (HCC) [F10.929]   . Suicidal ideation [R45.851]   . Alcohol intoxication (HCC) [F10.929] 10/22/2017  . Alcohol withdrawal (HCC) [F10.239] 10/22/2017  . Polysubstance abuse (HCC) [F19.10]   . 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 Cocaine use disorder, moderate, dependence (HCC) [F14.20] 03/31/2017  . HTN (hypertension) [I10] 06/20/2016  . MDD (major depressive disorder), recurrent severe, without psychosis (HCC) [F33.2] 06/20/2016  . Tobacco use disorder [F17.200] 06/20/2016  . Trauma [T14.90XA] 07/27/2015  . Cannabis use disorder, moderate, dependence (HCC) [F12.20]    . PTSD (post-traumatic stress disorder) [F43.10]   . Substance induced mood disorder (HCC) [F19.94] 07/07/2014  . Alcohol use disorder, moderate, dependence (HCC) [F10.20] 04/10/2012  . Suicidal thoughts [R45.851] 04/04/2012   Total Time spent with patient: 30 minutes  Past Psychiatric History: See H&P  Past Medical History:  Past Medical History:  Diagnosis Date  . Bipolar 1 disorder (HCC)   . Depression   . Hypertension   . PTSD (post-traumatic stress disorder)    History reviewed. No pertinent surgical history. Family History:  Family History  Problem Relation Age of Onset  . Heart attack Other    Family Psychiatric  History: See H&P Social History:  Social History   Substance and Sexual Activity  Alcohol Use Yes  . Alcohol/week: 1.2 oz  . Types: 2 Cans of beer per week   Comment: daily alcohol     Social History   Substance and Sexual Activity  Drug Use Yes  . Types: Cocaine, Marijuana   Comment: several times a day- pt reports hasn't used since detoxic     Social History   Socioeconomic History  . Marital status: Divorced    Spouse name: None  . Number of children: None  . Years of education: None  . Highest education level: None  Social Needs  . Financial resource strain: None  . Food insecurity - worry: None  . Food insecurity - inability: None  . Transportation needs - medical: None  . Transportation needs - non-medical: None  Occupational History  . None  Tobacco Use  . Smoking status: Current Every Day Smoker    Packs/day: 1.00    Years: 30.00  Pack years: 30.00    Types: Cigarettes  . Smokeless tobacco: Never Used  Substance and Sexual Activity  . Alcohol use: Yes    Alcohol/week: 1.2 oz    Types: 2 Cans of beer per week    Comment: daily alcohol  . Drug use: Yes    Types: Cocaine, Marijuana    Comment: several times a day- pt reports hasn't used since detoxic   . Sexual activity: Yes  Other Topics Concern  . None  Social History  Narrative  . None   Additional Social History:                         Sleep: Good  Appetite:  Good  Current Medications: Current Facility-Administered Medications  Medication Dose Route Frequency Provider Last Rate Last Dose  . acetaminophen (TYLENOL) tablet 650 mg  650 mg Oral Q6H PRN Okonkwo, Justina A, NP      . alum & mag hydroxide-simeth (MAALOX/MYLANTA) 200-200-20 MG/5ML suspension 30 mL  30 mL Oral Q4H PRN Okonkwo, Justina A, NP      . amLODipine (NORVASC) tablet 5 mg  5 mg Oral Daily Donell SievertSimon, Spencer E, PA-C   5 mg at 02/13/18 1155  . ARIPiprazole (ABILIFY) tablet 5 mg  5 mg Oral Daily Money, Gerlene Burdockravis B, FNP      . chlorproMAZINE (THORAZINE) tablet 50 mg  50 mg Oral QHS Armandina StammerNwoko, Agnes I, NP   50 mg at 02/12/18 2153  . hydrOXYzine (ATARAX/VISTARIL) tablet 50 mg  50 mg Oral Q6H PRN Armandina StammerNwoko, Agnes I, NP   50 mg at 02/12/18 1647  . ibuprofen (ADVIL,MOTRIN) tablet 800 mg  800 mg Oral Q8H PRN Donell SievertSimon, Spencer E, PA-C      . lisinopril (PRINIVIL,ZESTRIL) tablet 5 mg  5 mg Oral Daily Donell SievertSimon, Spencer E, PA-C   5 mg at 02/13/18 1155  . magnesium hydroxide (MILK OF MAGNESIA) suspension 30 mL  30 mL Oral Daily PRN Okonkwo, Justina A, NP      . mirtazapine (REMERON) tablet 15 mg  15 mg Oral QHS Nwoko, Agnes I, NP   15 mg at 02/12/18 2154  . traZODone (DESYREL) tablet 150 mg  150 mg Oral QHS PRN Armandina StammerNwoko, Agnes I, NP   150 mg at 02/12/18 2303    Lab Results: No results found for this or any previous visit (from the past 48 hour(s)).  Blood Alcohol level:  Lab Results  Component Value Date   ETH 127 (H) 02/10/2018   ETH <10 12/11/2017    Metabolic Disorder Labs: Lab Results  Component Value Date   HGBA1C 5.2 06/21/2016   MPG 100 04/09/2012   Lab Results  Component Value Date   PROLACTIN 21.0 (H) 06/21/2016   Lab Results  Component Value Date   CHOL 152 11/07/2017   TRIG 142 11/07/2017   HDL 40 (L) 11/07/2017   CHOLHDL 3.8 11/07/2017   VLDL 28 11/07/2017   LDLCALC 84  11/07/2017   LDLCALC 88 06/21/2016    Physical Findings: AIMS: Facial and Oral Movements Muscles of Facial Expression: None, normal Lips and Perioral Area: None, normal Jaw: None, normal Tongue: None, normal,Extremity Movements Upper (arms, wrists, hands, fingers): None, normal Lower (legs, knees, ankles, toes): None, normal, Trunk Movements Neck, shoulders, hips: None, normal, Overall Severity Severity of abnormal movements (highest score from questions above): None, normal Incapacitation due to abnormal movements: None, normal Patient's awareness of abnormal movements (rate only patient's report): No Awareness, Dental Status  Current problems with teeth and/or dentures?: No Does patient usually wear dentures?: No  CIWA:  CIWA-Ar Total: 1 COWS:  COWS Total Score: 1  Musculoskeletal: Strength & Muscle Tone: within normal limits Gait & Station: normal Patient leans: N/A  Psychiatric Specialty Exam: Physical Exam  Nursing note and vitals reviewed. Constitutional: He is oriented to person, place, and time. He appears well-developed and well-nourished.  Cardiovascular: Normal rate.  Respiratory: Effort normal.  Musculoskeletal: Normal range of motion.  Neurological: He is alert and oriented to person, place, and time.    Review of Systems  Constitutional: Negative.   HENT: Negative.   Eyes: Negative.   Respiratory: Negative.   Cardiovascular: Negative.   Gastrointestinal: Negative.   Genitourinary: Negative.   Musculoskeletal: Negative.   Skin: Negative.   Neurological: Negative.   Endo/Heme/Allergies: Negative.   Psychiatric/Behavioral: Positive for depression and suicidal ideas. The patient is nervous/anxious.     Blood pressure (!) 142/92, pulse 88, temperature 98.2 F (36.8 C), temperature source Oral, resp. rate 18, height 5\' 9"  (1.753 m), weight 80.3 kg (177 lb), SpO2 98 %.Body mass index is 26.14 kg/m.  General Appearance: Casual  Eye Contact:  Good  Speech:   Clear and Coherent and Normal Rate  Volume:  Normal  Mood:  Depressed  Affect:  Flat  Thought Process:  Goal Directed and Descriptions of Associations: Intact  Orientation:  Full (Time, Place, and Person)  Thought Content:  WDL  Suicidal Thoughts:  Yes.  without intent/plan  Homicidal Thoughts:  Yes.  without intent/plan  Memory:  Immediate;   Good Recent;   Good Remote;   Good  Judgement:  Good  Insight:  Fair  Psychomotor Activity:  Normal  Concentration:  Concentration: Good and Attention Span: Good  Recall:  Good  Fund of Knowledge:  Good  Language:  Good  Akathisia:  No  Handed:  Right  AIMS (if indicated):     Assets:  Communication Skills Desire for Improvement Financial Resources/Insurance Housing Physical Health Social Support Transportation  ADL's:  Intact  Cognition:  WNL  Sleep:  Number of Hours: 4.5   Problems Addressed: Bipolar I PTSD Cocaine abuse  Treatment Plan Summary: Daily contact with patient to assess and evaluate symptoms and progress in treatment, Medication management and Plan is to:  -Start Abilify 5 mg PO Daily for mood stability titrate as needed -Continue Remeron 15 mg PO QHS for sleep and mood stability -Continue Thorazine 50 mg PO QHS for insomnia -Continue Vistaril 60 mg PO Q6H PRN for anxiety -Encourage group therapy participation  Maryfrances Bunnell, FNP 02/13/2018, 1:02 PM   Agree with NP Progress Note

## 2018-02-13 NOTE — [facility] Group Notes (Signed)
LCSW Group Therapy Note 02/13/2018 12:56 PM  Type of Therapy/Topic: Group Therapy: Emotion Regulation  Participation Level: Did Not Attend   Description of Group:  The purpose of this group is to assist patients in learning to regulate negative emotions and experience positive emotions. Patients will be guided to discuss ways in which they have been vulnerable to their negative emotions. These vulnerabilities will be juxtaposed with experiences of positive emotions or situations, and patients will be challenged to use positive emotions to combat negative ones. Special emphasis will be placed on coping with negative emotions in conflict situations, and patients will process healthy conflict resolution skills.  Therapeutic Goals: 1. Patient will identify two positive emotions or experiences to reflect on in order to balance out negative emotions 2. Patient will label two or more emotions that they find the most difficult to experience 3. Patient will demonstrate positive conflict resolution skills through discussion and/or role plays  Summary of Patient Progress:  Invited, chose not to attend.     Therapeutic Modalities:  Cognitive Behavioral Therapy Feelings Identification Dialectical Behavioral Therapy   Alcario DroughtJolan Parthenia Tellefsen LCSWA Clinical Social Worker

## 2018-02-14 MED ORDER — BACITRACIN-NEOMYCIN-POLYMYXIN 400-5-5000 EX OINT
TOPICAL_OINTMENT | CUTANEOUS | Status: DC | PRN
  Administered 2018-02-14: 1 via TOPICAL
  Filled 2018-02-14: qty 1
  Filled 2018-02-14: qty 5

## 2018-02-14 MED ORDER — ARIPIPRAZOLE 10 MG PO TABS
10.0000 mg | ORAL_TABLET | Freq: Every day | ORAL | Status: DC
  Administered 2018-02-15 – 2018-02-17 (×3): 10 mg via ORAL
  Filled 2018-02-14 (×4): qty 1

## 2018-02-14 MED ORDER — BUPROPION HCL ER (XL) 150 MG PO TB24
150.0000 mg | ORAL_TABLET | Freq: Every day | ORAL | Status: DC
  Administered 2018-02-15: 150 mg via ORAL
  Filled 2018-02-14 (×2): qty 1

## 2018-02-14 MED ORDER — BUPROPION HCL ER (XL) 150 MG PO TB24: 150.0000 mg | ORAL_TABLET | Freq: Every day | ORAL | Status: DC

## 2018-02-14 MED ORDER — CLONIDINE HCL 0.1 MG PO TABS
0.1000 mg | ORAL_TABLET | Freq: Once | ORAL | Status: AC
  Administered 2018-02-14: 0.1 mg via ORAL
  Filled 2018-02-14 (×2): qty 1

## 2018-02-14 NOTE — Progress Notes (Addendum)
Data. Patient denies SI/HI/AVH. Verbally contracts for safety on the unit and to come to staff before acting of any self harm thoughts/feelings.  Patient interacting well with staff and other patients. Patient got a small scratch to the left side of his nose while playing basketball in the gym. He reports it was from, "A woman's finger nail." Scratch cleaned with water and soap and ABT oint applied.On his self assessment patient reported 8/10 for depression and hopelessness and 10/10 for anxiety. His goal for today is, "My thoughts of killing myself and brother-in-law." Action. Emotional support and encouragement offered. Education provided on medication, indications and side effect. Q 15 minute checks done for safety. Response. Safety on the unit maintained through 15 minute checks.  Medications taken as prescribed. Attended groups. Remained calm and appropriate through out shift.

## 2018-02-14 NOTE — [facility] Group Notes (Signed)
LCSW Group Therapy Note 02/14/2018 2:20 PM  Type of Therapy/Topic: Group Therapy: Balance in Life  Participation Level: Active  Description of Group:  This group will address the concept of balance and how it feels and looks when one is unbalanced. Patients will be encouraged to process areas in their lives that are out of balance and identify reasons for remaining unbalanced. Facilitators will guide patients in utilizing problem-solving interventions to address and correct the stressor making their life unbalanced. Understanding and applying boundaries will be explored and addressed for obtaining and maintaining a balanced life. Patients will be encouraged to explore ways to assertively make their unbalanced needs known to significant others in their lives, using other group members and facilitator for support and feedback.  Therapeutic Goals: 1. Patient will identify two or more emotions or situations they have that consume much of in their lives. 2. Patient will identify signs/triggers that life has become out of balance:  3. Patient will identify two ways to set boundaries in order to achieve balance in their lives:  4. Patient will demonstrate ability to communicate their needs through discussion and/or role plays  Summary of Patient Progress:  Chase Moss was engaged throughout the group. He participated and contributed to the group's discussion. Chase Moss stated he has issues with balancing his social life and isolating. He stated that when he discharges he plans to build more structure in his daily schedules so that he can work on remaining sober and becoming a better person.    Therapeutic Modalities:  Cognitive Behavioral Therapy Solution-Focused Therapy Assertiveness Training   Alcario DroughtJolan Jameyah Fennewald LCSWA Clinical Social Worker

## 2018-02-14 NOTE — Progress Notes (Signed)
**Note Chase-Identified via Obfuscation** [facility] MD Progress Note  02/14/2018 4:25 PM Chase Moss  MRN:  [id number]   Subjective: patient  continues to ruminate about his brother in law  " kicking me out even though I paid him some rent and not following up on his late sister's last wishes. States  " he was supposed to take her ashes down to [place] and spread around my mother's grave, and he has done nothing about it ". He reports he has ongoing " anger towards him" and states he has had intermittent HI towards him. Reports homelessness is a major stressor, but states he is hopeful he will be able to go to a residential/ rehab program after discharge. Denies medication side effects, but states " I still can't sleep well, I am up several times a night".   Objective:  I have discussed case with treatment team and have met with patient. 52 year old male , with history of substance use disorder. Reports history of  alcohol dependence ( states he has slowed down on alcohol use over recent months ), cannabis dependence, recent opiate abuse. Presented to the ED due to worsening depression, suicidal ideations, and homicidal ideations towards his brother in law. As above, he states he still has intermittent HI towards this person, and continues to ruminate about recent stressors. Staff notes indicate patient has continued to endorse depression, passive SI, with limited group participation.  At this time denies SI.  Behavior on unit in good control, has been going to some groups. Denies medication side effects. States he feels Abilify , in particular, has been helpful.      Principal Problem: Bipolar I disorder (Chase Moss) Diagnosis:   Patient Active Problem List   Diagnosis Date Noted  . Cannabis use disorder, mild, abuse [F12.10] 02/12/2018  . Bipolar I disorder (Chase Moss) [F31.9] 02/11/2018  . MDD (major depressive disorder) [F32.9] 12/11/2017  . Intentional overdose of drug in tablet form (Chase Moss) [T50.902A] 11/08/2017  . Dyslipidemia [E78.5]  11/08/2017  . Constipation [K59.00] 11/08/2017  . Drug overdose [T50.901A] 11/05/2017  . Acute alcoholic intoxication without complication (Chase Moss) [W09.811]   . Suicidal ideation [R45.851]   . Alcohol intoxication (Chase Moss) [F10.929] 10/22/2017  . Alcohol withdrawal ([place]) [F10.239] 10/22/2017  . Polysubstance abuse (Chase Moss) [F19.10]   . 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 Cocaine use disorder, moderate, dependence (Chase Moss) [F14.20] 03/31/2017  . HTN (hypertension) [I10] 06/20/2016  . MDD (major depressive disorder), recurrent severe, without psychosis (Chase Moss) [F33.2] 06/20/2016  . Tobacco use disorder [F17.200] 06/20/2016  . Trauma [T14.90XA] 07/27/2015  . Cannabis use disorder, moderate, dependence (Chase Moss) [F12.20]   . PTSD (post-traumatic stress disorder) [F43.10]   . Substance induced mood disorder (Chase Moss) [F19.94] 07/07/2014  . Alcohol use disorder, moderate, dependence (Chase Moss) [F10.20] 04/10/2012  . Suicidal thoughts [R45.851] 04/04/2012   Total Time spent with patient: 20 minutes  Past Psychiatric History: See H&P  Past Medical History:  Past Medical History:  Diagnosis Date  . Bipolar 1 disorder ([place])   . Depression   . Hypertension   . PTSD (post-traumatic stress disorder)    History reviewed. No pertinent surgical history. Family History:  Family History  Problem Relation Age of Onset  . Heart attack Other    Family Psychiatric  History: See H&P Social History:  Social History   Substance and Sexual Activity  Alcohol Use Yes  . Alcohol/week: 1.2 oz  . Types: 2 Cans of beer per week   Comment: daily alcohol  Social History   Substance and Sexual Activity  Drug Use Yes  . Types: Cocaine, Marijuana   Comment: several times a day- pt reports hasn't used since detoxic     Social History   Socioeconomic History  . Marital status: Divorced    Spouse name: None  . Number of children: None  . Years of education:  None  . Highest education level: None  Social Needs  . Financial resource strain: None  . Food insecurity - worry: None  . Food insecurity - inability: None  . Transportation needs - medical: None  . Transportation needs - non-medical: None  Occupational History  . None  Tobacco Use  . Smoking status: Current Every Day Smoker    Packs/day: 1.00    Years: 30.00    Pack years: 30.00    Types: Cigarettes  . Smokeless tobacco: Never Used  Substance and Sexual Activity  . Alcohol use: Yes    Alcohol/week: 1.2 oz    Types: 2 Cans of beer per week    Comment: daily alcohol  . Drug use: Yes    Types: Cocaine, Marijuana    Comment: several times a day- pt reports hasn't used since detoxic   . Sexual activity: Yes  Other Topics Concern  . None  Social History Narrative  . None   Additional Social History:    Sleep: Fair  Appetite:  Good  Current Medications: Current Facility-Administered Medications  Medication Dose Route Frequency Provider Last Rate Last Dose  . acetaminophen (TYLENOL) tablet 650 mg  650 mg Oral Q6H PRN Okonkwo, Justina A, NP      . alum & mag hydroxide-simeth (MAALOX/MYLANTA) 200-200-20 MG/5ML suspension 30 mL  30 mL Oral Q4H PRN Okonkwo, Justina A, NP      . amLODipine (NORVASC) tablet 5 mg  5 mg Oral Daily Patriciaann Clan E, PA-C   5 mg at 02/14/18 0904  . ARIPiprazole (ABILIFY) tablet 5 mg  5 mg Oral Daily Money, Lowry Ram, FNP   5 mg at 02/14/18 4268  . chlorproMAZINE (THORAZINE) tablet 50 mg  50 mg Oral QHS Nwoko, Agnes I, NP   50 mg at 02/13/18 2146  . hydrOXYzine (ATARAX/VISTARIL) tablet 50 mg  50 mg Oral Q6H PRN Lindell Spar I, NP   50 mg at 02/12/18 1647  . ibuprofen (ADVIL,MOTRIN) tablet 800 mg  800 mg Oral Q8H PRN Patriciaann Clan E, PA-C      . lisinopril (PRINIVIL,ZESTRIL) tablet 5 mg  5 mg Oral Daily Patriciaann Clan E, PA-C   5 mg at 02/14/18 3419  . magnesium hydroxide (MILK OF MAGNESIA) suspension 30 mL  30 mL Oral Daily PRN Okonkwo, Justina A, NP       . mirtazapine (REMERON) tablet 15 mg  15 mg Oral QHS Nwoko, Agnes I, NP   15 mg at 02/13/18 2146  . neomycin-bacitracin-polymyxin (NEOSPORIN) ointment   Topical PRN Lindell Spar I, NP      . traZODone (DESYREL) tablet 150 mg  150 mg Oral QHS PRN Lindell Spar I, NP   150 mg at 02/13/18 2336    Lab Results: No results found for this or any previous visit (from the past 48 hour(s)).  Blood Alcohol level:  Lab Results  Component Value Date   ETH 127 (H) 02/10/2018   ETH <10 62/22/9798    Metabolic Disorder Labs: Lab Results  Component Value Date   HGBA1C 5.2 06/21/2016   MPG 100 04/09/2012   Lab Results  Component  Value Date   PROLACTIN 21.0 (H) 06/21/2016   Lab Results  Component Value Date   CHOL 152 11/07/2017   TRIG 142 11/07/2017   HDL 40 (L) 11/07/2017   CHOLHDL 3.8 11/07/2017   VLDL 28 11/07/2017   LDLCALC 84 11/07/2017   LDLCALC 88 06/21/2016    Physical Findings: AIMS: Facial and Oral Movements Muscles of Facial Expression: None, normal Lips and Perioral Area: None, normal Jaw: None, normal Tongue: None, normal,Extremity Movements Upper (arms, wrists, hands, fingers): None, normal Lower (legs, knees, ankles, toes): None, normal, Trunk Movements Neck, shoulders, hips: None, normal, Overall Severity Severity of abnormal movements (highest score from questions above): None, normal Incapacitation due to abnormal movements: None, normal Patient's awareness of abnormal movements (rate only patient's report): No Awareness, Dental Status Current problems with teeth and/or dentures?: No Does patient usually wear dentures?: No  CIWA:  CIWA-Ar Total: 1 COWS:  COWS Total Score: 1  Musculoskeletal: Strength & Muscle Tone: within normal limits Gait & Station: normal Patient leans: N/A  Psychiatric Specialty Exam: Physical Exam  Nursing note and vitals reviewed. Constitutional: He is oriented to person, place, and time. He appears well-developed and  well-nourished.  Cardiovascular: Normal rate.  Respiratory: Effort normal.  Musculoskeletal: Normal range of motion.  Neurological: He is alert and oriented to person, place, and time.    Review of Systems  Constitutional: Negative.   HENT: Negative.   Eyes: Negative.   Respiratory: Negative.   Cardiovascular: Negative.   Gastrointestinal: Negative.   Genitourinary: Negative.   Musculoskeletal: Negative.   Skin: Negative.   Neurological: Negative.   Endo/Heme/Allergies: Negative.   Psychiatric/Behavioral: Positive for depression and suicidal ideas. The patient is nervous/anxious.   denies chest pain, no shortness of breath, no vomiting   Blood pressure (!) 124/91, pulse 88, temperature (!) 97.5 F (36.4 C), temperature source Oral, resp. rate 16, height _0  (1.753 m), weight 80.3 kg (177 lb), SpO2 98 %.Body mass index is 26.14 kg/m.  General Appearance: Well Groomed  Eye Contact:  Good  Speech:  Normal Rate  Volume:  Normal  Mood:  Depressed  Affect:  constricted, vaguely irritable   Thought Process:  Goal Directed and Descriptions of Associations: Intact  Orientation:  Other:  fully alert and attentive   Thought Content:  no hallucinations, no delusions, not internally preoccupied   Suicidal Thoughts:  No denies current suicidal plan or intention and contracts for safety on unit   Homicidal Thoughts:  Yes.  without intent/plan- denies plan or intention but states he continues to have HI towards brother in  law   Memory:  recent and remote grossly intact   Judgement:  Fair  Insight:  Fair  Psychomotor Activity:  Normal  Concentration:  Concentration: Good and Attention Span: Good  Recall:  Good  Fund of Knowledge:  Good  Language:  Good  Akathisia:  No  Handed:  Right  AIMS (if indicated):     Assets:  Communication Skills Desire for Improvement Financial Resources/Insurance Housing Physical Health Social Support Transportation  ADL's:  Intact  Cognition:  WNL   Sleep:  Number of Hours: 4.5   Assessment - patient reports partial improvement but reports ongoing depression, ruminations about recent stressors, angry ruminations towards his brother in law, with some persistent HI towards this person but no current reported plan or intention. He is not suicidal and is future oriented, stating he is looking forward to going to a rehab setting at discharge. Denies medication side effects-  reports ongoing insomnia . Of note, states Abilify has been well tolerated and feels it is helping him partially but noticeably . Of note, patient has been on Wellbutrin in the past, and was discharged on Wellbutrin after his last admission. States he took if for about three weeks after discharge and " I felt a lot better". Interested in restarting Wellbutrin trial, denies having had side effects. Denies history of seizures .  Treatment Plan Summary: Treatment Plan reviewed as below today 2/22  Daily contact with patient to assess and evaluate symptoms and progress in treatment, Medication management and Plan is to:  -Increase  Abilify to 10 mg QDAY for mood disorder  -Continue Remeron 15 mg PO QHS for sleep and mood stability -Restart Wellbutrin XL 150 mgrs QAM for depression -Continue Trazodone 150 mgrs QHS PRN for insomnia as needed  -D/C Thorazine ( to minimize use of two different antipsychotics, and to minimize potential side effects ) -Continue Vistaril 50 mg PO Q6H PRN for anxiety -Encourage group therapy participation to work on coping skills and symptom reduction -Continue to encourage efforts to work on sobriety and relapse prevention -Treatment team working on disposition planning options  Jenne Campus, MD 02/14/2018, 4:25 PM   Patient ID: Chase Moss, male   DOB: 13-Sep-1966, 52 y.o.   MRN: [id number]

## 2018-02-14 NOTE — Progress Notes (Signed)
Recreation Therapy Notes  Date: 02/14/18 Time: 0930 Location: [address] Dayroom  Group Topic: Stress Management  Goal Area(s) Addresses:  Patient will verbalize importance of using healthy stress management.  Patient will identify positive emotions associated with healthy stress management.   Intervention: Stress Management  Activity :  Meditation.  LRT introduced the stress management technique of meditation.  LRT played a meditation from the Calm app the focused on gratitude.  Patients were to listen and follow along with the meditation as it played.  Education:  Stress Management, Discharge Planning.   Education Outcome: Acknowledges edcuation/In group clarification offered/Needs additional education  Clinical Observations/Feedback: Pt did not attend group.    Shanine Kreiger, LRT/CTRS         Loribeth Katich A 02/14/2018 11:34 AM 

## 2018-02-14 NOTE — Plan of Care (Signed)
Patient has been attending most unit activities this shift. Patient has taken his medications as scheduled. Patient has had a small scratch to his nose while playing basketball.

## 2018-02-14 NOTE — Progress Notes (Signed)
D: Patient denies HI or AVH but endorses passive SI for which he is able to contract for safety. Patient presents as sullen, anxious, preoccupied about medication.  Pt. Did attend wrap up group this evening and otherwise with minimal interaction within the milieu.  Pt. Took his scheduled medication at bedtime as well as the available prn medication at a later time.  Pt. Presented to the nurses station to advise that the medications were not working and that "I know I'm not taking anything else, I just wanted you to know".    A: Patient given emotional support from RN. Patient encouraged to come to staff with concerns and/or questions. Patient's medication routine continued. Patient's orders and plan of care reviewed.   R: Patient remains appropriate and cooperative. Will continue to monitor patient q15 minutes for safety.

## 2018-02-15 MED ORDER — LISINOPRIL 5 MG PO TABS
5.0000 mg | ORAL_TABLET | Freq: Once | ORAL | Status: AC
  Administered 2018-02-15: 5 mg via ORAL
  Filled 2018-02-15: qty 1

## 2018-02-15 MED ORDER — PRAZOSIN HCL 2 MG PO CAPS
2.0000 mg | ORAL_CAPSULE | Freq: Every day | ORAL | Status: DC
  Administered 2018-02-15 – 2018-02-18 (×4): 2 mg via ORAL
  Filled 2018-02-15 (×2): qty 1
  Filled 2018-02-15: qty 2
  Filled 2018-02-15: qty 7
  Filled 2018-02-15 (×3): qty 1

## 2018-02-15 MED ORDER — LISINOPRIL 10 MG PO TABS
10.0000 mg | ORAL_TABLET | Freq: Every day | ORAL | Status: DC
  Administered 2018-02-16 – 2018-02-19 (×4): 10 mg via ORAL
  Filled 2018-02-15: qty 1
  Filled 2018-02-15: qty 7
  Filled 2018-02-15 (×2): qty 1
  Filled 2018-02-15: qty 2
  Filled 2018-02-15: qty 1
  Filled 2018-02-15: qty 7
  Filled 2018-02-15: qty 1

## 2018-02-15 MED ORDER — BUPROPION HCL ER (XL) 300 MG PO TB24
300.0000 mg | ORAL_TABLET | Freq: Every day | ORAL | Status: DC
  Administered 2018-02-16 – 2018-02-19 (×4): 300 mg via ORAL
  Filled 2018-02-15: qty 7
  Filled 2018-02-15 (×6): qty 1

## 2018-02-15 NOTE — Progress Notes (Signed)
D   Pt is appropriate and pleasant this evening    He complained of feeling like his BP was elevated and said he has a headache and pressure behind his eyes    bp was elevated 157/103  A   Notified NP who ordered one time dose of clonidine .1    Verbal support given   Medications administered and effectiveness monitored   Q 15 min checks  R   Pt is safe at present time

## 2018-02-15 NOTE — Progress Notes (Addendum)
[facility]  02/15/2018 12:56 PM Chase Moss  MRN:  [id number]   Subjective:  Patient states that he is doing ok and he still has HI towards his brother-in-law due to the way he honored his sister after she died. He then says that he would then have SI because he isn't going back to prison if he does that. He also informs me that the brother-in-law is not doing good at all since his sister died. "He uses heroin all the time and I think he got mad a t me because I would not go get drugs for him."  Objective: Patient's chart and findings reviewed and discussed with treatment team. Patient is found in his bed still covered up with his blankets. He is pleasant and cooperative. He states understanding that he needs to get past his brother-in-laws choices and move on with his life. Will push for discharge in next 2-3 days.   Principal Problem: Bipolar I disorder (HCC) Diagnosis:   Patient Active Problem List   Diagnosis Date Noted  . Cannabis use disorder, mild, abuse [F12.10] 02/12/2018  . Bipolar I disorder (HCC) [F31.9] 02/11/2018  . MDD (major depressive disorder) [F32.9] 12/11/2017  . Intentional overdose of drug in tablet form (HCC) [T50.902A] 11/08/2017  . Dyslipidemia [E78.5] 11/08/2017  . Constipation [K59.00] 11/08/2017  . Drug overdose [T50.901A] 11/05/2017  . Acute alcoholic intoxication without complication (HCC) [F10.929]   . Suicidal ideation [R45.851]   . Alcohol intoxication (HCC) [F10.929] 10/22/2017  . Alcohol withdrawal (HCC) [F10.239] 10/22/2017  . Polysubstance abuse (HCC) [F19.10]   . 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 Cocaine use disorder, moderate, dependence (HCC) [F14.20] 03/31/2017  . HTN (hypertension) [I10] 06/20/2016  . MDD (major depressive disorder), recurrent severe, without psychosis (HCC) [F33.2] 06/20/2016  . Tobacco use disorder [F17.200] 06/20/2016  . Trauma [T14.90XA] 07/27/2015  .  Cannabis use disorder, moderate, dependence (HCC) [F12.20]   . PTSD (post-traumatic stress disorder) [F43.10]   . Substance induced mood disorder (HCC) [F19.94] 07/07/2014  . Alcohol use disorder, moderate, dependence (HCC) [F10.20] 04/10/2012  . Suicidal thoughts [R45.851] 04/04/2012   Total Time spent with patient: 30 minutes  Past Psychiatric History: See H&P  Past Medical History:  Past Medical History:  Diagnosis Date  . Bipolar 1 disorder (HCC)   . Depression   . Hypertension   . PTSD (post-traumatic stress disorder)    History reviewed. No pertinent surgical history. Family History:  Family History  Problem Relation Age of Onset  . Heart attack Other    Family Psychiatric  History: See H&P Social History:  Social History   Substance and Sexual Activity  Alcohol Use Yes  . Alcohol/week: 1.2 oz  . Types: 2 Cans of beer per week   Comment: daily alcohol     Social History   Substance and Sexual Activity  Drug Use Yes  . Types: Cocaine, Marijuana   Comment: several times a day- pt reports hasn't used since detoxic     Social History   Socioeconomic History  . Marital status: Divorced    Spouse name: None  . Number of children: None  . Years of education: None  . Highest education level: None  Social Needs  . Financial resource strain: None  . Food insecurity - worry: None  . Food insecurity - inability: None  . Transportation needs - medical: None  . Transportation needs - non-medical: None  Occupational History  . None  Tobacco Use  . Smoking status: Current Every Day Smoker    Packs/day: 1.00    Years: 30.00    Pack years: 30.00    Types: Cigarettes  . Smokeless tobacco: Never Used  Substance and Sexual Activity  . Alcohol use: Yes    Alcohol/week: 1.2 oz    Types: 2 Cans of beer per week    Comment: daily alcohol  . Drug use: Yes    Types: Cocaine, Marijuana    Comment: several times a day- pt reports hasn't used since detoxic   . Sexual  activity: Yes  Other Topics Concern  . None  Social History Narrative  . None   Additional Social History:                         Sleep: Good  Appetite:  Good  Current Medications: Current Facility-Administered Medications  Medication Dose Route Frequency Provider Last Rate Last Dose  . acetaminophen (TYLENOL) tablet 650 mg  650 mg Oral Q6H PRN Okonkwo, Justina A, NP      . alum & mag hydroxide-simeth (MAALOX/MYLANTA) 200-200-20 MG/5ML suspension 30 mL  30 mL Oral Q4H PRN Okonkwo, Justina A, NP      . amLODipine (NORVASC) tablet 5 mg  5 mg Oral Daily Donell Sievert E, PA-C   5 mg at 02/15/18 0810  . ARIPiprazole (ABILIFY) tablet 10 mg  10 mg Oral Daily Cobos, Rockey Situ, MD   10 mg at 02/15/18 0810  . [START ON 02/16/2018] buPROPion (WELLBUTRIN XL) 24 hr tablet 300 mg  300 mg Oral Daily Money, Gerlene Burdock, FNP      . hydrOXYzine (ATARAX/VISTARIL) tablet 50 mg  50 mg Oral Q6H PRN Armandina Stammer I, NP   50 mg at 02/14/18 2134  . ibuprofen (ADVIL,MOTRIN) tablet 800 mg  800 mg Oral Q8H PRN Donell Sievert E, PA-C      . lisinopril (PRINIVIL,ZESTRIL) tablet 5 mg  5 mg Oral Daily Donell Sievert E, PA-C   5 mg at 02/15/18 0810  . magnesium hydroxide (MILK OF MAGNESIA) suspension 30 mL  30 mL Oral Daily PRN Okonkwo, Justina A, NP      . mirtazapine (REMERON) tablet 15 mg  15 mg Oral QHS Nwoko, Agnes I, NP   15 mg at 02/14/18 2134  . neomycin-bacitracin-polymyxin (NEOSPORIN) ointment   Topical PRN Armandina Stammer I, NP   1 application at 02/14/18 1722  . prazosin (MINIPRESS) capsule 2 mg  2 mg Oral QHS Money, Feliz Beam B, FNP      . traZODone (DESYREL) tablet 150 mg  150 mg Oral QHS PRN Armandina Stammer I, NP   150 mg at 02/14/18 2134    Lab Results: No results found for this or any previous visit (from the past 48 hour(s)).  Blood Alcohol level:  Lab Results  Component Value Date   ETH 127 (H) 02/10/2018   ETH <10 12/11/2017    Metabolic Disorder Labs: Lab Results  Component Value Date    HGBA1C 5.2 06/21/2016   MPG 100 04/09/2012   Lab Results  Component Value Date   PROLACTIN 21.0 (H) 06/21/2016   Lab Results  Component Value Date   CHOL 152 11/07/2017   TRIG 142 11/07/2017   HDL 40 (L) 11/07/2017   CHOLHDL 3.8 11/07/2017   VLDL 28 11/07/2017   LDLCALC 84 11/07/2017   LDLCALC 88 06/21/2016    Physical Findings: AIMS: Facial and Oral Movements Muscles of Facial Expression:  None, normal Lips and Perioral Area: None, normal Jaw: None, normal Tongue: None, normal,Extremity Movements Upper (arms, wrists, hands, fingers): None, normal Lower (legs, knees, ankles, toes): None, normal, Trunk Movements Neck, shoulders, hips: None, normal, Overall Severity Severity of abnormal movements (highest score from questions above): None, normal Incapacitation due to abnormal movements: None, normal Patient's awareness of abnormal movements (rate only patient's report): No Awareness, Dental Status Current problems with teeth and/or dentures?: No Does patient usually wear dentures?: No  CIWA:  CIWA-Ar Total: 1 COWS:  COWS Total Score: 1  Musculoskeletal: Strength & Muscle Tone: within normal limits Gait & Station: normal Patient leans: N/A  Psychiatric Specialty Exam: Physical Exam  Nursing note and vitals reviewed. Constitutional: He is oriented to person, place, and time. He appears well-developed and well-nourished.  Cardiovascular: Normal rate.  Respiratory: Effort normal.  Musculoskeletal: Normal range of motion.  Neurological: He is alert and oriented to person, place, and time.    Review of Systems  Constitutional: Negative.   HENT: Negative.   Eyes: Negative.   Respiratory: Negative.   Cardiovascular: Negative.   Gastrointestinal: Negative.   Genitourinary: Negative.   Musculoskeletal: Negative.   Skin: Negative.   Neurological: Negative.   Endo/Heme/Allergies: Negative.   Psychiatric/Behavioral: Positive for depression and suicidal ideas. The patient  is nervous/anxious.     Blood pressure (!) 150/91, pulse 79, temperature (!) 97.5 F (36.4 C), temperature source Oral, resp. rate 12, height 5\' 9"  (1.753 m), weight 80.3 kg (177 lb), SpO2 98 %.Body mass index is 26.14 kg/m.  General Appearance: Casual  Eye Contact:  Good  Speech:  Clear and Coherent and Normal Rate  Volume:  Normal  Mood:  Depressed  Affect:  Flat  Thought Process:  Goal Directed and Descriptions of Associations: Intact  Orientation:  Full (Time, Place, and Person)  Thought Content:  WDL  Suicidal Thoughts:  Yes.  without intent/plan  Homicidal Thoughts:  Yes.  without intent/plan  Memory:  Immediate;   Good Recent;   Good Remote;   Good  Judgement:  Good  Insight:  Fair  Psychomotor Activity:  Normal  Concentration:  Concentration: Good and Attention Span: Good  Recall:  Good  Fund of Knowledge:  Good  Language:  Good  Akathisia:  No  Handed:  Right  AIMS (if indicated):     Assets:  Communication Skills Desire for Improvement Financial Resources/Insurance Housing Physical Health Social Support Transportation  ADL's:  Intact  Cognition:  WNL  Sleep:  Number of Hours: 6   Problems Addressed: Bipolar I PTSD Cocaine abuse  Treatment Plan Summary: Daily contact with patient to assess and evaluate symptoms and progress in treatment, Medication management and Plan is to:  -Continue Abilify 10 mg PO Daily for mood stability titrate as needed -Continue Remeron 15 mg PO QHS for sleep and mood stability -Increase Wellbutrin XL 300 mg PO Daily for mood stability -Continue Vistaril 50 mg PO Q6H PRN for anxiety -Continue Trazodone 150 mg PO QHS for insomnia -Start Prazosin 2 mg PO QHS for PTSD nightmares -Encourage group therapy participation  Maryfrances Bunnell, FNP 02/15/2018, 12:56 PM Agree with NP Progress Note

## 2018-02-15 NOTE — Progress Notes (Signed)
D: Patient continues to report depressive symptoms and thoughts of self harm.  He does contract for safety on the unit.  He rates his depression and hopelessness as an 8; anxiety as a 7.  His sleep and appetite are fair; his energy level is low and his concentration is poor.  His goal today is "to change my attitude toward life and brother-in-law."  Homelessness is his major stressor and he hopes to get into a residential treatment plan for his substance abuse.  Patient does not report HI toward his brother-in-law.  His affect is flat, blunted with depressed mood.  Patient is attending groups and participating in his treatment.  A: Continue to monitor medication management and MD orders.  Safety checks continued every 15 minutes per protocol.  Offer support and encouragement as needed.  R: Patient is receptive to staff; his behavior is appropriate.

## 2018-02-15 NOTE — Progress Notes (Signed)
D   Pt is appropriate and pleasant this evening    His BP is WNL and has no other complaints    He interacts well with others and is compliant with treatment A   Notified NP who ordered one time dose of clonidine .1    Verbal support given   Medications administered and effectiveness monitored   Q 15 min checks  R   Pt is safe at present time

## 2018-02-15 NOTE — [facility] Group Notes (Signed)
LCSW Group Therapy Note  02/15/2018 9:30-10:30AM - [address], 10:30-11:30 - [address], 11:30-12:00 - 500 [address]  Type of Therapy and Topic:  Group Therapy: Anger Cues and Responses  Participation Level:  Did Not Attend   Description of Group:   In this group, patients learned how to recognize the physical, cognitive, emotional, and behavioral responses they have to anger-provoking situations.  They identified a recent time they became angry and how they reacted.  They analyzed how their reaction was possibly beneficial and how it was possibly unhelpful.  The group discussed a variety of healthier coping skills that could help with such a situation in the future.  Deep breathing was practiced briefly.  Therapeutic Goals: 1. Patients will remember their last incident of anger and how they felt emotionally and physically, what their thoughts were at the time, and how they behaved. 2. Patients will identify how their behavior at that time worked for them, as well as how it worked against them. 3. Patients will explore possible new behaviors to use in future anger situations. 4. Patients will learn that anger itself is normal and cannot be eliminated, and that healthier reactions can assist with resolving conflict rather than worsening situations.  Summary of Patient Progress:  N/A  Therapeutic Modalities:   Cognitive Behavioral Therapy  Chase Moss  02/15/2018 8:26 AM  

## 2018-02-16 NOTE — Plan of Care (Signed)
  Coping: Ability to cope will improve 02/16/2018 1945 - Progressing by Delos HaringPhillips, Cindie Rajagopalan A, RN Note Pt seen in dayroom playing cards with peers

## 2018-02-16 NOTE — [facility] Group Notes (Signed)
[facility] LCSW Group Therapy Note  Date/Time:  02/16/2018  11:00AM-12:00PM  Type of Therapy and Topic:  Group Therapy:  Music and Mood  Participation Level:  Did Not Attend   Description of Group: In this process group, members listened to a variety of genres of music and identified that different types of music evoke different responses.  Patients were encouraged to identify music that was soothing for them and music that was energizing for them.  Patients discussed how this knowledge can help with wellness and recovery in various ways including managing depression and anxiety as well as encouraging healthy sleep habits.    Therapeutic Goals: 1. Patients will explore the impact of different varieties of music on mood 2. Patients will verbalize the thoughts they have when listening to different types of music 3. Patients will identify music that is soothing to them as well as music that is energizing to them 4. Patients will discuss how to use this knowledge to assist in maintaining wellness and recovery 5. Patients will explore the use of music as a coping skill  Summary of Patient Progress:  N/A  Therapeutic Modalities: Solution Focused Brief Therapy Activity   Chase Cafiero Grossman-Orr, LCSW    

## 2018-02-16 NOTE — Progress Notes (Signed)
D. Pt presents with a sad affect and mood, but is calm and cooperative- pleasant during interactions. Pt complains of general body soreness from playing basketball yesterday and slept poorly because of his discomfort. Pt refuses pain meds at this time. Per pt's self inventory, pt rates his depression, hopelessness and anxiety an 8/8/8, respectively. Pt writes that his most important goal to work on today is to "change my attitude about life waking up to face another day", and to accomplish his goal he writes,"take meds to get regulated and feel better".  Pt currently endorses passive SI - but verbally contracts for safety.  A. Labs and vitals monitored. Pt compliant with medications. Pt supported emotionally and encouraged to express concerns and ask questions.   R. Pt remains safe with 15 minute checks. Will continue POC.

## 2018-02-16 NOTE — Progress Notes (Addendum)
[facility] Progress Note  02/16/2018 1:27 PM Fredna DowGregory Kluger  MRN:  [id number]   Subjective:  Patient reports that he is doing better and his depression has improved. He slept goo, but still had nightmares. He states that he has been on Prazosin 5 mg QHS and still had nightmares. He wants to continue it though. He has gotten in touch with his brother in [place], but his brother is getting ready to move, so he is waiting for his brother to get moved. He knows of a program at the [address] [facility] in [place] but needs a phone number. He still feels depressed an rates his depression at 6/10 and his anxiety at 6/10. He denies any SI/HI/AVh and contrats for safety. He has now decided that he will wait for the police to deal with his brother-in-law, because he thinks his brother-in-law has something to do with his sister dying.   Objective: Patient's chart and findings reviewed and discussed with treatment team. He is in the day room interacting and engaging with peers. He is pleasant and cooperative. He has been sleeping late, but did say his roommate likes to talk late so they are up until around 2 am talking. He is provided with a phone number to [facility]. He will continue current medication regimen.   Principal Problem: Bipolar I disorder (HCC) Diagnosis:   Patient Active Problem List   Diagnosis Date Noted  . Cannabis use disorder, mild, abuse [F12.10] 02/12/2018  . Bipolar I disorder (HCC) [F31.9] 02/11/2018  . MDD (major depressive disorder) [F32.9] 12/11/2017  . Intentional overdose of drug in tablet form (HCC) [T50.902A] 11/08/2017  . Dyslipidemia [E78.5] 11/08/2017  . Constipation [K59.00] 11/08/2017  . Drug overdose [T50.901A] 11/05/2017  . Acute alcoholic intoxication without complication (HCC) [F10.929]   . Suicidal ideation [R45.851]   . Alcohol intoxication (HCC) [F10.929] 10/22/2017  . Alcohol withdrawal (HCC) [F10.239] 10/22/2017  . Polysubstance abuse (HCC) [F19.10]   . 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 Cocaine use disorder, moderate, dependence (HCC) [F14.20] 03/31/2017  . HTN (hypertension) [I10] 06/20/2016  . MDD (major depressive disorder), recurrent severe, without psychosis (HCC) [F33.2] 06/20/2016  . Tobacco use disorder [F17.200] 06/20/2016  . Trauma [T14.90XA] 07/27/2015  . Cannabis use disorder, moderate, dependence (HCC) [F12.20]   . PTSD (post-traumatic stress disorder) [F43.10]   . Substance induced mood disorder (HCC) [F19.94] 07/07/2014  . Alcohol use disorder, moderate, dependence (HCC) [F10.20] 04/10/2012  . Suicidal thoughts [R45.851] 04/04/2012   Total Time spent with patient: 15 minutes  Past Psychiatric History: See H&P  Past Medical History:  Past Medical History:  Diagnosis Date  . Bipolar 1 disorder (HCC)   . Depression   . Hypertension   . PTSD (post-traumatic stress disorder)    History reviewed. No pertinent surgical history. Family History:  Family History  Problem Relation Age of Onset  . Heart attack Other    Family Psychiatric  History: See H&P Social History:  Social History   Substance and Sexual Activity  Alcohol Use Yes  . Alcohol/week: 1.2 oz  . Types: 2 Cans of beer per week   Comment: daily alcohol     Social History   Substance and Sexual Activity  Drug Use Yes  . Types: Cocaine, Marijuana   Comment: several times a day- pt reports hasn't used since detoxic     Social History   Socioeconomic History  . Marital status: Divorced    Spouse name: None  .  Number of children: None  . Years of education: None  . Highest education level: None  Social Needs  . Financial resource strain: None  . Food insecurity - worry: None  . Food insecurity - inability: None  . Transportation needs - medical: None  . Transportation needs - non-medical: None  Occupational History  . None  Tobacco Use  . Smoking status: Current Every Day Smoker     Packs/day: 1.00    Years: 30.00    Pack years: 30.00    Types: Cigarettes  . Smokeless tobacco: Never Used  Substance and Sexual Activity  . Alcohol use: Yes    Alcohol/week: 1.2 oz    Types: 2 Cans of beer per week    Comment: daily alcohol  . Drug use: Yes    Types: Cocaine, Marijuana    Comment: several times a day- pt reports hasn't used since detoxic   . Sexual activity: Yes  Other Topics Concern  . None  Social History Narrative  . None   Additional Social History:                         Sleep: Good  Appetite:  Good  Current Medications: Current Facility-Administered Medications  Medication Dose Route Frequency Provider Last Rate Last Dose  . acetaminophen (TYLENOL) tablet 650 mg  650 mg Oral Q6H PRN Okonkwo, Justina A, NP      . alum & mag hydroxide-simeth (MAALOX/MYLANTA) 200-200-20 MG/5ML suspension 30 mL  30 mL Oral Q4H PRN Okonkwo, Justina A, NP      . amLODipine (NORVASC) tablet 5 mg  5 mg Oral Daily Donell Sievert E, PA-C   5 mg at 02/16/18 0843  . ARIPiprazole (ABILIFY) tablet 10 mg  10 mg Oral Daily Angline Schweigert, Rockey Situ, MD   10 mg at 02/16/18 0842  . buPROPion (WELLBUTRIN XL) 24 hr tablet 300 mg  300 mg Oral Daily Money, Gerlene Burdock, FNP   300 mg at 02/16/18 1610  . hydrOXYzine (ATARAX/VISTARIL) tablet 50 mg  50 mg Oral Q6H PRN Armandina Stammer I, NP   50 mg at 02/15/18 2117  . ibuprofen (ADVIL,MOTRIN) tablet 800 mg  800 mg Oral Q8H PRN Kerry Hough, PA-C      . lisinopril (PRINIVIL,ZESTRIL) tablet 10 mg  10 mg Oral Daily Money, Gerlene Burdock, FNP   10 mg at 02/16/18 0843  . magnesium hydroxide (MILK OF MAGNESIA) suspension 30 mL  30 mL Oral Daily PRN Okonkwo, Justina A, NP      . mirtazapine (REMERON) tablet 15 mg  15 mg Oral QHS Nwoko, Agnes I, NP   15 mg at 02/15/18 2117  . neomycin-bacitracin-polymyxin (NEOSPORIN) ointment   Topical PRN Armandina Stammer I, NP   1 application at 02/14/18 1722  . prazosin (MINIPRESS) capsule 2 mg  2 mg Oral QHS Money, Gerlene Burdock,  FNP   2 mg at 02/15/18 2116  . traZODone (DESYREL) tablet 150 mg  150 mg Oral QHS PRN Armandina Stammer I, NP   150 mg at 02/15/18 2117    Lab Results: No results found for this or any previous visit (from the past 48 hour(s)).  Blood Alcohol level:  Lab Results  Component Value Date   ETH 127 (H) 02/10/2018   ETH <10 12/11/2017    Metabolic Disorder Labs: Lab Results  Component Value Date   HGBA1C 5.2 06/21/2016   MPG 100 04/09/2012   Lab Results  Component Value Date  PROLACTIN 21.0 (H) 06/21/2016   Lab Results  Component Value Date   CHOL 152 11/07/2017   TRIG 142 11/07/2017   HDL 40 (L) 11/07/2017   CHOLHDL 3.8 11/07/2017   VLDL 28 11/07/2017   LDLCALC 84 11/07/2017   LDLCALC 88 06/21/2016    Physical Findings: AIMS: Facial and Oral Movements Muscles of Facial Expression: None, normal Lips and Perioral Area: None, normal Jaw: None, normal Tongue: None, normal,Extremity Movements Upper (arms, wrists, hands, fingers): None, normal Lower (legs, knees, ankles, toes): None, normal, Trunk Movements Neck, shoulders, hips: None, normal, Overall Severity Severity of abnormal movements (highest score from questions above): None, normal Incapacitation due to abnormal movements: None, normal Patient's awareness of abnormal movements (rate only patient's report): No Awareness, Dental Status Current problems with teeth and/or dentures?: No Does patient usually wear dentures?: No  CIWA:  CIWA-Ar Total: 2 COWS:  COWS Total Score: 1  Musculoskeletal: Strength & Muscle Tone: within normal limits Gait & Station: normal Patient leans: N/A  Psychiatric Specialty Exam: Physical Exam  Nursing note and vitals reviewed. Constitutional: He is oriented to person, place, and time. He appears well-developed and well-nourished.  Respiratory: Effort normal.  Musculoskeletal: Normal range of motion.  Neurological: He is alert and oriented to person, place, and time.    Review of  Systems  Constitutional: Negative.   HENT: Negative.   Eyes: Negative.   Respiratory: Negative.   Cardiovascular: Negative.   Gastrointestinal: Negative.   Genitourinary: Negative.   Musculoskeletal: Negative.   Skin: Negative.   Neurological: Negative.   Endo/Heme/Allergies: Negative.   Psychiatric/Behavioral: Positive for depression. Negative for hallucinations and suicidal ideas. The patient is nervous/anxious.     Blood pressure (!) 130/93, pulse (!) 108, temperature 97.7 F (36.5 C), resp. rate 18, height 5\' 9"  (1.753 m), weight 80.3 kg (177 lb), SpO2 98 %.Body mass index is 26.14 kg/m.  General Appearance: Casual  Eye Contact:  Good  Speech:  Clear and Coherent and Normal Rate  Volume:  Normal  Mood:  Depressed  Affect:  Flat  Thought Process:  Goal Directed and Descriptions of Associations: Intact  Orientation:  Full (Time, Place, and Person)  Thought Content:  WDL  Suicidal Thoughts:  No  Homicidal Thoughts:  No  Memory:  Immediate;   Good Recent;   Good Remote;   Good  Judgement:  Good  Insight:  Fair  Psychomotor Activity:  Normal  Concentration:  Concentration: Good and Attention Span: Good  Recall:  Good  Fund of Knowledge:  Good  Language:  Good  Akathisia:  No  Handed:  Right  AIMS (if indicated):     Assets:  Communication Skills Desire for Improvement Financial Resources/Insurance Housing Physical Health Social Support Transportation  ADL's:  Intact  Cognition:  WNL  Sleep:  Number of Hours: 6   Problems Addressed: Bipolar I PTSD Cocaine abuse  Treatment Plan Summary: Daily contact with patient to assess and evaluate symptoms and progress in treatment, Medication management and Plan is to:  -Continue Abilify 10 mg PO Daily for mood stability titrate as needed -Continue Remeron 15 mg PO QHS for sleep and mood stability -Continue Wellbutrin XL 300 mg PO Daily for mood stability -Continue Vistaril 50 mg PO Q6H PRN for anxiety -Continue  Trazodone 150 mg PO QHS for insomnia -Continue Prazosin 2 mg PO QHS for PTSD nightmares -Encourage group therapy participation  Maryfrances Bunnell, FNP 02/16/2018, 1:27 PM Agree with NP Progress Note

## 2018-02-16 NOTE — Progress Notes (Signed)
Patient did attend the evening speaker AA meeting.  

## 2018-02-16 NOTE — Progress Notes (Signed)
D: Pt passive SI-contracts for safety. Pt denies HI/ AVH. Pt stated he was feeling little better this evening. Pt seen in dayroom playing cards with peers.   A: Pt was offered support and encouragement. Pt was given scheduled medications. Pt was encourage to attend groups. Q 15 minute checks were done for safety.   R:Pt attends groups and interacts well with peers and staff. Pt is taking medication. Pt has no complaints.Pt receptive to treatment and safety maintained on unit.

## 2018-02-17 MED ORDER — HYDROXYZINE HCL 50 MG PO TABS
50.0000 mg | ORAL_TABLET | Freq: Every day | ORAL | Status: DC
  Administered 2018-02-17 – 2018-02-18 (×2): 50 mg via ORAL
  Filled 2018-02-17 (×6): qty 1

## 2018-02-17 MED ORDER — ARIPIPRAZOLE 15 MG PO TABS
15.0000 mg | ORAL_TABLET | Freq: Every day | ORAL | Status: DC
  Administered 2018-02-18 – 2018-02-19 (×2): 15 mg via ORAL
  Filled 2018-02-17 (×2): qty 1
  Filled 2018-02-17: qty 7
  Filled 2018-02-17: qty 1

## 2018-02-17 MED ORDER — HYDROXYZINE HCL 50 MG PO TABS
50.0000 mg | ORAL_TABLET | Freq: Three times a day (TID) | ORAL | Status: DC | PRN
  Administered 2018-02-18: 50 mg via ORAL
  Filled 2018-02-17: qty 1

## 2018-02-17 NOTE — Tx Team (Signed)
Interdisciplinary Treatment and Diagnostic Plan Update  02/17/2018 Time of Session: 9:30am Chase Moss MRN: [id number]  Principal Diagnosis: Bipolar I disorder (HCC)  Secondary Diagnoses: Principal Problem:   Bipolar I disorder (HCC) Active Problems:   PTSD (post-traumatic stress disorder)   Cocaine use disorder, moderate, dependence (HCC)   Cannabis use disorder, mild, abuse   Current Medications:  Current Facility-Administered Medications  Medication Dose Route Frequency Provider Last Rate Last Dose  . acetaminophen (TYLENOL) tablet 650 mg  650 mg Oral Q6H PRN Okonkwo, Justina A, NP      . alum & mag hydroxide-simeth (MAALOX/MYLANTA) 200-200-20 MG/5ML suspension 30 mL  30 mL Oral Q4H PRN Okonkwo, Justina A, NP      . amLODipine (NORVASC) tablet 5 mg  5 mg Oral Daily Donell Sievert E, PA-C   5 mg at 02/17/18 0804  . ARIPiprazole (ABILIFY) tablet 10 mg  10 mg Oral Daily Cobos, Rockey Situ, MD   10 mg at 02/17/18 0804  . buPROPion (WELLBUTRIN XL) 24 hr tablet 300 mg  300 mg Oral Daily Money, Gerlene Burdock, FNP   300 mg at 02/17/18 0804  . hydrOXYzine (ATARAX/VISTARIL) tablet 50 mg  50 mg Oral Q6H PRN Armandina Stammer I, NP   50 mg at 02/16/18 2246  . ibuprofen (ADVIL,MOTRIN) tablet 800 mg  800 mg Oral Q8H PRN Donell Sievert E, PA-C      . lisinopril (PRINIVIL,ZESTRIL) tablet 10 mg  10 mg Oral Daily Money, Gerlene Burdock, FNP   10 mg at 02/17/18 0804  . magnesium hydroxide (MILK OF MAGNESIA) suspension 30 mL  30 mL Oral Daily PRN Okonkwo, Justina A, NP      . mirtazapine (REMERON) tablet 15 mg  15 mg Oral QHS Nwoko, Agnes I, NP   15 mg at 02/16/18 2246  . neomycin-bacitracin-polymyxin (NEOSPORIN) ointment   Topical PRN Armandina Stammer I, NP   1 application at 02/14/18 1722  . prazosin (MINIPRESS) capsule 2 mg  2 mg Oral QHS Money, Gerlene Burdock, FNP   2 mg at 02/16/18 2246  . traZODone (DESYREL) tablet 150 mg  150 mg Oral QHS PRN Armandina Stammer I, NP   150 mg at 02/16/18 2246   PTA Medications: Medications  Prior to Admission  Medication Sig Dispense Refill Last Dose  . buPROPion 450 MG TB24 Take 450 mg by mouth daily. For depression 30 tablet 0 over 2 months at unknown time  . chlorproMAZINE (THORAZINE) 50 MG tablet Take 1 tablet (50 mg total) by mouth at bedtime. For mood control 30 tablet 0 over 2 months at unknown time  . hydrOXYzine (ATARAX/VISTARIL) 25 MG tablet Take 1 tablet (25 mg) by mouth three times daily & 2 tablets (50 mg) at bedtime: For anxiety/sleep 120 tablet 0 over 2 months at unknown time  . lisinopril (PRINIVIL,ZESTRIL) 5 MG tablet Take 1 tablet (5 mg total) by mouth daily. For high blood pressure 30 tablet 0 over 2 months at unknown time  . mirtazapine (REMERON) 15 MG tablet Take 1 tablet (15 mg total) by mouth at bedtime. For depression/sleep 30 tablet 0 over 2 months at unknown time  . traZODone (DESYREL) 150 MG tablet Take 1 tablet (150 mg total) by mouth at bedtime as needed for sleep. 30 tablet 0 over 2 months at unknown time    Patient Stressors: Financial difficulties Loss of sister last year with whom he was staying Medication change or noncompliance Substance abuse  Patient Strengths: Average or above average intelligence Capable of independent  living General fund of knowledge Motivation for treatment/growth  Treatment Modalities: Medication Management, Group therapy, Case management,  1 to 1 session with clinician, Psychoeducation, Recreational therapy.   Physician Treatment Plan for Primary Diagnosis: Bipolar I disorder (HCC)  Medication Management: Evaluate patient's response, side effects, and tolerance of medication regimen.  Therapeutic Interventions: 1 to 1 sessions, Unit Group sessions and Medication administration.  Evaluation of Outcomes: Adequate for discharge   Physician Treatment Plan for Secondary Diagnosis: Principal Problem:   Bipolar I disorder (HCC) Active Problems:   PTSD (post-traumatic stress disorder)   Cocaine use disorder,  moderate, dependence (HCC)   Cannabis use disorder, mild, abuse  Medication Management: Evaluate patient's response, side effects, and tolerance of medication regimen.  Therapeutic Interventions: 1 to 1 sessions, Unit Group sessions and Medication administration.  Evaluation of Outcomes: Adequate for discharge   RN Treatment Plan for Primary Diagnosis: Bipolar I disorder (HCC) Long Term Goal(s): Knowledge of disease and therapeutic regimen to maintain health will improve  Short Term Goals: Ability to remain free from injury will improve, Ability to verbalize frustration and anger appropriately will improve, Ability to demonstrate self-control, Ability to participate in decision making will improve, Ability to verbalize feelings will improve, Ability to disclose and discuss suicidal ideas, Ability to identify and develop effective coping behaviors will improve and Compliance with prescribed medications will improve  Medication Management: RN will administer medications as ordered by provider, will assess and evaluate patient's response and provide education to patient for prescribed medication. RN will report any adverse and/or side effects to prescribing provider.  Therapeutic Interventions: 1 on 1 counseling sessions, Psychoeducation, Medication administration, Evaluate responses to treatment, Monitor vital signs and CBGs as ordered, Perform/monitor CIWA, COWS, AIMS and Fall Risk screenings as ordered, Perform wound care treatments as ordered.  Evaluation of Outcomes: Adequate for discharge   LCSW Treatment Plan for Primary Diagnosis: Bipolar I disorder (HCC) Long Term Goal(s): Safe transition to appropriate next level of care at discharge, Engage patient in therapeutic group addressing interpersonal concerns.  Short Term Goals: Engage patient in aftercare planning with referrals and resources, Increase social support, Increase ability to appropriately verbalize feelings, Increase emotional  regulation, Facilitate acceptance of mental health diagnosis and concerns, Facilitate patient progression through stages of change regarding substance use diagnoses and concerns, Identify triggers associated with mental health/substance abuse issues and Increase skills for wellness and recovery  Therapeutic Interventions: Assess for all discharge needs, 1 to 1 time with Social worker, Explore available resources and support systems, Assess for adequacy in community support network, Educate family and significant other(s) on suicide prevention, Complete Psychosocial Assessment, Interpersonal group therapy.  Evaluation of Outcomes: Adequate for discharge   Progress in Treatment: Attending groups: Yes Participating in groups: Yes Taking medication as prescribed: Yes. Toleration medication: Yes. Family/Significant other contact made: SPE completed with pt; pt declined to consent to family contact.  Patient understands diagnosis: Yes. Discussing patient identified problems/goals with staff: Yes. Medical problems stabilized or resolved: Yes. Denies suicidal/homicidal ideation: Yes. Issues/concerns per patient self-inventory: No. Other:   New problem(s) identified: None  New Short Term/Long Term Goal(s): medication stabilization, elimination of SI thoughts, development of comprehensive mental wellness plan.   Patient Goal: "To get back on my medications and not be depressed. I also want to get rid of my suicidal and homicidal thoughts"  Discharge Plan or Barriers: Pt has [facility] screening on Tuesday, 2/25 and will follow-up with [facility] for outpatient services. Pt provided with AA/NA list for [facility]  and [facility] pamphlet for additional community resources.   Reason for Continuation of Hospitalization: none  Estimated Length of Stay: Monday, 02/17/18  Attendees: Patient:  02/17/2018 9:15 AM  Physician: Dr. Nehemiah MassedFernando Cobos 02/17/2018 9:15 AM  Nursing: Erskine SquibbJane RN 02/17/2018 9:15 AM  RN Care  Manager: Onnie BoerJennifer Clark CM 02/17/2018 9:15 AM  Social Worker: Trula SladeHeather Smart, LCSW [place], [place] 02/17/2018 9:15 AM  Recreational Therapist: x 02/17/2018 9:15 AM  Other: Armandina StammerAgnes Nwoko NP; Reola Calkinsravis Money NP; Hillery Jacksanika Lewis NP 02/17/2018 9:15 AM  Other:  02/17/2018 9:15 AM  Other: 02/17/2018 9:15 AM    Scribe for Treatment Team: Ledell PeoplesHeather N Smart, LCSW 02/17/2018 9:15 AM

## 2018-02-17 NOTE — Plan of Care (Signed)
  Safety: Ability to disclose and discuss suicidal ideas will improve 02/17/2018 2036 - Progressing by Delos HaringPhillips, Malaney Mcbean A, RN Note Pt passive SI- contracts for safety

## 2018-02-17 NOTE — Progress Notes (Signed)
[facility]  02/17/2018 11:44 AM Chase DowGregory Davlin  MRN:  [id number]   Subjective:   Patient reports he is feeling " about the same", although does endorse partial improvement compared to admission.  He is future oriented, and focuses on disposition planning issues. States he is interested in going to  " [facility] " after discharge, and participate in outpatient treatment. He eventually plans to relocate out of state with his brother, but states this is more of a long term plan because brother is not yet sure  where he will be residing [place]working . He reports  ongoing depression and states he continues to have ( passive ) SI intermittently, without plan or intention. He also continues to ruminate about sister's death and brother in law . He reports  ongoing homicidal ideations towards his brother in law , whom he identifies as Rodney Boozeichard Ryder. Does not identify plan at this time and denies any current intention of HI , but states " the thing is if I start drinking then who knows what will happen".  States " I blame him for what happened to my sister, and for him not doing what she wanted us to do".  States "  the only way I would do it is if I took myself out too because I have no intention of going back to jail".  Denies medication side effects.   Objective: patient seen along with CSW and case discussed with treatment team. He reports partial improvement compared to admission and acknowledges improved mood . He presents with a more reactive affect but still vaguely dysphoric, irritable. He presents future oriented , and focusing on disposition plans. He does, however, continue to endorse passive SI ( denies plan or intention) and ongoing ruminations and homicidal ideations towards his brother in law. He does not endorse specific HI plan and is future oriented, stating his intention is to eventually go live with brother, but makes statement that if he relapses on alcohol he does not know what  could happen and that he would commit suicide rather than returning to jail.  He denies medication side effects.     Principal Problem: Bipolar I disorder (HCC) Diagnosis:   Patient Active Problem List   Diagnosis Date Noted  . Cannabis use disorder, mild, abuse [F12.10] 02/12/2018  . Bipolar I disorder (HCC) [F31.9] 02/11/2018  . MDD (major depressive disorder) [F32.9] 12/11/2017  . Intentional overdose of drug in tablet form (HCC) [T50.902A] 11/08/2017  . Dyslipidemia [E78.5] 11/08/2017  . Constipation [K59.00] 11/08/2017  . Drug overdose [T50.901A] 11/05/2017  . Acute alcoholic intoxication without complication (HCC) [F10.929]   . Suicidal ideation [R45.851]   . Alcohol intoxication (HCC) [F10.929] 10/22/2017  . Alcohol withdrawal (HCC) [F10.239] 10/22/2017  . Polysubstance abuse (HCC) [F19.10]   . 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 Cocaine use disorder, moderate, dependence (HCC) [F14.20] 03/31/2017  . HTN (hypertension) [I10] 06/20/2016  . MDD (major depressive disorder), recurrent severe, without psychosis (HCC) [F33.2] 06/20/2016  . Tobacco use disorder [F17.200] 06/20/2016  . Trauma [T14.90XA] 07/27/2015  . Cannabis use disorder, moderate, dependence (HCC) [F12.20]   . PTSD (post-traumatic stress disorder) [F43.10]   . Substance induced mood disorder (HCC) [F19.94] 07/07/2014  . Alcohol use disorder, moderate, dependence (HCC) [F10.20] 04/10/2012  . Suicidal thoughts [R45.851] 04/04/2012   Total Time spent with patient: 20 minutes  Past Psychiatric History: See H&P  Past Medical History:  Past Medical History:  Diagnosis  Date  . Bipolar 1 disorder (HCC)   . Depression   . Hypertension   . PTSD (post-traumatic stress disorder)    History reviewed. No pertinent surgical history. Family History:  Family History  Problem Relation Age of Onset  . Heart attack Other    Family Psychiatric  History: See  H&P Social History:  Social History   Substance and Sexual Activity  Alcohol Use Yes  . Alcohol/week: 1.2 oz  . Types: 2 Cans of beer per week   Comment: daily alcohol     Social History   Substance and Sexual Activity  Drug Use Yes  . Types: Cocaine, Marijuana   Comment: several times a day- pt reports hasn't used since detoxic     Social History   Socioeconomic History  . Marital status: Divorced    Spouse name: None  . Number of children: None  . Years of education: None  . Highest education level: None  Social Needs  . Financial resource strain: None  . Food insecurity - worry: None  . Food insecurity - inability: None  . Transportation needs - medical: None  . Transportation needs - non-medical: None  Occupational History  . None  Tobacco Use  . Smoking status: Current Every Day Smoker    Packs/day: 1.00    Years: 30.00    Pack years: 30.00    Types: Cigarettes  . Smokeless tobacco: Never Used  Substance and Sexual Activity  . Alcohol use: Yes    Alcohol/week: 1.2 oz    Types: 2 Cans of beer per week    Comment: daily alcohol  . Drug use: Yes    Types: Cocaine, Marijuana    Comment: several times a day- pt reports hasn't used since detoxic   . Sexual activity: Yes  Other Topics Concern  . None  Social History Narrative  . None   Additional Social History:   Sleep: Fair  Appetite:  Good  Current Medications: Current Facility-Administered Medications  Medication Dose Route Frequency Provider Last Rate Last Dose  . acetaminophen (TYLENOL) tablet 650 mg  650 mg Oral Q6H PRN Okonkwo, Justina A, NP      . alum & mag hydroxide-simeth (MAALOX/MYLANTA) 200-200-20 MG/5ML suspension 30 mL  30 mL Oral Q4H PRN Okonkwo, Justina A, NP      . amLODipine (NORVASC) tablet 5 mg  5 mg Oral Daily Donell Sievert E, PA-C   5 mg at 02/17/18 0804  . ARIPiprazole (ABILIFY) tablet 10 mg  10 mg Oral Daily Cobos, Rockey Situ, MD   10 mg at 02/17/18 0804  . buPROPion  (WELLBUTRIN XL) 24 hr tablet 300 mg  300 mg Oral Daily Money, Gerlene Burdock, FNP   300 mg at 02/17/18 0804  . hydrOXYzine (ATARAX/VISTARIL) tablet 50 mg  50 mg Oral Q6H PRN Armandina Stammer I, NP   50 mg at 02/16/18 2246  . ibuprofen (ADVIL,MOTRIN) tablet 800 mg  800 mg Oral Q8H PRN Donell Sievert E, PA-C      . lisinopril (PRINIVIL,ZESTRIL) tablet 10 mg  10 mg Oral Daily Money, Gerlene Burdock, FNP   10 mg at 02/17/18 0804  . magnesium hydroxide (MILK OF MAGNESIA) suspension 30 mL  30 mL Oral Daily PRN Okonkwo, Justina A, NP      . mirtazapine (REMERON) tablet 15 mg  15 mg Oral QHS Nwoko, Agnes I, NP   15 mg at 02/16/18 2246  . neomycin-bacitracin-polymyxin (NEOSPORIN) ointment   Topical PRN Sanjuana Kava, NP  1 application at 02/14/18 1722  . prazosin (MINIPRESS) capsule 2 mg  2 mg Oral QHS Money, Gerlene Burdock, FNP   2 mg at 02/16/18 2246  . traZODone (DESYREL) tablet 150 mg  150 mg Oral QHS PRN Armandina Stammer I, NP   150 mg at 02/16/18 2246    Lab Results: No results found for this or any previous visit (from the past 48 hour(s)).  Blood Alcohol level:  Lab Results  Component Value Date   ETH 127 (H) 02/10/2018   ETH <10 12/11/2017    Metabolic Disorder Labs: Lab Results  Component Value Date   HGBA1C 5.2 06/21/2016   MPG 100 04/09/2012   Lab Results  Component Value Date   PROLACTIN 21.0 (H) 06/21/2016   Lab Results  Component Value Date   CHOL 152 11/07/2017   TRIG 142 11/07/2017   HDL 40 (L) 11/07/2017   CHOLHDL 3.8 11/07/2017   VLDL 28 11/07/2017   LDLCALC 84 11/07/2017   LDLCALC 88 06/21/2016    Physical Findings: AIMS: Facial and Oral Movements Muscles of Facial Expression: None, normal Lips and Perioral Area: None, normal Jaw: None, normal Tongue: None, normal,Extremity Movements Upper (arms, wrists, hands, fingers): None, normal Lower (legs, knees, ankles, toes): None, normal, Trunk Movements Neck, shoulders, hips: None, normal, Overall Severity Severity of abnormal movements  (highest score from questions above): None, normal Incapacitation due to abnormal movements: None, normal Patient's awareness of abnormal movements (rate only patient's report): No Awareness, Dental Status Current problems with teeth and/or dentures?: No Does patient usually wear dentures?: No  CIWA:  CIWA-Ar Total: 2 COWS:  COWS Total Score: 1  Musculoskeletal: Strength & Muscle Tone: within normal limits Gait & Station: normal Patient leans: N/A  Psychiatric Specialty Exam: Physical Exam  Nursing note and vitals reviewed. Constitutional: He is oriented to person, place, and time. He appears well-developed and well-nourished.  Respiratory: Effort normal.  Musculoskeletal: Normal range of motion.  Neurological: He is alert and oriented to person, place, and time.    Review of Systems  Constitutional: Negative.   HENT: Negative.   Eyes: Negative.   Respiratory: Negative.   Cardiovascular: Negative.   Gastrointestinal: Negative.   Genitourinary: Negative.   Musculoskeletal: Negative.   Skin: Negative.   Neurological: Negative.   Endo/Heme/Allergies: Negative.   Psychiatric/Behavioral: Positive for depression. Negative for hallucinations and suicidal ideas. The patient is nervous/anxious.   denies chest pain, no shortness of breath, no vomiting   Blood pressure (!) 129/99, pulse (!) 107, temperature 97.7 F (36.5 C), resp. rate 16, height 5\' 9"  (1.753 m), weight 80.3 kg (177 lb), SpO2 98 %.Body mass index is 26.14 kg/m.  General Appearance: improved grooming   Eye Contact:  Good  Speech:  Normal Rate  Volume:  Normal  Mood:  reports partially improved mood, acknoweledges he feels better  Affect:  reactive, slightly irritable   Thought Process:  Linear and Descriptions of Associations: Intact  Orientation:  Other:  fully alert and attentive   Thought Content:  no hallucinations, no delusions, not internally preoccupied   Suicidal Thoughts:  No denies suicidal plan or  intention, but makes statement that if he were going to go to jail , he would " take myself out "   Homicidal Thoughts:  Yes.  without intent/plan patient states he continues to have HI towards his brother in law, does not endorse plan or intention, and states his plan is to go live with brother, but makes statement that if  he relapses on alcohol he does not know " what would happen then".'   Memory:  recent and remote grossly intact   Judgement:  Fair  Insight:  Fair  Psychomotor Activity:  Normal  Concentration:  Concentration: Good and Attention Span: Good  Recall:  Good  Fund of Knowledge:  Good  Language:  Good  Akathisia:  No  Handed:  Right  AIMS (if indicated):     Assets:  Communication Skills Desire for Improvement Financial Resources/Insurance Housing Physical Health Social Support Transportation  ADL's:  Intact  Cognition:  WNL  Sleep:  Number of Hours: 3.25   Assessment- patient presents with partially improved mood and range of affect, although noted to present vaguely, subtly irritable . Behavior in good control, no psychomotor agitation or restlessness . Although future oriented, and reporting plan of eventually moving in with a brother possibly out of state, he remains ruminative about sister's passing and about his anger towards his brother in law, and reports ongoing HI , although does not endorse plan or intention, but makes statement that in the event of him relapsing he does not know what would happen. He also makes statement he would rather kill self than face going to jail.  Denies medication side effects.   Treatment Plan Summary: Daily contact with patient to assess and evaluate symptoms and progress in treatment, Medication management and Plan is to:  Treatment Plan reviewed as below today 2/25  -Increase  Abilify to 15 mg QDAY for mood disorder  -Continue Remeron 15 mg  QHS for sleep , depression, anxiety -Continue Wellbutrin XL 300 mg Daily for  depression -Continue Vistaril 50 mg PO Q6H PRN for anxiety -Continue Trazodone 150 mg QHS PRN  for insomnia -Continue Prazosin 2 mg  QHS for PTSD associated nightmares -Encourage group therapy participation to work on Pharmacologist and symptom reduction -Have discussed with staff/CSW disposition planning and have also reviewed  D[place] to [place] issues, based on patient's report . CSW working on Psychologist, educational to [employer] .   Craige Cotta, MD 02/17/2018, 11:44 AM  Patient ID: Chase Moss, male   DOB: 12-27-1965, 53 y.o.   MRN: [id number]

## 2018-02-17 NOTE — Progress Notes (Signed)
Data. Patient denies HI/AVH. Patient does endorse passive SI this shift.  Verbally contracts for safety on the unit and to come to staff before acting of any self harm thoughts/feelings. Patient reports poor sleep last night, and was unable to fall asleep until about 3am. He also reports nightmares with the start of Trazodone. NP notified and new orders received. Patient napped most of shift.  Patient also educated to not nap during the day as this would increase his sleep problems. Patient responded, "Even if I only got to sleep at 3am? I'm supposed to get up and stay up at breakfast? Patient educated that this would infact be difficult for the first day, but would help reset his body clock. Patient agreed to try tomorrow.  On his self assessment patient reports 8/10 for anxiety, depression and hopelessness. His goal for today is, "My attitude, toward life and self." Action. Emotional support and encouragement offered. Education provided on medication, indications and side effect. Q 15 minute checks done for safety. Response. Safety on the unit maintained through 15 minute checks.  Medications taken as prescribed. Attended some groups. Remained calm and appropriate through out shift.

## 2018-02-17 NOTE — Progress Notes (Signed)
Pt attended AA group this evening.  

## 2018-02-17 NOTE — Progress Notes (Signed)
D: Pt  Passive SI-contracts for safety, denies HI/AV. Pt is pleasant and cooperative. Pt stated he was still endorsing nightmares, pt was concerned about getting sllep due to the  Trazodone being D/C .   A: Pt was offered support and encouragement. Pt was given scheduled medications. Pt was encourage to attend groups. Q 15 minute checks were done for safety.   R:Pt attends groups and interacts well with peers and staff. Pt is taking medication. Pt receptive to treatment and safety maintained on unit.

## 2018-02-17 NOTE — Plan of Care (Signed)
Patient has not had any injuries this shift, self inflicted or otherwise. Patient has taken his medications as prescribed. Patient has been sleeping all day and also reports poor sleep throughout the night.

## 2018-02-17 NOTE — [facility] Group Notes (Signed)
LCSW Group Therapy Note   02/17/2018 1:15pm   Type of Therapy and Topic:  Group Therapy:  Overcoming Obstacles   Participation Level:  Did Not Attend--chose to remain in room.    Description of Group:    In this group patients will be encouraged to explore what they see as obstacles to their own wellness and recovery. They will be guided to discuss their thoughts, feelings, and behaviors related to these obstacles. The group will process together ways to cope with barriers, with attention given to specific choices patients can make. Each patient will be challenged to identify changes they are motivated to make in order to overcome their obstacles. This group will be process-oriented, with patients participating in exploration of their own experiences as well as giving and receiving support and challenge from other group members.   Therapeutic Goals: 1. Patient will identify personal and current obstacles as they relate to admission. 2. Patient will identify barriers that currently interfere with their wellness or overcoming obstacles.  3. Patient will identify feelings, thought process and behaviors related to these barriers. 4. Patient will identify two changes they are willing to make to overcome these obstacles:      Summary of Patient Progress x     Therapeutic Modalities:   Cognitive Behavioral Therapy Solution Focused Therapy Motivational Interviewing Relapse Prevention Therapy  Ledell PeoplesHeather N Smart, LCSW 02/17/2018 3:03 PM

## 2018-02-17 NOTE — Progress Notes (Signed)
Psychoeducational Group Note  Date:  02/17/2018 Time:  0928  Group Topic/Focus:  Wellness Toolbox:   The focus of this group is to discuss various aspects of wellness, balancing those aspects and exploring ways to increase the ability to experience wellness.  Patients will create a wellness toolbox for use upon discharge.  Participation Level: Did Not Attend  Participation Quality:  Not Applicable  Affect:  Not Applicable  Cognitive:  Not Applicable  Insight:  Not Applicable  Engagement in Group: Not Applicable  Additional Comments:  Pt was asleep and could not attend group this morning.  Shanan Mcmiller E 02/17/2018, 10:43 AM

## 2018-02-18 DIAGNOSIS — F39 Unspecified mood [affective] disorder: Secondary | ICD-10-CM

## 2018-02-18 MED ORDER — CLONIDINE HCL 0.2 MG PO TABS
0.2000 mg | ORAL_TABLET | Freq: Once | ORAL | Status: AC
  Administered 2018-02-18: 0.2 mg via ORAL
  Filled 2018-02-18: qty 1
  Filled 2018-02-18: qty 2

## 2018-02-18 NOTE — Progress Notes (Signed)
[facility] MD Progress Note  02/18/2018 4:33 PM Joshue Badal  MRN:  [id number]   Subjective:   Patient reports he is feeling " about the same", although does endorse partial improvement compared to admission.  He is future oriented, and focuses on disposition planning issues. States he is interested in going to  " [facility] " after discharge, and participate in outpatient treatment. He eventually plans to relocate out of state with his brother, but states this is more of a long term plan because brother is not yet sure  where he will be residing [place] . He reports  ongoing depression and states he continues to have ( passive ) SI intermittently, without plan or intention. He also continues to ruminate about sister's death and brother in law . He reports  ongoing homicidal ideations towards his brother in law , whom he identifies as Rodney Booze. Does not identify plan at this time and denies any current intention of HI , but states " the thing is if I start drinking then who knows what will happen".  States " I blame him for what happened to my sister, and for him not doing what she wanted [place] to do".  States "  the only way I would do it is if I took myself out too because I have no intention of going back to jail".  Denies medication side effects.   Objective: patient seen along with CSW and case discussed with treatment team. He reports partial improvement compared to admission and acknowledges improved mood . He presents with a more reactive affect but still vaguely dysphoric, irritable. He presents future oriented , and focusing on disposition plans. He does, however, continue to endorse passive SI ( denies plan or intention) and ongoing ruminations and homicidal ideations towards his brother in law. He does not endorse specific HI plan and is future oriented, stating his intention is to eventually go live with brother, but makes statement that if he relapses on alcohol he does not know what  could happen and that he would commit suicide rather than returning to jail.  He denies medication side effects.     Principal Problem: Bipolar I disorder (HCC) Diagnosis:   Patient Active Problem List   Diagnosis Date Noted  . Bipolar I disorder (HCC) [F31.9] 02/11/2018    Priority: High  . PTSD (post-traumatic stress disorder) [F43.10]     Priority: Medium  . Cannabis use disorder, mild, abuse [F12.10] 02/12/2018  . MDD (major depressive disorder) [F32.9] 12/11/2017  . Intentional overdose of drug in tablet form (HCC) [T50.902A] 11/08/2017  . Dyslipidemia [E78.5] 11/08/2017  . Constipation [K59.00] 11/08/2017  . Drug overdose [T50.901A] 11/05/2017  . Acute alcoholic intoxication without complication (HCC) [F10.929]   . Suicidal ideation [R45.851]   . Alcohol intoxication (HCC) [F10.929] 10/22/2017  . Alcohol withdrawal (HCC) [F10.239] 10/22/2017  . Polysubstance abuse (HCC) [F19.10]   . 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 Cocaine use disorder, moderate, dependence (HCC) [F14.20] 03/31/2017  . HTN (hypertension) [I10] 06/20/2016  . MDD (major depressive disorder), recurrent severe, without psychosis (HCC) [F33.2] 06/20/2016  . Tobacco use disorder [F17.200] 06/20/2016  . Trauma [T14.90XA] 07/27/2015  . Cannabis use disorder, moderate, dependence (HCC) [F12.20]   . Substance induced mood disorder (HCC) [F19.94] 07/07/2014  . Alcohol use disorder, moderate, dependence (HCC) [F10.20] 04/10/2012  . Suicidal thoughts [R45.851] 04/04/2012   Total Time spent with patient: 20 minutes  Past Psychiatric History: See H&P  Past Medical History:  Past Medical History:  Diagnosis Date  . Bipolar 1 disorder (HCC)   . Depression   . Hypertension   . PTSD (post-traumatic stress disorder)    History reviewed. No pertinent surgical history. Family History:  Family History  Problem Relation Age of Onset  . Heart attack Other     Family Psychiatric  History: See H&P Social History:  Social History   Substance and Sexual Activity  Alcohol Use Yes  . Alcohol/week: 1.2 oz  . Types: 2 Cans of beer per week   Comment: daily alcohol     Social History   Substance and Sexual Activity  Drug Use Yes  . Types: Cocaine, Marijuana   Comment: several times a day- pt reports hasn't used since detoxic     Social History   Socioeconomic History  . Marital status: Divorced    Spouse name: None  . Number of children: None  . Years of education: None  . Highest education level: None  Social Needs  . Financial resource strain: None  . Food insecurity - worry: None  . Food insecurity - inability: None  . Transportation needs - medical: None  . Transportation needs - non-medical: None  Occupational History  . None  Tobacco Use  . Smoking status: Current Every Day Smoker    Packs/day: 1.00    Years: 30.00    Pack years: 30.00    Types: Cigarettes  . Smokeless tobacco: Never Used  Substance and Sexual Activity  . Alcohol use: Yes    Alcohol/week: 1.2 oz    Types: 2 Cans of beer per week    Comment: daily alcohol  . Drug use: Yes    Types: Cocaine, Marijuana    Comment: several times a day- pt reports hasn't used since detoxic   . Sexual activity: Yes  Other Topics Concern  . None  Social History Narrative  . None   Additional Social History:   Sleep: Fair  Appetite:  Good  Current Medications: Current Facility-Administered Medications  Medication Dose Route Frequency Provider Last Rate Last Dose  . acetaminophen (TYLENOL) tablet 650 mg  650 mg Oral Q6H PRN Okonkwo, Justina A, NP      . alum & mag hydroxide-simeth (MAALOX/MYLANTA) 200-200-20 MG/5ML suspension 30 mL  30 mL Oral Q4H PRN Okonkwo, Justina A, NP      . amLODipine (NORVASC) tablet 5 mg  5 mg Oral Daily Donell Sievert E, PA-C   5 mg at 02/18/18 0818  . ARIPiprazole (ABILIFY) tablet 15 mg  15 mg Oral Daily Cobos, Rockey Situ, MD   15 mg at  02/18/18 0818  . buPROPion (WELLBUTRIN XL) 24 hr tablet 300 mg  300 mg Oral Daily Money, Gerlene Burdock, FNP   300 mg at 02/18/18 0818  . hydrOXYzine (ATARAX/VISTARIL) tablet 50 mg  50 mg Oral TID PRN Armandina Stammer I, NP   50 mg at 02/18/18 0236  . hydrOXYzine (ATARAX/VISTARIL) tablet 50 mg  50 mg Oral QHS Armandina Stammer I, NP   50 mg at 02/17/18 2223  . ibuprofen (ADVIL,MOTRIN) tablet 800 mg  800 mg Oral Q8H PRN Donell Sievert E, PA-C      . lisinopril (PRINIVIL,ZESTRIL) tablet 10 mg  10 mg Oral Daily Money, Gerlene Burdock, FNP   10 mg at 02/18/18 0818  . magnesium hydroxide (MILK OF MAGNESIA) suspension 30 mL  30 mL Oral Daily PRN Okonkwo, Justina A, NP      . mirtazapine (REMERON) tablet  15 mg  15 mg Oral QHS Armandina Stammer I, NP   15 mg at 02/17/18 2223  . neomycin-bacitracin-polymyxin (NEOSPORIN) ointment   Topical PRN Armandina Stammer I, NP   1 application at 02/14/18 1722  . prazosin (MINIPRESS) capsule 2 mg  2 mg Oral QHS Money, Gerlene Burdock, FNP   2 mg at 02/17/18 2223    Lab Results: No results found for this or any previous visit (from the past 48 hour(s)).  Blood Alcohol level:  Lab Results  Component Value Date   ETH 127 (H) 02/10/2018   ETH <10 12/11/2017    Metabolic Disorder Labs: Lab Results  Component Value Date   HGBA1C 5.2 06/21/2016   MPG 100 04/09/2012   Lab Results  Component Value Date   PROLACTIN 21.0 (H) 06/21/2016   Lab Results  Component Value Date   CHOL 152 11/07/2017   TRIG 142 11/07/2017   HDL 40 (L) 11/07/2017   CHOLHDL 3.8 11/07/2017   VLDL 28 11/07/2017   LDLCALC 84 11/07/2017   LDLCALC 88 06/21/2016    Physical Findings: AIMS: Facial and Oral Movements Muscles of Facial Expression: None, normal Lips and Perioral Area: None, normal Jaw: None, normal Tongue: None, normal,Extremity Movements Upper (arms, wrists, hands, fingers): None, normal Lower (legs, knees, ankles, toes): None, normal, Trunk Movements Neck, shoulders, hips: None, normal, Overall  Severity Severity of abnormal movements (highest score from questions above): None, normal Incapacitation due to abnormal movements: None, normal Patient's awareness of abnormal movements (rate only patient's report): No Awareness, Dental Status Current problems with teeth and/or dentures?: No Does patient usually wear dentures?: No  CIWA:  CIWA-Ar Total: 2 COWS:  COWS Total Score: 1  Musculoskeletal: Strength & Muscle Tone: within normal limits Gait & Station: normal Patient leans: N/A  Psychiatric Specialty Exam: Physical Exam  Nursing note and vitals reviewed. Constitutional: He is oriented to person, place, and time. He appears well-developed and well-nourished.  Respiratory: Effort normal.  Musculoskeletal: Normal range of motion.  Neurological: He is alert and oriented to person, place, and time.    Review of Systems  Constitutional: Negative.   HENT: Negative.   Eyes: Negative.   Respiratory: Negative.   Cardiovascular: Negative.   Gastrointestinal: Negative.   Genitourinary: Negative.   Musculoskeletal: Negative.   Skin: Negative.   Neurological: Negative.   Endo/Heme/Allergies: Negative.   Psychiatric/Behavioral: Positive for depression. Negative for hallucinations and suicidal ideas. The patient is nervous/anxious.   denies chest pain, no shortness of breath, no vomiting   Blood pressure (!) 151/96, pulse 87, temperature 98.6 F (37 C), temperature source Oral, resp. rate 18, height 5\' 9"  (1.753 m), weight 80.3 kg (177 lb), SpO2 98 %.Body mass index is 26.14 kg/m.  General Appearance: improved grooming   Eye Contact:  Good  Speech:  Normal Rate  Volume:  Normal  Mood:  reports partially improved mood, acknoweledges he feels better  Affect:  reactive, slightly irritable   Thought Process:  Linear and Descriptions of Associations: Intact  Orientation:  Other:  fully alert and attentive   Thought Content:  no hallucinations, no delusions, not internally  preoccupied   Suicidal Thoughts:  No denies suicidal plan or intention, but makes statement that if he were going to go to jail , he would " take myself out "   Homicidal Thoughts:  Yes.  without intent/plan patient states he continues to have HI towards his brother in law, does not endorse plan or intention, and states his plan  is to go live with brother, but makes statement that if he relapses on alcohol he does not know " what would happen then".'   Memory:  recent and remote grossly intact   Judgement:  Fair  Insight:  Fair  Psychomotor Activity:  Normal  Concentration:  Concentration: Good and Attention Span: Good  Recall:  Good  Fund of Knowledge:  Good  Language:  Good  Akathisia:  No  Handed:  Right  AIMS (if indicated):     Assets:  Communication Skills Desire for Improvement Financial Resources/Insurance Housing Physical Health Social Support Transportation  ADL's:  Intact  Cognition:  WNL  Sleep:  Number of Hours: 6   Assessment- patient presents with partially improved mood and range of affect, although noted to present vaguely, subtly irritable . Behavior in good control, no psychomotor agitation or restlessness . Although future oriented, and reporting plan of eventually moving in with a brother possibly out of state, he remains ruminative about sister's passing and about his anger towards his brother in law, and reports ongoing HI , although does not endorse plan or intention, but makes statement that in the event of him relapsing he does not know what would happen. He also makes statement he would rather kill self than face going to jail.  Denies medication side effects.   Treatment Plan Summary: Daily contact with patient to assess and evaluate symptoms and progress in treatment, Medication management and Plan is to:  Treatment Plan reviewed as below today 2/25  -Increase  Abilify to 15 mg QDAY for mood disorder  -Continue Remeron 15 mg  QHS for sleep , depression,  anxiety -Continue Wellbutrin XL 300 mg Daily for depression -Continue Vistaril 50 mg PO Q6H PRN for anxiety -Continue Trazodone 150 mg QHS PRN  for insomnia -Continue Prazosin 2 mg  QHS for PTSD associated nightmares -Encourage group therapy participation to work on Pharmacologistcoping skills and symptom reduction -Have discussed with staff/CSW disposition planning and have also reviewed  Duty to [place]arn issues, based on patient's report . CSW working on Psychologist, educationalinsuring Duty to [employer] .   Armandina StammerAgnes Alina Gilkey, NP 02/18/2018, 4:33 PM  Patient ID: Chase DowGregory Lucchetti, male   DOB: 10/08/1966, 52 y.o.   MRN: [id number] Patient ID: Chase DowGregory Lindh, male   DOB: 09/02/1966, 52 y.o.   MRN: [id number] [facility] Progress Note  02/18/2018 4:34 PM Chase DowGregory Fischbach  MRN:  [id number]   Subjective: [name] reports, "I'm feeling okay. I'm no longer having the homicidal ideations. I feel a lot better since starting the Abilify. My racing thoughts are gone. [facility] accepted at [facility], confirmed today. They will be picking me up from here after discharge. There number is: [phone].   Objective: Patient seen, case discussed with treatment team. He reports a lot of improvement compared to admission and acknowledges improved mood . He presents with a more reactive affect. He presents future oriented , and focusing on disposition plans of which he says he has been accepted at [facility]. He presents relieved..    Principal Problem: Bipolar I disorder (HCC) Diagnosis:   Patient Active Problem List   Diagnosis Date Noted  . Bipolar I disorder (HCC) [F31.9] 02/11/2018    Priority: High  . PTSD (post-traumatic stress disorder) [F43.10]     Priority: Medium  . Cannabis use disorder, mild, abuse [F12.10] 02/12/2018  . MDD (major depressive disorder) [F32.9] 12/11/2017  . Intentional overdose of drug in tablet form (HCC) [T50.902A] 11/08/2017  . Dyslipidemia [E78.5] 11/08/2017  .  Constipation [K59.00] 11/08/2017  . Drug overdose [T50.901A] 11/05/2017  . Acute  alcoholic intoxication without complication (HCC) [F10.929]   . Suicidal ideation [R45.851]   . Alcohol intoxication (HCC) [F10.929] 10/22/2017  . Alcohol withdrawal (HCC) [F10.239] 10/22/2017  . Polysubstance abuse (HCC) [F19.10]   . 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 Cocaine use disorder, moderate, dependence (HCC) [F14.20] 03/31/2017  . HTN (hypertension) [I10] 06/20/2016  . MDD (major depressive disorder), recurrent severe, without psychosis (HCC) [F33.2] 06/20/2016  . Tobacco use disorder [F17.200] 06/20/2016  . Trauma [T14.90XA] 07/27/2015  . Cannabis use disorder, moderate, dependence (HCC) [F12.20]   . Substance induced mood disorder (HCC) [F19.94] 07/07/2014  . Alcohol use disorder, moderate, dependence (HCC) [F10.20] 04/10/2012  . Suicidal thoughts [R45.851] 04/04/2012   Total Time spent with patient: 20 minutes  Past Psychiatric History: See H&P  Past Medical History:  Past Medical History:  Diagnosis Date  . Bipolar 1 disorder (HCC)   . Depression   . Hypertension   . PTSD (post-traumatic stress disorder)    History reviewed. No pertinent surgical history. Family History:  Family History  Problem Relation Age of Onset  . Heart attack Other    Family Psychiatric  History: See H&P Social History:  Social History   Substance and Sexual Activity  Alcohol Use Yes  . Alcohol/week: 1.2 oz  . Types: 2 Cans of beer per week   Comment: daily alcohol     Social History   Substance and Sexual Activity  Drug Use Yes  . Types: Cocaine, Marijuana   Comment: several times a day- pt reports hasn't used since detoxic     Social History   Socioeconomic History  . Marital status: Divorced    Spouse name: None  . Number of children: None  . Years of education: None  . Highest education level: None  Social Needs  . Financial resource strain: None  . Food insecurity - worry: None  . Food insecurity -  inability: None  . Transportation needs - medical: None  . Transportation needs - non-medical: None  Occupational History  . None  Tobacco Use  . Smoking status: Current Every Day Smoker    Packs/day: 1.00    Years: 30.00    Pack years: 30.00    Types: Cigarettes  . Smokeless tobacco: Never Used  Substance and Sexual Activity  . Alcohol use: Yes    Alcohol/week: 1.2 oz    Types: 2 Cans of beer per week    Comment: daily alcohol  . Drug use: Yes    Types: Cocaine, Marijuana    Comment: several times a day- pt reports hasn't used since detoxic   . Sexual activity: Yes  Other Topics Concern  . None  Social History Narrative  . None   Additional Social History:   Sleep: Fair  Appetite:  Good  Current Medications: Current Facility-Administered Medications  Medication Dose Route Frequency Provider Last Rate Last Dose  . acetaminophen (TYLENOL) tablet 650 mg  650 mg Oral Q6H PRN Okonkwo, Justina A, NP      . alum & mag hydroxide-simeth (MAALOX/MYLANTA) 200-200-20 MG/5ML suspension 30 mL  30 mL Oral Q4H PRN Okonkwo, Justina A, NP      . amLODipine (NORVASC) tablet 5 mg  5 mg Oral Daily Donell Sievert E, PA-C   5 mg at 02/18/18 0818  . ARIPiprazole (ABILIFY) tablet 15 mg  15 mg Oral Daily Cobos, Rockey Situ, MD  15 mg at 02/18/18 0818  . buPROPion (WELLBUTRIN XL) 24 hr tablet 300 mg  300 mg Oral Daily Money, Gerlene Burdock, FNP   300 mg at 02/18/18 0818  . hydrOXYzine (ATARAX/VISTARIL) tablet 50 mg  50 mg Oral TID PRN Armandina Stammer I, NP   50 mg at 02/18/18 0236  . hydrOXYzine (ATARAX/VISTARIL) tablet 50 mg  50 mg Oral QHS Armandina Stammer I, NP   50 mg at 02/17/18 2223  . ibuprofen (ADVIL,MOTRIN) tablet 800 mg  800 mg Oral Q8H PRN Donell Sievert E, PA-C      . lisinopril (PRINIVIL,ZESTRIL) tablet 10 mg  10 mg Oral Daily Money, Gerlene Burdock, FNP   10 mg at 02/18/18 0818  . magnesium hydroxide (MILK OF MAGNESIA) suspension 30 mL  30 mL Oral Daily PRN Okonkwo, Justina A, NP      . mirtazapine  (REMERON) tablet 15 mg  15 mg Oral QHS Saniya Tranchina I, NP   15 mg at 02/17/18 2223  . neomycin-bacitracin-polymyxin (NEOSPORIN) ointment   Topical PRN Armandina Stammer I, NP   1 application at 02/14/18 1722  . prazosin (MINIPRESS) capsule 2 mg  2 mg Oral QHS Money, Gerlene Burdock, FNP   2 mg at 02/17/18 2223    Lab Results: No results found for this or any previous visit (from the past 48 hour(s)).  Blood Alcohol level:  Lab Results  Component Value Date   ETH 127 (H) 02/10/2018   ETH <10 12/11/2017    Metabolic Disorder Labs: Lab Results  Component Value Date   HGBA1C 5.2 06/21/2016   MPG 100 04/09/2012   Lab Results  Component Value Date   PROLACTIN 21.0 (H) 06/21/2016   Lab Results  Component Value Date   CHOL 152 11/07/2017   TRIG 142 11/07/2017   HDL 40 (L) 11/07/2017   CHOLHDL 3.8 11/07/2017   VLDL 28 11/07/2017   LDLCALC 84 11/07/2017   LDLCALC 88 06/21/2016    Physical Findings: AIMS: Facial and Oral Movements Muscles of Facial Expression: None, normal Lips and Perioral Area: None, normal Jaw: None, normal Tongue: None, normal,Extremity Movements Upper (arms, wrists, hands, fingers): None, normal Lower (legs, knees, ankles, toes): None, normal, Trunk Movements Neck, shoulders, hips: None, normal, Overall Severity Severity of abnormal movements (highest score from questions above): None, normal Incapacitation due to abnormal movements: None, normal Patient's awareness of abnormal movements (rate only patient's report): No Awareness, Dental Status Current problems with teeth and/or dentures?: No Does patient usually wear dentures?: No  CIWA:  CIWA-Ar Total: 2 COWS:  COWS Total Score: 1  Musculoskeletal: Strength & Muscle Tone: within normal limits Gait & Station: normal Patient leans: N/A  Psychiatric Specialty Exam: Physical Exam  Nursing note and vitals reviewed. Constitutional: He is oriented to person, place, and time. He appears well-developed and  well-nourished.  Respiratory: Effort normal.  Musculoskeletal: Normal range of motion.  Neurological: He is alert and oriented to person, place, and time.    Review of Systems  Constitutional: Negative.   HENT: Negative.   Eyes: Negative.   Respiratory: Negative.   Cardiovascular: Negative.   Gastrointestinal: Negative.   Genitourinary: Negative.   Musculoskeletal: Negative.   Skin: Negative.   Neurological: Negative.   Endo/Heme/Allergies: Negative.   Psychiatric/Behavioral: Positive for depression. Negative for hallucinations and suicidal ideas. The patient is nervous/anxious.   denies chest pain, no shortness of breath, no vomiting   Blood pressure (!) 151/96, pulse 87, temperature 98.6 F (37 C), temperature source Oral, resp.  rate 18, height 5\' 9"  (1.753 m), weight 80.3 kg (177 lb), SpO2 98 %.Body mass index is 26.14 kg/m.  General Appearance: improved grooming   Eye Contact:  Good  Speech:  Normal Rate  Volume:  Normal  Mood:  reports partially improved mood, acknoweledges he feels better  Affect:  reactive, slightly irritable   Thought Process:  Linear and Descriptions of Associations: Intact  Orientation:  Other:  fully alert and attentive   Thought Content:  no hallucinations, no delusions, not internally preoccupied   Suicidal Thoughts:  No denies suicidal plan or intention, but makes statement that if he were going to go to jail , he would " take myself out "   Homicidal Thoughts:  Yes.  without intent/plan patient states he continues to have HI towards his brother in law, does not endorse plan or intention, and states his plan is to go live with brother, but makes statement that if he relapses on alcohol he does not know " what would happen then".'   Memory:  recent and remote grossly intact   Judgement:  Fair  Insight:  Fair  Psychomotor Activity:  Normal  Concentration:  Concentration: Good and Attention Span: Good  Recall:  Good  Fund of Knowledge:  Good   Language:  Good  Akathisia:  No  Handed:  Right  AIMS (if indicated):     Assets:  Communication Skills Desire for Improvement Financial Resources/Insurance Housing Physical Health Social Support Transportation  ADL's:  Intact  Cognition:  WNL  Sleep:  Number of Hours: 6   Assessment- Patient presents with improved mood and full range of affect. Behavior in good control, no psychomotor agitation or restlessness. He is future oriented, and reporting today that he has been accepted to reside at the [facility] after discharge They apparently suppose to be picking him up upon discharge.  Treatment Plan Summary: Daily contact with patient to assess and evaluate symptoms and progress in treatment, Medication management and Plan is to:   Will continue today 02/18/2018 plan as below except where it is noted.  - Continue Abilify to 15 mg QDAY for mood disorder   -Continue Remeron 15 mg  QHS for sleep , depression, anxiety.  -Continue Wellbutrin XL 300 mg Daily for depression  -Continue Vistaril 50 mg PO Q6H PRN for anxiety.  -Continue Trazodone 150 mg QHS PRN  for insomnia.  -Continue Prazosin 2 mg  QHS for PTSD associated nightmares.  - Patient has been accepted at the [facility], SW aware.  -Encourage group therapy participation to work on coping skills and symptom reduction  -Have discussed with staff/CSW disposition planning and have also reviewed  Duty to [place] issues, based on  patient's report . CSW working on Psychologist, educational to [employer] .   Armandina Stammer, NP, PMHNP, FNP-BC. 02/18/2018, 4:34 PM  Patient ID: Chase Moss, male   DOB: 07-12-1966, 52 y.o.   MRN: [id number]

## 2018-02-18 NOTE — [facility] Group Notes (Signed)
[facility] 02/18/2018 1:15pm  Type of Therapy: Mental [facility] Association Presentation  Participation Level: Active  Participation Quality: Attentive  Affect: Appropriate  Cognitive: Oriented  Insight: Developing/Improving  Engagement in Therapy: Engaged  Modes of Intervention: Discussion, Education and Socialization  Summary of Progress/Problems: Mental Health Association (MHA) Speaker came to talk about his personal journey with mental health. The pt processed ways by which to relate to the speaker. MHA speaker provided handouts and educational information pertaining to groups and services offered by [facility]. Pt was engaged in speaker's presentation and was receptive to resources provided.    Pulte HomesHeather N Smart, LCSW 02/18/2018 3:26 PM

## 2018-02-18 NOTE — Progress Notes (Signed)
Patient requested that CSW contact a Mr. Maurine MinisterDennis [phone]) who is a Interior and spatial designerdirector for a substance abuse program in [place] called "[facility]". The patient reports that the program houses their participants at the [facility] in [place].   Per the patient, he has spoken to Mr. Maurine MinisterDennis and has arranged for himself to be set up with the [facility] program at discharge. The patient also reports that Mr. Maurine MinisterDennis has agreed to pick him up at discharge so that he can be oriented with the program as soon as he leaves the hospital.   CSW attempted to contact Mr. Maurine MinisterDennis [phone]) to verify this information and to discuss the patient's plan of care at discharge.   There was no answer at this time, CSW was unable to leave a voicemail due to the voicemail box not being set  up.   CSW will continue to follow and will attempt to contact Mr. Maurine MinisterDennis at a later time.    Baldo DaubJolan Kyandra Mcclaine, MSW, LCSWA Clinical Social Worker [facility]  Phone: [phone]

## 2018-02-18 NOTE — Progress Notes (Signed)
Patient ID: Chase Moss, male   DOB: 06/02/1966, 52 y.o.   MRN: [id number]  Pt currently presents with a flat affect and cooperative behavior. Pt interacts positively with peers. Pt main complaint is poor sleep. Pt reports ongoing nightmares and night sweats currently. Pt endorses passive SI this morning but states "I had a good talk with the doctor today though and I haven't had the thoughts since then."   Pt provided with medications per providers orders. Pt's labs and vitals were monitored throughout the night. Pt given a 1:1 about emotional and mental status. Pt supported and encouraged to express concerns and questions. Pt educated on medications and suicide prevention precautions.   Pt's safety ensured with 15 minute and environmental checks. Pt currently denies SI/HI and A/V hallucinations. Pt verbally agrees to seek staff if SI/HI or A/VH occurs and to consult with staff before acting on any harmful thoughts. Pt working on finding a suitable bed for substance abuse treatment at discharge. Will continue POC.

## 2018-02-18 NOTE — Progress Notes (Addendum)
Patient ID: Chase DowGregory Schaller, male   DOB: 02/05/1966, 52 y.o.   MRN: [id number]  DAR: Pt. Denies HI and A/V Hallucinations. He does report passive SI, he is able to contract for safety. He reports that his sleep last night was poor, his appetite is fair, his energy level is low, ans his concentration is poor. He rates his depression level 8/10, his hopelessness level is 7/10, and his anxiety level is 5/10. Patient does not report any pain. He does report feeling flushed and asked to check his BP this afternoon. NP Nwoko was notified of patient's elevated BP, see MAR for medication.  Scheduled medications administered to patient per physician's orders as well. Patient is initially feeling tired and reports he will go lay down. As the day progresses he is visible in the milieu. His affect and mood are anxious.Support and encouragement provided to the patient. Q15 minute checks are maintained for safety.

## 2018-02-18 NOTE — BHH Group Notes (Signed)
[facility] Group Notes:  Meditation  Date:  02/18/2018  Time:  4:25 PM  Type of Therapy:  Psychoeducational Skills  Participation Level:  Active  Participation Quality:  Appropriate and Attentive  Affect:  Appropriate  Cognitive:  Appropriate  Insight:  Improving  Engagement in Group:  Engaged  Modes of Intervention:  Activity and Education  Summary of Progress/Problems: Patient attended group and participated in meditation.   Chase Moss, Chase Moss E 02/18/2018, 4:25 PM

## 2018-02-18 NOTE — Progress Notes (Addendum)
Prior to being admitted to the hospital, the patient made statements that he had homicidal ideations towards his brother-in-law due to being kicked out of his brother-in-law's home and was not able to get all of his belongings.   The patient has DENIED any homicidal ideation since his stay at [facility]. The patient has also denied wanting to hurt his brother-in-law and that he only wanted to retrieve his belongings from his brother-in-law's home. Per the patient, he has spoken to his brother-in-law and states that he has agreed for the patient to retreive his things from the home. The patient will not disclose his brothers-in-law's phone number at this time.   - The patient has denied any homicidal ideation to his RN, CSW and other [facility] staff.  - The patient does not have any evidence that his initial threat is foreseeable (no access to weapons).  - The patient never expressed a specific plan for his ideations.   - The patient states that he and his brother-in-law are in a better space and that they have agreed that the patient can retreive his belongings at discharge. The patient does not plan to return to his brother-in-law's home, but instead will go to a treatment program.   CSW contacted [facility] Legal Department and spoke with Curtis SitesBethany Cox [phone]) to discuss whether or not it is appropriate to contact the police department for a "Duty to Warn" and/or the patient's brother-in-law for a"Duty to Protect" report.   Per Curtis SitesBethany Cox, based off the criteria needed in order to make these reports, this case DOES NOT require a "Duty to Warn" or a "Duty to Protect" report. Per Toma CopierBethany, in this case it would be a HIPAA violation due to the case not meeting specific criteria required.   Toma CopierBethany states that this case would be considered "an excited utterance", meaning that the patient more than likely made those statements due to being upset in the moment and because he was not able to get what he  wanted at that time. Toma CopierBethany states that based off the presented information, the patient did demonstrate having any real intentions on harming his brother-in-law and made the statements "in the heat of the moment". She also reports that there is not enough information to suggest that the patient's brother-in-law was in any real danger.   CSW consulted with SW leadership and [facility] Legal Department to ensure the patient's HIPAA rights were and are not being violated. CSW will continue to follow.   Chase Moss, MSW, LCSWA Clinical Social Worker [facility]  Phone: [phone]

## 2018-02-18 NOTE — Plan of Care (Signed)
  Progressing Activity: Interest or engagement in activities will improve 02/18/2018 1746 - Progressing by Lenord Fellersopson, Catalia Massett Elizabeth, RN

## 2018-02-19 MED ORDER — ARIPIPRAZOLE 15 MG PO TABS: 15.0000 mg | ORAL_TABLET | Freq: Every day | ORAL | 0 refills | Status: DC

## 2018-02-19 MED ORDER — HYDROXYZINE HCL 50 MG PO TABS: ORAL_TABLET | ORAL | 0 refills | Status: DC

## 2018-02-19 MED ORDER — BUPROPION HCL ER (XL) 300 MG PO TB24: 300.0000 mg | ORAL_TABLET | Freq: Every day | ORAL | 0 refills | Status: DC

## 2018-02-19 MED ORDER — PRAZOSIN HCL 2 MG PO CAPS: 2.0000 mg | ORAL_CAPSULE | Freq: Every day | ORAL | 0 refills | Status: DC

## 2018-02-19 MED ORDER — AMLODIPINE BESYLATE 5 MG PO TABS: 5.0000 mg | ORAL_TABLET | Freq: Every day | ORAL | 0 refills | Status: DC

## 2018-02-19 MED ORDER — LISINOPRIL 10 MG PO TABS: 10.0000 mg | ORAL_TABLET | Freq: Every day | ORAL | 0 refills | Status: DC

## 2018-02-19 MED ORDER — MIRTAZAPINE 15 MG PO TABS: 15.0000 mg | ORAL_TABLET | Freq: Every day | ORAL | 0 refills | Status: DC

## 2018-02-19 NOTE — Progress Notes (Signed)
Patient ID: Chase Moss, male   DOB: 06/23/1966, 52 y.o.   MRN: [id number]  Discharge Note- Belongings returned to patient at time of discharge. Discharge instructions and medications were reviewed with patient. Patient verbalized understanding of both medications and discharge instructions. Patient discharged to lobby where his ride was waiting. Q15 minute safety checks maintained until time of discharge. No distress upon discharge.

## 2018-02-19 NOTE — Progress Notes (Signed)
CSW spoke with [name] (cell- [phone]) regarding the patient's plan of care at discharge.   Per [name] he is the Interior and spatial designerdirector for [facility] (office [phone]).  [name] states that he is the director of the substance abuse and housing programs the agency offers to the community. He reports that he and the patient have talked and have arranged for the patient to discharge to their program for SA/MH treatment.   [name] reports that he will have a staff member pick the patient up at discharge around 1:30pm. The patient is aware.    CSW will inform the patient's treatment team and will continue to follow for a safe discharge.   Chase Moss, MSW, LCSWA Clinical Social Worker [facility]  Phone: [phone]

## 2018-02-19 NOTE — [facility] Suicide Risk Assessment (Signed)
[facility] Discharge Suicide Risk Assessment   Principal Problem: Bipolar I disorder [facility](HCC) Discharge Diagnoses:  Patient Active Problem List   Diagnosis Date Noted  . Cannabis use disorder, mild, abuse [F12.10] 02/12/2018  . Bipolar I disorder (HCC) [F31.9] 02/11/2018  . MDD (major depressive disorder) [F32.9] 12/11/2017  . Intentional overdose of drug in tablet form (HCC) [T50.902A] 11/08/2017  . Dyslipidemia [E78.5] 11/08/2017  . Constipation [K59.00] 11/08/2017  . Drug overdose [T50.901A] 11/05/2017  . Acute alcoholic intoxication without complication (HCC) [F10.929]   . Suicidal ideation [R45.851]   . Alcohol intoxication (HCC) [F10.929] 10/22/2017  . Alcohol withdrawal (HCC) [F10.239] 10/22/2017  . Polysubstance abuse (HCC) [F19.10]   . 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 Cocaine use disorder, moderate, dependence (HCC) [F14.20] 03/31/2017  . HTN (hypertension) [I10] 06/20/2016  . MDD (major depressive disorder), recurrent severe, without psychosis (HCC) [F33.2] 06/20/2016  . Tobacco use disorder [F17.200] 06/20/2016  . Trauma [T14.90XA] 07/27/2015  . Cannabis use disorder, moderate, dependence (HCC) [F12.20]   . PTSD (post-traumatic stress disorder) [F43.10]   . Substance induced mood disorder (HCC) [F19.94] 07/07/2014  . Alcohol use disorder, moderate, dependence (HCC) [F10.20] 04/10/2012  . Suicidal thoughts [R45.851] 04/04/2012    Total Time spent with patient: 30 minutes  Musculoskeletal: Strength & Muscle Tone: within normal limits Gait & Station: normal Patient leans: N/A  Psychiatric Specialty Exam: Review of Systems  Constitutional: Negative for chills and fever.  Respiratory: Negative for cough and shortness of breath.   Cardiovascular: Negative for chest pain.  Gastrointestinal: Negative for abdominal pain, heartburn, nausea and vomiting.  Psychiatric/Behavioral: Negative for depression, hallucinations  and suicidal ideas. The patient is not nervous/anxious.     Blood pressure 115/88, pulse 93, temperature 98.7 F (37.1 C), temperature source Oral, resp. rate 20, height 5\' 9"  (1.753 m), weight 80.3 kg (177 lb), SpO2 98 %.Body mass index is 26.14 kg/m.  General Appearance: Casual  Eye Contact::  Good  Speech:  Clear and Coherent and Normal Rate  Volume:  Normal  Mood:  Euthymic  Affect:  Congruent  Thought Process:  Coherent and Goal Directed  Orientation:  Full (Time, Place, and Person)  Thought Content:  Logical  Suicidal Thoughts:  No  Homicidal Thoughts:  No  Memory:  Immediate;   Fair Recent;   Fair Remote;   Fair  Judgement:  Fair  Insight:  Fair  Psychomotor Activity:  Normal  Concentration:  Good  Recall:  [name]  Fund of Knowledge:Good  Language: Fair  Akathisia:  No  Handed:    AIMS (if indicated):     Assets:  Resilience  Sleep:  Number of Hours: 5.25  Cognition: WNL  ADL's:  Intact   Mental Status Per Nursing Assessment::   On Admission:  Suicidal ideation indicated by patient  Demographic Factors:  Male and Caucasian  Loss Factors: NA  Historical Factors: Family history of mental illness or substance abuse  Risk Reduction Factors:   Positive social support, Positive therapeutic relationship and Positive coping skills or problem solving skills  Continued Clinical Symptoms:  Bipolar Disorder:   Depressive phase Alcohol/Substance Abuse/Dependencies  Cognitive Features That Contribute To Risk:  None    Suicide Risk:  Minimal: No identifiable suicidal ideation.  Patients presenting with no risk factors but with morbid ruminations; may be classified as minimal risk based on the severity of the depressive symptoms  Follow-up Information    [facility] Follow up on 02/20/2018.   Specialty:  Behavioral Health Why:  Hospital follow-up at 8:00AM. Please bring picture ID and medicaid card. Thank you.  [address] [place]  [address] [phone]         Subjective Data:  Chase Moss is a 52 y/o M with history of Bipolar I, PTSD, cocaine use disorder, and cannabis use disorder who presented voluntarily with worsening depression, SI, HI, worsening illicit substance use and medication non-adherence. Pt reports his symptoms were in the recent context of being evicted by his brother-in-law and becoming homeless. He also reported being off of his medications since December 2018. He was restarted on abilify and per his request also restarted on wellbutrin. Remeron was added to his regimen to help with sleep as wellas prazosin to help with nightmares. Pt has been reporting incremental improvement of his presenting mood symptoms. Pt has been working towards arranging his own outpatient follow up for substance treatment at [facility].  Today upon evaluation, pt shares, "I'm feeling a lot better today. Last night was the best night of sleep I've had in a long time." Pt denies SI/HI/AH/VH. He previously had reported HI towards his brother in law, but today he shares, "I don't really want to do that - I'm mad at him, but I don't want to hurt anybody." Pt shares that his medications have been helpful and he has no physical complaints. He is sleeping well. His appetite is good. He is in agreement to continue his current treatment regimen without changes. He still plans to attend [facility] for substance treatment starting this afternoon. He was able to engage in safety planning including plan to return to [facility] or contact emergency services if he feels unable to maintain his own safety or the safety of others. Pt had no further questions, comments, or concerns.    Plan Of Care/Follow-up recommendations:   -Discharge to outpatient level of care  -Bipolar I   - Continue Abilify to 15 mg QDAY for mood disorder   -Continue Remeron 15 mg  QHS for sleep , depression, anxiety.   -Continue Wellbutrin XL 300 mg Daily for depression  -Anxiety    -Continue Vistaril 50 mg PO Q6H PRN for anxiety.  -Insomnia   -Continue Trazodone 150 mg QHS PRN  for insomnia.  -PTSD   -Continue Prazosin 2 mg  QHS for PTSD associated nightmares.  Activity:  as tolerated Diet:  normal Tests:  NA Other:  see above for DC plan  Micheal Likens, MD 02/19/2018, 9:00 AM

## 2018-02-19 NOTE — B[facility] Group Notes (Signed)
Adult Psychoeducational Group Note  Date:  02/19/2018 Time:  10:35 AM  Group Topic/Focus:  Primary and Secondary Emotions:   The focus of this group is to discuss the difference between primary and secondary emotions.  Participation Level:  Active  Participation Quality:  Appropriate  Affect:  Appropriate  Cognitive:  Appropriate  Insight: Appropriate  Engagement in Group:  Developing/Improving  Modes of Intervention:  Clarification  Additional Comments:    [name] 02/19/2018, 10:35 AM

## 2018-02-19 NOTE — Progress Notes (Signed)
Recreation Therapy Notes  Date: 02/19/18 Time: 0930 Location: 300 Hall Dayroom  Group Topic: Stress Management  Goal Area(s) Addresses:  Patient will verbalize importance of using healthy stress management.  Patient will identify positive emotions associated with healthy stress management.   Behavioral Response: Engaged  Intervention: Stress Management  Activity :  Progressive Muscle Relaxation.  LRT introduced the stress management technique of progressive muscle relaxation.  LRT read Moss script to guide the patients through the process of tensing and relaxing each muscle group individually.    Education:  Stress Management, Discharge Planning.   Education Outcome: Acknowledges edcuation/In group clarification offered/Needs additional education  Clinical Observations/Feedback: Pt attended group.     Chase Moss, LRT/CTRS         Chase RancherLindsay, Chase Moss 02/19/2018 11:52 AM

## 2018-02-19 NOTE — Discharge Summary (Addendum)
Physician Discharge Summary Note  Patient:  Chase DowGregory Moss is an 52 y.o., male  MRN:  [id number]  DOB:  04/16/1966  Patient phone:  [phone] (home)   Patient address:   [address] [place] [id number],   Total Time spent with patient: Greater than 30 minutes  Date of Admission:  02/11/2018  Date of Discharge: 02-19-18  Reason for Admission: Worsening symptoms of depression, suicidal/homicidal ideations & illicit drug use.   Principal Problem: Bipolar I disorder [facility](HCC)  Discharge Diagnoses: Patient Active Problem List   Diagnosis Date Noted  . Bipolar I disorder (HCC) [F31.9] 02/11/2018    Priority: High  . PTSD (post-traumatic stress disorder) [F43.10]     Priority: Medium  . Cannabis use disorder, mild, abuse [F12.10] 02/12/2018  . MDD (major depressive disorder) [F32.9] 12/11/2017  . Intentional overdose of drug in tablet form (HCC) [T50.902A] 11/08/2017  . Dyslipidemia [E78.5] 11/08/2017  . Constipation [K59.00] 11/08/2017  . Drug overdose [T50.901A] 11/05/2017  . Acute alcoholic intoxication without complication (HCC) [F10.929]   . Suicidal ideation [R45.851]   . Alcohol intoxication (HCC) [F10.929] 10/22/2017  . Alcohol withdrawal (HCC) [F10.239] 10/22/2017  . Polysubstance abuse (HCC) [F19.10]   . 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 Cocaine use disorder, moderate, dependence (HCC) [F14.20] 03/31/2017  . HTN (hypertension) [I10] 06/20/2016  . MDD (major depressive disorder), recurrent severe, without psychosis (HCC) [F33.2] 06/20/2016  . Tobacco use disorder [F17.200] 06/20/2016  . Trauma [T14.90XA] 07/27/2015  . Cannabis use disorder, moderate, dependence (HCC) [F12.20]   . Substance induced mood disorder (HCC) [F19.94] 07/07/2014  . Alcohol use disorder, moderate, dependence (HCC) [F10.20] 04/10/2012  . Suicidal thoughts [R45.851] 04/04/2012   Past Psychiatric History: Hx. Polysubstance  dependence, PTSD, MDD  Past Medical History:  Past Medical History:  Diagnosis Date  . Bipolar 1 disorder (HCC)   . Depression   . Hypertension   . PTSD (post-traumatic stress disorder)    History reviewed. No pertinent surgical history. Family History:  Family History  Problem Relation Age of Onset  . Heart attack Other    Family Psychiatric  History: See H&P  Social History:  Social History   Substance and Sexual Activity  Alcohol Use Yes  . Alcohol/week: 1.2 oz  . Types: 2 Cans of beer per week   Comment: daily alcohol     Social History   Substance and Sexual Activity  Drug Use Yes  . Types: Cocaine, Marijuana   Comment: several times a day- pt reports hasn't used since detoxic     Social History   Socioeconomic History  . Marital status: Divorced    Spouse name: None  . Number of children: None  . Years of education: None  . Highest education level: None  Social Needs  . Financial resource strain: None  . Food insecurity - worry: None  . Food insecurity - inability: None  . Transportation needs - medical: None  . Transportation needs - non-medical: None  Occupational History  . None  Tobacco Use  . Smoking status: Current Every Day Smoker    Packs/day: 1.00    Years: 30.00    Pack years: 30.00    Types: Cigarettes  . Smokeless tobacco: Never Used  Substance and Sexual Activity  . Alcohol use: Yes    Alcohol/week: 1.2 oz    Types: 2 Cans of beer per week    Comment: daily alcohol  . Drug use: Yes    Types:  Cocaine, Marijuana    Comment: several times a day- pt reports hasn't used since detoxic   . Sexual activity: Yes  Other Topics Concern  . None  Social History Narrative  . None   Hospital Course: (Per Md's SRA): Chase Moss is a 52 y/o M with history of Bipolar I, PTSD, cocaine use disorder and cannabis use disorder who presented voluntarily with worsening depression, SI,HI,worsening illicit substance use and medication non-adherence.  Pt reports his symptoms were in the recent context of being evicted by his brother-in-law and becoming homeless. He also reported being off of his medications since December 2018. He was restarted on abilify and per his request also restarted on wellbutrin. Remeron was added to his regimen to help with sleep as wellas prazosin to help with nightmares. Pt has been reporting incremental improvement of his presenting mood symptoms. Pt has been working towards arranging his own outpatient follow up for substance treatment at [facility]  After the admission assessment, it was determined based on his symptoms that Chase Moss will need mood stabilization treatments. And with his consent, the medication regimen targeting those presenting symptoms were dicussed with him & initiated. He was medicated & discharged on; Abilify 15 mg for mood control, Wellbutrin XL 300 mg for depression,  Hydroxyzine 50 mg prn for anxiety, Mirtazapine 15 mg for depression/insomnia & Prazosin 2 mg for PTSD related nightmares. He was also enrolled in the group counseling sessions being offered & held on this unit. He learned coping skills. He also received other medication regimen for the other pre-existing medical issues presented. He tolerated his treatment regimen without any adverse effects or reactions reported.  Chase Moss's symptoms responded well to his treatment regimen. This is evidenced by his reports of improved mood, presentation of good affect, absence of suicidal/homicidal ideations & or substance withdrawal symptoms. He  is seen by his attending psychiatrist today. He is pleased that he sought help. Says he is tolerating his medications well. No withdrawal symptoms. No craving for alcohol or other drugs. He is no longer feeling depressed. Describes normal energy and ability to think. Able to focus on task. No suicidal thoughts. No homicidal thoughts. No thoughts of violence. Patient reports normal biological functions.   The nursing  staff reports that patient has been appropriate on the unit. Patient has been interacting well with peers & staff. No behavioral issues. Patient has not been observed to be internally stimulated or pre-occupied. Patient has been adherent to his treatment recommendations. Patient has been tolerating his medication well, denies any adverse reactions or side effects. Chase Moss's case was discussed at the team meeting this morning. The team members feel that patient is back to hisbaseline level of function. The team agrees with plan to discharge patient today to continue mental health care & substance abuse treatment at the [facility] treatment center.   Upon discharge, Chase Moss adamantly denies any SIHI, AVH, delusional thoughts, paranoia or substance withdrawal symptoms. He will continue further psychiatric follow-up care/medication management on an outpatient basis as noted below. He was provided with all the necessary information needed to make this appointment without problems. He left [facility] with all personal belongings in no apparent distress. Transportation per [employer] staff Maurine Minister).  Physical Findings: AIMS: Facial and Oral Movements Muscles of Facial Expression: None, normal Lips and Perioral Area: None, normal Jaw: None, normal Tongue: None, normal,Extremity Movements Upper (arms, wrists, hands, fingers): None, normal Lower (legs, knees, ankles, toes): None, normal, Trunk Movements Neck, shoulders, hips: None, normal, Overall Severity Severity of  abnormal movements (highest score from questions above): None, normal Incapacitation due to abnormal movements: None, normal Patient's awareness of abnormal movements (rate only patient's report): No Awareness, Dental Status Current problems with teeth and/or dentures?: No Does patient usually wear dentures?: No  CIWA:  CIWA-Ar Total: 2 COWS:  COWS Total Score: 1  Musculoskeletal: Strength & Muscle Tone: within normal limits Gait & Station: normal Patient  leans: N/A  Psychiatric Specialty Exam: Physical Exam  Constitutional: He appears well-developed.  HENT:  Head: Normocephalic.  Eyes: Pupils are equal, round, and reactive to light.  Neck: Normal range of motion.  Cardiovascular: Normal rate.  Respiratory: Effort normal.  GI: Soft.  Genitourinary:  Genitourinary Comments: Deferred  Musculoskeletal: Normal range of motion.  Neurological: He is alert.  Skin: Skin is warm.    Review of Systems  Constitutional: Negative.   HENT: Negative.   Eyes: Negative.   Respiratory: Negative.   Cardiovascular: Negative.   Gastrointestinal: Negative.   Genitourinary: Negative.   Musculoskeletal: Negative.   Skin: Negative.   Endo/Heme/Allergies: Negative.   Psychiatric/Behavioral: Positive for depression (Stable) and substance abuse (Hx. polysubstance dependence). Negative for hallucinations, memory loss and suicidal ideas. The patient has insomnia (Stable). The patient is not nervous/anxious.     Blood pressure 115/88, pulse 93, temperature 98.7 F (37.1 C), temperature source Oral, resp. rate 20, height 5\' 9"  (1.753 m), weight 80.3 kg (177 lb), SpO2 98 %.Body mass index is 26.14 kg/m.  See Md's SRA   Have you used any form of tobacco in the last 30 days? (Cigarettes, Smokeless Tobacco, Cigars, and/or Pipes): Yes  Has this patient used any form of tobacco in the last 30 days? (Cigarettes, Smokeless Tobacco, Cigars, and/or Pipes):  No  Blood Alcohol level:  Lab Results  Component Value Date   ETH 127 (H) 02/10/2018   ETH <10 12/11/2017   Metabolic Disorder Labs:  Lab Results  Component Value Date   HGBA1C 5.2 06/21/2016   MPG 100 04/09/2012   Lab Results  Component Value Date   PROLACTIN 21.0 (H) 06/21/2016   Lab Results  Component Value Date   CHOL 152 11/07/2017   TRIG 142 11/07/2017   HDL 40 (L) 11/07/2017   CHOLHDL 3.8 11/07/2017   VLDL 28 11/07/2017   LDLCALC 84 11/07/2017   LDLCALC 88 06/21/2016   See Psychiatric  Specialty Exam and Suicide Risk Assessment completed by Attending Physician prior to discharge.  Discharge destination:  Home  Is patient on multiple antipsychotic therapies at discharge:  No   Has Patient had three or more failed trials of antipsychotic monotherapy by history:  No  Recommended Plan for Multiple Antipsychotic Therapies: NA  Allergies as of 02/19/2018   No Known Allergies     Medication List    STOP taking these medications   chlorproMAZINE 50 MG tablet Commonly known as:  THORAZINE   traZODone 150 MG tablet Commonly known as:  DESYREL     TAKE these medications     Indication  amLODipine 5 MG tablet Commonly known as:  NORVASC Take 1 tablet (5 mg total) by mouth daily. For high blood pressure  Indication:  High Blood Pressure Disorder   ARIPiprazole 15 MG tablet Commonly known as:  ABILIFY Take 1 tablet (15 mg total) by mouth daily. For mood control  Indication:  Mood control   buPROPion 300 MG 24 hr tablet Commonly known as:  WELLBUTRIN XL Take 1 tablet (300 mg total) by mouth daily. For depression What changed:  medication strength  how much to take  Indication:  Major Depressive Disorder   hydrOXYzine 50 MG tablet Commonly known as:  ATARAX/VISTARIL Take 1 tablet (50 mg) by mouth four times daily as needed: For anxiety/sleep What changed:    medication strength  additional instructions  Indication:  Feeling Anxious, Insomnia   lisinopril 10 MG tablet Commonly known as:  PRINIVIL,ZESTRIL Take 1 tablet (10 mg total) by mouth daily. For high blood pressure What changed:    medication strength  how much to take  Indication:  High Blood Pressure Disorder   mirtazapine 15 MG tablet Commonly known as:  REMERON Take 1 tablet (15 mg total) by mouth at bedtime. For depression/sleep  Indication:  Major Depressive Disorder, Insomnia   prazosin 2 MG capsule Commonly known as:  MINIPRESS Take 1 capsule (2 mg total) by mouth at bedtime.  For PTSD nightmares  Indication:  PTSD related nightmares      Follow-up Information    [facility] Follow up on 02/20/2018.   Specialty:  Behavioral Health Why:  Hospital follow-up at 8:00AM. Please bring picture ID and medicaid card. Thank you.  [address] [place] [address] [phone]          Follow-up recommendations: Activity:  As tolerated Diet: As recommended by your primary care doctor. Keep all scheduled follow-up appointments as recommended.    Comments: Patient is instructed prior to discharge to: Take all medications as prescribed by his/her mental healthcare provider. Report any adverse effects and or reactions from the medicines to his/her outpatient provider promptly. Patient has been instructed & cautioned: To not engage in alcohol and or illegal drug use while on prescription medicines. In the event of worsening symptoms, patient is instructed to call the crisis hotline, 911 and or go to the nearest ED for appropriate evaluation and treatment of symptoms. To follow-up with his/her primary care provider for your other medical issues, concerns and or health care needs.   Signed: Armandina Stammer, NP, PMHNP, FNP-BC 02/20/2018, 8:54 AM   Patient seen, Suicide Assessment Completed.  Disposition Plan Reviewed

## 2018-02-19 NOTE — Progress Notes (Signed)
Patient ID: Chase Moss, male   DOB: 09/20/1966, 52 y.o.   MRN: [id number]  DAR: Pt. Denies SI/HI and A/V Hallucinations. He reports that his sleep last night was good, his appetite is fair, his energy level is normal, and his concentration is good. Patient does not report any pain or discomfort at this time. Support and encouragement provided to the patient. Scheduled medications administered to patient per physician's orders. Patient is receptive and cooperative. He does continue to present as anxious in affect and mood however rates it at 7/10 today. He rates his depression and hopelessness levels 5/10. He is seen in the milieu interacting with his peers and reports that he is ready for discharge soon. Q15 minute checks are maintained for safety.

## 2018-03-28 ENCOUNTER — Emergency Department (HOSPITAL_COMMUNITY)
Admission: EM | Admit: 2018-03-28 | Discharge: 2018-03-29 | Disposition: A | Discharge status: Home / Self Care | Payer: Medicaid Other | Attending: Emergency Medicine | Admitting: Emergency Medicine

## 2018-03-28 DIAGNOSIS — F17210 Nicotine dependence, cigarettes, uncomplicated: Secondary | ICD-10-CM | POA: Diagnosis not present

## 2018-03-28 DIAGNOSIS — F1014 Alcohol abuse with alcohol-induced mood disorder: Secondary | ICD-10-CM | POA: Diagnosis present

## 2018-03-28 DIAGNOSIS — R45851 Suicidal ideations: Secondary | ICD-10-CM | POA: Insufficient documentation

## 2018-03-28 DIAGNOSIS — F10920 Alcohol use, unspecified with intoxication, uncomplicated: Secondary | ICD-10-CM | POA: Diagnosis present

## 2018-03-28 DIAGNOSIS — I10 Essential (primary) hypertension: Secondary | ICD-10-CM | POA: Insufficient documentation

## 2018-03-28 DIAGNOSIS — Y907 Blood alcohol level of 200-239 mg/100 ml: Secondary | ICD-10-CM | POA: Insufficient documentation

## 2018-03-28 DIAGNOSIS — F319 Bipolar disorder, unspecified: Secondary | ICD-10-CM | POA: Diagnosis not present

## 2018-03-28 DIAGNOSIS — Z79899 Other long term (current) drug therapy: Secondary | ICD-10-CM | POA: Insufficient documentation

## 2018-03-28 DIAGNOSIS — F1414 Cocaine abuse with cocaine-induced mood disorder: Secondary | ICD-10-CM | POA: Insufficient documentation

## 2018-03-28 DIAGNOSIS — F4312 Post-traumatic stress disorder, chronic: Secondary | ICD-10-CM | POA: Diagnosis not present

## 2018-03-28 DIAGNOSIS — F1020 Alcohol dependence, uncomplicated: Secondary | ICD-10-CM | POA: Insufficient documentation

## 2018-03-29 ENCOUNTER — Encounter (HOSPITAL_COMMUNITY): Payer: Self-pay | Admitting: *Deleted

## 2018-03-29 ENCOUNTER — Other Ambulatory Visit: Payer: Self-pay

## 2018-03-29 DIAGNOSIS — F1014 Alcohol abuse with alcohol-induced mood disorder: Secondary | ICD-10-CM | POA: Diagnosis present

## 2018-03-29 LAB — RAPID URINE DRUG SCREEN, HOSP PERFORMED
AMPHETAMINES: NOT DETECTED
BARBITURATES: NOT DETECTED
BENZODIAZEPINES: NOT DETECTED
COCAINE: POSITIVE — AB
Opiates: NOT DETECTED
TETRAHYDROCANNABINOL: POSITIVE — AB

## 2018-03-29 LAB — CBC
HEMATOCRIT: 48.5 % (ref 39.0–52.0)
HEMOGLOBIN: 16.4 g/dL (ref 13.0–17.0)
MCH: 30.5 pg (ref 26.0–34.0)
MCHC: 33.8 g/dL (ref 30.0–36.0)
MCV: 90.1 fL (ref 78.0–100.0)
Platelets: 250 10*3/uL (ref 150–400)
RBC: 5.38 MIL/uL (ref 4.22–5.81)
RDW: 13.5 % (ref 11.5–15.5)
WBC: 9.5 10*3/uL (ref 4.0–10.5)

## 2018-03-29 LAB — COMPREHENSIVE METABOLIC PANEL
ALBUMIN: 3.9 g/dL (ref 3.5–5.0)
ALK PHOS: 87 U/L (ref 38–126)
ALT: 18 U/L (ref 17–63)
ANION GAP: 12 (ref 5–15)
AST: 16 U/L (ref 15–41)
BUN: 15 mg/dL (ref 6–20)
CO2: 23 mmol/L (ref 22–32)
Calcium: 8.8 mg/dL — ABNORMAL LOW (ref 8.9–10.3)
Chloride: 105 mmol/L (ref 101–111)
Creatinine, Ser: 0.97 mg/dL (ref 0.61–1.24)
GFR calc Af Amer: >60 mL/min (ref >60)
GFR calc non Af Amer: >60 mL/min (ref >60)
GLUCOSE: 95 mg/dL (ref 65–99)
Potassium: 3.4 mmol/L — ABNORMAL LOW (ref 3.5–5.1)
SODIUM: 140 mmol/L (ref 135–145)
Total Bilirubin: 0.6 mg/dL (ref 0.3–1.2)
Total Protein: 7.1 g/dL (ref 6.5–8.1)

## 2018-03-29 LAB — ETHANOL: Alcohol, Ethyl (B): 200 mg/dL — ABNORMAL HIGH (ref <10)

## 2018-03-29 LAB — SALICYLATE LEVEL: Salicylate Lvl: <7 mg/dL (ref 2.8–30.0)

## 2018-03-29 LAB — ACETAMINOPHEN LEVEL: Acetaminophen (Tylenol), Serum: <10 ug/mL — ABNORMAL LOW (ref 10–30)

## 2018-03-29 MED ORDER — MIRTAZAPINE 30 MG PO TABS: 15.0000 mg | ORAL_TABLET | Freq: Every day | ORAL | Status: DC

## 2018-03-29 MED ORDER — AMLODIPINE BESYLATE 5 MG PO TABS
5.0000 mg | ORAL_TABLET | Freq: Every day | ORAL | Status: DC
  Administered 2018-03-29: 5 mg via ORAL
  Filled 2018-03-29: qty 1

## 2018-03-29 MED ORDER — LORAZEPAM 2 MG/ML IJ SOLN: 0.0000 mg | Freq: Two times a day (BID) | INTRAMUSCULAR | Status: DC

## 2018-03-29 MED ORDER — THIAMINE HCL 100 MG/ML IJ SOLN: 100.0000 mg | Freq: Every day | INTRAMUSCULAR | Status: DC

## 2018-03-29 MED ORDER — PRAZOSIN HCL 1 MG PO CAPS: 2.0000 mg | ORAL_CAPSULE | Freq: Every day | ORAL | Status: DC

## 2018-03-29 MED ORDER — VITAMIN B-1 100 MG PO TABS
100.0000 mg | ORAL_TABLET | Freq: Every day | ORAL | Status: DC
  Administered 2018-03-29: 100 mg via ORAL
  Filled 2018-03-29: qty 1

## 2018-03-29 MED ORDER — GABAPENTIN 300 MG PO CAPS
300.0000 mg | ORAL_CAPSULE | Freq: Two times a day (BID) | ORAL | Status: DC
  Administered 2018-03-29: 300 mg via ORAL
  Filled 2018-03-29: qty 1

## 2018-03-29 MED ORDER — GABAPENTIN 300 MG PO CAPS: 300.0000 mg | ORAL_CAPSULE | Freq: Three times a day (TID) | ORAL | 0 refills | Status: DC

## 2018-03-29 MED ORDER — ARIPIPRAZOLE 5 MG PO TABS
15.0000 mg | ORAL_TABLET | Freq: Every day | ORAL | Status: DC
  Administered 2018-03-29: 11:00:00 15 mg via ORAL
  Filled 2018-03-29: qty 1

## 2018-03-29 MED ORDER — LISINOPRIL 10 MG PO TABS
10.0000 mg | ORAL_TABLET | Freq: Every day | ORAL | Status: DC
  Administered 2018-03-29: 10 mg via ORAL
  Filled 2018-03-29: qty 1

## 2018-03-29 MED ORDER — HYDROXYZINE HCL 25 MG PO TABS: 50.0000 mg | ORAL_TABLET | Freq: Four times a day (QID) | ORAL | Status: DC | PRN

## 2018-03-29 MED ORDER — BUPROPION HCL ER (XL) 150 MG PO TB24
300.0000 mg | ORAL_TABLET | Freq: Every day | ORAL | Status: DC
  Administered 2018-03-29: 300 mg via ORAL
  Filled 2018-03-29: qty 2

## 2018-03-29 MED ORDER — LORAZEPAM 1 MG PO TABS: 0.0000 mg | ORAL_TABLET | Freq: Two times a day (BID) | ORAL | Status: DC

## 2018-03-29 MED ORDER — LORAZEPAM 1 MG PO TABS: 0.0000 mg | ORAL_TABLET | Freq: Four times a day (QID) | ORAL | Status: DC

## 2018-03-29 MED ORDER — LORAZEPAM 2 MG/ML IJ SOLN: 0.0000 mg | Freq: Four times a day (QID) | INTRAMUSCULAR | Status: DC

## 2018-03-29 NOTE — BHH Counselor (Addendum)
Clinician attempted to engage the pt in assessment to no avail. Clinician spoke to Dr. Elesa MassedWard and expressed the pt did not respond to verbal stimuli and the TTS consult will be completed once pt is roused/alert. Discussed with Darel HongJudy, RN.  Redmond Pullingreylese D Maxamilian Amadon, MS, [facility], [facility] Triage Specialist [phone]

## 2018-03-29 NOTE — ED Provider Notes (Signed)
TIME SEEN: 1:22 AM  CHIEF COMPLAINT: Suicidal  HPI: Patient is a 52 year old male with history of hypertension, depression, bipolar disorder who presents to the emergency department with suicidal thoughts for the past day.  Denies any plan.  Has had history of previous suicide attempt.  Denies HI or hallucinations.  Has been drinking alcohol today.  States that he does not drink alcohol every day.  No history of seizures from withdrawal or DTs.  He has no complaints of pain at this time.  No fever, cough, vomiting or diarrhea.  ROS: See HPI Constitutional: no fever  Eyes: no drainage  ENT: no runny nose   Cardiovascular:  no chest pain  Resp: no SOB  GI: no vomiting GU: no dysuria Integumentary: no rash  Allergy: no hives  Musculoskeletal: no leg swelling  Neurological: no slurred speech ROS otherwise negative  PAST MEDICAL HISTORY/PAST SURGICAL HISTORY:  Past Medical History:  Diagnosis Date  . Bipolar 1 disorder (HCC)   . Depression   . Hypertension   . PTSD (post-traumatic stress disorder)     MEDICATIONS:  Prior to Admission medications   Medication Sig Start Date End Date Taking? Authorizing Provider  amLODipine (NORVASC) 5 MG tablet Take 1 tablet (5 mg total) by mouth daily. For high blood pressure 02/20/18  Yes Nwoko, Agnes I, NP  ARIPiprazole (ABILIFY) 15 MG tablet Take 1 tablet (15 mg total) by mouth daily. For mood control 02/20/18  Yes Nwoko, Nicole KindredAgnes I, NP  buPROPion (WELLBUTRIN XL) 300 MG 24 hr tablet Take 1 tablet (300 mg total) by mouth daily. For depression 02/20/18  Yes Armandina StammerNwoko, Agnes I, NP  hydrOXYzine (ATARAX/VISTARIL) 50 MG tablet Take 1 tablet (50 mg) by mouth four times daily as needed: For anxiety/sleep 02/19/18  Yes Armandina StammerNwoko, Agnes I, NP  lisinopril (PRINIVIL,ZESTRIL) 10 MG tablet Take 1 tablet (10 mg total) by mouth daily. For high blood pressure 02/20/18  Yes Nwoko, Agnes I, NP  mirtazapine (REMERON) 15 MG tablet Take 1 tablet (15 mg total) by mouth at bedtime. For  depression/sleep 02/19/18  Yes Armandina StammerNwoko, Agnes I, NP  prazosin (MINIPRESS) 2 MG capsule Take 1 capsule (2 mg total) by mouth at bedtime. For PTSD nightmares 02/19/18  Yes Sanjuana KavaNwoko, Agnes I, NP    ALLERGIES:  No Known Allergies  SOCIAL HISTORY:  Social History   Tobacco Use  . Smoking status: Current Every Day Smoker    Packs/day: 1.00    Years: 30.00    Pack years: 30.00    Types: Cigarettes  . Smokeless tobacco: Never Used  Substance Use Topics  . Alcohol use: Yes    Alcohol/week: 1.2 oz    Types: 2 Cans of beer per week    Comment: Pt stated "I drink 3 40's a day."    FAMILY HISTORY: Family History  Problem Relation Age of Onset  . Heart attack Other     EXAM: BP 130/84 (BP Location: Right Arm)   Pulse 84   Temp 98.2 F (36.8 C) (Oral)   Resp 18   Ht 5\' 9"  (1.753 m)   Wt 83.9 kg (185 lb)   SpO2 97%   BMI 27.32 kg/m  CONSTITUTIONAL: Alert and oriented and responds appropriately to questions. Well-appearing; well-nourished, smells of alcohol HEAD: Normocephalic EYES: Conjunctivae clear, pupils appear equal, EOMI ENT: normal nose; moist mucous membranes NECK: Supple, no meningismus, no nuchal rigidity, no LAD  CARD: RRR; S1 and S2 appreciated; no murmurs, no clicks, no rubs, no gallops RESP: Normal  chest excursion without splinting or tachypnea; breath sounds clear and equal bilaterally; no wheezes, no rhonchi, no rales, no hypoxia or respiratory distress, speaking full sentences ABD/GI: Normal bowel sounds; non-distended; soft, non-tender, no rebound, no guarding, no peritoneal signs, no hepatosplenomegaly BACK:  The back appears normal and is non-tender to palpation, there is no CVA tenderness EXT: Normal ROM in all joints; non-tender to palpation; no edema; normal capillary refill; no cyanosis, no calf tenderness or swelling    SKIN: Normal color for age and race; warm; no rash NEURO: Moves all extremities equally PSYCH: Endorses suicidal thoughts without plan.  No HI.   No  MEDICAL DECISION MAKING: Patient here with suicidal thoughts.  Labs show alcohol level of 200.  Otherwise labs unremarkable.  He is hemodynamically stable without complaints.  He is here voluntarily.  At this time he is medically cleared.  Will consult TTS.  ED PROGRESS: TTS to eval in AM when more awake.  I reviewed all nursing notes, vitals, pertinent previous records, EKGs, lab and urine results, imaging (as available).      Ward, Layla Maw, DO 03/29/18 [phone]

## 2018-03-29 NOTE — B[facility] Suicide Risk Assessment (Signed)
Suicide Risk Assessment  Discharge Assessment   [facility]   Principal Problem: Cocaine abuse with cocaine-induced mood disorder [facility](HCC) Discharge Diagnoses:  Patient Active Problem List   Diagnosis Date Noted  . Cocaine abuse with cocaine-induced mood disorder (HCC) [F14.14] 04/01/2017    Priority: High  . Alcohol use disorder, moderate, dependence (HCC) [F10.20] 04/10/2012    Priority: High  . Alcohol abuse with alcohol-induced mood disorder (HCC) [F10.14] 03/29/2018  . Cannabis use disorder, mild, abuse [F12.10] 02/12/2018  . Bipolar I disorder (HCC) [F31.9] 02/11/2018  . MDD (major depressive disorder) [F32.9] 12/11/2017  . Intentional overdose of drug in tablet form (HCC) [T50.902A] 11/08/2017  . Dyslipidemia [E78.5] 11/08/2017  . Constipation [K59.00] 11/08/2017  . Drug overdose [T50.901A] 11/05/2017  . Acute alcoholic intoxication without complication (HCC) [F10.920]   . Suicidal ideation [R45.851]   . Alcohol intoxication (HCC) [F10.929] 10/22/2017  . Alcohol withdrawal (HCC) [F10.239] 10/22/2017  . Polysubstance abuse (HCC) [F19.10]   . 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 HTN (hypertension) [I10] 06/20/2016  . Tobacco use disorder [F17.200] 06/20/2016  . Trauma [T14.90XA] 07/27/2015  . Cannabis use disorder, moderate, dependence (HCC) [F12.20]   . PTSD (post-traumatic stress disorder) [F43.10]   . Substance induced mood disorder (HCC) [F19.94] 07/07/2014  . Suicidal thoughts [R45.851] 04/04/2012    Total Time spent with patient: 45 minutes  Musculoskeletal: Strength & Muscle Tone: within normal limits Gait & Station: normal Patient leans: N/A  Psychiatric Specialty Exam:   Blood pressure (!) 166/91, pulse 90, temperature 98.2 F (36.8 C), temperature source Oral, resp. rate 18, height 5\' 9"  (1.753 m), weight 83.9 kg (185 lb), SpO2 97 %.Body mass index is 27.32 kg/m.  General  Appearance: Casual  Eye Contact::  Good  Speech:  Normal Rate409  Volume:  Normal  Mood:  Irritable  Affect:  Congruent  Thought Process:  Coherent and Descriptions of Associations: Intact  Orientation:  Full (Time, Place, and Person)  Thought Content:  WDL and Logical  Suicidal Thoughts:  No  Homicidal Thoughts:  No  Memory:  Immediate;   Good Recent;   Good Remote;   Good  Judgement:  Fair  Insight:  Fair  Psychomotor Activity:  Normal  Concentration:  Good  Recall:  Good  Fund of Knowledge:Fair  Language: Good  Akathisia:  No  Handed:  Right  AIMS (if indicated):     Assets:  Leisure Time Physical Health Resilience Social Support  Sleep:     Cognition: WNL  ADL's:  Intact   Mental Status Per Nursing Assessment::   On Admission:   52 yo male who presented to the ED with alcohol and cocaine abuse with suicidal ideations.  [facility]  He did not follow-up after this with his outpatient resources but unfortunately started reusing again.  Peer support consult placed with provision of outpatient resources.  No suicidal/homicidal ideations, hallucinations, or withdrawal symptoms.  STable for discharge.  Demographic Factors:  Male and Caucasian  Loss Factors: NA  Historical Factors: NA  Risk Reduction Factors:   Sense of responsibility to family, Positive social support and Positive therapeutic relationship  Continued Clinical Symptoms:  Irritable  Cognitive Features That Contribute To Risk:  None    Suicide Risk:  Minimal: No identifiable suicidal ideation.  Patients presenting with no risk factors but with morbid ruminations; may be classified as minimal risk based on the severity  of the depressive symptoms    Plan Of Care/Follow-up recommendations:  Activity:  as tolerated Diet:  heart healthy diet  LORD, JAMISON, NP 03/29/2018, 12:52 PM

## 2018-03-29 NOTE — ED Notes (Signed)
Pt discharged home. Discharged instructions read to pt who verbalized understanding. All belongings returned to pt who signed for same. Denies SI/HI, is not delusional and not responding to internal stimuli. Escorted pt to the ED exit.    

## 2018-03-29 NOTE — Patient Outreach (Signed)
[facility] met with the patient and provided substance use recovery support. Patient states that he wants detox for alcohol. [facility] talked to the patient about [facility] for detox and also talked to the patient about [facility] for detox since the patient has medicaid. [facility] provided information for those resources and also other information for residential/outpatient substance use treatment services in the [place] area. [facility] also provided an AA/NA meeting list and [facility] contact information. [facility] encouraged the patient to talk to people at those meetings for substance use recovery support and talk to another person in recovery who could possibly help with transportation. [facility] encouraged the patient to contact [facility] at any time for further help with substance use recovery resources or for substance use recovery support.
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
## 2018-03-29 NOTE — ED Triage Notes (Signed)
Pt brought in by [facility] with c/o being suicidal without plan.

## 2018-03-29 NOTE — ED Notes (Signed)
3 bags belongings placed in locker 28, pt changed out into burgundy scrubs

## 2018-03-29 NOTE — ED Notes (Signed)
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  I haven't had my meds in about a month.  I was in a program about a month but I left it tonight.  I just ran out of my meds."

## 2018-03-29 NOTE — BH Assessment (Signed)
[facility] Assessment Progress Note Patient was evaluated by Jannifer FranklinAkintayo MD, Cresenciano GenreLord DNP who recommended patient be discharged later this date and follow up with OP resources that will be provided by peer support. Peer support has been consulted and will meet with patient prior to discharge.

## 2018-05-01 ENCOUNTER — Emergency Department (HOSPITAL_COMMUNITY)
Admission: EM | Admit: 2018-05-01 | Discharge: 2018-05-01 | Disposition: A | Discharge status: Home / Self Care | Payer: Medicaid Other | Attending: Emergency Medicine | Admitting: Emergency Medicine

## 2018-05-01 ENCOUNTER — Encounter (HOSPITAL_COMMUNITY): Payer: Self-pay | Admitting: Emergency Medicine

## 2018-05-01 DIAGNOSIS — F319 Bipolar disorder, unspecified: Secondary | ICD-10-CM | POA: Diagnosis not present

## 2018-05-01 DIAGNOSIS — F419 Anxiety disorder, unspecified: Secondary | ICD-10-CM | POA: Diagnosis not present

## 2018-05-01 DIAGNOSIS — Z760 Encounter for issue of repeat prescription: Secondary | ICD-10-CM | POA: Insufficient documentation

## 2018-05-01 DIAGNOSIS — R450 Nervousness: Secondary | ICD-10-CM

## 2018-05-01 DIAGNOSIS — F515 Nightmare disorder: Secondary | ICD-10-CM | POA: Diagnosis not present

## 2018-05-01 DIAGNOSIS — R45850 Homicidal ideations: Secondary | ICD-10-CM | POA: Insufficient documentation

## 2018-05-01 DIAGNOSIS — F329 Major depressive disorder, single episode, unspecified: Secondary | ICD-10-CM | POA: Diagnosis not present

## 2018-05-01 DIAGNOSIS — F4325 Adjustment disorder with mixed disturbance of emotions and conduct: Secondary | ICD-10-CM | POA: Diagnosis not present

## 2018-05-01 DIAGNOSIS — I10 Essential (primary) hypertension: Secondary | ICD-10-CM | POA: Insufficient documentation

## 2018-05-01 DIAGNOSIS — F1490 Cocaine use, unspecified, uncomplicated: Secondary | ICD-10-CM | POA: Diagnosis not present

## 2018-05-01 DIAGNOSIS — F4310 Post-traumatic stress disorder, unspecified: Secondary | ICD-10-CM | POA: Diagnosis not present

## 2018-05-01 DIAGNOSIS — F17210 Nicotine dependence, cigarettes, uncomplicated: Secondary | ICD-10-CM | POA: Insufficient documentation

## 2018-05-01 DIAGNOSIS — R45851 Suicidal ideations: Secondary | ICD-10-CM | POA: Insufficient documentation

## 2018-05-01 DIAGNOSIS — F1099 Alcohol use, unspecified with unspecified alcohol-induced disorder: Secondary | ICD-10-CM

## 2018-05-01 DIAGNOSIS — Z79899 Other long term (current) drug therapy: Secondary | ICD-10-CM | POA: Diagnosis not present

## 2018-05-01 DIAGNOSIS — F1414 Cocaine abuse with cocaine-induced mood disorder: Secondary | ICD-10-CM | POA: Diagnosis present

## 2018-05-01 LAB — ACETAMINOPHEN LEVEL

## 2018-05-01 LAB — URINALYSIS, ROUTINE W REFLEX MICROSCOPIC
BILIRUBIN URINE: NEGATIVE
Bacteria, UA: NONE SEEN
Glucose, UA: NEGATIVE mg/dL
KETONES UR: NEGATIVE mg/dL
NITRITE: NEGATIVE
Protein, ur: NEGATIVE mg/dL
Specific Gravity, Urine: 1.014 (ref 1.005–1.030)
pH: 6 (ref 5.0–8.0)

## 2018-05-01 LAB — RAPID URINE DRUG SCREEN, HOSP PERFORMED
Amphetamines: NOT DETECTED
Barbiturates: NOT DETECTED
Benzodiazepines: POSITIVE — AB
Cocaine: POSITIVE — AB
OPIATES: NOT DETECTED
Tetrahydrocannabinol: POSITIVE — AB

## 2018-05-01 LAB — COMPREHENSIVE METABOLIC PANEL
ALT: 16 U/L — ABNORMAL LOW (ref 17–63)
ANION GAP: 10 (ref 5–15)
AST: 18 U/L (ref 15–41)
Albumin: 4.1 g/dL (ref 3.5–5.0)
Alkaline Phosphatase: 96 U/L (ref 38–126)
BUN: 13 mg/dL (ref 6–20)
CO2: 27 mmol/L (ref 22–32)
Calcium: 9.1 mg/dL (ref 8.9–10.3)
Chloride: 103 mmol/L (ref 101–111)
Creatinine, Ser: 1.26 mg/dL — ABNORMAL HIGH (ref 0.61–1.24)
GFR calc Af Amer: >60 mL/min (ref >60)
GFR calc non Af Amer: >60 mL/min (ref >60)
GLUCOSE: 93 mg/dL (ref 65–99)
POTASSIUM: 4.1 mmol/L (ref 3.5–5.1)
SODIUM: 140 mmol/L (ref 135–145)
Total Bilirubin: 0.6 mg/dL (ref 0.3–1.2)
Total Protein: 7.2 g/dL (ref 6.5–8.1)

## 2018-05-01 LAB — ETHANOL: Alcohol, Ethyl (B): <10 mg/dL (ref <10)

## 2018-05-01 LAB — SALICYLATE LEVEL: Salicylate Lvl: <7 mg/dL (ref 2.8–30.0)

## 2018-05-01 MED ORDER — MIRTAZAPINE 15 MG PO TABS: 15.0000 mg | ORAL_TABLET | Freq: Every day | ORAL | Status: DC

## 2018-05-01 MED ORDER — BUPROPION HCL ER (XL) 150 MG PO TB24
300.0000 mg | ORAL_TABLET | Freq: Every day | ORAL | Status: DC
  Administered 2018-05-01: 300 mg via ORAL
  Filled 2018-05-01: qty 2

## 2018-05-01 MED ORDER — PRAZOSIN HCL 2 MG PO CAPS: 2.0000 mg | ORAL_CAPSULE | Freq: Every day | ORAL | 0 refills | Status: DC

## 2018-05-01 MED ORDER — BUPROPION HCL ER (XL) 300 MG PO TB24: 300.0000 mg | ORAL_TABLET | Freq: Every day | ORAL | 0 refills | Status: DC

## 2018-05-01 MED ORDER — MIRTAZAPINE 15 MG PO TABS: 15.0000 mg | ORAL_TABLET | Freq: Every day | ORAL | 0 refills | Status: DC

## 2018-05-01 MED ORDER — PRAZOSIN HCL 1 MG PO CAPS: 2.0000 mg | ORAL_CAPSULE | Freq: Every day | ORAL | Status: DC

## 2018-05-01 NOTE — Consult Note (Addendum)
[facility] Consult   Reason for Consult:  Medication refills  Referring Physician:  EDP Patient Identification: Chase Moss MRN:  [id number] Principal Diagnosis: Adjustment disorder with disturbance of emotions Diagnosis:   Patient Active Problem List   Diagnosis Date Noted  . Cocaine abuse with cocaine-induced mood disorder ([place]) [F14.14] 04/01/2017    Priority: High  . Alcohol use disorder, moderate, dependence ([place]) [F10.20] 04/10/2012    Priority: Low  . Alcohol abuse with alcohol-induced mood disorder ([place]) [F10.14] 03/29/2018  . Cannabis use disorder, mild, abuse [F12.10] 02/12/2018  . Bipolar I disorder ([place]) [F31.9] 02/11/2018  . MDD (major depressive disorder) [F32.9] 12/11/2017  . Intentional overdose of drug in tablet form ([place]) [T50.902A] 11/08/2017  . Dyslipidemia [E78.5] 11/08/2017  . Constipation [K59.00] 11/08/2017  . Drug overdose [T50.901A] 11/05/2017  . Acute alcoholic intoxication without complication ([place]) [X52.841]   . Suicidal ideation [R45.851]   . Alcohol intoxication ([place]) [F10.929] 10/22/2017  . Alcohol withdrawal ([place]) [F10.239] 10/22/2017  . Polysubstance abuse ([place]) [F19.10]   . Major depressive disorder, recurrent, severe with psychotic features ([place]) [F33.3] 05/15/2017  . HTN (hypertension) [I10] 06/20/2016  . Tobacco use disorder [F17.200] 06/20/2016  . Trauma [T14.90XA] 07/27/2015  . Cannabis use disorder, moderate, dependence ([place]) [F12.20]   . PTSD (post-traumatic stress disorder) [F43.10]   . Substance induced mood disorder ([place]) [F19.94] 07/07/2014  . Suicidal thoughts [R45.851] 04/04/2012    Total Time spent with patient: 45 minutes  Subjective:   Chase Moss is a 52 y.o. male patient does not warrant admission.  HPI:  52 yo male who presented to the ED for medications refill.  He accidentally left Wellbutrin, Minipress, and Remeron medication refill at the bus stop when he went to his program.  When he realized it  and returned, they were gone.  No suicidal/homicidal ideations, hallucinations, and substance abuse.  He was at his program today and expressed his desire to kill the man who killed his mother in [place] on his 5th DWI.  Calm and cooperative on assessment and denies he has any plan or intent to proceed with these feelings.  The man is in [place] at this time.  Rx provided, stable for discharge.  Past Psychiatric History: depression, substance abuse  Risk to Self: Is patient at risk for suicide?: No Risk to Others:  none Prior Inpatient Therapy:  yes Prior Outpatient Therapy:  yes  Past Medical History:  Past Medical History:  Diagnosis Date  . Bipolar 1 disorder ([place])   . Depression   . Hypertension   . PTSD (post-traumatic stress disorder)    History reviewed. No pertinent surgical history. Family History:  Family History  Problem Relation Age of Onset  . Heart attack Other    Family Psychiatric  History: none Social History:  Social History   Substance and Sexual Activity  Alcohol Use Yes  . Alcohol/week: 1.2 oz  . Types: 2 Cans of beer per week   Comment: Pt stated "I drink 3 40's a day."     Social History   Substance and Sexual Activity  Drug Use Yes  . Types: Cocaine, Marijuana   Comment: several times a day- pt reports hasn't used since detoxic     Social History   Socioeconomic History  . Marital status: Divorced    Spouse name: Not on file  . Number of children: Not on file  . Years of education: Not on file  . Highest education level: Not on file  Occupational History  .  Not on file  Social Needs  . Financial resource strain: Not on file  . Food insecurity:    Worry: Not on file    Inability: Not on file  . Transportation needs:    Medical: Not on file    Non-medical: Not on file  Tobacco Use  . Smoking status: Current Every Day Smoker    Packs/day: 1.00    Years: 30.00    Pack years: 30.00    Types: Cigarettes  . Smokeless tobacco: Never Used   Substance and Sexual Activity  . Alcohol use: Yes    Alcohol/week: 1.2 oz    Types: 2 Cans of beer per week    Comment: Pt stated "I drink 3 40's a day."  . Drug use: Yes    Types: Cocaine, Marijuana    Comment: several times a day- pt reports hasn't used since detoxic   . Sexual activity: Yes  Lifestyle  . Physical activity:    Days per week: Not on file    Minutes per session: Not on file  . Stress: Not on file  Relationships  . Social connections:    Talks on phone: Not on file    Gets together: Not on file    Attends religious service: Not on file    Active member of club or organization: Not on file    Attends meetings of clubs or organizations: Not on file    Relationship status: Not on file  Other Topics Concern  . Not on file  Social History Narrative  . Not on file   Additional Social History: N/A    Allergies:  No Known Allergies  Labs:  Results for orders placed or performed during the hospital encounter of 05/01/18 (from the past 48 hour(s))  Urine rapid drug screen (hosp performed)     Status: Abnormal   Collection Time: 05/01/18  2:03 PM  Result Value Ref Range   Opiates NONE DETECTED NONE DETECTED   Cocaine POSITIVE (A) NONE DETECTED   Benzodiazepines POSITIVE (A) NONE DETECTED   Amphetamines NONE DETECTED NONE DETECTED   Tetrahydrocannabinol POSITIVE (A) NONE DETECTED   Barbiturates NONE DETECTED NONE DETECTED    Comment: (NOTE) DRUG SCREEN FOR MEDICAL PURPOSES ONLY.  IF CONFIRMATION IS NEEDED FOR ANY PURPOSE, NOTIFY LAB WITHIN 5 DAYS. LOWEST DETECTABLE LIMITS FOR URINE DRUG SCREEN Drug Class                     Cutoff (ng/mL) Amphetamine and metabolites    1000 Barbiturate and metabolites    200 Benzodiazepine                 340 Tricyclics and metabolites     300 Opiates and metabolites        300 Cocaine and metabolites        300 THC                            50 Performed at [facility], [place] [address], [place], [place] [address]   Urinalysis, Routine w reflex microscopic     Status: Abnormal   Collection Time: 05/01/18  2:03 PM  Result Value Ref Range   Color, Urine YELLOW YELLOW   APPearance CLEAR CLEAR   Specific Gravity, Urine 1.014 1.005 - 1.030   pH 6.0 5.0 - 8.0   Glucose, UA NEGATIVE NEGATIVE mg/dL   Hgb urine dipstick MODERATE (A) NEGATIVE  Bilirubin Urine NEGATIVE NEGATIVE   Ketones, ur NEGATIVE NEGATIVE mg/dL   Protein, ur NEGATIVE NEGATIVE mg/dL   Nitrite NEGATIVE NEGATIVE   Leukocytes, UA TRACE (A) NEGATIVE   RBC / HPF 6-10 0 - 5 RBC/hpf   WBC, UA 0-5 0 - 5 WBC/hpf   Bacteria, UA NONE SEEN NONE SEEN   Mucus PRESENT     Comment: Performed at [facility], [place] [address], [place], [place] [address]  Comprehensive metabolic panel     Status: Abnormal   Collection Time: 05/01/18  2:12 PM  Result Value Ref Range   Sodium 140 135 - 145 mmol/L   Potassium 4.1 3.5 - 5.1 mmol/L   Chloride 103 101 - 111 mmol/L   CO2 27 22 - 32 mmol/L   Glucose, Bld 93 65 - 99 mg/dL   BUN 13 6 - 20 mg/dL   Creatinine, Ser 1.26 (H) 0.61 - 1.24 mg/dL   Calcium 9.1 8.9 - 10.3 mg/dL   Total Protein 7.2 6.5 - 8.1 g/dL   Albumin 4.1 3.5 - 5.0 g/dL   AST 18 15 - 41 U/L   ALT 16 (L) 17 - 63 U/L   Alkaline Phosphatase 96 38 - 126 U/L   Total Bilirubin 0.6 0.3 - 1.2 mg/dL   GFR calc non Af Amer >60 >60 mL/min   GFR calc Af Amer >60 >60 mL/min    Comment: (NOTE) The eGFR has been calculated using the CKD EPI equation. This calculation has not been validated in all clinical situations. eGFR's persistently <60 mL/min signify possible Chronic Kidney Disease.    Anion gap 10 5 - 15    Comment: Performed at [facility], [place] [address], [place], [place] [address]    Current Facility-Administered Medications  Medication Dose Route Frequency Provider Last Rate Last Dose  . buPROPion (WELLBUTRIN XL) 24 hr tablet 300 mg  300 mg Oral Daily Lord, Jamison Y, NP       . mirtazapine (REMERON) tablet 15 mg  15 mg Oral QHS Patrecia Pour, NP      . prazosin (MINIPRESS) capsule 2 mg  2 mg Oral QHS Patrecia Pour, NP       Current Outpatient Medications  Medication Sig Dispense Refill  . amLODipine (NORVASC) 5 MG tablet Take 1 tablet (5 mg total) by mouth daily. For high blood pressure 30 tablet 0  . ARIPiprazole (ABILIFY) 15 MG tablet Take 1 tablet (15 mg total) by mouth daily. For mood control 30 tablet 0  . buPROPion (WELLBUTRIN XL) 300 MG 24 hr tablet Take 1 tablet (300 mg total) by mouth daily. For depression 30 tablet 0  . gabapentin (NEURONTIN) 300 MG capsule Take 1 capsule (300 mg total) by mouth 3 (three) times daily. 90 capsule 0  . hydrOXYzine (ATARAX/VISTARIL) 50 MG tablet Take 1 tablet (50 mg) by mouth four times daily as needed: For anxiety/sleep 75 tablet 0  . lisinopril (PRINIVIL,ZESTRIL) 10 MG tablet Take 1 tablet (10 mg total) by mouth daily. For high blood pressure 30 tablet 0  . mirtazapine (REMERON) 15 MG tablet Take 1 tablet (15 mg total) by mouth at bedtime. For depression/sleep 30 tablet 0  . prazosin (MINIPRESS) 2 MG capsule Take 1 capsule (2 mg total) by mouth at bedtime. For PTSD nightmares 30 capsule 0    Musculoskeletal: Strength & Muscle Tone: within normal limits Gait & Station: normal Patient leans: N/A  Psychiatric Specialty Exam: Physical Exam  Nursing note  and vitals reviewed. Constitutional: He is oriented to person, place, and time. He appears well-developed and well-nourished.  HENT:  Head: Normocephalic and atraumatic.  Neck: Normal range of motion.  Respiratory: Effort normal.  Musculoskeletal: Normal range of motion.  Neurological: He is alert and oriented to person, place, and time.  Psychiatric: His speech is normal and behavior is normal. Judgment and thought content normal. His mood appears anxious. Cognition and memory are normal.    Review of Systems  Psychiatric/Behavioral: The patient is  nervous/anxious.   All other systems reviewed and are negative.   Blood pressure (!) 144/95, pulse 71, temperature 97.6 F (36.4 C), temperature source Oral, resp. rate 18, SpO2 100 %.There is no height or weight on file to calculate BMI.  General Appearance: Casual  Eye Contact:  Good  Speech:  Normal Rate  Volume:  Normal  Mood:  Anxious, mild  Affect:  Congruent  Thought Process:  Coherent and Descriptions of Associations: Intact  Orientation:  Full (Time, Place, and Person)  Thought Content:  WDL and Logical  Suicidal Thoughts:  No  Homicidal Thoughts:  No  Memory:  Immediate;   Good Recent;   Good Remote;   Good  Judgement:  Fair  Insight:  Fair  Psychomotor Activity:  Normal  Concentration:  Concentration: Good and Attention Span: Good  Recall:  Good  Fund of Knowledge:  Fair  Language:  Good  Akathisia:  No  Handed:  Right  AIMS (if indicated):   N/A  Assets:  Leisure Time Physical Health Resilience Social Support  ADL's:  Intact  Cognition:  WNL  Sleep:   N/A     Treatment Plan Summary: Daily contact with patient to assess and evaluate symptoms and progress in treatment, Medication management and Plan adjustment disorder with disturbance of emotion:  -Crisis stabilization -Medication management:  Started Wellbutrin 300 mg daily for depression, Minipress 2 mg at bedtime for nightmares, and Remeron 15 mg at bedtime for sleep -Individual counseling  Disposition: No evidence of imminent risk to self or others at present.    Waylan Boga, NP   Patient's chart reviewed and case discussed with the physician extender and developed treatment plan. Reviewed the information documented and agree with the treatment plan.  Buford Dresser, DO 05/01/18 5:42 PM    05/01/2018 2:48 PM

## 2018-05-01 NOTE — ED Triage Notes (Addendum)
Patient here from home stating that he needs a medication refill. Wellbutrin, Remeron, and Minipress refill.

## 2018-05-01 NOTE — [facility] Suicide Risk Assessment (Addendum)
Suicide Risk Assessment  Discharge Assessment   [facility] Discharge Suicide Risk Assessment   Principal Problem: Cocaine abuse with cocaine-induced mood disorder [facility]) Discharge Diagnoses:  Patient Active Problem List   Diagnosis Date Noted  . Cocaine abuse with cocaine-induced mood disorder (HCC) [F14.14] 04/01/2017    Priority: High  . Alcohol use disorder, moderate, dependence (HCC) [F10.20] 04/10/2012    Priority: Low  . Alcohol abuse with alcohol-induced mood disorder (HCC) [F10.14] 03/29/2018  . Cannabis use disorder, mild, abuse [F12.10] 02/12/2018  . Bipolar I disorder (HCC) [F31.9] 02/11/2018  . MDD (major depressive disorder) [F32.9] 12/11/2017  . Intentional overdose of drug in tablet form (HCC) [T50.902A] 11/08/2017  . Dyslipidemia [E78.5] 11/08/2017  . Constipation [K59.00] 11/08/2017  . Drug overdose [T50.901A] 11/05/2017  . Acute alcoholic intoxication without complication (HCC) [F10.920]   . Suicidal ideation [R45.851]   . Alcohol intoxication (HCC) [F10.929] 10/22/2017  . Alcohol withdrawal (HCC) [F10.239] 10/22/2017  . Polysubstance abuse (HCC) [F19.10]   . 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 HTN (hypertension) [I10] 06/20/2016  . Tobacco use disorder [F17.200] 06/20/2016  . Trauma [T14.90XA] 07/27/2015  . Cannabis use disorder, moderate, dependence (HCC) [F12.20]   . PTSD (post-traumatic stress disorder) [F43.10]   . Substance induced mood disorder (HCC) [F19.94] 07/07/2014  . Suicidal thoughts [R45.851] 04/04/2012    Total Time spent with patient: 45 minutes  Musculoskeletal: Strength & Muscle Tone: within normal limits Gait & Station: normal Patient leans: N/A  Psychiatric Specialty Exam: Physical Exam  Constitutional: He is oriented to person, place, and time. He appears well-developed and well-nourished.  HENT:  Head: Normocephalic.  Neck: Normal range of motion.  Respiratory: Effort normal.   Musculoskeletal: Normal range of motion.  Neurological: He is alert and oriented to person, place, and time.  Psychiatric: His speech is normal and behavior is normal. Judgment and thought content normal. His mood appears anxious. Cognition and memory are normal.    Review of Systems  Psychiatric/Behavioral: The patient is nervous/anxious.   All other systems reviewed and are negative.   Blood pressure (!) 144/95, pulse 71, temperature 97.6 F (36.4 C), temperature source Oral, resp. rate 18, SpO2 100 %.There is no height or weight on file to calculate BMI.  General Appearance: Casual  Eye Contact:  Good  Speech:  Normal Rate  Volume:  Normal  Mood:  Anxious, mild  Affect:  Congruent  Thought Process:  Coherent and Descriptions of Associations: Intact  Orientation:  Full (Time, Place, and Person)  Thought Content:  WDL and Logical  Suicidal Thoughts:  No  Homicidal Thoughts:  No  Memory:  Immediate;   Good Recent;   Good Remote;   Good  Judgement:  Fair  Insight:  Fair  Psychomotor Activity:  Normal  Concentration:  Concentration: Good and Attention Span: Good  Recall:  Good  Fund of Knowledge:  Fair  Language:  Good  Akathisia:  No  Handed:  Right  AIMS (if indicated):     Assets:  Leisure Time Physical Health Resilience Social Support  ADL's:  Intact  Cognition:  WNL  Sleep:      Mental Status Per Nursing Assessment::   On Admission:   medication refills  Demographic Factors:  Male and Caucasian  Loss Factors: NA  Historical Factors: NA  Risk Reduction Factors:   Sense of responsibility to family and Positive therapeutic relationship  Continued Clinical Symptoms:  Anxiety, mild  Cognitive Features That Contribute To Risk:  None    Suicide Risk:  Minimal: No identifiable suicidal ideation.  Patients presenting with no risk factors but with morbid ruminations; may be classified as minimal risk based on the severity of the depressive  symptoms    Plan Of Care/Follow-up recommendations:  Activity:  as tolerated Diet:  heart healthy diet  LORD, JAMISON, NP 05/01/2018, 3:01 PM

## 2018-05-01 NOTE — ED Notes (Signed)
Patient brought to room 42.  Belongings placed in locker 42.  Patient calm, cooperative.  Denies SI, HI, or AVH.  Patient eating a lunch tray of food and watching TV.  [facility] evaluating patient.

## 2018-05-01 NOTE — ED Provider Notes (Signed)
[facility]PT Provider Note   CSN: [id number] Arrival date & time: 05/01/18  1121     History   Chief Complaint Chief Complaint  Patient presents with  . Medication Refill  . Medical Clearance    HPI [facility] is a 52 y.o. male with a history of alcohol use disorder, cannabis use disorder, tobacco use disorder, cocaine abuse with cocaine induced mood disorder, polysubstance abuse, intentional drug overdose, bipolar 1 disorder, MDD, HTN, PTSD, and dyslipidemia who presents to the emergency department with a chief complaint of suicidal ideation and homicidal ideation.  The patient is currently completing a drug and alcohol program through the [place] [facility].  Spoke with Nira Retort, who was employed with [facility] [employer], who states that the patient was sent for psychiatric clearance as he has been endorsing suicidal and homicidal ideations over the last 3 days. Last SI was this morning.   The patient states "I have been making comments over the last few days that I want to end it all because I don't want to deal with everything anymore."  He denies active SI, HI, or auditory visual hallucinations.  He states that he feels very anxious, which he attributes to being because he lost his bottles of Wellbutrin, Remeron, and Minipress.  He last took the medications 4 to 5 days ago.  He was able to get the medications refilled, but states that he lost the refilled prescriptions 2 days ago.  He reports that he was supposed to have attended a 4-hour meeting this morning, but states that he was so anxious that he did not feel that he could sit through the meeting.  He also endorses decreased sleep over the last few days.  Last alcohol use was around 3 days ago and before that 10 days ago.  He states that he had a couple of beers.  He also endorses marijuana use.  He denies any other recreational or illicit drug use.  He has no other complaints at this time  including confusion, vomiting, dizziness, weakness, headache, chest pain, dyspnea, or fever chills.  The history is provided by the patient. No language interpreter was used.    Past Medical History:  Diagnosis Date  . Bipolar 1 disorder (HCC)   . Depression   . Hypertension   . PTSD (post-traumatic stress disorder)     Patient Active Problem List   Diagnosis Date Noted  . Alcohol abuse with alcohol-induced mood disorder (HCC) 03/29/2018  . Cannabis use disorder, mild, abuse 02/12/2018  . Bipolar I disorder (HCC) 02/11/2018  . MDD (major depressive disorder) 12/11/2017  . Intentional overdose of drug in tablet form (HCC) 11/08/2017  . Dyslipidemia 11/08/2017  . Constipation 11/08/2017  . Drug overdose 11/05/2017  . Acute alcoholic intoxication without complication (HCC)   . Suicidal ideation   . Alcohol intoxication (HCC) 10/22/2017  . Alcohol withdrawal (HCC) 10/22/2017  . Polysubstance abuse (HCC)   . Major depressive disorder, recurrent, severe with psychotic features (HCC) 05/15/2017  . Cocaine abuse with cocaine-induced mood disorder (HCC) 04/01/2017  . HTN (hypertension) 06/20/2016  . Tobacco use disorder 06/20/2016  . Trauma 07/27/2015  . Cannabis use disorder, moderate, dependence (HCC)   . PTSD (post-traumatic stress disorder)   . Substance induced mood disorder (HCC) 07/07/2014  . Alcohol use disorder, moderate, dependence (HCC) 04/10/2012  . Suicidal thoughts 04/04/2012    History reviewed. No pertinent surgical history.      Home Medications    Prior  to Admission medications   Medication Sig Start Date End Date Taking? Authorizing Provider  amLODipine (NORVASC) 5 MG tablet Take 1 tablet (5 mg total) by mouth daily. For high blood pressure 02/20/18  Yes Nwoko, Agnes I, NP  ARIPiprazole (ABILIFY) 15 MG tablet Take 1 tablet (15 mg total) by mouth daily. For mood control 02/20/18  Yes Armandina Stammer I, NP  gabapentin (NEURONTIN) 300 MG capsule Take 1 capsule  (300 mg total) by mouth 3 (three) times daily. 03/29/18  Yes Charm Rings, NP  hydrOXYzine (ATARAX/VISTARIL) 50 MG tablet Take 1 tablet (50 mg) by mouth four times daily as needed: For anxiety/sleep 02/19/18  Yes Armandina Stammer I, NP  lisinopril (PRINIVIL,ZESTRIL) 10 MG tablet Take 1 tablet (10 mg total) by mouth daily. For high blood pressure 02/20/18  Yes Nwoko, Agnes I, NP  buPROPion (WELLBUTRIN XL) 300 MG 24 hr tablet Take 1 tablet (300 mg total) by mouth daily. 05/01/18   Charm Rings, NP  mirtazapine (REMERON) 15 MG tablet Take 1 tablet (15 mg total) by mouth at bedtime. For depression/sleep 05/01/18   Charm Rings, NP  prazosin (MINIPRESS) 2 MG capsule Take 1 capsule (2 mg total) by mouth at bedtime. For PTSD nightmares 05/01/18   Charm Rings, NP    Family History Family History  Problem Relation Age of Onset  . Heart attack Other     Social History Social History   Tobacco Use  . Smoking status: Current Every Day Smoker    Packs/day: 1.00    Years: 30.00    Pack years: 30.00    Types: Cigarettes  . Smokeless tobacco: Never Used  Substance Use Topics  . Alcohol use: Yes    Alcohol/week: 1.2 oz    Types: 2 Cans of beer per week    Comment: Pt stated "I drink 3 40's a day."  . Drug use: Yes    Types: Cocaine, Marijuana    Comment: several times a day- pt reports hasn't used since detoxic      Allergies   Patient has no known allergies.   Review of Systems Review of Systems  Constitutional: Negative for appetite change and fever.  Respiratory: Negative for shortness of breath.   Cardiovascular: Negative for chest pain.  Gastrointestinal: Negative for abdominal pain, diarrhea, nausea and vomiting.  Genitourinary: Negative for dysuria.  Musculoskeletal: Negative for back pain, neck pain and neck stiffness.  Skin: Negative for rash.  Allergic/Immunologic: Negative for immunocompromised state.  Neurological: Negative for headaches.  Psychiatric/Behavioral: Positive  for dysphoric mood, sleep disturbance and suicidal ideas. Negative for confusion and hallucinations. The patient is nervous/anxious.      Physical Exam Updated Vital Signs BP (!) 144/95 (BP Location: Right Arm)   Pulse 71   Temp 97.6 F (36.4 C) (Oral)   Resp 18   SpO2 100%   Physical Exam  Constitutional: He appears well-developed.  HENT:  Head: Normocephalic.  Eyes: Conjunctivae are normal.  Neck: Neck supple.  Cardiovascular: Normal rate and regular rhythm.  No murmur heard. Pulmonary/Chest: Effort normal. No stridor. No respiratory distress. He has no wheezes. He has no rales. He exhibits no tenderness.  Abdominal: Soft. He exhibits no distension.  Neurological: He is alert.  Skin: Skin is warm and dry.  Psychiatric: His mood appears anxious. His affect is labile. His speech is rapid and/or pressured. He is not actively hallucinating. Thought content is not paranoid and not delusional. Cognition and memory are normal. He expresses impulsivity.  He expresses no suicidal plans and no homicidal plans.  Anxious appearing.  He has paced in another room and appeared very agitated while I was examining other patients.  He was noted to be talking to himself and stating things such as "Hurry up" and "This is ridiculous" and "What's taking so long." During my exam, the patient was polite and able to concentrate and relax.   Nursing note and vitals reviewed.    ED Treatments / Results  Labs (all labs ordered are listed, but only abnormal results are displayed) Labs Reviewed  COMPREHENSIVE METABOLIC PANEL - Abnormal; Notable for the following components:      Result Value   Creatinine, Ser 1.26 (*)    ALT 16 (*)    All other components within normal limits  RAPID URINE DRUG SCREEN, HOSP PERFORMED - Abnormal; Notable for the following components:   Cocaine POSITIVE (*)    Benzodiazepines POSITIVE (*)    Tetrahydrocannabinol POSITIVE (*)    All other components within normal limits    ACETAMINOPHEN LEVEL - Abnormal; Notable for the following components:   Acetaminophen (Tylenol), Serum <10 (*)    All other components within normal limits  URINALYSIS, ROUTINE W REFLEX MICROSCOPIC - Abnormal; Notable for the following components:   Hgb urine dipstick MODERATE (*)    Leukocytes, UA TRACE (*)    All other components within normal limits  ETHANOL  SALICYLATE LEVEL  CBC WITH DIFFERENTIAL/PLATELET  CBC WITH DIFFERENTIAL/PLATELET    EKG None  Radiology No results found.  Procedures Procedures (including critical care time)  Medications Ordered in ED Medications  buPROPion (WELLBUTRIN XL) 24 hr tablet 300 mg (300 mg Oral Given 05/01/18 1504)  mirtazapine (REMERON) tablet 15 mg (has no administration in time range)  prazosin (MINIPRESS) capsule 2 mg (has no administration in time range)     Initial Impression / Assessment and Plan / ED Course  I have reviewed the triage vital signs and the nursing notes.  Pertinent labs & imaging results that were available during my care of the patient were reviewed by me and considered in my medical decision making (see chart for details).     52 year old male with a history of alcohol use disorder, cannabis use disorder, tobacco use disorder, cocaine abuse with cocaine induced mood disorder, polysubstance abuse, intentional drug overdose, bipolar 1 disorder, MDD, HTN, PTSD, and dyslipidemia.  He was advised to come to the emergency department for psychiatric clearance by [facility], where he is enrolled in a program for drug and alcohol use. Spoke with Nira Retort from [facility] who states the patient has been having SI and HI over the last 2 days.  He denies active SI and HI at this time, but reports that he has had 3 current thoughts of suicidal ideation over the past 2 days.  He is also requesting medications of his Wellbutrin, Remeron, and prazosin after he lost his newly refilled prescriptions 2 days ago.  He has  not taken any of these medications in the last 4 to 5 days.  Labs reviewed.  At this time, the patient is medically cleared. Psych hold orders placed. TTS consult pending; please see psych team notes for further documentation of care/dispo. Pt stable at time of med clearance.    Final Clinical Impressions(s) / ED Diagnoses   Final diagnoses:  Cocaine abuse with cocaine-induced mood disorder [facility])    ED Discharge Orders        Ordered  prazosin (MINIPRESS) 2 MG capsule  Daily at bedtime     05/01/18 1501    buPROPion (WELLBUTRIN XL) 300 MG 24 hr tablet  Daily     05/01/18 1501    mirtazapine (REMERON) 15 MG tablet  Daily at bedtime     05/01/18 1501    Increase activity slowly     05/01/18 1501    Diet - low sodium heart healthy     05/01/18 1501    Discharge instructions    Comments:  Discharge home   05/01/18 1501       Barkley Boards, PA-C 05/01/18 1636    Terrilee Files, MD 05/01/18 [id number]

## 2018-05-01 NOTE — ED Notes (Signed)
PT HAS 2 BELONGING BAGS. SECURED IN LOCKER 42. Shoes in one bag. Clothing with lighter and cigarettes in second bag. Wanded by security prior to being escorted to room 42. Pt is alert , oriented and cooperative.

## 2018-07-06 ENCOUNTER — Emergency Department (HOSPITAL_COMMUNITY)
Admission: EM | Admit: 2018-07-06 | Discharge: 2018-07-07 | Disposition: A | Discharge status: Other Acute Inpatient Hospital | Payer: Medicaid Other | Attending: Emergency Medicine | Admitting: Emergency Medicine

## 2018-07-06 ENCOUNTER — Encounter (HOSPITAL_COMMUNITY): Payer: Self-pay | Admitting: Emergency Medicine

## 2018-07-06 ENCOUNTER — Other Ambulatory Visit: Payer: Self-pay

## 2018-07-06 DIAGNOSIS — F17210 Nicotine dependence, cigarettes, uncomplicated: Secondary | ICD-10-CM | POA: Insufficient documentation

## 2018-07-06 DIAGNOSIS — F1910 Other psychoactive substance abuse, uncomplicated: Secondary | ICD-10-CM | POA: Insufficient documentation

## 2018-07-06 DIAGNOSIS — F314 Bipolar disorder, current episode depressed, severe, without psychotic features: Secondary | ICD-10-CM | POA: Insufficient documentation

## 2018-07-06 DIAGNOSIS — I10 Essential (primary) hypertension: Secondary | ICD-10-CM | POA: Insufficient documentation

## 2018-07-06 DIAGNOSIS — R45851 Suicidal ideations: Secondary | ICD-10-CM | POA: Insufficient documentation

## 2018-07-06 DIAGNOSIS — F4310 Post-traumatic stress disorder, unspecified: Secondary | ICD-10-CM | POA: Diagnosis present

## 2018-07-06 DIAGNOSIS — R45850 Homicidal ideations: Secondary | ICD-10-CM

## 2018-07-06 LAB — COMPREHENSIVE METABOLIC PANEL
ALBUMIN: 3.9 g/dL (ref 3.5–5.0)
ALK PHOS: 80 U/L (ref 38–126)
ALT: 17 U/L (ref 0–44)
AST: 18 U/L (ref 15–41)
Anion gap: 11 (ref 5–15)
BILIRUBIN TOTAL: 0.8 mg/dL (ref 0.3–1.2)
BUN: 9 mg/dL (ref 6–20)
CO2: 23 mmol/L (ref 22–32)
Calcium: 8.9 mg/dL (ref 8.9–10.3)
Chloride: 105 mmol/L (ref 98–111)
Creatinine, Ser: 1.14 mg/dL (ref 0.61–1.24)
GFR calc Af Amer: >60 mL/min (ref >60)
GFR calc non Af Amer: >60 mL/min (ref >60)
GLUCOSE: 86 mg/dL (ref 70–99)
POTASSIUM: 4.1 mmol/L (ref 3.5–5.1)
Sodium: 139 mmol/L (ref 135–145)
TOTAL PROTEIN: 6.6 g/dL (ref 6.5–8.1)

## 2018-07-06 LAB — CBC
HEMATOCRIT: 49.2 % (ref 39.0–52.0)
HEMOGLOBIN: 16.4 g/dL (ref 13.0–17.0)
MCH: 29.3 pg (ref 26.0–34.0)
MCHC: 33.3 g/dL (ref 30.0–36.0)
MCV: 88 fL (ref 78.0–100.0)
Platelets: 204 10*3/uL (ref 150–400)
RBC: 5.59 MIL/uL (ref 4.22–5.81)
RDW: 13.7 % (ref 11.5–15.5)
WBC: 7.1 10*3/uL (ref 4.0–10.5)

## 2018-07-06 LAB — SALICYLATE LEVEL: Salicylate Lvl: <7 mg/dL (ref 2.8–30.0)

## 2018-07-06 LAB — RAPID URINE DRUG SCREEN, HOSP PERFORMED
Amphetamines: NOT DETECTED
BENZODIAZEPINES: NOT DETECTED
Cocaine: POSITIVE — AB
Opiates: NOT DETECTED
TETRAHYDROCANNABINOL: POSITIVE — AB

## 2018-07-06 LAB — ACETAMINOPHEN LEVEL: Acetaminophen (Tylenol), Serum: <10 ug/mL — ABNORMAL LOW (ref 10–30)

## 2018-07-06 LAB — ETHANOL: Alcohol, Ethyl (B): 19 mg/dL — ABNORMAL HIGH (ref <10)

## 2018-07-06 MED ORDER — GABAPENTIN 300 MG PO CAPS
300.0000 mg | ORAL_CAPSULE | Freq: Three times a day (TID) | ORAL | Status: DC
  Administered 2018-07-06 – 2018-07-07 (×4): 300 mg via ORAL
  Filled 2018-07-06 (×4): qty 1

## 2018-07-06 MED ORDER — ALUM & MAG HYDROXIDE-SIMETH 200-200-20 MG/5ML PO SUSP: 30.0000 mL | Freq: Four times a day (QID) | ORAL | Status: DC | PRN

## 2018-07-06 MED ORDER — NICOTINE 21 MG/24HR TD PT24
21.0000 mg | MEDICATED_PATCH | Freq: Every day | TRANSDERMAL | Status: DC
  Filled 2018-07-06: qty 1

## 2018-07-06 MED ORDER — ONDANSETRON HCL 4 MG PO TABS: 4.0000 mg | ORAL_TABLET | Freq: Three times a day (TID) | ORAL | Status: DC | PRN

## 2018-07-06 MED ORDER — ARIPIPRAZOLE 10 MG PO TABS
15.0000 mg | ORAL_TABLET | Freq: Every day | ORAL | Status: DC
  Administered 2018-07-06 – 2018-07-07 (×2): 15 mg via ORAL
  Filled 2018-07-06: qty 3
  Filled 2018-07-06: qty 2

## 2018-07-06 MED ORDER — ZOLPIDEM TARTRATE 5 MG PO TABS: 5.0000 mg | ORAL_TABLET | Freq: Every evening | ORAL | Status: DC | PRN

## 2018-07-06 MED ORDER — PRAZOSIN HCL 2 MG PO CAPS
2.0000 mg | ORAL_CAPSULE | Freq: Every day | ORAL | Status: DC
  Administered 2018-07-06: 2 mg via ORAL
  Filled 2018-07-06: qty 1

## 2018-07-06 MED ORDER — IBUPROFEN 400 MG PO TABS: 600.0000 mg | ORAL_TABLET | Freq: Three times a day (TID) | ORAL | Status: DC | PRN

## 2018-07-06 MED ORDER — CITALOPRAM HYDROBROMIDE 10 MG PO TABS
10.0000 mg | ORAL_TABLET | Freq: Every day | ORAL | Status: DC
  Administered 2018-07-06 – 2018-07-07 (×2): 10 mg via ORAL
  Filled 2018-07-06 (×2): qty 1

## 2018-07-06 MED ORDER — HYDROXYZINE HCL 50 MG PO TABS: 50.0000 mg | ORAL_TABLET | Freq: Three times a day (TID) | ORAL | Status: DC | PRN

## 2018-07-06 MED ORDER — AMLODIPINE BESYLATE 5 MG PO TABS
5.0000 mg | ORAL_TABLET | Freq: Every day | ORAL | Status: DC
  Administered 2018-07-06 – 2018-07-07 (×2): 5 mg via ORAL
  Filled 2018-07-06 (×2): qty 1

## 2018-07-06 MED ORDER — LISINOPRIL 10 MG PO TABS
10.0000 mg | ORAL_TABLET | Freq: Every day | ORAL | Status: DC
  Administered 2018-07-06 – 2018-07-07 (×2): 10 mg via ORAL
  Filled 2018-07-06 (×2): qty 1

## 2018-07-06 MED ORDER — MIRTAZAPINE 15 MG PO TABS
15.0000 mg | ORAL_TABLET | Freq: Every day | ORAL | Status: DC
  Administered 2018-07-06: 15 mg via ORAL
  Filled 2018-07-06: qty 1

## 2018-07-06 NOTE — ED Notes (Addendum)
Pt ambulatory to F8 w/Sitter. Pt noted to be wearing burgundy scrubs. Pt alert, oriented, calm, cooperative, and voices agreement to remain voluntary and continue w/tx plan - Inpt. Pt voices understanding and signed Medical Clearance Pt Policy form - copy given to pt. Pt's belongings - inventoried by Lanna PocheJ Allen, RN - 1 labeled belongings bag placed in [employer] [address] and Valuables Envelope noted w/Security. Pt aware.

## 2018-07-06 NOTE — ED Triage Notes (Addendum)
Pt reports he has been off of his psych meds x1.5 months, states he was in a program with [facility] that got shut down, currently in a difficult living situation. States he started feeling SI and HI last night when his roommate tried to kick him out. Reports he began drinking last night, had 1-40 oz beer. Reports cocaine use last night. Pt reports lost his sister last January and has struggled since. States he has a plan to shoot himself and has access to guns where he lives. Pt pleasant and cooperative in triage.

## 2018-07-06 NOTE — Progress Notes (Signed)
Patient meets criteria for inpatient treatment.CSW faxed referrals to the following facilities for review.  [facility] [address] [facility], [facility], [address] [address] [place], [place] [address], [address] [place], [facility], [place], and [place].   TTS will continue to seek bed placement.     Moss McKy-sha Zulay Corrie, MSW, LCSW, LCAS 07/06/2018 3:20 PM

## 2018-07-06 NOTE — ED Notes (Signed)
Staffing called regarding need for sitter, advised this RN that no sitters are available at this time and that they would put down for him to have a sitter but if patient got a room then we could use the sitter camera

## 2018-07-06 NOTE — ED Notes (Signed)
Patient has not had medications in over month. All meds given (some were early) for this reason.

## 2018-07-06 NOTE — ED Notes (Signed)
PA at bedside at this time.  

## 2018-07-06 NOTE — ED Provider Notes (Signed)
[facility] EMERGENCY DEPARTMENT Provider Note   CSN: [id number] Arrival date & time: 07/06/18  1005     History   Chief Complaint Chief Complaint  Patient presents with  . Psychiatric Evaluation    HPI Chase Moss is a 52 y.o. male Who presents with cc of SI/HI. The patient states that he was receiving psychiatric medications who [facility] has been out of his medications for about a month and a half.  He got into an argument with his roommate last night after the roommate tried to kick him out.  He began drinking alcohol and using cocaine.  He states that he was angry and feels both homicidal and suicidal with a plan to shoot himself with a gun he has access to.  Patient has previous psychiatric hospitalizations.  He denies auditory or visual hallucinations.  He denies alcohol dependence.  HPI  Past Medical History:  Diagnosis Date  . Bipolar 1 disorder (HCC)   . Depression   . Hypertension   . PTSD (post-traumatic stress disorder)     Patient Active Problem List   Diagnosis Date Noted  . Alcohol abuse with alcohol-induced mood disorder (HCC) 03/29/2018  . Cannabis use disorder, mild, abuse 02/12/2018  . Bipolar I disorder (HCC) 02/11/2018  . MDD (major depressive disorder) 12/11/2017  . Intentional overdose of drug in tablet form (HCC) 11/08/2017  . Dyslipidemia 11/08/2017  . Constipation 11/08/2017  . Drug overdose 11/05/2017  . Acute alcoholic intoxication without complication (HCC)   . Suicidal ideation   . Alcohol intoxication (HCC) 10/22/2017  . Alcohol withdrawal (HCC) 10/22/2017  . Polysubstance abuse (HCC)   . Major depressive disorder, recurrent, severe with psychotic features (HCC) 05/15/2017  . Cocaine abuse with cocaine-induced mood disorder (HCC) 04/01/2017  . HTN (hypertension) 06/20/2016  . Tobacco use disorder 06/20/2016  . Trauma 07/27/2015  . Cannabis use disorder, moderate, dependence  (HCC)   . PTSD (post-traumatic stress disorder)   . Substance induced mood disorder (HCC) 07/07/2014  . Alcohol use disorder, moderate, dependence (HCC) 04/10/2012  . Suicidal thoughts 04/04/2012    History reviewed. No pertinent surgical history.      Home Medications    Prior to Admission medications   Medication Sig Start Date End Date Taking? Authorizing Provider  amLODipine (NORVASC) 5 MG tablet Take 1 tablet (5 mg total) by mouth daily. For high blood pressure Patient not taking: Reported on 07/06/2018 02/20/18   Armandina Stammer I, NP  ARIPiprazole (ABILIFY) 15 MG tablet Take 1 tablet (15 mg total) by mouth daily. For mood control Patient not taking: Reported on 07/06/2018 02/20/18   Armandina Stammer I, NP  buPROPion (WELLBUTRIN XL) 300 MG 24 hr tablet Take 1 tablet (300 mg total) by mouth daily. Patient not taking: Reported on 07/06/2018 05/01/18   Charm Rings, NP  gabapentin (NEURONTIN) 300 MG capsule Take 1 capsule (300 mg total) by mouth 3 (three) times daily. Patient not taking: Reported on 07/06/2018 03/29/18   Charm Rings, NP  hydrOXYzine (ATARAX/VISTARIL) 50 MG tablet Take 1 tablet (50 mg) by mouth four times daily as needed: For anxiety/sleep Patient not taking: Reported on 07/06/2018 02/19/18   Armandina Stammer I, NP  lisinopril (PRINIVIL,ZESTRIL) 10 MG tablet Take 1 tablet (10 mg total) by mouth daily. For high blood pressure Patient not taking: Reported on 07/06/2018 02/20/18   Armandina Stammer I, NP  mirtazapine (REMERON) 15 MG tablet Take 1 tablet (15 mg  total) by mouth at bedtime. For depression/sleep Patient not taking: Reported on 07/06/2018 05/01/18   Charm Rings, NP  prazosin (MINIPRESS) 2 MG capsule Take 1 capsule (2 mg total) by mouth at bedtime. For PTSD nightmares Patient not taking: Reported on 07/06/2018 05/01/18   Charm Rings, NP    Family History Family History  Problem Relation Age of Onset  . Heart attack Other     Social History Social History   Tobacco Use   . Smoking status: Current Every Day Smoker    Packs/day: 1.00    Years: 30.00    Pack years: 30.00    Types: Cigarettes  . Smokeless tobacco: Never Used  Substance Use Topics  . Alcohol use: Yes    Alcohol/week: 1.2 oz    Types: 2 Cans of beer per week    Comment: Pt stated "I drink 3 40's a day."  . Drug use: Yes    Types: Cocaine, Marijuana    Comment: several times a day- pt reports hasn't used since detoxic      Allergies   Patient has no known allergies.   Review of Systems Review of Systems   Physical Exam Updated Vital Signs BP (!) 146/105 (BP Location: Right Arm)   Pulse 88   Temp 98.1 F (36.7 C) (Oral)   Resp 20   Ht 5\' 10"  (1.778 m)   Wt 79.4 kg (175 lb)   SpO2 98%   BMI 25.11 kg/m   Physical Exam   ED Treatments / Results  Labs (all labs ordered are listed, but only abnormal results are displayed) Labs Reviewed  ETHANOL - Abnormal; Notable for the following components:      Result Value   Alcohol, Ethyl (B) 19 (*)    All other components within normal limits  ACETAMINOPHEN LEVEL - Abnormal; Notable for the following components:   Acetaminophen (Tylenol), Serum <10 (*)    All other components within normal limits  RAPID URINE DRUG SCREEN, HOSP PERFORMED - Abnormal; Notable for the following components:   Cocaine POSITIVE (*)    Tetrahydrocannabinol POSITIVE (*)    Barbiturates   (*)    Value: Result not available. Reagent lot number recalled by manufacturer.   All other components within normal limits  COMPREHENSIVE METABOLIC PANEL  SALICYLATE LEVEL  CBC    EKG None  Radiology No results found.  Procedures Procedures (including critical care time)  Medications Ordered in ED Medications  ibuprofen (ADVIL,MOTRIN) tablet 600 mg (has no administration in time range)  zolpidem (AMBIEN) tablet 5 mg (has no administration in time range)  ondansetron (ZOFRAN) tablet 4 mg (has no administration in time range)  alum & mag  hydroxide-simeth (MAALOX/MYLANTA) 200-200-20 MG/5ML suspension 30 mL (has no administration in time range)  nicotine (NICODERM CQ - dosed in mg/24 hours) patch 21 mg (has no administration in time range)  amLODipine (NORVASC) tablet 5 mg (has no administration in time range)  ARIPiprazole (ABILIFY) tablet 15 mg (has no administration in time range)  hydrOXYzine (ATARAX/VISTARIL) tablet 50 mg (has no administration in time range)  lisinopril (PRINIVIL,ZESTRIL) tablet 10 mg (has no administration in time range)  gabapentin (NEURONTIN) capsule 300 mg (has no administration in time range)  prazosin (MINIPRESS) capsule 2 mg (has no administration in time range)  mirtazapine (REMERON) tablet 15 mg (has no administration in time range)  citalopram (CELEXA) tablet 10 mg (has no administration in time range)     Initial Impression / Assessment and Plan /  ED Course  I have reviewed the triage vital signs and the nursing notes.  Pertinent labs & imaging results that were available during my care of the patient were reviewed by me and considered in my medical decision making (see chart for details).     Patient is here voluntarily.  He appears medically clear for psych eval.  His UDS is pending.  Final Clinical Impressions(s) / ED Diagnoses   Final diagnoses:  Suicidal ideation  Homicidal ideation  Polysubstance abuse [facility](HCC)    ED Discharge Orders    None       Arthor CaptainHarris, Alysiah Suppa, PA-C 07/06/18 1549    Linwood DibblesKnapp, Jon, MD 07/06/18 [phone]
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
## 2018-07-06 NOTE — ED Notes (Signed)
Pt currently in Room#5, Pod F, awaiting TTS Call

## 2018-07-06 NOTE — ED Notes (Signed)
Sitter at bedside.

## 2018-07-06 NOTE — ED Notes (Signed)
Pt given wine colored scrubs and navy blue socks to change in.

## 2018-07-07 ENCOUNTER — Encounter (HOSPITAL_COMMUNITY): Payer: Self-pay | Admitting: *Deleted

## 2018-07-07 ENCOUNTER — Other Ambulatory Visit: Payer: Self-pay

## 2018-07-07 ENCOUNTER — Inpatient Hospital Stay (HOSPITAL_COMMUNITY)
Admission: AD | Admit: 2018-07-07 | Discharge: 2018-07-17 | DRG: 885 | Disposition: A | Discharge status: Home / Self Care | Payer: Medicaid Other | Source: Intra-hospital | Attending: Psychiatry | Admitting: Psychiatry

## 2018-07-07 DIAGNOSIS — F3132 Bipolar disorder, current episode depressed, moderate: Secondary | ICD-10-CM | POA: Diagnosis present

## 2018-07-07 DIAGNOSIS — Z590 Homelessness: Secondary | ICD-10-CM | POA: Diagnosis not present

## 2018-07-07 DIAGNOSIS — K219 Gastro-esophageal reflux disease without esophagitis: Secondary | ICD-10-CM | POA: Diagnosis present

## 2018-07-07 DIAGNOSIS — R450 Nervousness: Secondary | ICD-10-CM | POA: Diagnosis not present

## 2018-07-07 DIAGNOSIS — I10 Essential (primary) hypertension: Secondary | ICD-10-CM | POA: Diagnosis present

## 2018-07-07 DIAGNOSIS — F1020 Alcohol dependence, uncomplicated: Secondary | ICD-10-CM | POA: Diagnosis present

## 2018-07-07 DIAGNOSIS — F1414 Cocaine abuse with cocaine-induced mood disorder: Secondary | ICD-10-CM | POA: Diagnosis present

## 2018-07-07 DIAGNOSIS — F319 Bipolar disorder, unspecified: Principal | ICD-10-CM | POA: Diagnosis present

## 2018-07-07 DIAGNOSIS — E785 Hyperlipidemia, unspecified: Secondary | ICD-10-CM | POA: Diagnosis present

## 2018-07-07 DIAGNOSIS — F419 Anxiety disorder, unspecified: Secondary | ICD-10-CM | POA: Diagnosis present

## 2018-07-07 DIAGNOSIS — F1024 Alcohol dependence with alcohol-induced mood disorder: Secondary | ICD-10-CM | POA: Diagnosis present

## 2018-07-07 DIAGNOSIS — R45851 Suicidal ideations: Secondary | ICD-10-CM | POA: Diagnosis present

## 2018-07-07 DIAGNOSIS — F4310 Post-traumatic stress disorder, unspecified: Secondary | ICD-10-CM | POA: Diagnosis present

## 2018-07-07 DIAGNOSIS — R45850 Homicidal ideations: Secondary | ICD-10-CM | POA: Insufficient documentation

## 2018-07-07 DIAGNOSIS — F1210 Cannabis abuse, uncomplicated: Secondary | ICD-10-CM | POA: Diagnosis not present

## 2018-07-07 DIAGNOSIS — F17210 Nicotine dependence, cigarettes, uncomplicated: Secondary | ICD-10-CM | POA: Diagnosis present

## 2018-07-07 DIAGNOSIS — Z91410 Personal history of adult physical and sexual abuse: Secondary | ICD-10-CM | POA: Diagnosis not present

## 2018-07-07 DIAGNOSIS — G4700 Insomnia, unspecified: Secondary | ICD-10-CM | POA: Diagnosis present

## 2018-07-07 DIAGNOSIS — F1220 Cannabis dependence, uncomplicated: Secondary | ICD-10-CM | POA: Diagnosis present

## 2018-07-07 DIAGNOSIS — Z818 Family history of other mental and behavioral disorders: Secondary | ICD-10-CM | POA: Diagnosis not present

## 2018-07-07 DIAGNOSIS — Z915 Personal history of self-harm: Secondary | ICD-10-CM | POA: Diagnosis not present

## 2018-07-07 DIAGNOSIS — Z634 Disappearance and death of family member: Secondary | ICD-10-CM

## 2018-07-07 DIAGNOSIS — F17200 Nicotine dependence, unspecified, uncomplicated: Secondary | ICD-10-CM | POA: Diagnosis present

## 2018-07-07 DIAGNOSIS — F1010 Alcohol abuse, uncomplicated: Secondary | ICD-10-CM | POA: Diagnosis not present

## 2018-07-07 MED ORDER — ZOLPIDEM TARTRATE 5 MG PO TABS
5.0000 mg | ORAL_TABLET | Freq: Every evening | ORAL | Status: DC | PRN
  Administered 2018-07-08: 5 mg via ORAL
  Filled 2018-07-07: qty 1

## 2018-07-07 MED ORDER — MIRTAZAPINE 15 MG PO TABS
15.0000 mg | ORAL_TABLET | Freq: Every day | ORAL | Status: DC
  Administered 2018-07-07 – 2018-07-13 (×7): 15 mg via ORAL
  Filled 2018-07-07 (×10): qty 1

## 2018-07-07 MED ORDER — AMLODIPINE BESYLATE 5 MG PO TABS
5.0000 mg | ORAL_TABLET | Freq: Every day | ORAL | Status: DC
  Administered 2018-07-08 – 2018-07-13 (×6): 5 mg via ORAL
  Filled 2018-07-07 (×8): qty 1

## 2018-07-07 MED ORDER — GABAPENTIN 300 MG PO CAPS
300.0000 mg | ORAL_CAPSULE | Freq: Three times a day (TID) | ORAL | Status: DC
  Administered 2018-07-08 – 2018-07-09 (×3): 300 mg via ORAL
  Filled 2018-07-07 (×7): qty 1

## 2018-07-07 MED ORDER — CITALOPRAM HYDROBROMIDE 10 MG PO TABS
10.0000 mg | ORAL_TABLET | Freq: Every day | ORAL | Status: DC
  Administered 2018-07-08 – 2018-07-09 (×2): 10 mg via ORAL
  Filled 2018-07-07 (×3): qty 1

## 2018-07-07 MED ORDER — LISINOPRIL 10 MG PO TABS
10.0000 mg | ORAL_TABLET | Freq: Every day | ORAL | Status: DC
  Administered 2018-07-08 – 2018-07-17 (×10): 10 mg via ORAL
  Filled 2018-07-07 (×12): qty 1

## 2018-07-07 MED ORDER — NICOTINE POLACRILEX 2 MG MT GUM: 2.0000 mg | CHEWING_GUM | OROMUCOSAL | Status: DC | PRN

## 2018-07-07 MED ORDER — ARIPIPRAZOLE 15 MG PO TABS
15.0000 mg | ORAL_TABLET | Freq: Every day | ORAL | Status: DC
  Administered 2018-07-08 – 2018-07-10 (×3): 15 mg via ORAL
  Filled 2018-07-07 (×4): qty 1

## 2018-07-07 MED ORDER — NICOTINE 21 MG/24HR TD PT24
21.0000 mg | MEDICATED_PATCH | Freq: Every day | TRANSDERMAL | Status: DC
  Administered 2018-07-09: 21 mg via TRANSDERMAL
  Filled 2018-07-07 (×13): qty 1

## 2018-07-07 MED ORDER — ONDANSETRON HCL 4 MG PO TABS
4.0000 mg | ORAL_TABLET | Freq: Three times a day (TID) | ORAL | Status: DC | PRN
  Administered 2018-07-08 (×2): 4 mg via ORAL
  Filled 2018-07-07 (×3): qty 1

## 2018-07-07 MED ORDER — HYDROXYZINE HCL 50 MG PO TABS
50.0000 mg | ORAL_TABLET | Freq: Three times a day (TID) | ORAL | Status: DC | PRN
Start: 2018-07-07 — End: 2018-07-17
  Administered 2018-07-07 – 2018-07-16 (×8): 50 mg via ORAL
  Filled 2018-07-07 (×8): qty 1

## 2018-07-07 MED ORDER — ALUM & MAG HYDROXIDE-SIMETH 200-200-20 MG/5ML PO SUSP: 30.0000 mL | Freq: Four times a day (QID) | ORAL | Status: DC | PRN

## 2018-07-07 MED ORDER — MAGNESIUM HYDROXIDE 400 MG/5ML PO SUSP
30.0000 mL | Freq: Every day | ORAL | Status: DC | PRN
  Administered 2018-07-12: 30 mL via ORAL
  Filled 2018-07-07: qty 30

## 2018-07-07 MED ORDER — PRAZOSIN HCL 2 MG PO CAPS
2.0000 mg | ORAL_CAPSULE | Freq: Every day | ORAL | Status: DC
  Administered 2018-07-07 – 2018-07-11 (×5): 2 mg via ORAL
  Filled 2018-07-07: qty 1
  Filled 2018-07-07: qty 2
  Filled 2018-07-07 (×5): qty 1

## 2018-07-07 MED ORDER — IBUPROFEN 600 MG PO TABS
600.0000 mg | ORAL_TABLET | Freq: Three times a day (TID) | ORAL | Status: DC | PRN
  Administered 2018-07-07: 600 mg via ORAL
  Filled 2018-07-07: qty 1

## 2018-07-07 MED ORDER — ENSURE ENLIVE PO LIQD
237.0000 mL | Freq: Two times a day (BID) | ORAL | Status: DC
  Administered 2018-07-09 – 2018-07-17 (×14): 237 mL via ORAL

## 2018-07-07 NOTE — Progress Notes (Signed)
[facility] Bed [facility]1 Chase Calkinsravis Money, NP is the accepting provider.  Dr. Jama Flavorsobos is the attending provider.  Call report to [phone]  [email or web address] notified.   Pt is Voluntary.  Pt may be transported by [facility]  [email or web address]  Chase BernJean T. Chase LimSutter, MSW, LCSWA Disposition Clinical Social Work [phone] (cell) [phone] (office)

## 2018-07-07 NOTE — ED Notes (Signed)
[facility] called again person on phone stated last person forgot to put it in for pt to be picked up he said he would send someone now.
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
## 2018-07-07 NOTE — ED Notes (Signed)
Pt dinner tray ordered.

## 2018-07-07 NOTE — ED Notes (Signed)
[facility] called for pt.
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
## 2018-07-07 NOTE — ED Notes (Signed)
Lunch tray ordered 

## 2018-07-07 NOTE — Tx Team (Signed)
Initial Treatment Plan 07/07/2018 10:18 PM Chase Moss [id number]    PATIENT STRESSORS: Financial difficulties Loss of 2 family members within the past year and a half Medication change or noncompliance Traumatic event   PATIENT STRENGTHS: Average or above average intelligence Communication skills General fund of knowledge   PATIENT IDENTIFIED PROBLEMS: SI  HI  depression    "work on my living situation"   "stop feeling like I'm hopeless"   "stop feeling like I want to hurt him"          DISCHARGE CRITERIA:  Improved stabilization in mood, thinking, and/or behavior Motivation to continue treatment in a less acute level of care  PRELIMINARY DISCHARGE PLAN: Placement in alternative living arrangements  PATIENT/FAMILY INVOLVEMENT: This treatment plan has been presented to and reviewed with the patient, Chase Moss.  The patient and family have been given the opportunity to ask questions and make suggestions.  Arrie AranChurch, Bergen Melle J, [place]RN 07/07/2018, 10:18 PM

## 2018-07-07 NOTE — ED Notes (Signed)
[facility]. Patient belongings from locker and security handed to [employer].

## 2018-07-08 NOTE — Progress Notes (Signed)
Patient ID: Chase DowGregory Salmi, male   DOB: 02/25/1966, 52 y.o.   MRN: [id number]  Patient complaining of nausea and reports "I've thrown up every time I get up for meds today". No emesis seen by staff. Patient also reports, "this Gabapentin does nothing for me. Can we discontinue it?" Patient agreeable to taking 1700 dose now.

## 2018-07-08 NOTE — Progress Notes (Signed)
Recreation Therapy Notes  Date: 7.16.19 Time: 1000 Location: 500 Hall Dayroom  Group Topic: Anger Management  Goal Area(s) Addresses:  Patient will identify triggers for anger.  Patient will identify physical reaction to anger.   Patient will identify benefit of using coping skills when angry.  Intervention: Worksheet  Activity: Introduction to Anger Management.  Patients were to identify 3 situations, topics or people that lead to feelings of anger; what they do when angry and the problems they have run into because of anger.  Education: Anger Management, Discharge Planning   Education Outcome: Acknowledges education/In group clarification offered/Needs additional education.   Clinical Observations/Feedback: Pt did not attend group.    Caroll RancherMarjette Sage Hammill, LRT/CTRS       Lillia AbedLindsay, Everard Interrante A 07/08/2018 11:59 AM

## 2018-07-08 NOTE — [facility] Group Notes (Signed)
LCSW Group Therapy Note   07/08/2018 1:15pm   Type of Therapy and Topic:  Group Therapy:  Positive Affirmations   Participation Level:  Did Not Attend  Description of Group: This group addressed positive affirmation toward self and others. Patients went around the room and identified two positive things about themselves and two positive things about a peer in the room. Patients reflected on how it felt to share something positive with others, to identify positive things about themselves, and to hear positive things from others. Patients were encouraged to have a daily reflection of positive characteristics or circumstances.  Therapeutic Goals 1. Patient will verbalize two of their positive qualities 2. Patient will demonstrate empathy for others by stating two positive qualities about a peer in the group 3. Patient will verbalize their feelings when voicing positive self affirmations and when voicing positive affirmations of others 4. Patients will discuss the potential positive impact on their wellness/recovery of focusing on positive traits of self and others. Summary of Patient Progress:    Therapeutic Modalities Cognitive Behavioral Therapy Motivational Interviewing  Ida RogueRodney B Durene Dodge, [place]CSW 07/08/2018 2:10 PM

## 2018-07-08 NOTE — Progress Notes (Signed)
NUTRITION ASSESSMENT  Pt identified as at risk on the Malnutrition Screen Tool  INTERVENTION: Supplements: continue Ensure Enlive BID, each supplement provides 350 kcal and 20 grams of protein.   NUTRITION DIAGNOSIS: Inadequate oral intake related to depression, SI as evidenced by patient report.   Goal: Pt to meet >/= 90% of their estimated nutrition needs.  Monitor:  PO intake  Assessment:  Patient admitted for depression and SI. Notes reviewed which outlines mental health history, alcohol abuse and cocaine use. Patient reported that his sister died in January and that he has had difficulty since that time. Patient reported being off of his psych medications for the past 1.5 months.   He reported that since going off of his medications he has lost 10-15 lbs. This is not corroborated by weights documented in patient's chart.  Ensure Enlive was ordered BID per ONS protocol at the time of admission. Continue to encourage PO intakes of meals, supplements, and snacks.       52 y.o. male  Height: Ht Readings from Last 1 Encounters:  07/07/18 5\' 10"  (1.778 m)    Weight: Wt Readings from Last 1 Encounters:  07/07/18 174 lb (78.9 kg)    Weight Hx: Wt Readings from Last 10 Encounters:  07/07/18 174 lb (78.9 kg)  07/06/18 175 lb (79.4 kg)  03/29/18 185 lb (83.9 kg)  02/11/18 177 lb (80.3 kg)  12/11/17 172 lb 8 oz (78.2 kg)  11/08/17 168 lb (76.2 kg)  11/05/17 171 lb 1.2 oz (77.6 kg)  10/22/17 200 lb (90.7 kg)  05/15/17 175 lb (79.4 kg)  05/15/17 170 lb (77.1 kg)    BMI:  Body mass index is 24.97 kg/m. Pt meets criteria for normal weight/borderline overweight based on current BMI.  Estimated Nutritional Needs: Kcal: 25-30 kcal/kg Protein: > 1 gram protein/kg Fluid: 1 ml/kcal  Diet Order:  Diet Order           Diet regular Room service appropriate? Yes; Fluid consistency: Thin  Diet effective now         Pt is also offered choice of unit snacks mid-morning  and mid-afternoon.  Pt is eating as desired.   Lab results and medications reviewed.      Trenton GammonJessica Filemon Breton, MS, RD, LDN, [facility] Inpatient Clinical Dietitian Pager # [phone] After hours/weekend pager # [phone]
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
## 2018-07-08 NOTE — Progress Notes (Signed)
DAR NOTE: Patient presents with anxious affect and irritable mood.  Denies pain, auditory and visual hallucinations.  Reports suicidal thoughts but verbally contracts for safety.  Rates depression at 8, hopelessness at 8, and anxiety at 8.  Maintained on routine safety checks.  Medications given as prescribed.  Support and encouragement offered as needed.  States goal for today is "to feel better physically."  Patient is withdrawn and isolates to his room most of this shift.  Complain of nausea and vomiting several times this shift.  Zofran 4 mg given with fair result.
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
## 2018-07-08 NOTE — Progress Notes (Signed)
Did not attend group 

## 2018-07-09 MED ORDER — TRAZODONE HCL 150 MG PO TABS
150.0000 mg | ORAL_TABLET | Freq: Every day | ORAL | Status: DC
  Administered 2018-07-09 – 2018-07-13 (×5): 150 mg via ORAL
  Filled 2018-07-09 (×8): qty 1

## 2018-07-09 MED ORDER — BUPROPION HCL ER (XL) 300 MG PO TB24
300.0000 mg | ORAL_TABLET | Freq: Every day | ORAL | Status: DC
  Administered 2018-07-09 – 2018-07-17 (×9): 300 mg via ORAL
  Filled 2018-07-09 (×11): qty 1

## 2018-07-09 MED ORDER — PANTOPRAZOLE SODIUM 40 MG PO TBEC
40.0000 mg | DELAYED_RELEASE_TABLET | Freq: Every day | ORAL | Status: DC
  Administered 2018-07-09 – 2018-07-17 (×9): 40 mg via ORAL
  Filled 2018-07-09 (×12): qty 1

## 2018-07-09 NOTE — Plan of Care (Signed)
  Problem: Activity: Goal: Interest or engagement in activities will improve Outcome: Progressing   Problem: Safety: Goal: Periods of time without injury will increase Outcome: Progressing   Problem: Self-Concept: Goal: Will verbalize positive feelings about self Outcome: Progressing  DAR NOTE: Patient presents with anxious affect and irritable mood.  Denies suicidal thoughts, pain, auditory and visual hallucinations.  Rates depression at 0, hopelessness at 0, and anxiety at 0.  Maintained on routine safety checks.  Medications given as prescribed.  Support and encouragement offered as needed.  Patient remained withdrawn and isolates to his room. Asked to be transferred back to 400 unit so he can socialize better with peers.  Requested and received Zofran 4 mg for complain of nausea with good effect.

## 2018-07-09 NOTE — Progress Notes (Signed)
Patient ID: Chase Moss, male   DOB: 11/01/1966, [age] y.o.   MRN: [id number]   D: Patient pleasant on approach today. Moved over to the 400 hall to program. Reports mood not much better with some passive SI at times. Reports stomach better at this time and he started on protonix today.  A: Staff will continue to monitor on q 15 minute checks, follow treatment plan, and give medications as ordered. R: Cooperative on the unit and took medications.

## 2018-07-09 NOTE — [facility] Suicide Risk Assessment (Signed)
[facility] INPATIENT:  Family/Significant Other Suicide Prevention Education  Suicide Prevention Education:  Patient Refusal for Family/Significant Other Suicide Prevention Education: The patient Chase Moss has refused to provide written consent for family/significant other to be provided Family/Significant Other Suicide Prevention Education during admission and/or prior to discharge.  Physician notified.  Ida RogueRodney B Shylin Keizer 07/09/2018, 1:57 PM

## 2018-07-09 NOTE — Progress Notes (Signed)
Recreation Therapy Notes  Date: 7.17.19 Time: 0950 Location: 500 Hall Dayroom  Group Topic: Self-Esteem  Goal Area(s) Addresses:  Patient will successfully identify positive attributes about themselves.  Patient will successfully identify benefit of improved self-esteem.   Intervention: Construction paper, scissors, glue sticks, magazines, markers, colored pencils  Activity: Collages. Patients were to create a collage that described and highlighted the positive things about them.  Education: Self-Esteem, Building control surveyorDischarge Planning.   Education Outcome: Acknowledges education/In group clarification offered/Needs additional education  Clinical Observations/Feedback: Pt did not attend group.    Caroll RancherMarjette Jeffrey Voth, LRT/CTRS      Caroll RancherLindsay, Dmarco Baldus A 07/09/2018 12:24 PM

## 2018-07-09 NOTE — Tx Team (Signed)
Interdisciplinary Treatment and Diagnostic Plan Update  07/09/2018 Time of Session: 8:54 AM  Chase Moss MRN: [id number]  Principal Diagnosis: <principal problem not specified>  Secondary Diagnoses: Active Problems:   Bipolar 1 disorder, depressed (HCC)   Current Medications:  Current Facility-Administered Medications  Medication Dose Route Frequency Provider Last Rate Last Dose  . alum & mag hydroxide-simeth (MAALOX/MYLANTA) 200-200-20 MG/5ML suspension 30 mL  30 mL Oral Q6H PRN Elmarie Shiley A, NP      . amLODipine (NORVASC) tablet 5 mg  5 mg Oral Daily Elmarie Shiley A, NP   5 mg at 07/09/18 0830  . ARIPiprazole (ABILIFY) tablet 15 mg  15 mg Oral Daily Elmarie Shiley A, NP   15 mg at 07/09/18 0830  . citalopram (CELEXA) tablet 10 mg  10 mg Oral Daily Elmarie Shiley A, NP   10 mg at 07/09/18 0829  . feeding supplement (ENSURE ENLIVE) (ENSURE ENLIVE) liquid 237 mL  237 mL Oral BID BM Cobos, Fernando A, MD      . gabapentin (NEURONTIN) capsule 300 mg  300 mg Oral TID Elmarie Shiley A, NP   300 mg at 07/09/18 0829  . hydrOXYzine (ATARAX/VISTARIL) tablet 50 mg  50 mg Oral TID PRN Niel Hummer, NP   50 mg at 07/07/18 2104  . ibuprofen (ADVIL,MOTRIN) tablet 600 mg  600 mg Oral Q8H PRN Elmarie Shiley A, NP   600 mg at 07/07/18 2105  . lisinopril (PRINIVIL,ZESTRIL) tablet 10 mg  10 mg Oral Daily Elmarie Shiley A, NP   10 mg at 07/09/18 0830  . magnesium hydroxide (MILK OF MAGNESIA) suspension 30 mL  30 mL Oral Daily PRN Elmarie Shiley A, NP      . mirtazapine (REMERON) tablet 15 mg  15 mg Oral QHS Elmarie Shiley A, NP   15 mg at 07/08/18 2117  . nicotine (NICODERM CQ - dosed in mg/24 hours) patch 21 mg  21 mg Transdermal Daily Elmarie Shiley A, NP   21 mg at 07/09/18 0830  . nicotine polacrilex (NICORETTE) gum 2 mg  2 mg Oral PRN Cobos, Myer Peer, MD      . ondansetron (ZOFRAN) tablet 4 mg  4 mg Oral Q8H PRN Elmarie Shiley A, NP   4 mg at 07/08/18 1747  . prazosin (MINIPRESS) capsule 2 mg  2 mg Oral QHS Elmarie Shiley A, NP   2 mg at 07/08/18 2114  . zolpidem (AMBIEN) tablet 5 mg  5 mg Oral QHS PRN Niel Hummer, NP   5 mg at 07/08/18 2117    PTA Medications: Medications Prior to Admission  Medication Sig Dispense Refill Last Dose  . amLODipine (NORVASC) 5 MG tablet Take 1 tablet (5 mg total) by mouth daily. For high blood pressure (Patient not taking: Reported on 07/06/2018) 30 tablet 0 Not Taking at Unknown time  . ARIPiprazole (ABILIFY) 15 MG tablet Take 1 tablet (15 mg total) by mouth daily. For mood control (Patient not taking: Reported on 07/06/2018) 30 tablet 0 Not Taking at Unknown time  . buPROPion (WELLBUTRIN XL) 300 MG 24 hr tablet Take 1 tablet (300 mg total) by mouth daily. (Patient not taking: Reported on 07/06/2018) 30 tablet 0 Not Taking at Unknown time  . gabapentin (NEURONTIN) 300 MG capsule Take 1 capsule (300 mg total) by mouth 3 (three) times daily. (Patient not taking: Reported on 07/06/2018) 90 capsule 0 Not Taking at Unknown time  . hydrOXYzine (ATARAX/VISTARIL) 50 MG tablet Take 1 tablet (50 mg) by  mouth four times daily as needed: For anxiety/sleep (Patient not taking: Reported on 07/06/2018) 75 tablet 0 Not Taking at Unknown time  . lisinopril (PRINIVIL,ZESTRIL) 10 MG tablet Take 1 tablet (10 mg total) by mouth daily. For high blood pressure (Patient not taking: Reported on 07/06/2018) 30 tablet 0 Not Taking at Unknown time  . mirtazapine (REMERON) 15 MG tablet Take 1 tablet (15 mg total) by mouth at bedtime. For depression/sleep (Patient not taking: Reported on 07/06/2018) 30 tablet 0 Not Taking at Unknown time  . prazosin (MINIPRESS) 2 MG capsule Take 1 capsule (2 mg total) by mouth at bedtime. For PTSD nightmares (Patient not taking: Reported on 07/06/2018) 30 capsule 0 Not Taking at Unknown time    Patient Stressors: Financial difficulties Loss of 2 family members within the past year and a half Medication change or noncompliance Traumatic event  Patient Strengths: Average or  above average intelligence Communication skills General fund of knowledge  Treatment Modalities: Medication Management, Group therapy, Case management,  1 to 1 session with clinician, Psychoeducation, Recreational therapy.   Physician Treatment Plan for Primary Diagnosis: <principal problem not specified> Long Term Goal(s): Improvement in symptoms so as ready for discharge  Short Term Goals:    Medication Management: Evaluate patient's response, side effects, and tolerance of medication regimen.  Therapeutic Interventions: 1 to 1 sessions, Unit Group sessions and Medication administration.  Evaluation of Outcomes: Progressing  Physician Treatment Plan for Secondary Diagnosis: Active Problems:   Bipolar 1 disorder, depressed ([place])   Long Term Goal(s): Improvement in symptoms so as ready for discharge  Short Term Goals:    Medication Management: Evaluate patient's response, side effects, and tolerance of medication regimen.  Therapeutic Interventions: 1 to 1 sessions, Unit Group sessions and Medication administration.  Evaluation of Outcomes: Progressing   RN Treatment Plan for Primary Diagnosis: <principal problem not specified> Long Term Goal(s): Knowledge of disease and therapeutic regimen to maintain health will improve  Short Term Goals: Ability to identify and develop effective coping behaviors will improve and Compliance with prescribed medications will improve  Medication Management: RN will administer medications as ordered by provider, will assess and evaluate patient's response and provide education to patient for prescribed medication. RN will report any adverse and/or side effects to prescribing provider.  Therapeutic Interventions: 1 on 1 counseling sessions, Psychoeducation, Medication administration, Evaluate responses to treatment, Monitor vital signs and CBGs as ordered, Perform/monitor CIWA, COWS, AIMS and Fall Risk screenings as ordered, Perform wound care  treatments as ordered.  Evaluation of Outcomes: Progressing   LCSW Treatment Plan for Primary Diagnosis: <principal problem not specified> Long Term Goal(s): Safe transition to appropriate next level of care at discharge, Engage patient in therapeutic group addressing interpersonal concerns.  Short Term Goals: Engage patient in aftercare planning with referrals and resources  Therapeutic Interventions: Assess for all discharge needs, 1 to 1 time with Social worker, Explore available resources and support systems, Assess for adequacy in community support network, Educate family and significant other(s) on suicide prevention, Complete Psychosocial Assessment, Interpersonal group therapy.  Evaluation of Outcomes: Not Met  Pt is unsure of where he will go at d/c.  Will follow up at local mental health clinic wherever he ends up.   Progress in Treatment: Attending groups: No Participating in groups: No Taking medication as prescribed: Yes Toleration medication: Yes, no side effects reported at this time Family/Significant other contact made: No Patient understands diagnosis: Yes AEB asking for help with SI, HI Discussing patient identified problems/goals  with staff: Yes Medical problems stabilized or resolved: Yes Denies suicidal/homicidal ideation: Yes Issues/concerns per patient self-inventory: None Other: N/A  New problem(s) identified: None identified at this time.   New Short Term/Long Term Goal(s): "I need these meds to kick in so I am not feeling like killing this guy anymore."  Discharge Plan or Barriers:   Reason for Continuation of Hospitalization:  Depression Hallucinations  Medication stabilization Suicidal ideation   Estimated Length of Stay: 7/22  Attendees: Patient: Chase Moss 07/09/2018  8:54 AM  Physician: Melba Coon, MD 07/09/2018  8:54 AM  Nursing: Sena Hitch, RN 07/09/2018  8:54 AM  RN Care Manager: [name] Pinks, RN 07/09/2018  8:54 AM   Social Worker: Ripley Fraise 07/09/2018  8:54 AM  Recreational Therapist: Winfield Cunas 07/09/2018  8:54 AM  Other: Norberto Sorenson 07/09/2018  8:54 AM  Other:  07/09/2018  8:54 AM    Scribe for Treatment Team:  Roque Lias LCSW 07/09/2018 8:54 AM
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
## 2018-07-09 NOTE — [facility] Group Notes (Signed)
LCSW Group Therapy Note  07/09/2018 1:15pm  Type of Therapy/Topic:  Group Therapy:  Emotion Regulation  Participation Level:  Did Not Attend   Description of Group:   The purpose of this group is to assist patients in learning to regulate negative emotions and experience positive emotions. Patients will be guided to discuss ways in which they have been vulnerable to their negative emotions. These vulnerabilities will be juxtaposed with experiences of positive emotions or situations, and patients will be challenged to use positive emotions to combat negative ones. Special emphasis will be placed on coping with negative emotions in conflict situations, and patients will process healthy conflict resolution skills.  Therapeutic Goals: 1. Patient will identify two positive emotions or experiences to reflect on in order to balance out negative emotions 2. Patient will label two or more emotions that they find the most difficult to experience 3. Patient will demonstrate positive conflict resolution skills through discussion and/or role plays  Summary of Patient Progress:       Therapeutic Modalities:   Cognitive Behavioral Therapy Feelings Identification Dialectical Behavioral Therapy   Ida RogueRodney B Azavier Creson, LCSW 07/09/2018 12:07 PM

## 2018-07-09 NOTE — [facility] Group Notes (Signed)
Patient did not attend orientation and goals group.  

## 2018-07-10 DIAGNOSIS — F319 Bipolar disorder, unspecified: Principal | ICD-10-CM

## 2018-07-10 DIAGNOSIS — R45850 Homicidal ideations: Secondary | ICD-10-CM

## 2018-07-10 DIAGNOSIS — F1414 Cocaine abuse with cocaine-induced mood disorder: Secondary | ICD-10-CM

## 2018-07-10 DIAGNOSIS — F17210 Nicotine dependence, cigarettes, uncomplicated: Secondary | ICD-10-CM

## 2018-07-10 DIAGNOSIS — F1210 Cannabis abuse, uncomplicated: Secondary | ICD-10-CM

## 2018-07-10 DIAGNOSIS — G4700 Insomnia, unspecified: Secondary | ICD-10-CM

## 2018-07-10 DIAGNOSIS — R450 Nervousness: Secondary | ICD-10-CM

## 2018-07-10 DIAGNOSIS — I10 Essential (primary) hypertension: Secondary | ICD-10-CM

## 2018-07-10 DIAGNOSIS — F1010 Alcohol abuse, uncomplicated: Secondary | ICD-10-CM

## 2018-07-10 MED ORDER — ARIPIPRAZOLE 15 MG PO TABS
15.0000 mg | ORAL_TABLET | Freq: Every day | ORAL | Status: DC
  Filled 2018-07-10 (×2): qty 1

## 2018-07-10 NOTE — Progress Notes (Addendum)
Pt was observed in the dayroom, attending wrap-up group. Pt appears animated/anxious in affect and mood. Pt brightens with interaction. Pt denies HI/AVH/Pain at this time. Endorses passive SI but verbal contracts for safety. PRN vistaril requested and given. No new c/o's. Will continue with POC.

## 2018-07-10 NOTE — Plan of Care (Signed)
Problem: Activity: Goal: Interest or engagement in activities will improve Outcome: Progressing   Problem: Safety: Goal: Periods of time without injury will increase Outcome: Progressing D: Pt A & O X3. Observed at medication window on initial contact. Denies HI, AVH and pain. Continues to endorse passive SI "it comes and go" without plan at this time. Presents with depressed affect and mood. Rates his depression, anxiety and hopelessness all 8/10 on self inventory sheet. C/O nausea earlier, declined PRN Zofran when offered "I'll come back if it gets worse"; gingerale given at the time. Pt's goal this shift "take my meds each day". Pt did not attend groups on unit, despite multiple prompts.  A: Introduced self to pt. Emotional support and availability provided to pt. All medications administered with verbal education and effects monitored. Writer continues to encourage compliance with treatment including groups. Safety checks maintained at Q 15 minutes intervals without outburst or self harm gestures.   R: Pt remains compliant with medications when offered. Denies adverse drug reactions when assessed this shift. Tolerates all PO intake well. Off unit for activities with peers, returned without issues. Safety maintained.

## 2018-07-10 NOTE — [facility] Group Notes (Signed)
[facility] LCSW Group Therapy Note  Date/Time: 07/10/18, 1315  Type of Therapy/Topic:  Group Therapy:  Balance in Life  Participation Level:  Did not attend  Description of Group:    This group will address the concept of balance and how it feels and looks when one is unbalanced. Patients will be encouraged to process areas in their lives that are out of balance, and identify reasons for remaining unbalanced. Facilitators will guide patients utilizing problem- solving interventions to address and correct the stressor making their life unbalanced. Understanding and applying boundaries will be explored and addressed for obtaining  and maintaining a balanced life. Patients will be encouraged to explore ways to assertively make their unbalanced needs known to significant others in their lives, using other group members and facilitator for support and feedback.  Therapeutic Goals: 1. Patient will identify two or more emotions or situations they have that consume much of in their lives. 2. Patient will identify signs/triggers that life has become out of balance:  3. Patient will identify two ways to set boundaries in order to achieve balance in their lives:  4. Patient will demonstrate ability to communicate their needs through discussion and/or role plays  Summary of Patient Progress:          Therapeutic Modalities:   Cognitive Behavioral Therapy Solution-Focused Therapy Assertiveness Training  Greg Zia Najera, LCSW 
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
## 2018-07-10 NOTE — Progress Notes (Signed)
Pt did not attend goals and orientation group this morning  

## 2018-07-11 LAB — URIC ACID: URIC ACID, SERUM: 6.8 mg/dL (ref 3.7–8.6)

## 2018-07-11 LAB — TSH: TSH: 0.826 u[IU]/mL (ref 0.350–4.500)

## 2018-07-11 MED ORDER — ARIPIPRAZOLE 10 MG PO TABS
20.0000 mg | ORAL_TABLET | Freq: Every day | ORAL | Status: DC
  Administered 2018-07-11 – 2018-07-16 (×6): 20 mg via ORAL
  Filled 2018-07-11 (×8): qty 2

## 2018-07-11 NOTE — [facility] Group Notes (Signed)
  [facility] LCSW Group Therapy Note  Date/Time: 07/11/18, 1315  Type of Therapy/Topic:  Group Therapy:  Emotion Regulation  Participation Level:  None   Mood:  Description of Group:    The purpose of this group is to assist patients in learning to regulate negative emotions and experience positive emotions. Patients will be guided to discuss ways in which they have been vulnerable to their negative emotions. These vulnerabilities will be juxtaposed with experiences of positive emotions or situations, and patients challenged to use positive emotions to combat negative ones. Special emphasis will be placed on coping with negative emotions in conflict situations, and patients will process healthy conflict resolution skills.  Therapeutic Goals: 1. Patient will identify two positive emotions or experiences to reflect on in order to balance out negative emotions:  2. Patient will label two or more emotions that they find the most difficult to experience:  3. Patient will be able to demonstrate positive conflict resolution skills through discussion or role plays:   Summary of Patient Progress:Pt joined group 5 minutes from the end.       Therapeutic Modalities:   Cognitive Behavioral Therapy Feelings Identification Dialectical Behavioral Therapy  Daleen SquibbGreg Abbygayle Helfand, LCSW

## 2018-07-11 NOTE — Plan of Care (Signed)
Nurse discussed depression, anxiety, coping skills with patient.  
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
## 2018-07-11 NOTE — Progress Notes (Signed)
Recreation Therapy Notes  Date: 7.19.19 Time: 0930 Location: [address] Dayroom  Group Topic: Stress Management  Goal Area(s) Addresses:  Patient will verbalize importance of using healthy stress management.  Patient will identify positive emotions associated with healthy stress management.   Intervention: Stress Management  Activity :  Mountain Meditation.  LRT introduced the stress management technique of meditation.  LRT played a played a meditation that allowed patients to take on the characteristics of a mountain.  Patients were to listen and follow along as the meditation.  Education:  Stress Management, Discharge Planning.   Education Outcome: Acknowledges edcuation/In group clarification offered/Needs additional education  Clinical Observations/Feedback: Pt did not attend group.      Joh Rao, LRT/CTRS         Raynie Steinhaus A 07/11/2018 11:29 AM 

## 2018-07-11 NOTE — Progress Notes (Signed)
D:  Patient's self inventory sheet, patient has fair sleep, no sleep medication given.  Fair appetite, low energy level, poor concentration.  Rated depression and hopeless 9, anxiety 8.  Denied withdrawals.  SI, contracts for safety, no plan.  Denied physical problems.  Goal "My attitude towards life and feelings of hopelessness."  Plans to "take meds.  I still having homicidal thoughts."  No discharge plans. A:  Medications administered per MD orders.  Emotional support and encouragement given patient. R:  Stated he does have HI thoughts to [place].  SI, no specific plan, contracts for safety.   Denied A/V hallucinations.  Stated his money comes on August 1.  No specific discharge plans.

## 2018-07-11 NOTE — Plan of Care (Signed)
D: Pt passive SI-contracts for safety. Pt seen in the dayroom with peers some this evening. Pt stated he felt a little better today.  A: Pt was offered support and encouragement. Pt was given scheduled medications. Pt was encourage to attend groups. Q 15 minute checks were done for safety.  R:Pt attends groups and interacts well with peers and staff. Pt is taking medication. Pt has no complaints.Pt receptive to treatment and safety maintained on unit.   Problem: Education: Goal: Emotional status will improve Outcome: Progressing   Problem: Education: Goal: Mental status will improve Outcome: Progressing   Problem: Activity: Goal: Sleeping patterns will improve Outcome: Progressing

## 2018-07-12 MED ORDER — MAGNESIUM HYDROXIDE 400 MG/5ML PO SUSP
15.0000 mL | Freq: Two times a day (BID) | ORAL | Status: DC
  Administered 2018-07-12 – 2018-07-16 (×3): 15 mL via ORAL

## 2018-07-12 MED ORDER — PRAZOSIN HCL 2 MG PO CAPS
4.0000 mg | ORAL_CAPSULE | Freq: Every day | ORAL | Status: DC
  Administered 2018-07-12 – 2018-07-14 (×3): 4 mg via ORAL
  Filled 2018-07-12 (×4): qty 2

## 2018-07-12 NOTE — [facility] Group Notes (Signed)
LCSW Group Therapy Note  07/12/2018    10:30-11:30am   Type of Therapy and Topic:  Group Therapy: Anger and Coping Skills  Participation Level:  Did Not Attend   Description of Group:   In this group, patients learned how to recognize the physical, cognitive, emotional, and behavioral responses they have to anger-provoking situations.  They identified how they usually or often react when angered, and learned how healthy and unhealthy coping skills work initially, but the unhealthy ones stop working.   They analyzed how their frequently-chosen coping skill is possibly beneficial and how it is possibly unhelpful.  The group discussed a variety of healthier coping skills that could help in resolving the actual issues, as well as how to go about planning for the the possibility of future similar situations.  Therapeutic Goals: 1. Patients will identify one thing that makes them angry and how they feel emotionally and physically, what their thoughts are or tend to be in those situations, and what healthy or unhealthy coping mechanism they typically use 2. Patients will identify how their coping technique works for them, as well as how it works against them. 3. Patients will explore possible new behaviors to use in future anger situations. 4. Patients will learn that anger itself is normal and cannot be eliminated, and that healthier coping skills can assist with resolving conflict rather than worsening situations.  Summary of Patient Progress: n/a  Therapeutic Modalities:   Cognitive Behavioral Therapy Motivation Interviewing  Chase Moss  .

## 2018-07-12 NOTE — Plan of Care (Signed)
Pt progressing in the following metrics  D: pt found in bed this morning. Pt compliant with medication administration. Pt states he slept poorly last night. Pt endorses passive si w/o a plan but verbally agrees to approach staff if these become apparent. Pt denies any hi/ah/vh and verbally agrees again to approach staff. Pt rates his depression/hopelessness/anxiety all 8/10. Pt states his goal for today is to change or stop wanting to die and will achieve this by trying not to think of ways to kill himself while in here.  A: pt provided support and encouragement. Pt provided medications per protocol and standing orders. [id number] safety checks implemented and continued. R: pt safe on the unit. Will continue to monitor.  Problem: Education: Goal: Emotional status will improve Outcome: Progressing Goal: Mental status will improve Outcome: Progressing   Problem: Activity: Goal: Interest or engagement in activities will improve Outcome: Progressing   Problem: Coping: Goal: Ability to verbalize frustrations and anger appropriately will improve Outcome: Progressing Goal: Ability to demonstrate self-control will improve Outcome: Progressing   Problem: Health Behavior/Discharge Planning: Goal: Compliance with treatment plan for underlying cause of condition will improve Outcome: Progressing   Problem: Physical Regulation: Goal: Ability to maintain clinical measurements within normal limits will improve Outcome: Progressing   Problem: Safety: Goal: Periods of time without injury will increase Outcome: Progressing   Problem: Self-Concept: Goal: Ability to disclose and discuss suicidal ideas will improve Outcome: Progressing   Problem: Safety: Goal: Ability to demonstrate self-control will improve Outcome: Progressing

## 2018-07-12 NOTE — Progress Notes (Signed)
[facility] MD Progress Note  07/12/2018 10:53 AM Chase Moss  MRN:  [id number] Subjective: Patient is seen and examined.  Patient is a 52 year old male with a past psychiatric history significant for bipolar disorder, alcohol use disorder, cannabis use disorder and recent admission secondary to suicidal ideation.  He is seen in follow-up.  He is doing much better today.  He came back to talk to me yesterday afternoon, and apologized for his outburst.  We discussed that.  I had increased his Abilify last night, and he slept a little bit better today.  He is much less labile, and has been working on his discharge plan.  He has discussed perhaps going to [place] where his brother lives, but the homeless shelter there will only consider people who are residents of [place].  He denied acute suicidal thoughts this morning.  He stated he continues to have vivid dreams, and we discussed potentially increasing his prazosin. Principal Problem: Bipolar 1 disorder, depressed (HCC) Diagnosis:   Patient Active Problem List   Diagnosis Date Noted  . Bipolar 1 disorder, depressed (HCC) [F31.9] 07/07/2018  . Homicidal ideation [R45.850]   . Alcohol abuse with alcohol-induced mood disorder (HCC) [F10.14] 03/29/2018  . Cannabis use disorder, mild, abuse [F12.10] 02/12/2018  . Bipolar I disorder (HCC) [F31.9] 02/11/2018  . MDD (major depressive disorder) [F32.9] 12/11/2017  . Intentional overdose of drug in tablet form (HCC) [T50.902A] 11/08/2017  . Dyslipidemia [E78.5] 11/08/2017  . Constipation [K59.00] 11/08/2017  . Drug overdose [T50.901A] 11/05/2017  . Acute alcoholic intoxication without complication (HCC) [F10.920]   . Suicidal ideation [R45.851]   . Alcohol intoxication (HCC) [F10.929] 10/22/2017  . Alcohol withdrawal (HCC) [F10.239] 10/22/2017  . Polysubstance abuse (HCC) [F19.10]   . 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 Cocaine abuse with  cocaine-induced mood disorder (HCC) [F14.14] 04/01/2017  . HTN (hypertension) [I10] 06/20/2016  . Tobacco use disorder [F17.200] 06/20/2016  . Trauma [T14.90XA] 07/27/2015  . Cannabis use disorder, moderate, dependence (HCC) [F12.20]   . PTSD (post-traumatic stress disorder) [F43.10]   . Substance induced mood disorder (HCC) [F19.94] 07/07/2014  . Alcohol use disorder, moderate, dependence (HCC) [F10.20] 04/10/2012  . Suicidal thoughts [R45.851] 04/04/2012   Total Time spent with patient: 20 minutes  Past Psychiatric History: See admission H&P  Past Medical History:  Past Medical History:  Diagnosis Date  . Bipolar 1 disorder (HCC)   . Depression   . Hypertension   . PTSD (post-traumatic stress disorder)    History reviewed. No pertinent surgical history. Family History:  Family History  Problem Relation Age of Onset  . Heart attack Other    Family Psychiatric  History: See admission H&P Social History:  Social History   Substance and Sexual Activity  Alcohol Use Yes  . Alcohol/week: 1.2 oz  . Types: 2 Cans of beer per week   Comment: Pt states "I've only drank (1) 40 in 3 months"      Social History   Substance and Sexual Activity  Drug Use Yes  . Types: Cocaine, Marijuana    Social History   Socioeconomic History  . Marital status: Divorced    Spouse name: Not on file  . Number of children: Not on file  . Years of education: Not on file  . Highest education level: Not on file  Occupational History  . Not on file  Social Needs  . Financial resource strain: Not on file  . Food insecurity:  Worry: Not on file    Inability: Not on file  . Transportation needs:    Medical: Not on file    Non-medical: Not on file  Tobacco Use  . Smoking status: Current Every Day Smoker    Packs/day: 1.00    Years: 30.00    Pack years: 30.00    Types: Cigarettes  . Smokeless tobacco: Never Used  Substance and Sexual Activity  . Alcohol use: Yes    Alcohol/week: 1.2  oz    Types: 2 Cans of beer per week    Comment: Pt states "I've only drank (1) 40 in 3 months"   . Drug use: Yes    Types: Cocaine, Marijuana  . Sexual activity: Yes  Lifestyle  . Physical activity:    Days per week: Not on file    Minutes per session: Not on file  . Stress: Not on file  Relationships  . Social connections:    Talks on phone: Not on file    Gets together: Not on file    Attends religious service: Not on file    Active member of club or organization: Not on file    Attends meetings of clubs or organizations: Not on file    Relationship status: Not on file  Other Topics Concern  . Not on file  Social History Narrative  . Not on file   Additional Social History:    Pain Medications: denies Prescriptions: denies Over the Counter: denies History of alcohol / drug use?: Yes Longest period of sobriety (when/how long): 3 months sobriety from alcohol ended last night (07/05/18) Negative Consequences of Use: Financial, Personal relationships Withdrawal Symptoms: Other (Comment)(anxiety) Name of Substance 1: Marijuana 1 - Age of First Use: 52 years old 1 - Amount (size/oz): varies 1 - Frequency: daily 1 - Duration: on-going 1 - Last Use / Amount: 07/05/18                  Sleep: Fair  Appetite:  Good  Current Medications: Current Facility-Administered Medications  Medication Dose Route Frequency Provider Last Rate Last Dose  . alum & mag hydroxide-simeth (MAALOX/MYLANTA) 200-200-20 MG/5ML suspension 30 mL  30 mL Oral Q6H PRN Fransisca Kaufmann A, NP      . amLODipine (NORVASC) tablet 5 mg  5 mg Oral Daily Fransisca Kaufmann A, NP   5 mg at 07/12/18 0845  . ARIPiprazole (ABILIFY) tablet 20 mg  20 mg Oral QHS Antonieta Pert, MD   20 mg at 07/11/18 2127  . buPROPion (WELLBUTRIN XL) 24 hr tablet 300 mg  300 mg Oral Daily Mariel Craft, MD   300 mg at 07/12/18 0845  . feeding supplement (ENSURE ENLIVE) (ENSURE ENLIVE) liquid 237 mL  237 mL Oral BID BM Cobos,  Rockey Situ, MD   237 mL at 07/12/18 0846  . hydrOXYzine (ATARAX/VISTARIL) tablet 50 mg  50 mg Oral TID PRN Thermon Leyland, NP   50 mg at 07/11/18 2127  . ibuprofen (ADVIL,MOTRIN) tablet 600 mg  600 mg Oral Q8H PRN Fransisca Kaufmann A, NP   600 mg at 07/07/18 2105  . lisinopril (PRINIVIL,ZESTRIL) tablet 10 mg  10 mg Oral Daily Fransisca Kaufmann A, NP   10 mg at 07/12/18 0845  . magnesium hydroxide (MILK OF MAGNESIA) suspension 30 mL  30 mL Oral Daily PRN Fransisca Kaufmann A, NP      . mirtazapine (REMERON) tablet 15 mg  15 mg Oral QHS Thermon Leyland, NP  15 mg at 07/11/18 2127  . nicotine (NICODERM CQ - dosed in mg/24 hours) patch 21 mg  21 mg Transdermal Daily Fransisca Kaufmann A, NP   21 mg at 07/09/18 0830  . nicotine polacrilex (NICORETTE) gum 2 mg  2 mg Oral PRN Cobos, Rockey Situ, MD      . ondansetron (ZOFRAN) tablet 4 mg  4 mg Oral Q8H PRN Fransisca Kaufmann A, NP   4 mg at 07/08/18 1747  . pantoprazole (PROTONIX) EC tablet 40 mg  40 mg Oral Daily Mariel Craft, MD   40 mg at 07/12/18 0845  . prazosin (MINIPRESS) capsule 4 mg  4 mg Oral QHS Antonieta Pert, MD      . traZODone (DESYREL) tablet 150 mg  150 mg Oral QHS Mariel Craft, MD   150 mg at 07/11/18 2127    Lab Results:  Results for orders placed or performed during the hospital encounter of 07/07/18 (from the past 48 hour(s))  TSH     Status: None   Collection Time: 07/11/18  6:52 AM  Result Value Ref Range   TSH 0.826 0.350 - 4.500 uIU/mL    Comment: Performed by a 3rd Generation assay with a functional sensitivity of <=0.01 uIU/mL. Performed at [facility], [address], [place], [place] [address]   Uric acid     Status: None   Collection Time: 07/11/18  6:52 AM  Result Value Ref Range   Uric Acid, Serum 6.8 3.7 - 8.6 mg/dL    Comment: Please note change in reference range. Performed at [facility], [address] [place], [place] [address]     Blood Alcohol level:  Lab Results  Component  Value Date   ETH 19 (H) 07/06/2018   ETH <10 05/01/2018    Metabolic Disorder Labs: Lab Results  Component Value Date   HGBA1C 5.2 06/21/2016   MPG 100 04/09/2012   Lab Results  Component Value Date   PROLACTIN 21.0 (H) 06/21/2016   Lab Results  Component Value Date   CHOL 152 11/07/2017   TRIG 142 11/07/2017   HDL 40 (L) 11/07/2017   CHOLHDL 3.8 11/07/2017   VLDL 28 11/07/2017   LDLCALC 84 11/07/2017   LDLCALC 88 06/21/2016    Physical Findings: AIMS: Facial and Oral Movements Muscles of Facial Expression: None, normal Lips and Perioral Area: None, normal Jaw: None, normal Tongue: None, normal,Extremity Movements Upper (arms, wrists, hands, fingers): None, normal Lower (legs, knees, ankles, toes): None, normal, Trunk Movements Neck, shoulders, hips: None, normal, Overall Severity Severity of abnormal movements (highest score from questions above): None, normal Incapacitation due to abnormal movements: None, normal Patient's awareness of abnormal movements (rate only patient's report): No Awareness, Dental Status Current problems with teeth and/or dentures?: No Does patient usually wear dentures?: No  CIWA:  CIWA-Ar Total: 1 COWS:  COWS Total Score: 1  Musculoskeletal: Strength & Muscle Tone: within normal limits Gait & Station: normal Patient leans: N/A  Psychiatric Specialty Exam: Physical Exam  Nursing note and vitals reviewed. Constitutional: He is oriented to person, place, and time. He appears well-developed and well-nourished.  HENT:  Head: Normocephalic and atraumatic.  Respiratory: Effort normal.  Neurological: He is alert and oriented to person, place, and time.    ROS  Blood pressure 105/71, pulse 73, temperature (!) 97.5 F (36.4 C), temperature source Oral, resp. rate 20, height 5\' 10"  (1.778 m), weight 78.9 kg (174 lb), SpO2 98 %.Body mass index  is 24.97 kg/m.  General Appearance: Casual  Eye Contact:  Fair  Speech:  Normal Rate  Volume:   Normal  Mood:  Anxious and Depressed  Affect:  Congruent  Thought Process:  Coherent  Orientation:  Full (Time, Place, and Person)  Thought Content:  Logical  Suicidal Thoughts:  Yes.  without intent/plan  Homicidal Thoughts:  No  Memory:  Immediate;   Fair Recent;   Fair Remote;   Fair  Judgement:  Intact  Insight:  Fair  Psychomotor Activity:  Normal  Concentration:  Concentration: Fair and Attention Span: Fair  Recall:  [name]  Fund of Knowledge:  Fair  Language:  Good  Akathisia:  Negative  Handed:  Right  AIMS (if indicated):     Assets:  Communication Skills Desire for Improvement Physical Health Resilience  ADL's:  Intact  Cognition:  WNL  Sleep:  Number of Hours: 6.75     Treatment Plan Summary: Daily contact with patient to assess and evaluate symptoms and progress in treatment, Medication management and Plan Patient is seen and examined.  Patient is a 52 year old male with the above-stated past psychiatric history seen in follow-up.  He is much less irritable today, much less labile.  His Abilify was increased last night, but he continues to have vivid dreams.  We discussed potentially increasing his prazosin.  I am going to increase it to 4 mg p.o. nightly to see if that improves the situation.  He is participating in his own care, and working on housing options currently.  No other changes in his medications.  His blood pressure is stable this a.m., and he remains afebrile.  Antonieta PertGreg Lawson Senya Hinzman, MD 07/12/2018, 10:53 AM

## 2018-07-12 NOTE — Progress Notes (Signed)
Adult Psychoeducational Group Note  Date:  07/12/2018 Time:  9:30 PM  Group Topic/Focus:  Wrap-Up Group:   The focus of this group is to help patients review their daily goal of treatment and discuss progress on daily workbooks.  Participation Level:  Active  Participation Quality:  Appropriate  Affect:  Appropriate  Cognitive:  Appropriate  Insight: Appropriate  Engagement in Group:  Engaged  Modes of Intervention:  Discussion  Additional Comments:  Patient attended group and participated.   Chase Moss W Mily Malecki 07/12/2018, 9:30 PM

## 2018-07-13 MED ORDER — AMLODIPINE BESYLATE 10 MG PO TABS
10.0000 mg | ORAL_TABLET | Freq: Every day | ORAL | Status: DC
  Administered 2018-07-14 – 2018-07-17 (×4): 10 mg via ORAL
  Filled 2018-07-13 (×6): qty 1

## 2018-07-13 NOTE — Plan of Care (Signed)
Pt progressing in the following metrics  D: Pt found in the dayroom interacting with peers. Pt states he didn't sleep that well last night because his roommate was snoring. Pt denies any physical pain at this time. Pt rates his depression/hopelessness/anxiety a 10/10/8 out of 10 respectively. Pt states his goal for today is to work on his depression and stop feeling homicidal and suicidal. The pt will achieve this by trying to express, vent and change his feelings. When this writer asked if he was having any si/hi/ah/vh the pt denied all of these and verbally agreed to approach staff if they became apparent. The pt's self inventory though stated he was having thoughts of hurting himself. Pt compliant with medication administration. A: pt provided support and encouragement. Pt provided medications per protocol and standing orders. Q457m safety checks implemented and continued.  R: pt safe on the unit. Will continue to monitor.   Problem: Education: Goal: Emotional status will improve Outcome: Progressing Goal: Mental status will improve Outcome: Progressing   Problem: Activity: Goal: Interest or engagement in activities will improve Outcome: Progressing Goal: Sleeping patterns will improve Outcome: Progressing   Problem: Coping: Goal: Ability to demonstrate self-control will improve Outcome: Progressing   Problem: Health Behavior/Discharge Planning: Goal: Compliance with treatment plan for underlying cause of condition will improve Outcome: Progressing   Problem: Physical Regulation: Goal: Ability to maintain clinical measurements within normal limits will improve Outcome: Progressing   Problem: Safety: Goal: Periods of time without injury will increase Outcome: Progressing   Problem: Self-Concept: Goal: Ability to disclose and discuss suicidal ideas will improve Outcome: Progressing   Problem: Safety: Goal: Ability to redirect hostility and anger into socially appropriate  behaviors will improve Outcome: Progressing

## 2018-07-13 NOTE — Progress Notes (Signed)
D.  Pt pleasant on approach, no complaints voiced at this time.  Pt was positive for evening wrap up group and came up for bedtime medications shortly after.  Pt denies SI/HI/AVH at this time.  A.  Support and encouragement offered, medication given as ordered  R.  Pt remains safe on the unit, will continue to monitor.

## 2018-07-13 NOTE — Progress Notes (Signed)
D:  Chase Moss was in his room at the beginning of the shift.  He reported that he was feeling depressed and having suicidal ideation.  No plan or intent.  He did agree to seek out staff if that changes.  He denied HI or A/V hallucinations.  He stated that he doesn't feel like "my future is bright."  He did go to evening wrap up group and ate his snack.  He is currently having difficulty sleeping because his roommate snores.  Offered ear plugs and he accepted.  He is currently resting with his eyes closed.  He appears to be asleep. A:  1:1 with RN for support and encouragement.  Medications as ordered.  Q 15 minute checks maintained for safety.  Encouraged participation in group and unit activities.   R:  Chase Moss remains safe on the unit.  We will continue to monitor the progress towards his goals.

## 2018-07-13 NOTE — Progress Notes (Signed)
[facility] MD Progress Note  07/13/2018 12:02 PM Byard Carranza  MRN:  [id number] Subjective: Patient is seen and examined.  Patient is a 52 year old male with a past psychiatric history significant for bipolar disorder, alcohol use disorder, cannabis use disorder and recent admission secondary to suicidal ideation.  He is seen in follow-up.  He is a little fatigued this morning.  His roommate snored all night, and he did not sleep well.  His main concern this morning still has to do with where he is going to go after his hospitalization.  He believes that if he is released to the homelessness that he will relapse as soon as he is out, and that the risk for harm to the person who ran the [address] facility was that would be at risk.  We discussed that today.  He denied any suicidal ideation to day, and had no complaints about his medications.  We discussed consulting with social work tomorrow morning to have a more clear idea of what options are available outside of homelessness. Principal Problem: Bipolar 1 disorder, depressed (HCC) Diagnosis:   Patient Active Problem List   Diagnosis Date Noted  . Bipolar 1 disorder, depressed (HCC) [F31.9] 07/07/2018  . Homicidal ideation [R45.850]   . Alcohol abuse with alcohol-induced mood disorder (HCC) [F10.14] 03/29/2018  . Cannabis use disorder, mild, abuse [F12.10] 02/12/2018  . Bipolar I disorder (HCC) [F31.9] 02/11/2018  . MDD (major depressive disorder) [F32.9] 12/11/2017  . Intentional overdose of drug in tablet form (HCC) [T50.902A] 11/08/2017  . Dyslipidemia [E78.5] 11/08/2017  . Constipation [K59.00] 11/08/2017  . Drug overdose [T50.901A] 11/05/2017  . Acute alcoholic intoxication without complication (HCC) [F10.920]   . Suicidal ideation [R45.851]   . Alcohol intoxication (HCC) [F10.929] 10/22/2017  . Alcohol withdrawal (HCC) [F10.239] 10/22/2017  . Polysubstance abuse (HCC) [F19.10]   . 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 Cocaine abuse with cocaine-induced mood disorder (HCC) [F14.14] 04/01/2017  . HTN (hypertension) [I10] 06/20/2016  . Tobacco use disorder [F17.200] 06/20/2016  . Trauma [T14.90XA] 07/27/2015  . Cannabis use disorder, moderate, dependence (HCC) [F12.20]   . PTSD (post-traumatic stress disorder) [F43.10]   . Substance induced mood disorder (HCC) [F19.94] 07/07/2014  . Alcohol use disorder, moderate, dependence (HCC) [F10.20] 04/10/2012  . Suicidal thoughts [R45.851] 04/04/2012   Total Time spent with patient: 20 minutes  Past Psychiatric History: See admission H&P  Past Medical History:  Past Medical History:  Diagnosis Date  . Bipolar 1 disorder (HCC)   . Depression   . Hypertension   . PTSD (post-traumatic stress disorder)    History reviewed. No pertinent surgical history. Family History:  Family History  Problem Relation Age of Onset  . Heart attack Other    Family Psychiatric  History: See admission H&P Social History:  Social History   Substance and Sexual Activity  Alcohol Use Yes  . Alcohol/week: 1.2 oz  . Types: 2 Cans of beer per week   Comment: Pt states "I've only drank (1) 40 in 3 months"      Social History   Substance and Sexual Activity  Drug Use Yes  . Types: Cocaine, Marijuana    Social History   Socioeconomic History  . Marital status: Divorced    Spouse name: Not on file  . Number of children: Not on file  . Years of education: Not on file  . Highest education level: Not on file  Occupational History  . Not on  file  Social Needs  . Financial resource strain: Not on file  . Food insecurity:    Worry: Not on file    Inability: Not on file  . Transportation needs:    Medical: Not on file    Non-medical: Not on file  Tobacco Use  . Smoking status: Current Every Day Smoker    Packs/day: 1.00    Years: 30.00    Pack years: 30.00    Types: Cigarettes  . Smokeless tobacco: Never Used  Substance and  Sexual Activity  . Alcohol use: Yes    Alcohol/week: 1.2 oz    Types: 2 Cans of beer per week    Comment: Pt states "I've only drank (1) 40 in 3 months"   . Drug use: Yes    Types: Cocaine, Marijuana  . Sexual activity: Yes  Lifestyle  . Physical activity:    Days per week: Not on file    Minutes per session: Not on file  . Stress: Not on file  Relationships  . Social connections:    Talks on phone: Not on file    Gets together: Not on file    Attends religious service: Not on file    Active member of club or organization: Not on file    Attends meetings of clubs or organizations: Not on file    Relationship status: Not on file  Other Topics Concern  . Not on file  Social History Narrative  . Not on file   Additional Social History:    Pain Medications: denies Prescriptions: denies Over the Counter: denies History of alcohol / drug use?: Yes Longest period of sobriety (when/how long): 3 months sobriety from alcohol ended last night (07/05/18) Negative Consequences of Use: Financial, Personal relationships Withdrawal Symptoms: Other (Comment)(anxiety) Name of Substance 1: Marijuana 1 - Age of First Use: 52 years old 1 - Amount (size/oz): varies 1 - Frequency: daily 1 - Duration: on-going 1 - Last Use / Amount: 07/05/18                  Sleep: Fair  Appetite:  Good  Current Medications: Current Facility-Administered Medications  Medication Dose Route Frequency Provider Last Rate Last Dose  . alum & mag hydroxide-simeth (MAALOX/MYLANTA) 200-200-20 MG/5ML suspension 30 mL  30 mL Oral Q6H PRN Fransisca Kaufmannavis, Laura A, NP      . amLODipine (NORVASC) tablet 5 mg  5 mg Oral Daily Fransisca Kaufmannavis, Laura A, NP   5 mg at 07/13/18 0736  . ARIPiprazole (ABILIFY) tablet 20 mg  20 mg Oral QHS Antonieta Pertlary, Greg Lawson, MD   20 mg at 07/12/18 2109  . buPROPion (WELLBUTRIN XL) 24 hr tablet 300 mg  300 mg Oral Daily Mariel CraftMaurer, Sheila M, MD   300 mg at 07/13/18 0736  . feeding supplement (ENSURE ENLIVE)  (ENSURE ENLIVE) liquid 237 mL  237 mL Oral BID BM Cobos, Fernando A, MD   237 mL at 07/13/18 1146  . hydrOXYzine (ATARAX/VISTARIL) tablet 50 mg  50 mg Oral TID PRN Thermon Leylandavis, Laura A, NP   50 mg at 07/12/18 1419  . ibuprofen (ADVIL,MOTRIN) tablet 600 mg  600 mg Oral Q8H PRN Thermon Leylandavis, Laura A, NP   600 mg at 07/07/18 2105  . lisinopril (PRINIVIL,ZESTRIL) tablet 10 mg  10 mg Oral Daily Fransisca Kaufmannavis, Laura A, NP   10 mg at 07/13/18 0736  . magnesium hydroxide (MILK OF MAGNESIA) suspension 15 mL  15 mL Oral BID Antonieta Pertlary, Greg Lawson, MD   15 mL  at 07/13/18 0736  . magnesium hydroxide (MILK OF MAGNESIA) suspension 30 mL  30 mL Oral Daily PRN Fransisca Kaufmann A, NP   30 mL at 07/12/18 1423  . mirtazapine (REMERON) tablet 15 mg  15 mg Oral QHS Fransisca Kaufmann A, NP   15 mg at 07/12/18 2109  . nicotine (NICODERM CQ - dosed in mg/24 hours) patch 21 mg  21 mg Transdermal Daily Fransisca Kaufmann A, NP   21 mg at 07/09/18 0830  . nicotine polacrilex (NICORETTE) gum 2 mg  2 mg Oral PRN Cobos, Rockey Situ, MD      . ondansetron (ZOFRAN) tablet 4 mg  4 mg Oral Q8H PRN Fransisca Kaufmann A, NP   4 mg at 07/08/18 1747  . pantoprazole (PROTONIX) EC tablet 40 mg  40 mg Oral Daily Mariel Craft, MD   40 mg at 07/13/18 0737  . prazosin (MINIPRESS) capsule 4 mg  4 mg Oral QHS Antonieta Pert, MD   4 mg at 07/12/18 2109  . traZODone (DESYREL) tablet 150 mg  150 mg Oral QHS Mariel Craft, MD   150 mg at 07/12/18 2109    Lab Results: No results found for this or any previous visit (from the past 48 hour(s)).  Blood Alcohol level:  Lab Results  Component Value Date   ETH 19 (H) 07/06/2018   ETH <10 05/01/2018    Metabolic Disorder Labs: Lab Results  Component Value Date   HGBA1C 5.2 06/21/2016   MPG 100 04/09/2012   Lab Results  Component Value Date   PROLACTIN 21.0 (H) 06/21/2016   Lab Results  Component Value Date   CHOL 152 11/07/2017   TRIG 142 11/07/2017   HDL 40 (L) 11/07/2017   CHOLHDL 3.8 11/07/2017   VLDL 28 11/07/2017    LDLCALC 84 11/07/2017   LDLCALC 88 06/21/2016    Physical Findings: AIMS: Facial and Oral Movements Muscles of Facial Expression: None, normal Lips and Perioral Area: None, normal Jaw: None, normal Tongue: None, normal,Extremity Movements Upper (arms, wrists, hands, fingers): None, normal Lower (legs, knees, ankles, toes): None, normal, Trunk Movements Neck, shoulders, hips: None, normal, Overall Severity Severity of abnormal movements (highest score from questions above): None, normal Incapacitation due to abnormal movements: None, normal Patient's awareness of abnormal movements (rate only patient's report): No Awareness, Dental Status Current problems with teeth and/or dentures?: No Does patient usually wear dentures?: No  CIWA:  CIWA-Ar Total: 1 COWS:  COWS Total Score: 1  Musculoskeletal: Strength & Muscle Tone: within normal limits Gait & Station: normal Patient leans: N/A  Psychiatric Specialty Exam: Physical Exam  Nursing note and vitals reviewed. Constitutional: He is oriented to person, place, and time. He appears well-developed and well-nourished.  HENT:  Head: Normocephalic and atraumatic.  Respiratory: Effort normal.  Neurological: He is alert and oriented to person, place, and time.    ROS  Blood pressure (!) 146/92, pulse 86, temperature (!) 97.5 F (36.4 C), temperature source Oral, resp. rate (!) 22, height 5\' 10"  (1.778 m), weight 78.9 kg (174 lb), SpO2 98 %.Body mass index is 24.97 kg/m.  General Appearance: Casual  Eye Contact:  Fair  Speech:  Normal Rate  Volume:  Normal  Mood:  Anxious  Affect:  Congruent  Thought Process:  Coherent  Orientation:  Full (Time, Place, and Person)  Thought Content:  Logical  Suicidal Thoughts:  Yes.  without intent/plan  Homicidal Thoughts:  Yes.  without intent/plan  Memory:  Immediate;  Fair Recent;   Fair Remote;   Fair  Judgement:  Intact  Insight:  Fair  Psychomotor Activity:  Normal  Concentration:   Concentration: Fair and Attention Span: Fair  Recall:  [employer] of Knowledge:  Fair  Language:  Fair  Akathisia:  Negative  Handed:  Right  AIMS (if indicated):     Assets:  Desire for Improvement Physical Health Resilience  ADL's:  Intact  Cognition:  WNL  Sleep:  Number of Hours: 6.75     Treatment Plan Summary: Daily contact with patient to assess and evaluate symptoms and progress in treatment, Medication management and Plan Patient is seen and examined.  Patient is a 52 year old male with the above psychiatric history seen in follow-up.  He was fatigued today secondary to not sleeping well because of his snoring roommate.  Otherwise his medications are stable.  His blood pressure remains elevated, so I am going to increase his amlodipine to 10 mg p.o. daily.  He will meet with social work tomorrow a.m. to clarify housing options for him after the hospitalization.  I do believe that he is at high risk for relapse and self-harm if we do not get him into some form of a stable environment after discharge.  No change to the Abilify, Wellbutrin, lisinopril, Remeron  Antonieta Pert, MD 07/13/2018, 12:02 PM

## 2018-07-13 NOTE — [facility] Group Notes (Signed)
[facility] Group Notes: (Clinical Social Work)   07/13/2018      Type of Therapy:  Group Therapy   Participation Level:  Did Not Attend despite MHT prompting   Ambrose MantleMareida Grossman-Orr, LCSW 07/13/2018, 12:56 PM

## 2018-07-14 MED ORDER — TRAZODONE HCL 100 MG PO TABS
200.0000 mg | ORAL_TABLET | Freq: Every day | ORAL | Status: DC
  Administered 2018-07-14: 200 mg via ORAL
  Filled 2018-07-14 (×2): qty 2

## 2018-07-14 MED ORDER — MIRTAZAPINE 7.5 MG PO TABS
7.5000 mg | ORAL_TABLET | Freq: Every day | ORAL | Status: DC
  Administered 2018-07-14 – 2018-07-16 (×3): 7.5 mg via ORAL
  Filled 2018-07-14 (×5): qty 1

## 2018-07-14 NOTE — Progress Notes (Signed)
Adult Psychoeducational Group Note  Date:  07/14/2018 Time:  3:58 AM  Group Topic/Focus:  Wrap-Up Group:   The focus of this group is to help patients review their daily goal of treatment and discuss progress on daily workbooks.  Participation Level:  Active  Participation Quality:  Appropriate  Affect:  Appropriate  Cognitive:  Appropriate  Insight: Appropriate  Engagement in Group: Not very engaged   Modes of Intervention:  Discussion  Additional Comments:  Pt did not want to discuss his day but rated it at a 3/10.  Aubria Vanecek 07/14/2018, 3:58 AM

## 2018-07-14 NOTE — Tx Team (Signed)
Interdisciplinary Treatment and Diagnostic Plan Update  07/14/2018 Time of Session: 0845 Chase Moss MRN: [id number]  Principal Diagnosis: Bipolar 1 disorder, depressed ([place])  Secondary Diagnoses: Principal Problem:   Bipolar 1 disorder, depressed ([place]) Active Problems:   Suicidal thoughts   Alcohol use disorder, moderate, dependence ([place])   Cannabis use disorder, moderate, dependence ([place])   PTSD (post-traumatic stress disorder)   HTN (hypertension)   Tobacco use disorder   Cocaine abuse with cocaine-induced mood disorder ([place])   Homicidal ideation   Current Medications:  Current Facility-Administered Medications  Medication Dose Route Frequency Provider Last Rate Last Dose  . alum & mag hydroxide-simeth (MAALOX/MYLANTA) 200-200-20 MG/5ML suspension 30 mL  30 mL Oral Q6H PRN Elmarie Shiley A, NP      . amLODipine (NORVASC) tablet 10 mg  10 mg Oral Daily Sharma Covert, MD   10 mg at 07/14/18 1317  . ARIPiprazole (ABILIFY) tablet 20 mg  20 mg Oral QHS Sharma Covert, MD   20 mg at 07/13/18 2100  . buPROPion (WELLBUTRIN XL) 24 hr tablet 300 mg  300 mg Oral Daily Lavella Hammock, MD   300 mg at 07/14/18 1316  . feeding supplement (ENSURE ENLIVE) (ENSURE ENLIVE) liquid 237 mL  237 mL Oral BID BM Cobos, Fernando A, MD   237 mL at 07/14/18 1318  . hydrOXYzine (ATARAX/VISTARIL) tablet 50 mg  50 mg Oral TID PRN Niel Hummer, NP   50 mg at 07/12/18 1419  . ibuprofen (ADVIL,MOTRIN) tablet 600 mg  600 mg Oral Q8H PRN Niel Hummer, NP   600 mg at 07/07/18 2105  . lisinopril (PRINIVIL,ZESTRIL) tablet 10 mg  10 mg Oral Daily Elmarie Shiley A, NP   10 mg at 07/14/18 1316  . magnesium hydroxide (MILK OF MAGNESIA) suspension 15 mL  15 mL Oral BID Sharma Covert, MD   15 mL at 07/13/18 0736  . magnesium hydroxide (MILK OF MAGNESIA) suspension 30 mL  30 mL Oral Daily PRN Elmarie Shiley A, NP   30 mL at 07/12/18 1423  . mirtazapine (REMERON) tablet 7.5 mg  7.5 mg Oral QHS Sharma Covert, MD      . nicotine (NICODERM CQ - dosed in mg/24 hours) patch 21 mg  21 mg Transdermal Daily Elmarie Shiley A, NP   21 mg at 07/09/18 0830  . nicotine polacrilex (NICORETTE) gum 2 mg  2 mg Oral PRN Cobos, Myer Peer, MD      . ondansetron (ZOFRAN) tablet 4 mg  4 mg Oral Q8H PRN Elmarie Shiley A, NP   4 mg at 07/08/18 1747  . pantoprazole (PROTONIX) EC tablet 40 mg  40 mg Oral Daily Lavella Hammock, MD   40 mg at 07/14/18 1317  . prazosin (MINIPRESS) capsule 4 mg  4 mg Oral QHS Sharma Covert, MD   4 mg at 07/13/18 2101  . traZODone (DESYREL) tablet 200 mg  200 mg Oral QHS Mallie Darting Cordie Grice, MD        PTA Medications: Medications Prior to Admission  Medication Sig Dispense Refill Last Dose  . amLODipine (NORVASC) 5 MG tablet Take 1 tablet (5 mg total) by mouth daily. For high blood pressure (Patient not taking: Reported on 07/06/2018) 30 tablet 0 Not Taking at Unknown time  . ARIPiprazole (ABILIFY) 15 MG tablet Take 1 tablet (15 mg total) by mouth daily. For mood control (Patient not taking: Reported on 07/06/2018) 30 tablet 0 Not Taking at Unknown time  .  buPROPion (WELLBUTRIN XL) 300 MG 24 hr tablet Take 1 tablet (300 mg total) by mouth daily. (Patient not taking: Reported on 07/06/2018) 30 tablet 0 Not Taking at Unknown time  . gabapentin (NEURONTIN) 300 MG capsule Take 1 capsule (300 mg total) by mouth 3 (three) times daily. (Patient not taking: Reported on 07/06/2018) 90 capsule 0 Not Taking at Unknown time  . hydrOXYzine (ATARAX/VISTARIL) 50 MG tablet Take 1 tablet (50 mg) by mouth four times daily as needed: For anxiety/sleep (Patient not taking: Reported on 07/06/2018) 75 tablet 0 Not Taking at Unknown time  . lisinopril (PRINIVIL,ZESTRIL) 10 MG tablet Take 1 tablet (10 mg total) by mouth daily. For high blood pressure (Patient not taking: Reported on 07/06/2018) 30 tablet 0 Not Taking at Unknown time  . mirtazapine (REMERON) 15 MG tablet Take 1 tablet (15 mg total) by mouth at bedtime.  For depression/sleep (Patient not taking: Reported on 07/06/2018) 30 tablet 0 Not Taking at Unknown time  . prazosin (MINIPRESS) 2 MG capsule Take 1 capsule (2 mg total) by mouth at bedtime. For PTSD nightmares (Patient not taking: Reported on 07/06/2018) 30 capsule 0 Not Taking at Unknown time    Patient Stressors: Financial difficulties Loss of 2 family members within the past year and a half Medication change or noncompliance Traumatic event  Patient Strengths: Average or above average intelligence Communication skills General fund of knowledge  Treatment Modalities: Medication Management, Group therapy, Case management,  1 to 1 session with clinician, Psychoeducation, Recreational therapy.   Physician Treatment Plan for Primary Diagnosis: Bipolar 1 disorder, depressed ([place]) Long Term Goal(s): Improvement in symptoms so as ready for discharge  Short Term Goals: Ability to identify changes in lifestyle to reduce recurrence of condition will improve Ability to verbalize feelings will improve Ability to disclose and discuss suicidal ideas Ability to identify triggers associated with substance abuse/mental health issues will improve Ability to identify changes in lifestyle to reduce recurrence of condition will improve Ability to verbalize feelings will improve Ability to disclose and discuss suicidal ideas Compliance with prescribed medications will improve  Medication Management: Evaluate patient's response, side effects, and tolerance of medication regimen.  Therapeutic Interventions: 1 to 1 sessions, Unit Group sessions and Medication administration.  Evaluation of Outcomes: Progressing  Physician Treatment Plan for Secondary Diagnosis: Principal Problem:   Bipolar 1 disorder, depressed ([place]) Active Problems:   Suicidal thoughts   Alcohol use disorder, moderate, dependence (HCC)   Cannabis use disorder, moderate, dependence (HCC)   PTSD (post-traumatic stress disorder)    HTN (hypertension)   Tobacco use disorder   Cocaine abuse with cocaine-induced mood disorder ([place])   Homicidal ideation   Long Term Goal(s): Improvement in symptoms so as ready for discharge  Short Term Goals: Ability to identify changes in lifestyle to reduce recurrence of condition will improve Ability to verbalize feelings will improve Ability to disclose and discuss suicidal ideas Ability to identify triggers associated with substance abuse/mental health issues will improve Ability to identify changes in lifestyle to reduce recurrence of condition will improve Ability to verbalize feelings will improve Ability to disclose and discuss suicidal ideas Compliance with prescribed medications will improve  Medication Management: Evaluate patient's response, side effects, and tolerance of medication regimen.  Therapeutic Interventions: 1 to 1 sessions, Unit Group sessions and Medication administration.  Evaluation of Outcomes: Progressing   RN Treatment Plan for Primary Diagnosis: Bipolar 1 disorder, depressed ([place]) Long Term Goal(s): Knowledge of disease and therapeutic regimen to maintain health will improve  Short Term Goals: Ability to identify and develop effective coping behaviors will improve and Compliance with prescribed medications will improve  Medication Management: RN will administer medications as ordered by provider, will assess and evaluate patient's response and provide education to patient for prescribed medication. RN will report any adverse and/or side effects to prescribing provider.  Therapeutic Interventions: 1 on 1 counseling sessions, Psychoeducation, Medication administration, Evaluate responses to treatment, Monitor vital signs and CBGs as ordered, Perform/monitor CIWA, COWS, AIMS and Fall Risk screenings as ordered, Perform wound care treatments as ordered.  Evaluation of Outcomes: Progressing   LCSW Treatment Plan for Primary Diagnosis: Bipolar 1  disorder, depressed ([place]) Long Term Goal(s): Safe transition to appropriate next level of care at discharge, Engage patient in therapeutic group addressing interpersonal concerns.  Short Term Goals: Engage patient in aftercare planning with referrals and resources  Therapeutic Interventions: Assess for all discharge needs, 1 to 1 time with Social worker, Explore available resources and support systems, Assess for adequacy in community support network, Educate family and significant other(s) on suicide prevention, Complete Psychosocial Assessment, Interpersonal group therapy.  Evaluation of Outcomes: Not Met  Pt is unsure of where he will go at d/c.  Will follow up at local mental health clinic wherever he ends up.   Progress in Treatment: Attending groups: yes Participating in groups: yes Taking medication as prescribed: Yes Toleration medication: Yes Family/Significant other contact made: No, pt refused consent Patient understands diagnosis: Yes AEB asking for help with SI, HI Discussing patient identified problems/goals with staff: Yes Medical problems stabilized or resolved: Yes Denies suicidal/homicidal ideation: Yes Issues/concerns per patient self-inventory: None Other: N/A  New problem(s) identified: None identified at this time.   New Short Term/Long Term Goal(s): "I need these meds to kick in so I am not feeling like killing this guy anymore."  Discharge Plan or Barriers:   Reason for Continuation of Hospitalization:  Depression Hallucinations  Medication stabilization Suicidal ideation   Estimated Length of Stay: 2-4 days Attendees: Patient: 07/14/2018   Physician: Dr. Mallie Darting, MD 07/14/2018   Nursing: Neldon Newport, RN 07/14/2018   RN Care Manager: 07/14/2018   Social Worker: Lurline Idol, LCSW 07/14/2018   Recreational Therapist:  07/14/2018   Other:  07/14/2018   Other:  07/14/2018   Other: 07/14/2018         Scribe for Treatment Team:  Roque Lias LCSW  07/14/2018 2:52 PM

## 2018-07-14 NOTE — Progress Notes (Signed)
Recreation Therapy Notes  Date: 7.22.19 Time: 0930 Location: [address] Dayroom  Group Topic: Stress Management  Goal Area(s) Addresses:  Patient will verbalize importance of using healthy stress management.  Patient will identify positive emotions associated with healthy stress management.   Intervention: Stress Management  Activity :  Guided Imagery.  LRT introduced the stress management technique of guided imagery.  LRT read a script that allowed patients to envision floating on a cloud.  Patients were to listen and follow along as LRT read script to engage in the activity.  Education:  Stress Management, Discharge Planning.   Education Outcome: Acknowledges edcuation/In group clarification offered/Needs additional education  Clinical Observations/Feedback: Pt did not attend group.     Kristyna Bradstreet, LRT/CTRS         Shawndale Kilpatrick A 07/14/2018 1:44 PM 

## 2018-07-14 NOTE — Plan of Care (Signed)
  Problem: Activity: Goal: Sleeping patterns will improve Outcome: Progressing   Problem: Education: Goal: Emotional status will improve Outcome: Not Progressing   
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
## 2018-07-14 NOTE — Plan of Care (Signed)
Pt progressing in the following metrics  D: pt allowed to sleep in this morning because the pt states he hasn't slept the last week because his roommate snores. Pt woken before lunch. Pt states he slept well. Pt denies any si/hi/ah/vh and verbally agrees to approach staff if these become apparent. Pt rates his depression/hopelessness/anxiety all 4 out of 10. Pt states his goal for today is to arrange an exit plan. Pt will achieve this by talking with the doctor. Pt denies any physically pain at this time.  A: pt provided support and encouragement. Pt provided medications per protocol and standing orders. Q5237m safety checks implemented and continued.  R: pt safe on the unit. Will continue to monitor.   Problem: Education: Goal: Emotional status will improve Outcome: Progressing Goal: Verbalization of understanding the information provided will improve Outcome: Progressing   Problem: Activity: Goal: Interest or engagement in activities will improve Outcome: Progressing Goal: Sleeping patterns will improve Outcome: Progressing   Problem: Health Behavior/Discharge Planning: Goal: Identification of resources available to assist in meeting health care needs will improve Outcome: Progressing   Problem: Safety: Goal: Periods of time without injury will increase Outcome: Progressing   Problem: Health Behavior/Discharge Planning: Goal: Ability to implement measures to prevent violent behavior in the future will improve Outcome: Progressing   Problem: Safety: Goal: Ability to demonstrate self-control will improve Outcome: Progressing Goal: Ability to redirect hostility and anger into socially appropriate behaviors will improve Outcome: Progressing

## 2018-07-14 NOTE — [facility] Group Notes (Signed)
[facility] LCSW Group Therapy Note  Date/Time: 07/14/18, 1315  Type of Therapy and Topic:  Group Therapy:  Overcoming Obstacles  Participation Level:  moderate  Description of Group:    In this group patients will be encouraged to explore what they see as obstacles to their own wellness and recovery. They will be guided to discuss their thoughts, feelings, and behaviors related to these obstacles. The group will process together ways to cope with barriers, with attention given to specific choices patients can make. Each patient will be challenged to identify changes they are motivated to make in order to overcome their obstacles. This group will be process-oriented, with patients participating in exploration of their own experiences as well as giving and receiving support and challenge from other group members.  Therapeutic Goals: 1. Patient will identify personal and current obstacles as they relate to admission. 2. Patient will identify barriers that currently interfere with their wellness or overcoming obstacles.  3. Patient will identify feelings, thought process and behaviors related to these barriers. 4. Patient will identify two changes they are willing to make to overcome these obstacles:    Summary of Patient Progress: Pt reports housing, substance use as obstacles in his life.  Pt made several comments during the group discussion regarding steps to take to overcome obstacles.      Therapeutic Modalities:   Cognitive Behavioral Therapy Solution Focused Therapy Motivational Interviewing Relapse Prevention Therapy  Daleen SquibbGreg Sheridan Hew, LCSW

## 2018-07-15 MED ORDER — TRAZODONE HCL 150 MG PO TABS
150.0000 mg | ORAL_TABLET | Freq: Every day | ORAL | Status: DC
  Administered 2018-07-15 – 2018-07-16 (×2): 150 mg via ORAL
  Filled 2018-07-15 (×4): qty 1

## 2018-07-15 MED ORDER — PRAZOSIN HCL 2 MG PO CAPS
2.0000 mg | ORAL_CAPSULE | Freq: Every day | ORAL | Status: DC
  Administered 2018-07-15 – 2018-07-16 (×2): 2 mg via ORAL
  Filled 2018-07-15 (×4): qty 1

## 2018-07-15 NOTE — Progress Notes (Signed)
Adult Psychoeducational Group Note  Date:  07/15/2018 Time:  2:25 AM  Group Topic/Focus:  Wrap-Up Group:   The focus of this group is to help patients review their daily goal of treatment and discuss progress on daily workbooks.  Participation Level:  Active  Participation Quality:  Appropriate  Affect:  Appropriate  Cognitive:  Appropriate  Insight: Appropriate  Engagement in Group:  Engaged  Modes of Intervention:  Discussion  Additional Comments:  Pt stated that he feels as though his meds are working and he is feeling less depressed. Pt rated the day at 5/10.  Damyra Luscher 07/15/2018, 2:25 AM

## 2018-07-15 NOTE — Progress Notes (Signed)
Pt reports he had a good day.  He denies SI/HI/AVH at this time.  He reports going to groups and participating with the unit activities.  He is still not sure where he will be going at discharge, and says he is still having nightmares although the minipress is helping.  He is hopeful for a better night sleep since he has a new roommate tonight.  Pt voices no other needs or concerns at this time.  Support and encouragement offered.  Discharge plans are in process.  Safety maintained with q15 minute checks.

## 2018-07-15 NOTE — Progress Notes (Signed)
Recreation Therapy Notes  Animal-Assisted Activity (AAA) Program Checklist/Progress Notes Patient Eligibility Criteria Checklist & Daily Group note for Rec Tx Intervention  Date: 7.23.19 Time: 1430 Location: [address] Group Room   AAA/T Program Assumption of Risk Form signed by Patient/ or Parent Legal Guardian YES   Patient is free of allergies or sever asthma YES   Patient reports no fear of animals YES  Patient reports no history of cruelty to animals YES   Patient understands his/her participation is voluntary YES   Patient washes hands before animal contact YES   Patient washes hands after animal contact YES   Behavioral Response: Engaged  Education: Charity fundraiserHand Washing, Appropriate Animal Interaction   Education Outcome: Acknowledges understanding/In group clarification offered/Needs additional education.   Clinical Observations/Feedback:  Pt attended and participated in activity.    Caroll RancherMarjette Jurnie Garritano, LRT/CTRS         Caroll RancherLindsay, Salimata Christenson A 07/15/2018 3:40 PM

## 2018-07-15 NOTE — [facility] Group Notes (Signed)
LCSW Group Therapy Note   07/15/2018  1:30PM  Type of Therapy/Topic: Group Therapy: Feelings about Diagnosis  Participation Level: Minimal   Description of Group:  This group will allow patients to explore their thoughts and feelings about diagnoses they have received. Patients will be guided to explore their level of understanding and acceptance of these diagnoses. Facilitator will encourage patients to process their thoughts and feelings about the reactions of others to their diagnosis and will guide patients in identifying ways to discuss their diagnosis with significant others in their lives. This group will be process-oriented, with patients participating in exploration of their own experiences, giving and receiving support, and processing challenge from other group members.  Therapeutic Goals: 1. Patient will demonstrate understanding of diagnosis as evidenced by identifying two or more symptoms of the disorder 2. Patient will be able to express two feelings regarding the diagnosis 3. Patient will demonstrate their ability to communicate their needs through discussion and/or role play  Summary of Patient Progress: Patient was quiet for much of the group discussion. He identified self-esteem as an issue with people acknowledging their mental health or addiction diagnosis. He also identified two positive personal traits that make him a unique individual.   Therapeutic Modalities:  Cognitive Behavioral Therapy Brief Therapy Feelings Identification    Roselyn Beringegina Bronx Brogden, MSW, LCSW Clinical Social Worker Phone: [phone]
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
## 2018-07-15 NOTE — Progress Notes (Signed)
Nursing Progress Note: 7p-7a D: Pt currently presents with a sad/flat/depressed affect and behavior. Pt states "My day was a 5 out of 10. I feel sad, and I don't want to interact with anyone. I just want to be myself. The weather has me down too." Interacting minimally with the milieu. Pt reports good sleep during the previous night with current medication regimen. Pt did not attend wrap-up group.  A: Pt provided with medications per providers orders. Pt's labs and vitals were monitored throughout the night. Pt supported emotionally and encouraged to express concerns and questions. Pt educated on medications.  R: Pt's safety ensured with 15 minute and environmental checks. Pt currently denies HI and AVH and endorses intermittent passive SI. Pt verbally contracts to seek staff if SI,HI, or AVH occurs and to consult with staff before acting on any harmful thoughts. Will continue to monitor.

## 2018-07-15 NOTE — Plan of Care (Signed)
Pt progressing in the following metrics  D: pt found in bed. Pt states he slept better than he has but is still having trouble sleeping. Pt compliant with medication administration. Pt rates his depression/hopelessness/anxiety an 8/8/5 out of 10 respectively. Pt states he is having no physical pain at this time. Pt states he is still having passive SI but no hi/ah/vh and verbally agrees to approach staff if these become apparent or before harming himself. Pt states his goal for today is to reduce his suicidal ideations by not contemplating on them so much. Pt will achieve this by trying not to focus or think of it so much.  A: pt provided support and encouragement. Pt provided medications per protocol and standing orders. Q801m safety checks implemented and continued. R: pt safe on the unit. Will continue to monitor.  Problem: Coping: Goal: Ability to verbalize frustrations and anger appropriately will improve Outcome: Progressing Goal: Ability to demonstrate self-control will improve Outcome: Progressing   Problem: Physical Regulation: Goal: Ability to maintain clinical measurements within normal limits will improve Outcome: Progressing   Problem: Safety: Goal: Periods of time without injury will increase Outcome: Progressing

## 2018-07-16 LAB — HEMOGLOBIN A1C
HEMOGLOBIN A1C: 5.4 % (ref 4.8–5.6)
Mean Plasma Glucose: 108.28 mg/dL

## 2018-07-16 LAB — LIPID PANEL
CHOLESTEROL: 260 mg/dL — AB (ref 0–200)
HDL: 59 mg/dL (ref >40)
LDL Cholesterol: 153 mg/dL — ABNORMAL HIGH (ref 0–99)
TRIGLYCERIDES: 241 mg/dL — AB (ref <150)
Total CHOL/HDL Ratio: 4.4 RATIO
VLDL: 48 mg/dL — ABNORMAL HIGH (ref 0–40)

## 2018-07-16 NOTE — Progress Notes (Signed)
CSW and Dr Parke Poisson met with pt to discuss options.  Pt brother in [place] has not responded to recent phone calls.  Pt said brother is off work today and pt will try again to contact him.  We discussed options of going to [facility], going to [employer], or attempting to find halfway house type option.  Pt reports that his preference is to get a bus ticket to [place].  He thinks that if he is physically there, his brother will most likely not turn him away.  He would like to get a room there and employment and start over in [place].  CSW to pursue bus ticket.   Winferd Humphrey, MSW, LCSW Clinical Social Worker 07/16/2018 1:19 PM

## 2018-07-16 NOTE — [facility] Group Notes (Signed)
[facility] 07/16/2018 1:15pm  Type of Therapy: Mental Health Association Presentation  Participation Level: Did not attend  Participation Quality:   Affect:   Cognitive:   Insight:   Engagement in Therapy:   Modes of Intervention: Discussion, Education and Socialization  Summary of Progress/Problems: Mental Health Association (MHA) Speaker came to talk about his personal journey with mental health. The pt processed ways by which to relate to the speaker. MHA speaker provided handouts and educational information pertaining to groups and services offered by [facility]. Pt was engaged in speaker's presentation and was receptive to resources provided.    Lorri FrederickWierda, Ronith Jon, LCSW 07/16/2018 12:17 PM

## 2018-07-16 NOTE — Progress Notes (Signed)
Recreation Therapy Notes  Date: 7.24.19 Time: 0930 Location: [address] Dayroom  Group Topic: Stress Management  Goal Area(s) Addresses:  Patient will verbalize importance of using healthy stress management.  Patient will identify positive emotions associated with healthy stress management.   Intervention: Stress Management  Activity : Meditation.  LRT introduced the stress management technique of meditation.  LRT played Moss meditation that focused on choices.  Patients were to follow along as meditation was read to fully engage in the activity.  Education:  Stress Management, Discharge Planning.   Education Outcome: Acknowledges edcuation/In group clarification offered/Needs additional education  Clinical Observations/Feedback: Pt did not attend group.    Chase Moss, LRT/CTRS         Chase Moss 07/16/2018 10:57 AM 

## 2018-07-16 NOTE — Progress Notes (Signed)
Adult Psychoeducational Group Note  Date:  07/16/2018 Time:  9:38 PM  Group Topic/Focus:  Wrap-Up Group:   The focus of this group is to help patients review their daily goal of treatment and discuss progress on daily workbooks.  Participation Level:  Active  Participation Quality:  Appropriate  Affect:  Appropriate  Cognitive:  Alert  Insight: Appropriate  Engagement in Group:  Engaged  Modes of Intervention:  Discussion  Additional Comments:  Patient rated his day a 6. Patient's goal for today was to get in touch with a family member. Patient did not meet his goal.   Jana Swartzlander L Asal Teas 07/16/2018, 9:38 PM

## 2018-07-16 NOTE — Therapy (Signed)
Occupational Therapy Group Note  Date:  07/16/2018 Time:  3:11 PM  Group Topic/Focus:  Stress Management  Participation Level:  Minimal  Participation Quality:  Appropriate  Affect:  Flat  Cognitive:  Appropriate  Insight: Improving  Engagement in Group:  Limited  Modes of Intervention:  Activity, Discussion, Education and Socialization  Additional Comments:    S: "I like to go on nature walks"  O: Stress management group completed to use as productive coping strategy, to help mitigate maladaptive coping to integrate in functional BADL/IADL. Stress management tool worksheet discussed to educate on unhealthy vs healthy coping skills to manage stress to improve community integration. Coping strategies taught include: relaxation based- deep breathing, counting to 10, taking a 1 minute vacation, acceptance, stress balls, relaxation audio/video, visual/mental imagery. Positive mental attitude- gratitude, acceptance, cognitive reframing, positive self talk, anger management. Coping skills bingo played with education given on variety of coping skills between bingo calls. Pts encouraged to share experience with various coping skills and share what has worked for them with others. Coloring and relaxation guide handouts given at the end of the session.   A: Pt presents to group with flat affect, engaged but minimally sharing throughout group. Stress management tools worksheet completed, pt stating he most enjoys going on nature walks to relieve stress. Pt engaged in coping skills bingo with much enjoyment.  P: Pt provided with education on stress management activities to implement into daily routine. Handouts given to facilitate carryover when reintegrating into community     [facility] Center LLCKaylee Ayme Short, [place] YorkMSOT, OTR/L  AvnetKaylee Laruen Risser 07/16/2018, 3:11 PM

## 2018-07-16 NOTE — Progress Notes (Addendum)
Nursing Progress Note: 7p-7a D:Pt currently presents with a sad/flat/depressed/anxiousaffect and behavior. Pt states "I am very anxious. I don't feel ready. My plan to discharge is no plan." Interactingminimallywith the milieu. Pt reports goodsleep during the previous night with current medication regimen. Pt did attend wrap-up group.  A:Pt provided with medications per providers orders. Pt's labs and vitals were monitored throughout the night. Pt supported emotionally and encouraged to express concerns and questions. Pt educated on medications.  R:Pt's safety ensured with 15 minute and environmental checks. Pt currently denies HIand AVH and endorses intermittent passive SI. Pt verbally contracts to seek staff if SI,HI, or AVH occurs and to consult with staff before acting on any harmful thoughts. Will continue to monitor.

## 2018-07-16 NOTE — Plan of Care (Signed)
Nurse discussed suicide thoughts with patient.

## 2018-07-16 NOTE — [facility] Group Notes (Addendum)
[facility] Group Notes:  (Nursing/MHT/Case Management/Adjunct)  Date:  07/16/2018  Time:  1:15 pm  Type of Therapy:  Psychoeducational Skills  Participation Level:  Active  Participation Quality:  Appropriate  Affect:  Appropriate  Cognitive:  Appropriate  Insight:  Appropriate  Engagement in Group:  Engaged  Modes of Intervention:  Discussion and Education  Summary of Progress/Problems:  Patient participated appropriately in group.  Earline MayotteKnight, Torri Michalski Shephard 07/16/2018, 2:13 PM

## 2018-07-16 NOTE — Progress Notes (Addendum)
D:  Patient's self inventory sheet, patient has fair sleep, sleep medication helpful.  Fair appetite, low energy level, good concentration.  Rated depression 9, hopeless and anxiety 8.  Denied withdrawals.  SI, sometimes, contracts for safety.  Denied physical problems.  Denied physical pain.  Goal is depression, thoughts of suicide and homicide.  .  Try not to dwell on said thoughts.  Does have discharge plans.  Homeless. A:  Medications administered per MD orders.  Emotional support and encouragement given patient. R:   Denied A/V hallucinations.  Safety maintained with 15 minute checks. Patient explained to nurse this morning that he does have thoughts to run behind counter in cafeteria and get a knife.  That he does have thoughts to hit his head into the wall.  Does have HI thoughts [facility].  Denied A/V hallucination.   [facility]  Patient stated he is homeless and does not want to be discharged today.   Patient contracts for safety.  MD and staff informed of patient's statements.

## 2018-07-16 NOTE — Progress Notes (Signed)
[facility] MD Progress Note  07/16/2018 1:03 PM Chase Moss  MRN:  [id number] Subjective: Patient  reports ongoing anxiety, vague depression, but does acknowledge he is feeling better and today presents more focused and better able to discuss disposition planning.  He is future oriented and spoke about different options.  He is first and preferred option would be to  relocate to [place], as he has a brother there who is supportive.  States he is unsure whether his brother will allow him to stay there, but he might be able to stay in a local shelter until his next check.  Another option is to try to get into an [facility].  He would also consider going to [facility] , but states that he  would rather not go to [facility] because he has not been there before. Currently does not endorse medication side effects.  Objective : I have discussed case with treatment team and have met with patient. 52 year old male with a past psychiatric history significant for bipolar disorder, alcohol use disorder, cannabis use disorder and recent admission secondary to suicidal ideation.  At this time patient presents anxious, focusing on disposition plans, ruminative about where he will go after discharge, but clearly future oriented and considering different options.  Denies suicidal ideations.  Today denies homicidal ideations and states "I plan to stay away from the people pissed me off, I do not need any trouble". Denies medication side effects. Improved milieu participation today, visible on unit, limited group participation. Labs reviewed as below.  Principal Problem: Bipolar 1 disorder, depressed ([place]) Diagnosis:   Patient Active Problem List   Diagnosis Date Noted  . Bipolar 1 disorder, depressed ([place]) [F31.9] 07/07/2018  . Homicidal ideation [R45.850]   . Alcohol abuse with alcohol-induced mood disorder ([place]) [F10.14] 03/29/2018  . Cannabis use disorder, mild, abuse [F12.10] 02/12/2018  . Bipolar I disorder ([place]) [F31.9]  02/11/2018  . MDD (major depressive disorder) [F32.9] 12/11/2017  . Intentional overdose of drug in tablet form ([place]) [T50.902A] 11/08/2017  . Dyslipidemia [E78.5] 11/08/2017  . Constipation [K59.00] 11/08/2017  . Drug overdose [T50.901A] 11/05/2017  . Acute alcoholic intoxication without complication ([place]) [H67.591]   . Suicidal ideation [R45.851]   . Alcohol intoxication ([place]) [F10.929] 10/22/2017  . Alcohol withdrawal ([place]) [F10.239] 10/22/2017  . Polysubstance abuse ([place]) [F19.10]   . 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 Cocaine abuse with cocaine-induced mood disorder ([place]) [F14.14] 04/01/2017  . HTN (hypertension) [I10] 06/20/2016  . Tobacco use disorder [F17.200] 06/20/2016  . Trauma [T14.90XA] 07/27/2015  . Cannabis use disorder, moderate, dependence ([place]) [F12.20]   . PTSD (post-traumatic stress disorder) [F43.10]   . Substance induced mood disorder ([place]) [F19.94] 07/07/2014  . Alcohol use disorder, moderate, dependence ([place]) [F10.20] 04/10/2012  . Suicidal thoughts [R45.851] 04/04/2012   Total Time spent with patient: 20 minutes  Past Psychiatric History: See admission H&P  Past Medical History:  Past Medical History:  Diagnosis Date  . Bipolar 1 disorder ([place])   . Depression   . Hypertension   . PTSD (post-traumatic stress disorder)    History reviewed. No pertinent surgical history. Family History:  Family History  Problem Relation Age of Onset  . Heart attack Other    Family Psychiatric  History: See admission H&P Social History:  Social History   Substance and Sexual Activity  Alcohol Use Yes  . Alcohol/week: 1.2 oz  . Types: 2 Cans of beer per week   Comment:  Pt states "I've only drank (1) 40 in 3 months"      Social History   Substance and Sexual Activity  Drug Use Yes  . Types: Cocaine, Marijuana    Social History   Socioeconomic History  . Marital status: Divorced    Spouse name:  Not on file  . Number of children: Not on file  . Years of education: Not on file  . Highest education level: Not on file  Occupational History  . Not on file  Social Needs  . Financial resource strain: Not on file  . Food insecurity:    Worry: Not on file    Inability: Not on file  . Transportation needs:    Medical: Not on file    Non-medical: Not on file  Tobacco Use  . Smoking status: Current Every Day Smoker    Packs/day: 1.00    Years: 30.00    Pack years: 30.00    Types: Cigarettes  . Smokeless tobacco: Never Used  Substance and Sexual Activity  . Alcohol use: Yes    Alcohol/week: 1.2 oz    Types: 2 Cans of beer per week    Comment: Pt states "I've only drank (1) 40 in 3 months"   . Drug use: Yes    Types: Cocaine, Marijuana  . Sexual activity: Yes  Lifestyle  . Physical activity:    Days per week: Not on file    Minutes per session: Not on file  . Stress: Not on file  Relationships  . Social connections:    Talks on phone: Not on file    Gets together: Not on file    Attends religious service: Not on file    Active member of club or organization: Not on file    Attends meetings of clubs or organizations: Not on file    Relationship status: Not on file  Other Topics Concern  . Not on file  Social History Narrative  . Not on file   Additional Social History:    Pain Medications: denies Prescriptions: denies Over the Counter: denies History of alcohol / drug use?: Yes Longest period of sobriety (when/how long): 3 months sobriety from alcohol ended last night (07/05/18) Negative Consequences of Use: Financial, Personal relationships Withdrawal Symptoms: Other (Comment)(anxiety) Name of Substance 1: Marijuana 1 - Age of First Use: 52 years old 1 - Amount (size/oz): varies 1 - Frequency: daily 1 - Duration: on-going 1 - Last Use / Amount: 07/05/18   Sleep: Improving  Appetite:  Good  Current Medications: Current Facility-Administered Medications   Medication Dose Route Frequency Provider Last Rate Last Dose  . alum & mag hydroxide-simeth (MAALOX/MYLANTA) 200-200-20 MG/5ML suspension 30 mL  30 mL Oral Q6H PRN Elmarie Shiley A, NP      . amLODipine (NORVASC) tablet 10 mg  10 mg Oral Daily Sharma Covert, MD   10 mg at 07/16/18 6314  . ARIPiprazole (ABILIFY) tablet 20 mg  20 mg Oral QHS Sharma Covert, MD   20 mg at 07/15/18 2103  . buPROPion (WELLBUTRIN XL) 24 hr tablet 300 mg  300 mg Oral Daily Lavella Hammock, MD   300 mg at 07/16/18 0831  . feeding supplement (ENSURE ENLIVE) (ENSURE ENLIVE) liquid 237 mL  237 mL Oral BID BM Cobos, Fernando A, MD   237 mL at 07/16/18 1018  . hydrOXYzine (ATARAX/VISTARIL) tablet 50 mg  50 mg Oral TID PRN Niel Hummer, NP   50 mg at 07/15/18 2229  .  ibuprofen (ADVIL,MOTRIN) tablet 600 mg  600 mg Oral Q8H PRN Elmarie Shiley A, NP   600 mg at 07/07/18 2105  . lisinopril (PRINIVIL,ZESTRIL) tablet 10 mg  10 mg Oral Daily Elmarie Shiley A, NP   10 mg at 07/16/18 7340  . magnesium hydroxide (MILK OF MAGNESIA) suspension 15 mL  15 mL Oral BID Sharma Covert, MD   15 mL at 07/16/18 [phone]  . magnesium hydroxide (MILK OF MAGNESIA) suspension 30 mL  30 mL Oral Daily PRN Elmarie Shiley A, NP   30 mL at 07/12/18 1423  . mirtazapine (REMERON) tablet 7.5 mg  7.5 mg Oral QHS Sharma Covert, MD   7.5 mg at 07/15/18 2103  . nicotine (NICODERM CQ - dosed in mg/24 hours) patch 21 mg  21 mg Transdermal Daily Elmarie Shiley A, NP   21 mg at 07/09/18 0830  . nicotine polacrilex (NICORETTE) gum 2 mg  2 mg Oral PRN Cobos, Myer Peer, MD      . ondansetron (ZOFRAN) tablet 4 mg  4 mg Oral Q8H PRN Elmarie Shiley A, NP   4 mg at 07/08/18 1747  . pantoprazole (PROTONIX) EC tablet 40 mg  40 mg Oral Daily Lavella Hammock, MD   40 mg at 07/16/18 6438  . prazosin (MINIPRESS) capsule 2 mg  2 mg Oral QHS Cobos, Myer Peer, MD   2 mg at 07/15/18 2103  . traZODone (DESYREL) tablet 150 mg  150 mg Oral QHS Cobos, Myer Peer, MD   150 mg at  07/15/18 2103    Lab Results:  Results for orders placed or performed during the hospital encounter of 07/07/18 (from the past 48 hour(s))  Hemoglobin A1c     Status: None   Collection Time: 07/16/18  6:50 AM  Result Value Ref Range   Hgb A1c MFr Bld 5.4 4.8 - 5.6 %    Comment: (NOTE) Pre diabetes:          5.7%-6.4% Diabetes:              >6.4% Glycemic control for   <7.0% adults with diabetes    Mean Plasma Glucose 108.28 mg/dL    Comment: Performed at [facility], [place] [address] [place], [place] [address]  Lipid panel     Status: Abnormal   Collection Time: 07/16/18  6:50 AM  Result Value Ref Range   Cholesterol 260 (H) 0 - 200 mg/dL   Triglycerides 241 (H) <150 mg/dL   HDL 59 >40 mg/dL   Total CHOL/HDL Ratio 4.4 RATIO   VLDL 48 (H) 0 - 40 mg/dL   LDL Cholesterol 153 (H) 0 - 99 mg/dL    Comment:        Total Cholesterol/HDL:CHD Risk Coronary Heart Disease Risk Table                     Men   Women  1/2 Average Risk   3.4   3.3  Average Risk       5.0   4.4  2 X Average Risk   9.6   7.1  3 X Average Risk  23.4   11.0        Use the calculated Patient Ratio above and the CHD Risk Table to determine the patient's CHD Risk.        ATP III CLASSIFICATION (LDL):  <100     mg/dL   Optimal  100-129  mg/dL   Near or Above  Optimal  130-159  mg/dL   Borderline  160-189  mg/dL   High  >190     mg/dL   Very High Performed at [facility] [address] [place], [place] [address]     Blood Alcohol level:  Lab Results  Component Value Date   ETH 19 (H) 07/06/2018   ETH <10 90/24/0973    Metabolic Disorder Labs: Lab Results  Component Value Date   HGBA1C 5.4 07/16/2018   MPG 108.28 07/16/2018   MPG 100 04/09/2012   Lab Results  Component Value Date   PROLACTIN 21.0 (H) 06/21/2016   Lab Results  Component Value Date   CHOL 260 (H) 07/16/2018   TRIG 241 (H) 07/16/2018   HDL 59 07/16/2018   CHOLHDL 4.4 07/16/2018    VLDL 48 (H) 07/16/2018   LDLCALC 153 (H) 07/16/2018   LDLCALC 84 11/07/2017    Physical Findings: AIMS: Facial and Oral Movements Muscles of Facial Expression: None, normal Lips and Perioral Area: None, normal Jaw: None, normal Tongue: None, normal,Extremity Movements Upper (arms, wrists, hands, fingers): None, normal Lower (legs, knees, ankles, toes): None, normal, Trunk Movements Neck, shoulders, hips: None, normal, Overall Severity Severity of abnormal movements (highest score from questions above): None, normal Incapacitation due to abnormal movements: None, normal Patient's awareness of abnormal movements (rate only patient's report): No Awareness, Dental Status Current problems with teeth and/or dentures?: No Does patient usually wear dentures?: No  CIWA:  CIWA-Ar Total: 1 COWS:  COWS Total Score: 2  Musculoskeletal: Strength & Muscle Tone: within normal limits Gait & Station: normal Patient leans: N/A  Psychiatric Specialty Exam: Physical Exam  Nursing note and vitals reviewed. Constitutional: He appears well-developed and well-nourished.  HENT:  Head: Normocephalic and atraumatic.  Respiratory: Effort normal.    ROS no chest pain, no shortness of breath, no vomiting  Blood pressure 117/85, pulse 82, temperature (!) 97.4 F (36.3 C), temperature source Oral, resp. rate 18, height 5' 10"  (1.778 m), weight 78.9 kg (174 lb), SpO2 98 %.Body mass index is 24.97 kg/m.  General Appearance: Fairly Groomed  Eye Contact:  Good  Speech:  Normal Rate  Volume:  Normal  Mood:  Partially improved mood, remains anxious  Affect:  Congruent, vaguely anxious , becoming more reactive  Thought Process:  Linear and Descriptions of Associations: Intact  Orientation:  Other:  fully alert and attentive  Thought Content:  no hallucinations, no delusions, focused on stressors  Suicidal Thoughts:  No-today denies suicidal ideations, denies self-injurious ideations at this time   Homicidal Thoughts: No current HI  Memory:  recent and remote grossly intact   Judgement: Improving  Insight:  Fair  Psychomotor Activity:  Normal  Concentration:  Concentration: Good and Attention Span: Good  Recall:  Good  Fund of Knowledge:  Good  Language:  Good  Akathisia:  Yes  Handed:  Right  AIMS (if indicated):     Assets:  Desire for Improvement Resilience Talents/Skills  ADL's:  Intact  Cognition:  WNL  Sleep:  Number of Hours: 6.75    Assessment -patient presents with partially improved mood and range of affect.  He remains anxious, and in particular ruminative about where he will go after discharge as he is currently homeless.  He has reported intermittent suicidal ideations, but does not endorse at this time and presents future oriented at present , better able to discuss tangible disposition plans, with thoughts of either going to being to stay with his brother or  possibly going to an [employer].  Today denies medication side effects and does not report dizziness or lightheadedness, gait is steady.     Treatment Plan Summary: Treatment Plan reviewed as below today 7/24 Encourage group/milieu participation to work on Radiographer, therapeutic and symptom reduction Encourage ongoing efforts to work on sobriety and relapse prevention Treatment team working on disposition planning as above  Continue Minipress to 2 mgrs QHS for nightmares ( dose decrease to minimize side effects) Decrease Trazodone to 150 mgrs QHS for insomnia Continue Ability 20 mgrs QDAY for mood disorder  Continue Wellbutrin XL 300 mgrs QDAY for depression Continue Norvasc and Lisinopril for HTN Continue PPI ( Protonix) for GERD symptoms   Jenne Campus, MD 07/16/2018, 1:03 PM   Patient ID: Sandria Manly, male   DOB: [birth year], 51 y.o.   MRN: [id number]

## 2018-07-17 MED ORDER — LISINOPRIL 10 MG PO TABS: 10.0000 mg | ORAL_TABLET | Freq: Every day | ORAL | 0 refills | Status: DC

## 2018-07-17 MED ORDER — PANTOPRAZOLE SODIUM 40 MG PO TBEC: 40.0000 mg | DELAYED_RELEASE_TABLET | Freq: Every day | ORAL | 0 refills | Status: DC

## 2018-07-17 MED ORDER — MIRTAZAPINE 7.5 MG PO TABS: 7.5000 mg | ORAL_TABLET | Freq: Every day | ORAL | 0 refills | Status: DC

## 2018-07-17 MED ORDER — NICOTINE 21 MG/24HR TD PT24: 21.0000 mg | MEDICATED_PATCH | Freq: Every day | TRANSDERMAL | 0 refills | Status: DC

## 2018-07-17 MED ORDER — ARIPIPRAZOLE 20 MG PO TABS: 20.0000 mg | ORAL_TABLET | Freq: Every day | ORAL | 0 refills | Status: DC

## 2018-07-17 MED ORDER — HYDROXYZINE HCL 50 MG PO TABS: 50.0000 mg | ORAL_TABLET | Freq: Three times a day (TID) | ORAL | 0 refills | Status: DC | PRN

## 2018-07-17 MED ORDER — TRAZODONE HCL 150 MG PO TABS: 150.0000 mg | ORAL_TABLET | Freq: Every evening | ORAL | 0 refills | Status: DC | PRN

## 2018-07-17 MED ORDER — GABAPENTIN 300 MG PO CAPS: 300.0000 mg | ORAL_CAPSULE | Freq: Three times a day (TID) | ORAL | 0 refills | Status: DC

## 2018-07-17 MED ORDER — PRAZOSIN HCL 2 MG PO CAPS: 2.0000 mg | ORAL_CAPSULE | Freq: Every day | ORAL | 0 refills | Status: DC

## 2018-07-17 MED ORDER — AMLODIPINE BESYLATE 5 MG PO TABS: 5.0000 mg | ORAL_TABLET | Freq: Every day | ORAL | 0 refills | Status: DC

## 2018-07-17 MED ORDER — BUPROPION HCL ER (XL) 300 MG PO TB24
300.0000 mg | ORAL_TABLET | Freq: Every day | ORAL | 0 refills | Status: DC
Start: 2018-07-17 — End: 2018-09-15

## 2018-07-17 NOTE — Progress Notes (Signed)
CSW spoke with pt again regarding discharge.  Pt talked to his old landlord who may have a room for him in [place] starting this weekend.  Pt now going back and forth between going to [place] or staying in [place].  We discussed [facility] as mental health provider in [place].  Pt finally said he is going to go to [place], speak with brother face to face, and will return to his boarding house in [place] if this is not going to work. Garner NashGregory Miamor Ayler, MSW, LCSW Clinical Social Worker 07/17/2018 10:34 AM

## 2018-07-17 NOTE — Plan of Care (Signed)
  Problem: Education: Goal: Knowledge of [facility] Education information/materials will improve Outcome: Completed/Met Goal: Emotional status will improve Outcome: Completed/Met Goal: Mental status will improve Outcome: Completed/Met Goal: Verbalization of understanding the information provided will improve Outcome: Completed/Met   Problem: Activity: Goal: Interest or engagement in activities will improve Outcome: Completed/Met Goal: Sleeping patterns will improve Outcome: Completed/Met   Problem: Coping: Goal: Ability to verbalize frustrations and anger appropriately will improve Outcome: Completed/Met Goal: Ability to demonstrate self-control will improve Outcome: Completed/Met   Problem: Health Behavior/Discharge Planning: Goal: Identification of resources available to assist in meeting health care needs will improve Outcome: Completed/Met Goal: Compliance with treatment plan for underlying cause of condition will improve Outcome: Completed/Met   Problem: Physical Regulation: Goal: Ability to maintain clinical measurements within normal limits will improve Outcome: Completed/Met   Problem: Safety: Goal: Periods of time without injury will increase Outcome: Completed/Met   Problem: Self-Concept: Goal: Ability to disclose and discuss suicidal ideas will improve Outcome: Completed/Met Goal: Will verbalize positive feelings about self Outcome: Completed/Met   Problem: Health Behavior/Discharge Planning: Goal: Ability to implement measures to prevent violent behavior in the future will improve Outcome: Completed/Met   Problem: Safety: Goal: Ability to demonstrate self-control will improve Outcome: Completed/Met Goal: Ability to redirect hostility and anger into socially appropriate behaviors will improve Outcome: Completed/Met

## 2018-07-17 NOTE — Progress Notes (Addendum)
Discharge note:  Patient discharged per MD order.  He received all personal belongings from locker and unit.  Reviewed AVS/transition record with patient and he indicates understanding.  Patient wrote on his self inventory that he is still having suicidal ideation.  He rates his depression as a 7; hopelessness as a 9; anxiety as an 8.  His goal for today is to "establish a place to go."  Patient left ambulatory with bus ticket for transportation.  Patient is taking the [employer] bus to [place], [place].  He verbally denied any suicidal ideation to this nurse upon discharge.

## 2018-07-17 NOTE — Progress Notes (Signed)
  [facility] Adult Case Management Discharge Plan :  Will you be returning to the same living situation after discharge:  No. Pt will either go to [place] or rooming house in [place]. At discharge, do you have transportation home?: No. Bus tickets provided.  [employer] and [employer]. Do you have the ability to pay for your medications: No. Will work with [facility].  Release of information consent forms completed and in the chart;  Patient's signature needed at discharge.  Patient to Follow up at: Follow-up Information    [facility]. Go on 07/21/2018.   Why:  Please attend your intake appt on Monday, 07/21/18, at 1:00pm. Contact information: [address] [place], [place] [address] P: [phone] F: [phone]       [facility] [place] Follow up.   Why:  Please access supportive services like meals, showers, and laundry at this location as necessary. Contact information: [address] [place], [place] [address] P: [phone]       [facility] Care Coordinator: Delilah. Call.   Why:  Please contact Delilah, your care coordinator, for assistance with services. Contact information: [phone]          Next level of care provider has access to [facility] Link:no  Safety Planning and Suicide Prevention discussed: No.  Have you used any form of tobacco in the last 30 days? (Cigarettes, Smokeless Tobacco, Cigars, and/or Pipes): Yes  Has patient been referred to the Quitline?: Patient refused referral  Patient has been referred for addiction treatment: Yes  Lorri FrederickWierda, Boysie Jon, LCSW 07/17/2018, 10:30 AM

## 2018-07-17 NOTE — Progress Notes (Signed)
Pt attended goals and orientation group this morning. Pt participated and was active.

## 2018-07-17 NOTE — Discharge Summary (Addendum)
Physician Discharge Summary Note  Patient:  Chase Moss is an 52 y.o., male  MRN:  [id number]  DOB:  [birth year]  Patient phone:  [phone] (home)   Patient address:   [address] [place] [address],   Total Time spent with patient: Greater than 30 minutes  Date of Admission:  07/07/2018  Date of Discharge: 07-17-18  Reason for Admission: Worsening symptoms of depression & suicidal thoughts.  Principal Problem: Bipolar 1 disorder, depressed [facility])  Discharge Diagnoses: Patient Active Problem List   Diagnosis Date Noted  . Bipolar I disorder (HCC) [F31.9] 02/11/2018    Priority: High  . PTSD (post-traumatic stress disorder) [F43.10]     Priority: Medium  . Bipolar 1 disorder, depressed (HCC) [F31.9] 07/07/2018  . Homicidal ideation [R45.850]   . Alcohol abuse with alcohol-induced mood disorder (HCC) [F10.14] 03/29/2018  . Cannabis use disorder, mild, abuse [F12.10] 02/12/2018  . MDD (major depressive disorder) [F32.9] 12/11/2017  . Intentional overdose of drug in tablet form (HCC) [T50.902A] 11/08/2017  . Dyslipidemia [E78.5] 11/08/2017  . Constipation [K59.00] 11/08/2017  . Drug overdose [T50.901A] 11/05/2017  . Acute alcoholic intoxication without complication (HCC) [F10.920]   . Suicidal ideation [R45.851]   . Alcohol intoxication (HCC) [F10.929] 10/22/2017  . Alcohol withdrawal (HCC) [F10.239] 10/22/2017  . Polysubstance abuse (HCC) [F19.10]   . 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 Cocaine abuse with cocaine-induced mood disorder (HCC) [F14.14] 04/01/2017  . HTN (hypertension) [I10] 06/20/2016  . Tobacco use disorder [F17.200] 06/20/2016  . Trauma [T14.90XA] 07/27/2015  . Cannabis use disorder, moderate, dependence (HCC) [F12.20]   . Substance induced mood disorder (HCC) [F19.94] 07/07/2014  . Alcohol use disorder, moderate, dependence (HCC) [F10.20] 04/10/2012  . Suicidal thoughts [R45.851]  04/04/2012   Past Psychiatric History: Hx. Polysubstance dependence, PTSD, MDD  Past Medical History:  Past Medical History:  Diagnosis Date  . Bipolar 1 disorder (HCC)   . Depression   . Hypertension   . PTSD (post-traumatic stress disorder)    History reviewed. No pertinent surgical history. Family History:  Family History  Problem Relation Age of Onset  . Heart attack Other    Family Psychiatric  History: See H&P  Social History:  Social History   Substance and Sexual Activity  Alcohol Use Yes  . Alcohol/week: 1.2 oz  . Types: 2 Cans of beer per week   Comment: Pt states "I've only drank (1) 40 in 3 months"      Social History   Substance and Sexual Activity  Drug Use Yes  . Types: Cocaine, Marijuana    Social History   Socioeconomic History  . Marital status: Divorced    Spouse name: Not on file  . Number of children: Not on file  . Years of education: Not on file  . Highest education level: Not on file  Occupational History  . Not on file  Social Needs  . Financial resource strain: Not on file  . Food insecurity:    Worry: Not on file    Inability: Not on file  . Transportation needs:    Medical: Not on file    Non-medical: Not on file  Tobacco Use  . Smoking status: Current Every Day Smoker    Packs/day: 1.00    Years: 30.00    Pack years: 30.00    Types: Cigarettes  . Smokeless tobacco: Never Used  Substance and Sexual Activity  . Alcohol use: Yes    Alcohol/week: 1.2  oz    Types: 2 Cans of beer per week    Comment: Pt states "I've only drank (1) 40 in 3 months"   . Drug use: Yes    Types: Cocaine, Marijuana  . Sexual activity: Yes  Lifestyle  . Physical activity:    Days per week: Not on file    Minutes per session: Not on file  . Stress: Not on file  Relationships  . Social connections:    Talks on phone: Not on file    Gets together: Not on file    Attends religious service: Not on file    Active member of club or organization:  Not on file    Attends meetings of clubs or organizations: Not on file    Relationship status: Not on file  Other Topics Concern  . Not on file  Social History Narrative  . Not on file   Hospital Course: (Per Md's Admission notes): Chase GoodnightGregory Moss a 52 y.o.[facility]ewho [facility] due to thoughts of wanting to kill himself last night. Pt shared he had been in a program through [employer] but that it was shut down and he hasn't been taking his medication since that time (June 1). Pt shared he has been diagnosed with bipolar disorder and with PTSD. Pt stated he has been having re-occurring nightmares and that his mother died in January 2018 and that his sister died in March 2018, leaving him with no supports. Pt stated that, after the program shut down, he moved into a place with a male peer but that, after he paid the man rent, the man kicked him out last night and called the police on pt for him to leave. Pt stated the police told him it was a [employer] matter  Pt stated that he has had continuing SI since yesterday; he states his plan is to "have the police hurt [him]." He shared that the last time he attempted to kill himself was in 02/2017 when his sister died.  After the admission assessment, it was determined based on his symptoms that Chase LitesGregory will need mood stabilization treatments. And with his consent, the medication regimen targeting those presenting symptoms were dicussed with him & initiated. He was medicated & discharged on; Abilify 20 mg for mood control, Wellbutrin XL 300 mg for depression,  Hydroxyzine 50 mg prn for anxiety, Mirtazapine 7.5 mg for insomnia, Trazodone 150 mg prn for insomnia, Gabapentin 300 mg for agitation , Nicotine patch 21 mg for smoking cessation & Prazosin 2 mg for PTSD related nightmares. He was also enrolled in the group counseling sessions being offered & held on this unit. He learned coping skills. He also received other medication regimen for the other pre-existing  medical issues presented. He tolerated his treatment regimen without any adverse effects or reactions reported.  Chase Moss's symptoms responded well to his treatment regimen. This is evidenced by his reports of improved mood, presentation of good affect, absence of suicidal/homicidal ideations & or substance withdrawal symptoms. He  is seen by his attending psychiatrist today. He is pleased that he sought help. Says he is tolerating his medications well. No withdrawal symptoms. No craving for alcohol or other drugs. He is no longer feeling depressed. Describes normal energy and ability to think. Able to focus on task. No suicidal thoughts. No homicidal thoughts. No thoughts of violence. Patient reports normal biological functions.   The nursing staff reports that patient has been appropriate on the unit. Patient has been interacting well with  peers & staff. No behavioral issues. Patient has not been observed to be internally stimulated or pre-occupied. Patient has been adherent to his treatment recommendations. Patient has been tolerating his medication well, denies any adverse reactions or side effects. Jakell's case was discussed at the team meeting this morning. The team members feel that patient is back to hisbaseline level of function. The team agrees with plan to discharge patient today to continue mental health care & substance abuse treatment at the [facility] treatment center.   Upon discharge, Madden adamantly denies any SIHI, AVH, delusional thoughts, paranoia or substance withdrawal symptoms. He will continue further psychiatric follow-up care/medication management on an outpatient basis as noted below. He was provided with all the necessary information needed to make this appointment without problems. He left [facility] with all personal belongings in no apparent distress.  Physical Findings: AIMS: Facial and Oral Movements Muscles of Facial Expression: None, normal Lips and Perioral Area: None,  normal Jaw: None, normal Tongue: None, normal,Extremity Movements Upper (arms, wrists, hands, fingers): None, normal Lower (legs, knees, ankles, toes): None, normal, Trunk Movements Neck, shoulders, hips: None, normal, Overall Severity Severity of abnormal movements (highest score from questions above): None, normal Incapacitation due to abnormal movements: None, normal Patient's awareness of abnormal movements (rate only patient's report): No Awareness, Dental Status Current problems with teeth and/or dentures?: No Does patient usually wear dentures?: No  CIWA:  CIWA-Ar Total: 1 COWS:  COWS Total Score: 2  Musculoskeletal: Strength & Muscle Tone: within normal limits Gait & Station: normal Patient leans: N/A  Psychiatric Specialty Exam: Physical Exam  Constitutional: He appears well-developed.  HENT:  Head: Normocephalic.  Eyes: Pupils are equal, round, and reactive to light.  Neck: Normal range of motion.  Cardiovascular: Normal rate.  Respiratory: Effort normal.  GI: Soft.  Genitourinary:  Genitourinary Comments: Deferred  Musculoskeletal: Normal range of motion.  Neurological: He is alert.  Skin: Skin is warm.    Review of Systems  Constitutional: Negative.   HENT: Negative.   Eyes: Negative.   Respiratory: Negative.   Cardiovascular: Negative.   Gastrointestinal: Negative.   Genitourinary: Negative.   Musculoskeletal: Negative.   Skin: Negative.   Endo/Heme/Allergies: Negative.   Psychiatric/Behavioral: Positive for depression (Stable) and substance abuse (Hx. polysubstance dependence). Negative for hallucinations, memory loss and suicidal ideas. The patient has insomnia (Stable). The patient is not nervous/anxious.     Blood pressure 118/83, pulse 89, temperature 98.2 F (36.8 C), temperature source Oral, resp. rate 16, height 5\' 10"  (1.778 m), weight 78.9 kg (174 lb), SpO2 98 %.Body mass index is 24.97 kg/m.  See Md's SRA   Have you used any form of tobacco  in the last 30 days? (Cigarettes, Smokeless Tobacco, Cigars, and/or Pipes): Yes  Has this patient used any form of tobacco in the last 30 days? (Cigarettes, Smokeless Tobacco, Cigars, and/or Pipes):  No  Blood Alcohol level:  Lab Results  Component Value Date   ETH 19 (H) 07/06/2018   ETH <10 05/01/2018   Metabolic Disorder Labs:  Lab Results  Component Value Date   HGBA1C 5.4 07/16/2018   MPG 108.28 07/16/2018   MPG 100 04/09/2012   Lab Results  Component Value Date   PROLACTIN 21.0 (H) 06/21/2016   Lab Results  Component Value Date   CHOL 260 (H) 07/16/2018   TRIG 241 (H) 07/16/2018   HDL 59 07/16/2018   CHOLHDL 4.4 07/16/2018   VLDL 48 (H) 07/16/2018   LDLCALC 153 (H) 07/16/2018  LDLCALC 84 11/07/2017   See Psychiatric Specialty Exam and Suicide Risk Assessment completed by Attending Physician prior to discharge.  Discharge destination:  Home  Is patient on multiple antipsychotic therapies at discharge:  No   Has Patient had three or more failed trials of antipsychotic monotherapy by history:  No  Recommended Plan for Multiple Antipsychotic Therapies: NA  Allergies as of 07/17/2018   No Known Allergies     Medication List    TAKE these medications     Indication  amLODipine 5 MG tablet Commonly known as:  NORVASC Take 1 tablet (5 mg total) by mouth daily. For high blood pressure  Indication:  High Blood Pressure Disorder   ARIPiprazole 20 MG tablet Commonly known as:  ABILIFY Take 1 tablet (20 mg total) by mouth daily. For mood control What changed:    medication strength  how much to take  Indication:  Mood control   buPROPion 300 MG 24 hr tablet Commonly known as:  WELLBUTRIN XL Take 1 tablet (300 mg total) by mouth daily. For depression What changed:  additional instructions  Indication:  Major Depressive Disorder   gabapentin 300 MG capsule Commonly known as:  NEURONTIN Take 1 capsule (300 mg total) by mouth 3 (three) times daily. For  agitation What changed:  additional instructions  Indication:  Abuse or Misuse of Alcohol   hydrOXYzine 50 MG tablet Commonly known as:  ATARAX/VISTARIL Take 1 tablet (50 mg total) by mouth 3 (three) times daily as needed for anxiety. What changed:    how much to take  how to take this  when to take this  reasons to take this  additional instructions  Indication:  Feeling Anxious, Insomnia   lisinopril 10 MG tablet Commonly known as:  PRINIVIL,ZESTRIL Take 1 tablet (10 mg total) by mouth daily. For high blood pressure  Indication:  High Blood Pressure Disorder   mirtazapine 7.5 MG tablet Commonly known as:  REMERON Take 1 tablet (7.5 mg total) by mouth at bedtime. For sleep What changed:    medication strength  how much to take  additional instructions  Indication:  Insomnia   nicotine 21 mg/24hr patch Commonly known as:  NICODERM CQ - dosed in mg/24 hours Place 1 patch (21 mg total) onto the skin daily. (May buy from over the counter): For smoking cessation Start taking on:  07/18/2018  Indication:  Nicotine Addiction   pantoprazole 40 MG tablet Commonly known as:  PROTONIX Take 1 tablet (40 mg total) by mouth daily. For acid reflux Start taking on:  07/18/2018  Indication:  Gastroesophageal Reflux Disease   prazosin 2 MG capsule Commonly known as:  MINIPRESS Take 1 capsule (2 mg total) by mouth at bedtime. For PTSD nightmares  Indication:  PTSD related nightmares   traZODone 150 MG tablet Commonly known as:  DESYREL Take 1 tablet (150 mg total) by mouth at bedtime as needed for sleep. For sleep  Indication:  Trouble Sleeping      Follow-up Information    [facility]. Go on 07/21/2018.   Why:  Please attend your intake appt on Monday, 07/21/18, at 1:00pm. Contact information: [address] [place], [place] [address] P: [phone] F: [phone]       [facility] Follow up.   Why:  Please access supportive  services like meals, showers, and laundry at this location as necessary. Contact information: [address] [place], [place] [address] P: [phone]  [facility] Care Coordinator: Delilah. Call.   Why:  Please contact Delilah, your care coordinator, for assistance with services. Contact information: [phone]       [facility] Follow up.   Specialty:  Behavioral Health Why:  If needed, attend walk in appt Monday-Friday.  Please arrive between 8am-10am. Contact information: [address] [place] [address] [phone]          Follow-up recommendations: Activity:  As tolerated Diet: As recommended by your primary care doctor. Keep all scheduled follow-up appointments as recommended.    Comments: Patient is instructed prior to discharge to: Take all medications as prescribed by his/her mental healthcare provider. Report any adverse effects and or reactions from the medicines to his/her outpatient provider promptly. Patient has been instructed & cautioned: To not engage in alcohol and or illegal drug use while on prescription medicines. In the event of worsening symptoms, patient is instructed to call the crisis hotline, 911 and or go to the nearest ED for appropriate evaluation and treatment of symptoms. To follow-up with his/her primary care provider for your other medical issues, concerns and or health care needs.   Signed: Armandina Stammer, NP, PMHNP, FNP-BC 07/17/2018, 1:46 PM  Patient seen, Suicide Assessment Completed.  Disposition Plan Reviewed

## 2018-07-17 NOTE — [facility] Suicide Risk Assessment (Addendum)
[facility] Discharge Suicide Risk Assessment   Principal Problem: depression Discharge Diagnoses:  Patient Active Problem List   Diagnosis Date Noted  . Bipolar 1 disorder, depressed (HCC) [F31.9] 07/07/2018  . Homicidal ideation [R45.850]   . Alcohol abuse with alcohol-induced mood disorder (HCC) [F10.14] 03/29/2018  . Cannabis use disorder, mild, abuse [F12.10] 02/12/2018  . Bipolar I disorder (HCC) [F31.9] 02/11/2018  . MDD (major depressive disorder) [F32.9] 12/11/2017  . Intentional overdose of drug in tablet form (HCC) [T50.902A] 11/08/2017  . Dyslipidemia [E78.5] 11/08/2017  . Constipation [K59.00] 11/08/2017  . Drug overdose [T50.901A] 11/05/2017  . Acute alcoholic intoxication without complication (HCC) [F10.920]   . Suicidal ideation [R45.851]   . Alcohol intoxication (HCC) [F10.929] 10/22/2017  . Alcohol withdrawal (HCC) [F10.239] 10/22/2017  . Polysubstance abuse (HCC) [F19.10]   . 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 Cocaine abuse with cocaine-induced mood disorder (HCC) [F14.14] 04/01/2017  . HTN (hypertension) [I10] 06/20/2016  . Tobacco use disorder [F17.200] 06/20/2016  . Trauma [T14.90XA] 07/27/2015  . Cannabis use disorder, moderate, dependence (HCC) [F12.20]   . PTSD (post-traumatic stress disorder) [F43.10]   . Substance induced mood disorder (HCC) [F19.94] 07/07/2014  . Alcohol use disorder, moderate, dependence (HCC) [F10.20] 04/10/2012  . Suicidal thoughts [R45.851] 04/04/2012    Total Time spent with patient: 30 minutes  Musculoskeletal: Strength & Muscle Tone: within normal limits Gait & Station: normal Patient leans: N/A  Psychiatric Specialty Exam: ROS denies headache, no chest pain, no shortness of breath, no vomiting, no nausea, no fever, no chills  Blood pressure 118/83, pulse 89, temperature 98.2 F (36.8 C), temperature source Oral, resp. rate 16, height 5\' 10"  (1.778 m), weight 78.9 kg  (174 lb), SpO2 98 %.Body mass index is 24.97 kg/m.  General Appearance: improving grooming   Eye Contact::  Good  Speech:  Normal Rate409  Volume:  Normal  Mood:  reports improving mood'  Affect:  more reactive, anxious   Thought Process:  Linear and Descriptions of Associations: Intact  Orientation:  Full (Time, Place, and Person)  Thought Content:  no hallucinations, no delusions, not internally preoccupied   Suicidal Thoughts:  No denies current suicidal or self injurious ideations, denies homicidal or violent ideations   Homicidal Thoughts:  No  Memory:  recent and remote grossly intact   Judgement:  Fair- improving   Insight:  improving   Psychomotor Activity:  Normal  Concentration:  Good  Recall:  Good  Fund of Knowledge:Good  Language: Good  Akathisia:  Negative  Handed:  Right  AIMS (if indicated):     Assets:  Desire for Improvement Resilience  Sleep:  Number of Hours: 6.75  Cognition: WNL  ADL's:  Intact   Mental Status Per Nursing Assessment::   On Admission:  Suicidal ideation indicated by patient  Demographic Factors:  52 year old male , divorced, has an adult daughter, homeless   Loss Factors: Homelessness, had been living in rehab program that closed .   Historical Factors: History of depression, history of Bipolar Disorder and PTSD , history of substance abuse ( cannabis, cocaine, alcohol)   Risk Reduction Factors:   Positive coping skills or problem solving skills  Continued Clinical Symptoms:  At this time patient is alert, attentive, well groomed, mood partially improved, acknowledges he feels better, affect remains anxious, no thought disorder, today denies suicidal or self injurious ideations, denies homicidal or violent ideations,and presents future oriented, states [place] with his brother, who is supportive, but is unsure if brother will actually let him stay there. He plans [place] access shelter in [place] . [place]  [place] as his previous landlord has told him he can return as of this weekend , and states " I'll be OK on the first of the month when I get a check". Denies medication side effects. No disruptive or overtly agitated behaviors on unit. Medication side effects discussed .  Cognitive Features That Contribute To Risk:  No gross cognitive deficits noted upon discharge. Is alert , attentive, and oriented x 3   Suicide Risk:  Mild:  Suicidal ideation of limited frequency, intensity, duration, and specificity.  There are no identifiable plans, no associated intent, mild dysphoria and related symptoms, good self-control (both objective and subjective assessment), few other risk factors, and identifiable protective factors, including available and accessible social support.  Follow-up Information    [facility]. Go on 07/21/2018.   Why:  Please attend your intake appt on Monday, 07/21/18, at 1:00pm. Contact information: [address] [place], [place] [address] P: [phone] F: [phone]       [facility] [place] Follow up.   Why:  Please access supportive services like meals, showers, and laundry at this location as necessary. Contact information: [address] [place], [place] [address] P: [phone]       [facility] Care Coordinator: Delilah. Call.   Why:  Please contact Delilah, your care coordinator, for assistance with services. Contact information: [phone]          Plan Of Care/Follow-up recommendations:  Activity:  as tolerated  Diet:  heart healthy Tests:  NA Other:  See below  Patient is discharging with plan highlighted above  He has a PCP at [facility] for medical management as needed   Craige CottaFernando A Rawleigh Rode, MD 07/17/2018, 10:16 AM

## 2018-09-09 ENCOUNTER — Encounter (HOSPITAL_COMMUNITY): Payer: Self-pay | Admitting: Emergency Medicine

## 2018-09-09 ENCOUNTER — Emergency Department (HOSPITAL_COMMUNITY)
Admission: EM | Admit: 2018-09-09 | Discharge: 2018-09-10 | Disposition: A | Discharge status: Other Acute Inpatient Hospital | Payer: Medicaid Other | Attending: Emergency Medicine | Admitting: Emergency Medicine

## 2018-09-09 ENCOUNTER — Other Ambulatory Visit: Payer: Self-pay

## 2018-09-09 DIAGNOSIS — I10 Essential (primary) hypertension: Secondary | ICD-10-CM | POA: Diagnosis not present

## 2018-09-09 DIAGNOSIS — S61512A Laceration without foreign body of left wrist, initial encounter: Secondary | ICD-10-CM | POA: Diagnosis not present

## 2018-09-09 DIAGNOSIS — X781XXA Intentional self-harm by knife, initial encounter: Secondary | ICD-10-CM | POA: Diagnosis not present

## 2018-09-09 DIAGNOSIS — R45850 Homicidal ideations: Secondary | ICD-10-CM | POA: Diagnosis not present

## 2018-09-09 DIAGNOSIS — F332 Major depressive disorder, recurrent severe without psychotic features: Secondary | ICD-10-CM | POA: Insufficient documentation

## 2018-09-09 DIAGNOSIS — Z23 Encounter for immunization: Secondary | ICD-10-CM | POA: Diagnosis not present

## 2018-09-09 DIAGNOSIS — Y999 Unspecified external cause status: Secondary | ICD-10-CM | POA: Diagnosis not present

## 2018-09-09 DIAGNOSIS — Z79899 Other long term (current) drug therapy: Secondary | ICD-10-CM | POA: Insufficient documentation

## 2018-09-09 DIAGNOSIS — F329 Major depressive disorder, single episode, unspecified: Secondary | ICD-10-CM

## 2018-09-09 DIAGNOSIS — R45851 Suicidal ideations: Secondary | ICD-10-CM | POA: Diagnosis not present

## 2018-09-09 DIAGNOSIS — Y929 Unspecified place or not applicable: Secondary | ICD-10-CM | POA: Diagnosis not present

## 2018-09-09 DIAGNOSIS — Y9389 Activity, other specified: Secondary | ICD-10-CM | POA: Insufficient documentation

## 2018-09-09 DIAGNOSIS — F17210 Nicotine dependence, cigarettes, uncomplicated: Secondary | ICD-10-CM | POA: Insufficient documentation

## 2018-09-09 DIAGNOSIS — F32A Depression, unspecified: Secondary | ICD-10-CM

## 2018-09-09 LAB — SALICYLATE LEVEL: Salicylate Lvl: <7 mg/dL (ref 2.8–30.0)

## 2018-09-09 LAB — CBC
HCT: 51.3 % (ref 39.0–52.0)
Hemoglobin: 17.2 g/dL — ABNORMAL HIGH (ref 13.0–17.0)
MCH: 29.5 pg (ref 26.0–34.0)
MCHC: 33.5 g/dL (ref 30.0–36.0)
MCV: 87.8 fL (ref 78.0–100.0)
Platelets: 216 10*3/uL (ref 150–400)
RBC: 5.84 MIL/uL — ABNORMAL HIGH (ref 4.22–5.81)
RDW: 13.3 % (ref 11.5–15.5)
WBC: 8.5 10*3/uL (ref 4.0–10.5)

## 2018-09-09 LAB — COMPREHENSIVE METABOLIC PANEL
ALT: 16 U/L (ref 0–44)
AST: 19 U/L (ref 15–41)
Albumin: 4.4 g/dL (ref 3.5–5.0)
Alkaline Phosphatase: 78 U/L (ref 38–126)
Anion gap: 15 (ref 5–15)
BUN: 14 mg/dL (ref 6–20)
CO2: 23 mmol/L (ref 22–32)
Calcium: 9.7 mg/dL (ref 8.9–10.3)
Chloride: 99 mmol/L (ref 98–111)
Creatinine, Ser: 1.35 mg/dL — ABNORMAL HIGH (ref 0.61–1.24)
GFR calc Af Amer: >60 mL/min (ref >60)
GFR calc non Af Amer: 58 mL/min — ABNORMAL LOW (ref >60)
Glucose, Bld: 103 mg/dL — ABNORMAL HIGH (ref 70–99)
Potassium: 3.5 mmol/L (ref 3.5–5.1)
Sodium: 137 mmol/L (ref 135–145)
Total Bilirubin: 1.7 mg/dL — ABNORMAL HIGH (ref 0.3–1.2)
Total Protein: 7.5 g/dL (ref 6.5–8.1)

## 2018-09-09 LAB — RAPID URINE DRUG SCREEN, HOSP PERFORMED
Amphetamines: NOT DETECTED
Barbiturates: NOT DETECTED
Benzodiazepines: NOT DETECTED
Cocaine: NOT DETECTED
Opiates: NOT DETECTED
Tetrahydrocannabinol: POSITIVE — AB

## 2018-09-09 LAB — ACETAMINOPHEN LEVEL: Acetaminophen (Tylenol), Serum: <10 ug/mL — ABNORMAL LOW (ref 10–30)

## 2018-09-09 LAB — ETHANOL: Alcohol, Ethyl (B): <10 mg/dL (ref <10)

## 2018-09-09 MED ORDER — LISINOPRIL 10 MG PO TABS
10.0000 mg | ORAL_TABLET | Freq: Every day | ORAL | Status: DC
  Administered 2018-09-09 – 2018-09-10 (×2): 10 mg via ORAL
  Filled 2018-09-09 (×2): qty 1

## 2018-09-09 MED ORDER — GABAPENTIN 300 MG PO CAPS
300.0000 mg | ORAL_CAPSULE | Freq: Three times a day (TID) | ORAL | Status: DC
  Administered 2018-09-09 – 2018-09-10 (×2): 300 mg via ORAL
  Filled 2018-09-09 (×3): qty 1

## 2018-09-09 MED ORDER — LORAZEPAM 1 MG PO TABS
1.0000 mg | ORAL_TABLET | Freq: Once | ORAL | Status: AC
  Administered 2018-09-09: 1 mg via ORAL
  Filled 2018-09-09: qty 1

## 2018-09-09 MED ORDER — TRAZODONE HCL 50 MG PO TABS: 150.0000 mg | ORAL_TABLET | Freq: Every evening | ORAL | Status: DC | PRN

## 2018-09-09 MED ORDER — ONDANSETRON 4 MG PO TBDP
4.0000 mg | ORAL_TABLET | Freq: Once | ORAL | Status: AC
  Administered 2018-09-09: 4 mg via ORAL
  Filled 2018-09-09: qty 1

## 2018-09-09 MED ORDER — FAMOTIDINE 20 MG PO TABS
20.0000 mg | ORAL_TABLET | Freq: Once | ORAL | Status: AC
  Administered 2018-09-09: 20 mg via ORAL
  Filled 2018-09-09: qty 1

## 2018-09-09 MED ORDER — BUPROPION HCL ER (XL) 150 MG PO TB24: 300.0000 mg | ORAL_TABLET | Freq: Every day | ORAL | Status: DC

## 2018-09-09 MED ORDER — LISINOPRIL 10 MG PO TABS: 10.0000 mg | ORAL_TABLET | Freq: Every day | ORAL | Status: DC

## 2018-09-09 MED ORDER — AMLODIPINE BESYLATE 5 MG PO TABS: 5.0000 mg | ORAL_TABLET | Freq: Every day | ORAL | Status: DC

## 2018-09-09 MED ORDER — NICOTINE 21 MG/24HR TD PT24: 21.0000 mg | MEDICATED_PATCH | Freq: Every day | TRANSDERMAL | Status: DC

## 2018-09-09 MED ORDER — PANTOPRAZOLE SODIUM 40 MG PO TBEC: 40.0000 mg | DELAYED_RELEASE_TABLET | Freq: Every day | ORAL | Status: DC

## 2018-09-09 MED ORDER — ARIPIPRAZOLE 10 MG PO TABS: 20.0000 mg | ORAL_TABLET | Freq: Every day | ORAL | Status: DC

## 2018-09-09 MED ORDER — TETANUS-DIPHTH-ACELL PERTUSSIS 5-2.5-18.5 LF-MCG/0.5 IM SUSP
0.5000 mL | Freq: Once | INTRAMUSCULAR | Status: AC
  Administered 2018-09-09: 0.5 mL via INTRAMUSCULAR
  Filled 2018-09-09: qty 0.5

## 2018-09-09 MED ORDER — PANTOPRAZOLE SODIUM 40 MG PO TBEC
40.0000 mg | DELAYED_RELEASE_TABLET | Freq: Every day | ORAL | Status: DC
  Administered 2018-09-10: 40 mg via ORAL
  Filled 2018-09-09 (×2): qty 1

## 2018-09-09 MED ORDER — ARIPIPRAZOLE 10 MG PO TABS
20.0000 mg | ORAL_TABLET | Freq: Every day | ORAL | Status: DC
  Administered 2018-09-09 – 2018-09-10 (×2): 20 mg via ORAL
  Filled 2018-09-09 (×2): qty 2

## 2018-09-09 MED ORDER — LIDOCAINE-EPINEPHRINE (PF) 2 %-1:200000 IJ SOLN
10.0000 mL | Freq: Once | INTRAMUSCULAR | Status: DC
  Filled 2018-09-09: qty 20

## 2018-09-09 MED ORDER — BACITRACIN ZINC 500 UNIT/GM EX OINT
TOPICAL_OINTMENT | Freq: Two times a day (BID) | CUTANEOUS | Status: DC
  Administered 2018-09-10: 1 via TOPICAL
  Filled 2018-09-09: qty 0.9

## 2018-09-09 MED ORDER — AMLODIPINE BESYLATE 5 MG PO TABS
5.0000 mg | ORAL_TABLET | Freq: Every day | ORAL | Status: DC
  Administered 2018-09-09 – 2018-09-10 (×2): 5 mg via ORAL
  Filled 2018-09-09 (×2): qty 1

## 2018-09-09 MED ORDER — PRAZOSIN HCL 2 MG PO CAPS
2.0000 mg | ORAL_CAPSULE | Freq: Every day | ORAL | Status: DC
  Administered 2018-09-09: 2 mg via ORAL
  Filled 2018-09-09: qty 1

## 2018-09-09 MED ORDER — MIRTAZAPINE 7.5 MG PO TABS
7.5000 mg | ORAL_TABLET | Freq: Every day | ORAL | Status: DC
  Administered 2018-09-09: 7.5 mg via ORAL
  Filled 2018-09-09: qty 1

## 2018-09-09 MED ORDER — LORAZEPAM 2 MG/ML IJ SOLN: 1.0000 mg | Freq: Once | INTRAMUSCULAR | Status: DC

## 2018-09-09 MED ORDER — HYDROXYZINE HCL 50 MG PO TABS: 50.0000 mg | ORAL_TABLET | Freq: Three times a day (TID) | ORAL | Status: DC | PRN

## 2018-09-09 MED ORDER — NICOTINE 21 MG/24HR TD PT24
21.0000 mg | MEDICATED_PATCH | Freq: Every day | TRANSDERMAL | Status: DC
  Filled 2018-09-09: qty 1

## 2018-09-09 MED ORDER — BUPROPION HCL ER (XL) 150 MG PO TB24
300.0000 mg | ORAL_TABLET | Freq: Every day | ORAL | Status: DC
  Administered 2018-09-09 – 2018-09-10 (×2): 300 mg via ORAL
  Filled 2018-09-09 (×2): qty 2
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
## 2018-09-09 NOTE — ED Notes (Signed)
Tech and [employer] present while pt changed into wine colored scrubs. Security also wanded pt.

## 2018-09-09 NOTE — ED Provider Notes (Signed)
..  Laceration Repair Date/Time: 09/09/2018 7:43 PM Performed by: Dartha LodgeFord, Kelsey N, PA-C Authorized by: Dartha LodgeFord, Kelsey N, PA-C   Consent:    Consent obtained:  Verbal   Consent given by:  Patient   Risks discussed:  Infection, need for additional repair, pain, poor cosmetic result and poor wound healing   Alternatives discussed:  No treatment Anesthesia (see MAR for exact dosages):    Anesthesia method:  Local infiltration   Local anesthetic:  Lidocaine 2% WITH epi Laceration details:    Location:  Shoulder/arm   Shoulder/arm location:  L lower arm   Length (cm):  4   Depth (mm):  5 Repair type:    Repair type:  Simple Pre-procedure details:    Preparation:  Patient was prepped and draped in usual sterile fashion Exploration:    Hemostasis achieved with:  Epinephrine   Wound exploration: wound explored through full range of motion and entire depth of wound probed and visualized     Wound extent: areolar tissue violated     Wound extent: no nerve damage noted, no tendon damage noted and no vascular damage noted   Treatment:    Area cleansed with:  Saline   Amount of cleaning:  Standard   Irrigation solution:  Sterile saline Skin repair:    Repair method:  Sutures   Suture size:  4-0   Suture material:  Prolene   Suture technique:  Simple interrupted   Number of sutures:  7 Approximation:    Approximation:  Close Post-procedure details:    Dressing:  Antibiotic ointment and sterile dressing   Patient tolerance of procedure:  Tolerated well, no immediate complications      Legrand RamsFord, Kelsey N, PA-C 09/09/18 1946    Raeford RazorKohut, Stephen, MD 09/18/18 1328

## 2018-09-09 NOTE — ED Triage Notes (Signed)
Pt BIB [facility], attempted to cut left wrist with a razor, bleeding controlled at this tie, normotensive. Pt reports SI and HI, sister recently passed away. Pt obviously anxious, unable to sit still, pacing the room. [employer] present.

## 2018-09-09 NOTE — ED Provider Notes (Signed)
[facility] EMERGENCY DEPARTMENT Provider Note   CSN: [id number] Arrival date & time: 09/09/18  1807     History   Chief Complaint Chief Complaint  Patient presents with  . Suicide Attempt  . Homicidal    HPI Chase Moss is a 52 y.o. male.  HPI   52 year old male with intentional laceration to the left wrist.  Happened shortly before arrival.  Patient cut himself with a razor blade.  He reports a past history of depression.  He says that he has been off his medication for approximately 2 months since he was discharged after psychiatric admission.  Reports increasing depression.  Denies any specific acute stressors.  His feelings of hopelessness is very anxious.  He says that he is very prone to impulsive behavior.  He reports occasional alcohol and marijuana use.  Denies any homicidal ideations or hallucinations.  He denies any acute ingestions.  Past Medical History:  Diagnosis Date  . Bipolar 1 disorder (HCC)   . Depression   . Hypertension   . PTSD (post-traumatic stress disorder)     Patient Active Problem List   Diagnosis Date Noted  . Bipolar 1 disorder, depressed (HCC) 07/07/2018  . Homicidal ideation   . Alcohol abuse with alcohol-induced mood disorder (HCC) 03/29/2018  . Cannabis use disorder, mild, abuse 02/12/2018  . Bipolar I disorder (HCC) 02/11/2018  . MDD (major depressive disorder) 12/11/2017  . Intentional overdose of drug in tablet form (HCC) 11/08/2017  . Dyslipidemia 11/08/2017  . Constipation 11/08/2017  . Drug overdose 11/05/2017  . Acute alcoholic intoxication without complication (HCC)   . Suicidal ideation   . Alcohol intoxication (HCC) 10/22/2017  . Alcohol withdrawal (HCC) 10/22/2017  . Polysubstance abuse (HCC)   . Major depressive disorder, recurrent, severe with psychotic features (HCC) 05/15/2017  . Cocaine abuse with cocaine-induced mood disorder (HCC) 04/01/2017  . HTN (hypertension) 06/20/2016  . Tobacco use  disorder 06/20/2016  . Trauma 07/27/2015  . Cannabis use disorder, moderate, dependence (HCC)   . PTSD (post-traumatic stress disorder)   . Substance induced mood disorder (HCC) 07/07/2014  . Alcohol use disorder, moderate, dependence (HCC) 04/10/2012  . Suicidal thoughts 04/04/2012    History reviewed. No pertinent surgical history.      Home Medications    Prior to Admission medications   Medication Sig Start Date End Date Taking? Authorizing Provider  amLODipine (NORVASC) 5 MG tablet Take 1 tablet (5 mg total) by mouth daily. For high blood pressure 07/17/18   Nwoko, Nicole Kindred I, NP  ARIPiprazole (ABILIFY) 20 MG tablet Take 1 tablet (20 mg total) by mouth daily. For mood control 07/17/18   Armandina Stammer I, NP  buPROPion (WELLBUTRIN XL) 300 MG 24 hr tablet Take 1 tablet (300 mg total) by mouth daily. For depression 07/17/18   Armandina Stammer I, NP  gabapentin (NEURONTIN) 300 MG capsule Take 1 capsule (300 mg total) by mouth 3 (three) times daily. For agitation 07/17/18   Armandina Stammer I, NP  hydrOXYzine (ATARAX/VISTARIL) 50 MG tablet Take 1 tablet (50 mg total) by mouth 3 (three) times daily as needed for anxiety. 07/17/18   Armandina Stammer I, NP  lisinopril (PRINIVIL,ZESTRIL) 10 MG tablet Take 1 tablet (10 mg total) by mouth daily. For high blood pressure 07/17/18   Nwoko, Nicole Kindred I, NP  mirtazapine (REMERON) 7.5 MG tablet Take 1 tablet (7.5 mg total) by mouth at bedtime. For sleep 07/17/18   Armandina Stammer I, NP  nicotine (NICODERM CQ - DOSED  IN MG/24 HOURS) 21 mg/24hr patch Place 1 patch (21 mg total) onto the skin daily. (May buy from over the counter): For smoking cessation 07/18/18   Armandina Stammer I, NP  pantoprazole (PROTONIX) 40 MG tablet Take 1 tablet (40 mg total) by mouth daily. For acid reflux 07/18/18   Armandina Stammer I, NP  prazosin (MINIPRESS) 2 MG capsule Take 1 capsule (2 mg total) by mouth at bedtime. For PTSD nightmares 07/17/18   Armandina Stammer I, NP  traZODone (DESYREL) 150 MG tablet Take 1  tablet (150 mg total) by mouth at bedtime as needed for sleep. For sleep 07/17/18   Sanjuana Kava, NP    Family History Family History  Problem Relation Age of Onset  . Heart attack Other     Social History Social History   Tobacco Use  . Smoking status: Current Every Day Smoker    Packs/day: 1.00    Years: 30.00    Pack years: 30.00    Types: Cigarettes  . Smokeless tobacco: Never Used  Substance Use Topics  . Alcohol use: Yes    Alcohol/week: 2.0 standard drinks    Types: 2 Cans of beer per week    Comment: Pt states "I've only drank (1) 40 in 3 months"   . Drug use: Yes    Types: Cocaine, Marijuana     Allergies   Patient has no known allergies.   Review of Systems Review of Systems  All systems reviewed and negative, other than as noted in HPI.   Physical Exam Updated Vital Signs Ht 5\' 10"  (1.778 m)   Wt 78.5 kg   BMI 24.82 kg/m   Physical Exam  Constitutional: He appears well-developed and well-nourished. No distress.  HENT:  Head: Normocephalic and atraumatic.  Eyes: Conjunctivae are normal. Right eye exhibits no discharge. Left eye exhibits no discharge.  Neck: Neck supple.  Cardiovascular: Normal rate, regular rhythm and normal heart sounds. Exam reveals no gallop and no friction rub.  No murmur heard. Pulmonary/Chest: Effort normal and breath sounds normal. No respiratory distress.  Abdominal: Soft. He exhibits no distension. There is no tenderness.  Musculoskeletal: He exhibits no edema or tenderness.  4 cm linear laceration to the mid aspect of the distal left forearm just proximal to the flexor crease.  Mild venous oozing.  Palpable radial pulse.  Neurovascular intact distally.  Neurological: He is alert.  Skin: Skin is warm and dry.  Psychiatric: His behavior is normal. Thought content normal.  Anxious  Nursing note and vitals reviewed.    ED Treatments / Results  Labs (all labs ordered are listed, but only abnormal results are  displayed) Labs Reviewed  CBC - Abnormal; Notable for the following components:      Result Value   RBC 5.84 (*)    Hemoglobin 17.2 (*)    All other components within normal limits  COMPREHENSIVE METABOLIC PANEL  ETHANOL  SALICYLATE LEVEL  ACETAMINOPHEN LEVEL  RAPID URINE DRUG SCREEN, HOSP PERFORMED    EKG None  Radiology No results found.  Procedures Procedures (including critical care time)  Medications Ordered in ED Medications  lidocaine-EPINEPHrine (XYLOCAINE W/EPI) 2 %-1:200000 (PF) injection 10 mL (has no administration in time range)  amLODipine (NORVASC) tablet 5 mg (has no administration in time range)  ARIPiprazole (ABILIFY) tablet 20 mg (has no administration in time range)  gabapentin (NEURONTIN) capsule 300 mg (has no administration in time range)  hydrOXYzine (ATARAX/VISTARIL) tablet 50 mg (has no administration in time range)  lisinopril (PRINIVIL,ZESTRIL) tablet 10 mg (has no administration in time range)  mirtazapine (REMERON) tablet 7.5 mg (has no administration in time range)  nicotine (NICODERM CQ - dosed in mg/24 hours) patch 21 mg (has no administration in time range)  pantoprazole (PROTONIX) EC tablet 40 mg (has no administration in time range)  prazosin (MINIPRESS) capsule 2 mg (has no administration in time range)  traZODone (DESYREL) tablet 150 mg (has no administration in time range)  buPROPion (WELLBUTRIN XL) 24 hr tablet 300 mg (has no administration in time range)  Tdap (BOOSTRIX) injection 0.5 mL (has no administration in time range)  LORazepam (ATIVAN) tablet 1 mg (has no administration in time range)     Initial Impression / Assessment and Plan / ED Course  I have reviewed the triage vital signs and the nursing notes.  Pertinent labs & imaging results that were available during my care of the patient were reviewed by me and considered in my medical decision making (see chart for details).     52 year old male with suicidal thoughts,  increasing anxiety depression.  Self-inflicted laceration to the left wrist repaired.  Fairly superficial.  Will medically clear for TTS evaluation.  Final Clinical Impressions(s) / ED Diagnoses   Final diagnoses:  Laceration of left wrist, initial encounter  Depression, unspecified depression type  Suicidal ideation    ED Discharge Orders    None       Raeford RazorKohut, Skylarr Liz, MD 09/10/18 0100

## 2018-09-09 NOTE — ED Notes (Signed)
[facility].

## 2018-09-10 ENCOUNTER — Encounter (HOSPITAL_COMMUNITY): Payer: Self-pay | Admitting: *Deleted

## 2018-09-10 ENCOUNTER — Inpatient Hospital Stay (HOSPITAL_COMMUNITY)
Admission: AD | Admit: 2018-09-10 | Discharge: 2018-09-15 | DRG: 885 | Disposition: A | Discharge status: Home / Self Care | Payer: Medicaid Other | Source: Intra-hospital | Attending: Psychiatry | Admitting: Psychiatry

## 2018-09-10 ENCOUNTER — Other Ambulatory Visit: Payer: Self-pay | Admitting: Registered Nurse

## 2018-09-10 ENCOUNTER — Other Ambulatory Visit: Payer: Self-pay

## 2018-09-10 DIAGNOSIS — Z915 Personal history of self-harm: Secondary | ICD-10-CM

## 2018-09-10 DIAGNOSIS — F332 Major depressive disorder, recurrent severe without psychotic features: Secondary | ICD-10-CM | POA: Diagnosis not present

## 2018-09-10 DIAGNOSIS — F1210 Cannabis abuse, uncomplicated: Secondary | ICD-10-CM | POA: Diagnosis present

## 2018-09-10 DIAGNOSIS — Z590 Homelessness: Secondary | ICD-10-CM | POA: Diagnosis not present

## 2018-09-10 DIAGNOSIS — Z811 Family history of alcohol abuse and dependence: Secondary | ICD-10-CM

## 2018-09-10 DIAGNOSIS — Z8249 Family history of ischemic heart disease and other diseases of the circulatory system: Secondary | ICD-10-CM

## 2018-09-10 DIAGNOSIS — F17200 Nicotine dependence, unspecified, uncomplicated: Secondary | ICD-10-CM | POA: Diagnosis present

## 2018-09-10 DIAGNOSIS — E785 Hyperlipidemia, unspecified: Secondary | ICD-10-CM | POA: Diagnosis present

## 2018-09-10 DIAGNOSIS — R45850 Homicidal ideations: Secondary | ICD-10-CM | POA: Diagnosis present

## 2018-09-10 DIAGNOSIS — F1410 Cocaine abuse, uncomplicated: Secondary | ICD-10-CM | POA: Diagnosis present

## 2018-09-10 DIAGNOSIS — K219 Gastro-esophageal reflux disease without esophagitis: Secondary | ICD-10-CM | POA: Diagnosis present

## 2018-09-10 DIAGNOSIS — Z825 Family history of asthma and other chronic lower respiratory diseases: Secondary | ICD-10-CM

## 2018-09-10 DIAGNOSIS — Z62810 Personal history of physical and sexual abuse in childhood: Secondary | ICD-10-CM | POA: Diagnosis not present

## 2018-09-10 DIAGNOSIS — G4700 Insomnia, unspecified: Secondary | ICD-10-CM | POA: Diagnosis present

## 2018-09-10 DIAGNOSIS — F4310 Post-traumatic stress disorder, unspecified: Secondary | ICD-10-CM | POA: Diagnosis present

## 2018-09-10 DIAGNOSIS — I10 Essential (primary) hypertension: Secondary | ICD-10-CM | POA: Diagnosis present

## 2018-09-10 DIAGNOSIS — F3132 Bipolar disorder, current episode depressed, moderate: Secondary | ICD-10-CM | POA: Diagnosis not present

## 2018-09-10 DIAGNOSIS — F3130 Bipolar disorder, current episode depressed, mild or moderate severity, unspecified: Secondary | ICD-10-CM | POA: Diagnosis present

## 2018-09-10 DIAGNOSIS — Z79899 Other long term (current) drug therapy: Secondary | ICD-10-CM | POA: Diagnosis not present

## 2018-09-10 DIAGNOSIS — F17210 Nicotine dependence, cigarettes, uncomplicated: Secondary | ICD-10-CM | POA: Diagnosis not present

## 2018-09-10 DIAGNOSIS — F1490 Cocaine use, unspecified, uncomplicated: Secondary | ICD-10-CM | POA: Diagnosis not present

## 2018-09-10 DIAGNOSIS — F1990 Other psychoactive substance use, unspecified, uncomplicated: Secondary | ICD-10-CM | POA: Diagnosis not present

## 2018-09-10 DIAGNOSIS — F1290 Cannabis use, unspecified, uncomplicated: Secondary | ICD-10-CM | POA: Diagnosis not present

## 2018-09-10 DIAGNOSIS — S61512A Laceration without foreign body of left wrist, initial encounter: Secondary | ICD-10-CM | POA: Diagnosis not present

## 2018-09-10 DIAGNOSIS — R45851 Suicidal ideations: Secondary | ICD-10-CM | POA: Diagnosis present

## 2018-09-10 MED ORDER — TRAZODONE HCL 50 MG PO TABS
50.0000 mg | ORAL_TABLET | Freq: Every evening | ORAL | Status: DC | PRN
  Filled 2018-09-10 (×4): qty 1

## 2018-09-10 MED ORDER — IBUPROFEN 400 MG PO TABS: 400.0000 mg | ORAL_TABLET | Freq: Four times a day (QID) | ORAL | Status: DC | PRN

## 2018-09-10 MED ORDER — HYDROXYZINE HCL 25 MG PO TABS
25.0000 mg | ORAL_TABLET | Freq: Once | ORAL | Status: AC
  Administered 2018-09-10: 25 mg via ORAL
  Filled 2018-09-10 (×2): qty 1

## 2018-09-10 MED ORDER — HYDROXYZINE HCL 25 MG PO TABS
25.0000 mg | ORAL_TABLET | Freq: Four times a day (QID) | ORAL | Status: DC | PRN
  Administered 2018-09-10 – 2018-09-14 (×2): 25 mg via ORAL
  Filled 2018-09-10 (×2): qty 1

## 2018-09-10 MED ORDER — ENSURE ENLIVE PO LIQD
237.0000 mL | Freq: Two times a day (BID) | ORAL | Status: DC
  Administered 2018-09-10 – 2018-09-14 (×8): 237 mL via ORAL

## 2018-09-10 MED ORDER — TRAZODONE HCL 50 MG PO TABS
50.0000 mg | ORAL_TABLET | Freq: Every day | ORAL | Status: DC
  Administered 2018-09-10: 50 mg via ORAL
  Filled 2018-09-10 (×2): qty 1

## 2018-09-10 MED ORDER — AMLODIPINE BESYLATE 5 MG PO TABS
5.0000 mg | ORAL_TABLET | Freq: Every day | ORAL | Status: DC
  Administered 2018-09-11 – 2018-09-14 (×4): 5 mg via ORAL
  Filled 2018-09-10 (×7): qty 1

## 2018-09-10 NOTE — ED Notes (Signed)
[facility]  Report has been called to [place], Charity fundraiserN.  [employer] notified of transport request

## 2018-09-10 NOTE — Tx Team (Signed)
Initial Treatment Plan 09/10/2018 4:06 PM Fredna DowGregory Cameron ZOX:[id number]    PATIENT STRESSORS: Financial difficulties Medication change or noncompliance Occupational concerns Substance abuse Traumatic event   PATIENT STRENGTHS: Average or above average intelligence Capable of independent living Communication skills General fund of knowledge Motivation for treatment/growth   PATIENT IDENTIFIED PROBLEMS: "get back on my meds"    "stop feeling this way"                 DISCHARGE CRITERIA:  Ability to meet basic life and health needs Adequate post-discharge living arrangements Improved stabilization in mood, thinking, and/or behavior Medical problems require only outpatient monitoring Motivation to continue treatment in a less acute level of care Need for constant or close observation no longer present Reduction of life-threatening or endangering symptoms to within safe limits Safe-care adequate arrangements made Verbal commitment to aftercare and medication compliance  PRELIMINARY DISCHARGE PLAN: Attend aftercare/continuing care group Attend PHP/IOP Outpatient therapy Return to previous living arrangement  PATIENT/FAMILY INVOLVEMENT: This treatment plan has been presented to and reviewed with the patient, Fredna DowGregory Padron.  The patient and family have been given the opportunity to ask questions and make suggestions.  Carlisle CaterErika C Cleve Paolillo, RN 09/10/2018, 4:06 PM

## 2018-09-10 NOTE — Progress Notes (Signed)
[facility] Nira ConnJason Berry, NP is the accepting provider.  Dr. Nehemiah MassedFernando Cobos is the attending provider.  [facility] [phone]  [email or web address] [facility] notified.   Pt is IVC   Pt may be transported by [employer] Pt scheduled  to arrive between 2:30 and 3:00 PM  Carney BernJean T. Kaylyn LimSutter, MSW, LCSWA Disposition Clinical Social Work [phone] (cell) [phone] (office)

## 2018-09-10 NOTE — ED Notes (Signed)
TTS completed. 

## 2018-09-10 NOTE — Progress Notes (Signed)
D   Pt reports feeling depressed and sad    He complained of nausea and he was also disappointed he didn't get his medications last night like he received at the other hospital    Area with sutures is clean dry and intact with no reddness or drainage  No signs of infection observed   Cleaned area with normal saline and reapplied a new bandage   Pt had some difficulty falling asleep and received an extra doe of his medication A   Verbal support and encouragement given  Medications administered and effectiveness monitored    Q 15 min checks  R   Pt is safe at this time

## 2018-09-10 NOTE — [facility] Group Notes (Signed)
[facility] Group Notes:  (Nursing/Personal Developement)  Date:  09/10/2018  Time:  1330 Type of Therapy:  Nurse Education  Participation Level:  Did Not Attend  Summary of Progress/Problems:  Pt did not attend group despite multiple prompts.     Sherryl MangesWesseh, Yosselin Zoeller 09/10/2018, 7:35 PM

## 2018-09-10 NOTE — ED Notes (Signed)
Copy of IVC paperwork faxed to [facility] 

## 2018-09-10 NOTE — Progress Notes (Signed)
Patient ID: Chase Moss, male   DOB: 12/05/1966, 52 y.o.   MRN: [id number]  EKG ordered, and pt educated on the need to complete EKG, and pt refused to have RN complete it, despite positive reinforcements from this RN.
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
## 2018-09-11 ENCOUNTER — Encounter (HOSPITAL_COMMUNITY): Payer: Self-pay | Admitting: Registered Nurse

## 2018-09-11 DIAGNOSIS — Z62810 Personal history of physical and sexual abuse in childhood: Secondary | ICD-10-CM

## 2018-09-11 DIAGNOSIS — F1990 Other psychoactive substance use, unspecified, uncomplicated: Secondary | ICD-10-CM

## 2018-09-11 DIAGNOSIS — F3132 Bipolar disorder, current episode depressed, moderate: Secondary | ICD-10-CM

## 2018-09-11 MED ORDER — BACITRACIN ZINC 500 UNIT/GM EX OINT
TOPICAL_OINTMENT | Freq: Two times a day (BID) | CUTANEOUS | Status: DC
  Administered 2018-09-11 – 2018-09-14 (×3): via TOPICAL
  Filled 2018-09-11 (×2): qty 28.35

## 2018-09-11 MED ORDER — ARIPIPRAZOLE 5 MG PO TABS
5.0000 mg | ORAL_TABLET | Freq: Every day | ORAL | Status: DC
  Administered 2018-09-11 – 2018-09-15 (×5): 5 mg via ORAL
  Filled 2018-09-11 (×9): qty 1

## 2018-09-11 MED ORDER — MIRTAZAPINE 7.5 MG PO TABS
7.5000 mg | ORAL_TABLET | Freq: Every day | ORAL | Status: DC
  Administered 2018-09-11 – 2018-09-14 (×4): 7.5 mg via ORAL
  Filled 2018-09-11 (×7): qty 1

## 2018-09-11 MED ORDER — BUPROPION HCL ER (XL) 150 MG PO TB24
150.0000 mg | ORAL_TABLET | Freq: Every day | ORAL | Status: DC
  Administered 2018-09-12 – 2018-09-15 (×4): 150 mg via ORAL
  Filled 2018-09-11 (×8): qty 1

## 2018-09-11 MED ORDER — PRAZOSIN HCL 1 MG PO CAPS
1.0000 mg | ORAL_CAPSULE | Freq: Every day | ORAL | Status: DC
  Administered 2018-09-11 – 2018-09-14 (×4): 1 mg via ORAL
  Filled 2018-09-11 (×7): qty 1

## 2018-09-11 MED ORDER — TRAZODONE HCL 50 MG PO TABS
50.0000 mg | ORAL_TABLET | Freq: Every evening | ORAL | Status: DC | PRN
  Administered 2018-09-11 – 2018-09-14 (×2): 50 mg via ORAL

## 2018-09-11 MED ORDER — BISACODYL 5 MG PO TBEC
5.0000 mg | DELAYED_RELEASE_TABLET | Freq: Every day | ORAL | Status: DC | PRN
  Administered 2018-09-11 – 2018-09-12 (×2): 5 mg via ORAL
  Filled 2018-09-11 (×2): qty 1

## 2018-09-11 NOTE — Progress Notes (Signed)
D: Patient presents with flat, blunted affect and depressed mood.  He has passive thoughts of self harm which is usually chronic.  Patient has been feeling depressed since his sister passed away.  He rates his depression and hopelessness as an 8; anxiety as a 10.  He reports his sleep is poor; he appetite is fair.  He reports low energy and good concentration.  A: Continue to monitor medication management and MD orders.  Safety checks completed every 15 minutes per protocol.  Offer support and encouragement as needed.  R: Patient is receptive to staff; her behavior is appropriate.

## 2018-09-11 NOTE — Progress Notes (Signed)
Nutrition Education Note  Pt attended group focusing on general, healthful nutrition education.  RD emphasized the importance of eating regular meals and snacks throughout the day. Consuming sugar-free beverages and incorporating fruits and vegetables into diet when possible. Provided examples of healthy snacks. Patient encouraged to leave group with a goal to improve nutrition/healthy eating. Patient encouraged to exercise and/or spend time outside daily.   Diet Order:  Diet Order            Diet regular Room service appropriate? Yes; Fluid consistency: Thin  Diet effective now             Pt is also offered choice of unit snacks mid-morning and mid-afternoon.  Pt is eating as desired.   If additional nutrition issues arise, please consult RD.    Shere Eisenhart, MS, RD, LDN, CNSC Inpatient Clinical Dietitian Pager # [phone] After hours/weekend pager # [phone]    
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
## 2018-09-11 NOTE — [facility] Suicide Risk Assessment (Signed)
[facility] INPATIENT:  Family/Significant Other Suicide Prevention Education  Suicide Prevention Education:  Patient Refusal for Family/Significant Other Suicide Prevention Education: The patient Chase DowGregory Muegge has refused to provide written consent for family/significant other to be provided Family/Significant Other Suicide Prevention Education during admission and/or prior to discharge.  Physician notified.  SPE completed with pt.  Lorri FrederickWierda, Orvan Jon, LCSW 09/11/2018, 10:08 AM
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
## 2018-09-11 NOTE — Progress Notes (Signed)
Adult Psychoeducational Group Note  Date:  09/11/2018 Time:  3:45 AM  Group Topic/Focus:  Wrap-Up Group:   The focus of this group is to help patients review their daily goal of treatment and discuss progress on daily workbooks. wrap up group.  Participation Level:  Active  Participation Quality:  Appropriate  Affect:  Appropriate  Cognitive:  Appropriate  Insight: Appropriate and Good  Engagement in Group:  Engaged  Modes of Intervention:  Discussion  Additional Comments:  Pt day was a 5 new pt he got here 16:00 today. His process getting involuntary start 75 hours.  Charna Busmanobinson, Chase Moss Long 09/11/2018, 3:45 AM

## 2018-09-11 NOTE — [facility] Group Notes (Signed)
[facility] LCSW Group Therapy Note  Date/Time: 09/11/18, 1315  Type of Therapy/Topic:  Group Therapy:  Balance in Life  Participation Level:  moderate  Description of Group:    This group will address the concept of balance and how it feels and looks when one is unbalanced. Patients will be encouraged to process areas in their lives that are out of balance, and identify reasons for remaining unbalanced. Facilitators will guide patients utilizing problem- solving interventions to address and correct the stressor making their life unbalanced. Understanding and applying boundaries will be explored and addressed for obtaining  and maintaining a balanced life. Patients will be encouraged to explore ways to assertively make their unbalanced needs known to significant others in their lives, using other group members and facilitator for support and feedback.  Therapeutic Goals: 1. Patient will identify two or more emotions or situations they have that consume much of in their lives. 2. Patient will identify signs/triggers that life has become out of balance:  3. Patient will identify two ways to set boundaries in order to achieve balance in their lives:  4. Patient will demonstrate ability to communicate their needs through discussion and/or role plays  Summary of Patient Progress:Pt identified family, friends, and financial as areas that are out of balance in his life.  Pt made several comments during group discussion about how to set boundaries and otherwise respond to areas that have become out of balance.            Therapeutic Modalities:   Cognitive Behavioral Therapy Solution-Focused Therapy Assertiveness Training  Daleen SquibbGreg Keneisha Heckart, [place]

## 2018-09-11 NOTE — Progress Notes (Signed)
NUTRITION ASSESSMENT  Pt identified as at risk on the Malnutrition Screen Tool  INTERVENTION: - Continue Ensure Enlive BID, each supplement provides 350 kcal and 20 grams of protein. - Continue to encourage PO intakes.   NUTRITION DIAGNOSIS: Unintentional weight loss related to sub-optimal intake as evidenced by pt report.   Goal: Pt to meet >/= 90% of their estimated nutrition needs.  Monitor:  PO intake  Assessment:  Patient admitted after attempting to cut L wrist with a razor. Patient had reported increased/worsening depression after the passing of his sister. He has been feeling hopeless and very anxious.   Per chart review, weight has been stable over the past 2 months. He has lost 11 lb (6% body weight) since April; this is not significant for time frame. Ensure Enlive was ordered BID per [facility] protocol at the time of admission and patient accepted Ensure yesterday. Will continue supplement.     52 y.o. male  Height: Ht Readings from Last 1 Encounters:  09/10/18 5\' 10"  (1.778 m)    Weight: Wt Readings from Last 1 Encounters:  09/10/18 78.9 kg    Weight Hx: Wt Readings from Last 10 Encounters:  09/10/18 78.9 kg  09/09/18 78.5 kg  07/07/18 78.9 kg  07/06/18 79.4 kg  03/29/18 83.9 kg  02/11/18 80.3 kg  12/11/17 78.2 kg  11/08/17 76.2 kg  11/05/17 77.6 kg  10/22/17 90.7 kg    BMI:  Body mass index is 24.97 kg/m. Pt meets criteria for normal weight/borderline overweight based on current BMI.  Estimated Nutritional Needs: Kcal: 25-30 kcal/kg Protein: > 1 gram protein/kg Fluid: 1 ml/kcal  Diet Order:  Diet Order            Diet regular Room service appropriate? Yes; Fluid consistency: Thin  Diet effective now             Pt is also offered choice of unit snacks mid-morning and mid-afternoon.  Pt is eating as desired.   Lab results and medications reviewed.     Trenton GammonJessica Caelin Rosen, MS, RD, LDN, [facility] Inpatient Clinical Dietitian Pager #  [phone] After hours/weekend pager # [phone]

## 2018-09-11 NOTE — [facility] Group Notes (Signed)
[facility] Group Notes:  (Nursing/MHT/Case Management/Adjunct)  Date:  09/11/2018  Time:  4:00 PM  Type of Therapy:  Nurse Education  Participation Level:  Did Not Attend  Summary of Progress/Problems: Patient was invited to group but chose not to attend.  Dewayne Shorterlyssa  Torii Royse 09/11/2018, 5:36 PM

## 2018-09-12 LAB — BASIC METABOLIC PANEL
ANION GAP: 8 (ref 5–15)
BUN: 16 mg/dL (ref 6–20)
CALCIUM: 9.1 mg/dL (ref 8.9–10.3)
CO2: 26 mmol/L (ref 22–32)
CREATININE: 1.11 mg/dL (ref 0.61–1.24)
Chloride: 108 mmol/L (ref 98–111)
GFR calc Af Amer: >60 mL/min (ref >60)
GFR calc non Af Amer: >60 mL/min (ref >60)
GLUCOSE: 110 mg/dL — AB (ref 70–99)
Potassium: 3.8 mmol/L (ref 3.5–5.1)
Sodium: 142 mmol/L (ref 135–145)

## 2018-09-12 NOTE — Tx Team (Signed)
Interdisciplinary Treatment and Diagnostic Plan Update  09/12/2018 Time of Session: 1538 Chase Moss MRN: [id number]  Principal Diagnosis: <principal problem not specified>  Secondary Diagnoses: Active Problems:   MDD (major depressive disorder), recurrent episode, severe (HCC)   Current Medications:  Current Facility-Administered Medications  Medication Dose Route Frequency Provider Last Rate Last Dose  . amLODipine (NORVASC) tablet 5 mg  5 mg Oral Daily Nira ConnBerry, Jason A, NP   5 mg at 09/12/18 0820  . ARIPiprazole (ABILIFY) tablet 5 mg  5 mg Oral Daily Cobos, Rockey SituFernando A, MD   5 mg at 09/12/18 0820  . bacitracin ointment   Topical BID Cobos, Rockey SituFernando A, MD      . bisacodyl (DULCOLAX) EC tablet 5 mg  5 mg Oral Daily PRN Cobos, Rockey SituFernando A, MD   5 mg at 09/11/18 1715  . buPROPion (WELLBUTRIN XL) 24 hr tablet 150 mg  150 mg Oral Daily Cobos, Rockey SituFernando A, MD   150 mg at 09/12/18 0820  . feeding supplement (ENSURE ENLIVE) (ENSURE ENLIVE) liquid 237 mL  237 mL Oral BID BM Cobos, Rockey SituFernando A, MD   237 mL at 09/11/18 2145  . hydrOXYzine (ATARAX/VISTARIL) tablet 25 mg  25 mg Oral Q6H PRN Rankin, Shuvon B, NP   25 mg at 09/10/18 2142  . ibuprofen (ADVIL,MOTRIN) tablet 400 mg  400 mg Oral Q6H PRN Rankin, Shuvon B, NP      . mirtazapine (REMERON) tablet 7.5 mg  7.5 mg Oral QHS Cobos, Rockey SituFernando A, MD   7.5 mg at 09/11/18 2145  . prazosin (MINIPRESS) capsule 1 mg  1 mg Oral QHS Cobos, Rockey SituFernando A, MD   1 mg at 09/11/18 2145  . traZODone (DESYREL) tablet 50 mg  50 mg Oral QHS PRN Cobos, Rockey SituFernando A, MD   50 mg at 09/11/18 2145   PTA Medications: Medications Prior to Admission  Medication Sig Dispense Refill Last Dose  . amLODipine (NORVASC) 5 MG tablet Take 1 tablet (5 mg total) by mouth daily. For high blood pressure (Patient not taking: Reported on 09/09/2018) 7 tablet 0 Not Taking at Unknown time  . ARIPiprazole (ABILIFY) 20 MG tablet Take 1 tablet (20 mg total) by mouth daily. For mood control (Patient  not taking: Reported on 09/09/2018) 30 tablet 0 Not Taking at Unknown time  . buPROPion (WELLBUTRIN XL) 300 MG 24 hr tablet Take 1 tablet (300 mg total) by mouth daily. For depression (Patient not taking: Reported on 09/09/2018) 30 tablet 0 Not Taking at Unknown time  . gabapentin (NEURONTIN) 300 MG capsule Take 1 capsule (300 mg total) by mouth 3 (three) times daily. For agitation (Patient not taking: Reported on 09/09/2018) 90 capsule 0 Not Taking at Unknown time  . hydrOXYzine (ATARAX/VISTARIL) 50 MG tablet Take 1 tablet (50 mg total) by mouth 3 (three) times daily as needed for anxiety. (Patient not taking: Reported on 09/09/2018) 60 tablet 0 Not Taking at Unknown time  . lisinopril (PRINIVIL,ZESTRIL) 10 MG tablet Take 1 tablet (10 mg total) by mouth daily. For high blood pressure (Patient not taking: Reported on 09/09/2018) 7 tablet 0 Not Taking at Unknown time  . mirtazapine (REMERON) 7.5 MG tablet Take 1 tablet (7.5 mg total) by mouth at bedtime. For sleep (Patient not taking: Reported on 09/09/2018) 30 tablet 0 Not Taking at Unknown time  . nicotine (NICODERM CQ - DOSED IN MG/24 HOURS) 21 mg/24hr patch Place 1 patch (21 mg total) onto the skin daily. (May buy from over the counter): For smoking  cessation (Patient not taking: Reported on 09/09/2018) 1 patch 0 Not Taking at Unknown time  . pantoprazole (PROTONIX) 40 MG tablet Take 1 tablet (40 mg total) by mouth daily. For acid reflux (Patient not taking: Reported on 09/09/2018) 7 tablet 0 Not Taking at Unknown time  . prazosin (MINIPRESS) 2 MG capsule Take 1 capsule (2 mg total) by mouth at bedtime. For PTSD nightmares (Patient not taking: Reported on 09/09/2018) 30 capsule 0 Not Taking at Unknown time  . traZODone (DESYREL) 150 MG tablet Take 1 tablet (150 mg total) by mouth at bedtime as needed for sleep. For sleep (Patient not taking: Reported on 09/09/2018) 30 tablet 0 Not Taking at Unknown time    Patient Stressors: Financial difficulties Medication  change or noncompliance Occupational concerns Substance abuse Traumatic event  Patient Strengths: Average or above average intelligence Capable of independent living Communication skills General fund of knowledge Motivation for treatment/growth  Treatment Modalities: Medication Management, Group therapy, Case management,  1 to 1 session with clinician, Psychoeducation, Recreational therapy.   Physician Treatment Plan for Primary Diagnosis: <principal problem not specified> Long Term Goal(s): Improvement in symptoms so as ready for discharge Improvement in symptoms so as ready for discharge   Short Term Goals: Ability to identify changes in lifestyle to reduce recurrence of condition will improve Ability to maintain clinical measurements within normal limits will improve Ability to identify triggers associated with substance abuse/mental health issues will improve  Medication Management: Evaluate patient's response, side effects, and tolerance of medication regimen.  Therapeutic Interventions: 1 to 1 sessions, Unit Group sessions and Medication administration.  Evaluation of Outcomes: Progressing  Physician Treatment Plan for Secondary Diagnosis: Active Problems:   MDD (major depressive disorder), recurrent episode, severe (HCC)  Long Term Goal(s): Improvement in symptoms so as ready for discharge Improvement in symptoms so as ready for discharge   Short Term Goals: Ability to identify changes in lifestyle to reduce recurrence of condition will improve Ability to maintain clinical measurements within normal limits will improve Ability to identify triggers associated with substance abuse/mental health issues will improve     Medication Management: Evaluate patient's response, side effects, and tolerance of medication regimen.  Therapeutic Interventions: 1 to 1 sessions, Unit Group sessions and Medication administration.  Evaluation of Outcomes: Progressing   RN Treatment  Plan for Primary Diagnosis: <principal problem not specified> Long Term Goal(s): Knowledge of disease and therapeutic regimen to maintain health will improve  Short Term Goals: Ability to identify and develop effective coping behaviors will improve and Compliance with prescribed medications will improve  Medication Management: RN will administer medications as ordered by provider, will assess and evaluate patient's response and provide education to patient for prescribed medication. RN will report any adverse and/or side effects to prescribing provider.  Therapeutic Interventions: 1 on 1 counseling sessions, Psychoeducation, Medication administration, Evaluate responses to treatment, Monitor vital signs and CBGs as ordered, Perform/monitor CIWA, COWS, AIMS and Fall Risk screenings as ordered, Perform wound care treatments as ordered.  Evaluation of Outcomes: Progressing   LCSW Treatment Plan for Primary Diagnosis: <principal problem not specified> Long Term Goal(s): Safe transition to appropriate next level of care at discharge, Engage patient in therapeutic group addressing interpersonal concerns.  Short Term Goals: Engage patient in aftercare planning with referrals and resources, Increase social support and Increase skills for wellness and recovery  Therapeutic Interventions: Assess for all discharge needs, 1 to 1 time with Social worker, Explore available resources and support systems, Assess for adequacy in  community support network, Educate family and significant other(s) on suicide prevention, Complete Psychosocial Assessment, Interpersonal group therapy.  Evaluation of Outcomes: Progressing   Progress in Treatment: Attending groups: Yes. Participating in groups: Yes. Taking medication as prescribed: Yes. Toleration medication: Yes. Family/Significant other contact made: No, will contact:  pt declined consent Patient understands diagnosis: Yes. Discussing patient identified  problems/goals with staff: Yes. Medical problems stabilized or resolved: Yes. Denies suicidal/homicidal ideation: Yes. Issues/concerns per patient self-inventory: No. Other: none  New problem(s) identified: No, Describe:  none  New Short Term/Long Term Goal(s):  Patient Goals:  Get on medication, change my attitude.  Discharge Plan or Barriers:   Reason for Continuation of Hospitalization: Depression Medication stabilization  Estimated Length of Stay: 2-4 days.  Attendees: Patient:Chase Moss 09/12/2018   Physician: Dr. Jama Flavors, MD 09/12/2018   Nursing: Joslyn Devon, RN 09/12/2018   RN Care Manager: 09/12/2018   Social Worker: Daleen Squibb, LCSW 09/12/2018   Recreational Therapist:  09/12/2018   Other:  09/12/2018   Other:  09/12/2018   Other: 09/12/2018        Scribe for Treatment Team: Lorri Frederick, LCSW 09/12/2018 3:59 PM

## 2018-09-12 NOTE — [facility] Group Notes (Signed)
  [facility] LCSW Group Therapy Note  Date/Time: 09/12/18, 1315  Type of Therapy/Topic:  Group Therapy:  Emotion Regulation  Participation Level:  Did Not Attend   Mood:  Description of Group:    The purpose of this group is to assist patients in learning to regulate negative emotions and experience positive emotions. Patients will be guided to discuss ways in which they have been vulnerable to their negative emotions. These vulnerabilities will be juxtaposed with experiences of positive emotions or situations, and patients challenged to use positive emotions to combat negative ones. Special emphasis will be placed on coping with negative emotions in conflict situations, and patients will process healthy conflict resolution skills.  Therapeutic Goals: 1. Patient will identify two positive emotions or experiences to reflect on in order to balance out negative emotions:  2. Patient will label two or more emotions that they find the most difficult to experience:  3. Patient will be able to demonstrate positive conflict resolution skills through discussion or role plays:   Summary of Patient Progress:       Therapeutic Modalities:   Cognitive Behavioral Therapy Feelings Identification Dialectical Behavioral Therapy  Daleen SquibbGreg [name], LCSW

## 2018-09-12 NOTE — Plan of Care (Signed)
  Problem: Education: Goal: Verbalization of understanding the information provided will improve Outcome: Completed/Met   Problem: Education: Goal: Verbalization of understanding the information provided will improve Outcome: Completed/Met

## 2018-09-12 NOTE — Progress Notes (Signed)
D: Patient has been visible in the milieu today.  He agreed to have his EKG completed.  His status has been changed to voluntary per MD.  Patient continues to report passive SI, however, he is able to contract for safety on the unit.  Patient denies any auditory/visual hallucinations.  A: Continue to monitor medication management and MD orders.  Safety checks completed every 15 minutes per protocol.  Offer support and encouragement as needed.  R: Patient is receptive to staff; his behavior is appropriate.

## 2018-09-12 NOTE — Progress Notes (Addendum)
[facility] MD Progress Note  09/12/2018 4:37 PM Chase Moss  MRN:  [id number] Subjective: Patient reports persistent depression, states "I still feel pretty bummed out and bad" .  Does acknowledge some improvement compared to prior to admission and states he feels "better now that I am here". Denies suicidal ideations and contracts for safety. Denies medication side effects. Objective : I have discussed case with treatment team and have met with patient. 52 year old male, known to [place] from prior admissions, history of bipolar disorder, PTSD and polysubstance use disorder. Reports worsening depression over recent weeks, neuro vegetative symptoms of depression, presented due to suicide attempt by cutting himself on wrist, which required suturing. Of note, reports significantly decreased substance abuse -abstinent from cocaine x2 months, reports drinking only occasionally and not in excess recently, does endorse ongoing cannabis abuse on a regular basis-admission blood alcohol level negative, admission UDS positive for cannabis. Patient reports persistent depression and subjective sense of sadness, anhedonia.  Denies suicidal ideations today.  No psychotic symptoms. Currently tolerating medications well.  On Wellbutrin XL and Abilify. No disruptive or agitated behaviors on unit. Denies suicidal ideations. Labs reviewed-creatinine improved to 1.11.    Principal Problem: Bipolar disorder depressed , substance abuse disorder  Diagnosis:   Patient Active Problem List   Diagnosis Date Noted  . MDD (major depressive disorder), recurrent episode, severe ([name]) [F33.2] 09/10/2018  . Bipolar 1 disorder, depressed ([name]) [F31.9] 07/07/2018  . Homicidal ideation [R45.850]   . Alcohol abuse with alcohol-induced mood disorder ([name]) [F10.14] 03/29/2018  . Cannabis use disorder, mild, abuse [F12.10] 02/12/2018  . Bipolar I disorder ([name]) [F31.9] 02/11/2018  . MDD (major depressive disorder) [F32.9] 12/11/2017  .  Intentional overdose of drug in tablet form ([place]) [T50.902A] 11/08/2017  . Dyslipidemia [E78.5] 11/08/2017  . Constipation [K59.00] 11/08/2017  . Drug overdose [T50.901A] 11/05/2017  . Acute alcoholic intoxication without complication ([place]) [B93.903]   . Suicidal ideation [R45.851]   . Alcohol intoxication ([place]) [F10.929] 10/22/2017  . Alcohol withdrawal ([place]) [F10.239] 10/22/2017  . Polysubstance abuse ([place]) [F19.10]   . Major depressive disorder, recurrent, severe with psychotic features ([name]) [F33.3] 05/15/2017  . Cocaine abuse with cocaine-induced mood disorder ([name]) [F14.14] 04/01/2017  . HTN (hypertension) [I10] 06/20/2016  . Tobacco use disorder [F17.200] 06/20/2016  . Trauma [T14.90XA] 07/27/2015  . Cannabis use disorder, moderate, dependence ([name]) [F12.20]   . PTSD (post-traumatic stress disorder) [F43.10]   . Substance induced mood disorder ([place]) [F19.94] 07/07/2014  . Alcohol use disorder, moderate, dependence ([name]) [F10.20] 04/10/2012  . Suicidal thoughts [R45.851] 04/04/2012   Total Time spent with patient: 20 minutes  Past Psychiatric History:   Past Medical History:  Past Medical History:  Diagnosis Date  . Bipolar 1 disorder ([place])   . Depression   . Hypertension   . PTSD (post-traumatic stress disorder)    History reviewed. No pertinent surgical history. Family History:  Family History  Problem Relation Age of Onset  . Heart attack Other    Family Psychiatric  History:  Social History:  Social History   Substance and Sexual Activity  Alcohol Use Yes  . Alcohol/week: 2.0 standard drinks  . Types: 2 Cans of beer per week   Comment: Pt states "I've only drank (1) 40 in 3 months"      Social History   Substance and Sexual Activity  Drug Use Yes  . Types: Cocaine, Marijuana    Social History   Socioeconomic History  . Marital status: Divorced    Spouse name:  Not on file  . Number of children: Not on file  . Years of education: Not on file  .  Highest education level: Not on file  Occupational History  . Not on file  Social Needs  . Financial resource strain: Not on file  . Food insecurity:    Worry: Not on file    Inability: Not on file  . Transportation needs:    Medical: Not on file    Non-medical: Not on file  Tobacco Use  . Smoking status: Current Every Day Smoker    Packs/day: 1.00    Years: 30.00    Pack years: 30.00    Types: Cigarettes  . Smokeless tobacco: Never Used  Substance and Sexual Activity  . Alcohol use: Yes    Alcohol/week: 2.0 standard drinks    Types: 2 Cans of beer per week    Comment: Pt states "I've only drank (1) 40 in 3 months"   . Drug use: Yes    Types: Cocaine, Marijuana  . Sexual activity: Yes  Lifestyle  . Physical activity:    Days per week: Not on file    Minutes per session: Not on file  . Stress: Not on file  Relationships  . Social connections:    Talks on phone: Not on file    Gets together: Not on file    Attends religious service: Not on file    Active member of club or organization: Not on file    Attends meetings of clubs or organizations: Not on file    Relationship status: Not on file  Other Topics Concern  . Not on file  Social History Narrative  . Not on file   Additional Social History:   Sleep: Improving  Appetite:  Fair  Current Medications: Current Facility-Administered Medications  Medication Dose Route Frequency Provider Last Rate Last Dose  . amLODipine (NORVASC) tablet 5 mg  5 mg Oral Daily Lindon Romp A, NP   5 mg at 09/12/18 0820  . ARIPiprazole (ABILIFY) tablet 5 mg  5 mg Oral Daily Cobos, Myer Peer, MD   5 mg at 09/12/18 0820  . bacitracin ointment   Topical BID Cobos, Myer Peer, MD      . bisacodyl (DULCOLAX) EC tablet 5 mg  5 mg Oral Daily PRN Cobos, Myer Peer, MD   5 mg at 09/11/18 1715  . buPROPion (WELLBUTRIN XL) 24 hr tablet 150 mg  150 mg Oral Daily Cobos, Myer Peer, MD   150 mg at 09/12/18 0820  . feeding supplement (ENSURE  ENLIVE) (ENSURE ENLIVE) liquid 237 mL  237 mL Oral BID BM Cobos, Myer Peer, MD   237 mL at 09/11/18 2145  . hydrOXYzine (ATARAX/VISTARIL) tablet 25 mg  25 mg Oral Q6H PRN Rankin, Shuvon B, NP   25 mg at 09/10/18 2142  . ibuprofen (ADVIL,MOTRIN) tablet 400 mg  400 mg Oral Q6H PRN Rankin, Shuvon B, NP      . mirtazapine (REMERON) tablet 7.5 mg  7.5 mg Oral QHS Cobos, Myer Peer, MD   7.5 mg at 09/11/18 2145  . prazosin (MINIPRESS) capsule 1 mg  1 mg Oral QHS Cobos, Myer Peer, MD   1 mg at 09/11/18 2145  . traZODone (DESYREL) tablet 50 mg  50 mg Oral QHS PRN Cobos, Myer Peer, MD   50 mg at 09/11/18 2145    Lab Results:  Results for orders placed or performed during the hospital encounter of 09/10/18 (from the past 48 hour(s))  Basic metabolic panel     Status: Abnormal   Collection Time: 09/12/18  6:40 AM  Result Value Ref Range   Sodium 142 135 - 145 mmol/L   Potassium 3.8 3.5 - 5.1 mmol/L   Chloride 108 98 - 111 mmol/L   CO2 26 22 - 32 mmol/L   Glucose, Bld 110 (H) 70 - 99 mg/dL   BUN 16 6 - 20 mg/dL   Creatinine, Ser 1.11 0.61 - 1.24 mg/dL   Calcium 9.1 8.9 - 10.3 mg/dL   GFR calc non Af Amer >60 >60 mL/min   GFR calc Af Amer >60 >60 mL/min    Comment: (NOTE) The eGFR has been calculated using the CKD EPI equation. This calculation has not been validated in all clinical situations. eGFR's persistently <60 mL/min signify possible Chronic Kidney Disease.    Anion gap 8 5 - 15    Comment: Performed at [facility], [place] [address] [place], [place] [address]    Blood Alcohol level:  Lab Results  Component Value Date   ETH <10 09/09/2018   ETH 19 (H) 97/98/9211    Metabolic Disorder Labs: Lab Results  Component Value Date   HGBA1C 5.4 07/16/2018   MPG 108.28 07/16/2018   MPG 100 04/09/2012   Lab Results  Component Value Date   PROLACTIN 21.0 (H) 06/21/2016   Lab Results  Component Value Date   CHOL 260 (H) 07/16/2018   TRIG 241 (H) 07/16/2018    HDL 59 07/16/2018   CHOLHDL 4.4 07/16/2018   VLDL 48 (H) 07/16/2018   LDLCALC 153 (H) 07/16/2018   LDLCALC 84 11/07/2017    Physical Findings: AIMS: Facial and Oral Movements Muscles of Facial Expression: None, normal Lips and Perioral Area: None, normal Jaw: None, normal Tongue: None, normal,Extremity Movements Upper (arms, wrists, hands, fingers): None, normal Lower (legs, knees, ankles, toes): None, normal, Trunk Movements Neck, shoulders, hips: None, normal, Overall Severity Severity of abnormal movements (highest score from questions above): None, normal Incapacitation due to abnormal movements: None, normal Patient's awareness of abnormal movements (rate only patient's report): No Awareness, Dental Status Current problems with teeth and/or dentures?: No Does patient usually wear dentures?: No  CIWA:    COWS:     Musculoskeletal: Strength & Muscle Tone: within normal limits Gait & Station: normal Patient leans: N/A  Psychiatric Specialty Exam: Physical Exam  ROS no chest pain, no shortness of breath, no vomiting  Blood pressure (!) 143/93, pulse 91, temperature 97.6 F (36.4 C), temperature source Oral, resp. rate 16, height 5' 10"  (1.778 m), weight 78.9 kg, SpO2 96 %.Body mass index is 24.97 kg/m.  General Appearance: Fairly Groomed  Eye Contact:  Good  Speech:  Normal Rate  Volume:  Normal  Mood:  Remains depressed, some improvement compared to admission  Affect:  Remains constricted and vaguely dysphoric  Thought Process:  Linear and Descriptions of Associations: Intact  Orientation:  Full (Time, Place, and Person)  Thought Content:  No hallucinations, no delusions  Suicidal Thoughts:  No-denies suicidal or self-injurious ideations, no homicidal or violent ideations, contracts for safety on unit  Homicidal Thoughts:  No  Memory:  Recent and remote grossly intact  Judgement:  Other:  Improving  Insight:  Improving  Psychomotor Activity:  Normal  Concentration:   Concentration: Good and Attention Span: Good  Recall:  Good  Fund of Knowledge:  Good  Language:  Good  Akathisia:  Negative  Handed:  Right  AIMS (if indicated):  Assets:  Desire for Improvement Resilience  ADL's:  Intact  Cognition:  WNL  Sleep:  Number of Hours: 6.75   Assessment -52 year old male, known to [place] from prior admissions, history of bipolar disorder, PTSD and polysubstance use disorder. Reports worsening depression over recent weeks, neuro vegetative symptoms of depression, presented due to suicide attempt by cutting himself on wrist, which required suturing. Patient reports some improvement compared to how he felt prior to admission, remains depressed/vaguely dysphoric.  Denies suicidal ideations.  Tolerating medications well thus far.  Treatment Plan Summary: Daily contact with patient to assess and evaluate symptoms and progress in treatment, Medication management, Plan Inpatient treatment and Medications as below Encourage group and milieu participation to work on coping skills and symptom reduction Continue Abilify 5 mg daily for mood disorder Continue Wellbutrin XL 150 mg daily for depression Continue Minipress 1 mg nightly for PTSD related nightmares Continue Remeron 7.5 mg nightly for depression and insomnia Continue Vistaril 25 mg every 6 hours as needed for anxiety as needed Continue trazodone 50 mg nightly PRN for insomnia as needed Treatment team working on disposition planning options Jenne Campus, MD 09/12/2018, 4:37 PM

## 2018-09-12 NOTE — Progress Notes (Signed)
Pt did not attend wrap-up group   

## 2018-09-12 NOTE — [facility] Group Notes (Signed)
Pt was invited but did not attend orientation/goals group. 

## 2018-09-12 NOTE — Progress Notes (Signed)
D: Patient observed up in dayroom with some interaction with select peers. Patient states his day has been "so-so." "I spent a lot of it in bed. I think I'm groggy from the medicine last night. Before today I was going to groups. Today I was just really tired. The doctor said to give it a few days and I plan to do that." Patient's affect flat, sad with congruent mood. Denies pain, physical complaints.   A: Medicated per orders, no prns requested or needed thus far. Medication education provided. Level III obs in place for safety. Emotional support offered. Patient encouraged to complete Suicide Safety Plan before discharge. Encouraged to attend and participate in unit programming.    R: Patient verbalizes understanding of POC. Patient endorsing passive SI however states he is able to contract for safety. "I feel safe here." No HI/VH at this time. Patient remains safe on level III obs. Will continue to monitor throughout the night.

## 2018-09-12 NOTE — [facility] Group Notes (Signed)
Pt was invited but did not attend psycho-ed group.  

## 2018-09-12 NOTE — Progress Notes (Signed)
Recreation Therapy Notes  Date: 9.20.19 Time: 0930 Location: 300 Hall Dayroom  Group Topic: Stress Management  Goal Area(s) Addresses:  Patient will verbalize importance of using healthy stress management.  Patient will identify positive emotions associated with healthy stress management.   Intervention: Stress Management  Activity : Progressive Muscle Relaxation.  LRT introduced the stress management technique of progressive muscle relaxation.  LRT read a script and lead patients through a series of techniques that guided them to tense each muscle group individually and then relax them.  Patients were to listen as script was read to engage in activity.  Education:  Stress Management, Discharge Planning.   Education Outcome: Acknowledges edcuation/In group clarification offered/Needs additional education  Clinical Observations/Feedback: Pt did not attend group.    Chase Moss, LRT/CTRS         Amias Hutchinson A 09/12/2018 12:25 PM 

## 2018-09-13 DIAGNOSIS — R45850 Homicidal ideations: Secondary | ICD-10-CM

## 2018-09-13 DIAGNOSIS — R45851 Suicidal ideations: Secondary | ICD-10-CM

## 2018-09-13 DIAGNOSIS — F17210 Nicotine dependence, cigarettes, uncomplicated: Secondary | ICD-10-CM

## 2018-09-13 DIAGNOSIS — F1490 Cocaine use, unspecified, uncomplicated: Secondary | ICD-10-CM

## 2018-09-13 DIAGNOSIS — F332 Major depressive disorder, recurrent severe without psychotic features: Secondary | ICD-10-CM

## 2018-09-13 DIAGNOSIS — F1290 Cannabis use, unspecified, uncomplicated: Secondary | ICD-10-CM

## 2018-09-13 NOTE — Progress Notes (Signed)
D: Pt has been calm and appropriate. He hasn't attended groups but did leave the unit for recreation time. He's been interacting with peers. He denied SI, HI, and AVH to this Clinical research associatewriter but endorsed it to MD. On his self inventory form, he reported good sleep, good appetite, normal energy level, and good concentration. He rated his depression, hopelessness, and anxiety all  5/10 (ten being worst). His goal was to work on "my attitude towards life."   A: Meds given as ordered. No PRNs requested or given. Q15 safety checks maintained. Support/encouragement offered.  R: Pt remains free from harm and continues with treatment. Will continue to monitor for needs/safety.

## 2018-09-13 NOTE — Progress Notes (Signed)
[facility] MD Progress Note  09/13/2018 1:46 PM Chase Moss  MRN:  [id number] Subjective: Patient is seen and examined.  Patient is a 52 year old male with a past psychiatric history significant for bipolar disorder, depression, and substance use disorders.  He is seen in follow-up.  The patient was admitted after he had presented to the [facility] emergency department on 9/17.  He came in after cutting his wrist.  The patient stated he was suicidal and still having homicidal thoughts of killing the drunk who killed his mother.  The patient stated the person lives in [place].  Patient also stated he was stressed because of his sister dying.  The patient currently living in a boardinghouse.  He had been discharged from the hospital earlier this year, but did not get follow-up.  He has been off his medications for several months.  He was discharged on Abilify, Remeron and Wellbutrin.  Those were restarted.  He stated he is essentially the same.  He stated he still isolating, still having suicidal thoughts, still feels hopeless. Principal Problem: <principal problem not specified> Diagnosis:   Patient Active Problem List   Diagnosis Date Noted  . MDD (major depressive disorder), recurrent episode, severe ([place]) [F33.2] 09/10/2018  . Moderate bipolar I disorder, most recent episode depressed ([place]) [F31.32] 07/07/2018  . Homicidal ideation [R45.850]   . Alcohol abuse with alcohol-induced mood disorder ([place]) [F10.14] 03/29/2018  . Cannabis use disorder, mild, abuse [F12.10] 02/12/2018  . Bipolar I disorder ([place]) [F31.9] 02/11/2018  . MDD (major depressive disorder) [F32.9] 12/11/2017  . Intentional overdose of drug in tablet form ([place]) [T50.902A] 11/08/2017  . Dyslipidemia [E78.5] 11/08/2017  . Constipation [K59.00] 11/08/2017  . Drug overdose [T50.901A] 11/05/2017  . Acute alcoholic intoxication without complication ([place]) [G29.528]   . Suicidal ideation [R45.851]   . Alcohol intoxication ([place]) [F10.929]  10/22/2017  . Alcohol withdrawal ([place]) [F10.239] 10/22/2017  . Polysubstance abuse ([place]) [F19.10]   . Major depressive disorder, recurrent, severe with psychotic features ([place]) [F33.3] 05/15/2017  . Cocaine abuse with cocaine-induced mood disorder ([place]) [F14.14] 04/01/2017  . HTN (hypertension) [I10] 06/20/2016  . Tobacco use disorder [F17.200] 06/20/2016  . Trauma [T14.90XA] 07/27/2015  . Cannabis use disorder, moderate, dependence ([place]) [F12.20]   . PTSD (post-traumatic stress disorder) [F43.10]   . Substance induced mood disorder ([place]) [F19.94] 07/07/2014  . Alcohol use disorder, moderate, dependence ([place]) [F10.20] 04/10/2012  . Suicidal thoughts [R45.851] 04/04/2012   Total Time spent with patient: 15 minutes  Past Psychiatric History: See admission H&P  Past Medical History:  Past Medical History:  Diagnosis Date  . Bipolar 1 disorder ([place])   . Depression   . Hypertension   . PTSD (post-traumatic stress disorder)    History reviewed. No pertinent surgical history. Family History:  Family History  Problem Relation Age of Onset  . Heart attack Other    Family Psychiatric  History: See admission H&P Social History:  Social History   Substance and Sexual Activity  Alcohol Use Yes  . Alcohol/week: 2.0 standard drinks  . Types: 2 Cans of beer per week   Comment: Pt states "I've only drank (1) 40 in 3 months"      Social History   Substance and Sexual Activity  Drug Use Yes  . Types: Cocaine, Marijuana    Social History   Socioeconomic History  . Marital status: Divorced    Spouse name: Not on file  . Number of children: Not on file  . Years of education: Not  on file  . Highest education level: Not on file  Occupational History  . Not on file  Social Needs  . Financial resource strain: Not on file  . Food insecurity:    Worry: Not on file    Inability: Not on file  . Transportation needs:    Medical: Not on file    Non-medical: Not on file  Tobacco Use   . Smoking status: Current Every Day Smoker    Packs/day: 1.00    Years: 30.00    Pack years: 30.00    Types: Cigarettes  . Smokeless tobacco: Never Used  Substance and Sexual Activity  . Alcohol use: Yes    Alcohol/week: 2.0 standard drinks    Types: 2 Cans of beer per week    Comment: Pt states "I've only drank (1) 40 in 3 months"   . Drug use: Yes    Types: Cocaine, Marijuana  . Sexual activity: Yes  Lifestyle  . Physical activity:    Days per week: Not on file    Minutes per session: Not on file  . Stress: Not on file  Relationships  . Social connections:    Talks on phone: Not on file    Gets together: Not on file    Attends religious service: Not on file    Active member of club or organization: Not on file    Attends meetings of clubs or organizations: Not on file    Relationship status: Not on file  Other Topics Concern  . Not on file  Social History Narrative  . Not on file   Additional Social History:                         Sleep: Fair  Appetite:  Fair  Current Medications: Current Facility-Administered Medications  Medication Dose Route Frequency Provider Last Rate Last Dose  . amLODipine (NORVASC) tablet 5 mg  5 mg Oral Daily Lindon Romp A, NP   5 mg at 09/13/18 0852  . ARIPiprazole (ABILIFY) tablet 5 mg  5 mg Oral Daily Cobos, Myer Peer, MD   5 mg at 09/13/18 0852  . bacitracin ointment   Topical BID Cobos, Myer Peer, MD   Stopped at 09/13/18 [phone]  . bisacodyl (DULCOLAX) EC tablet 5 mg  5 mg Oral Daily PRN Cobos, Myer Peer, MD   5 mg at 09/12/18 2106  . buPROPion (WELLBUTRIN XL) 24 hr tablet 150 mg  150 mg Oral Daily Cobos, Myer Peer, MD   150 mg at 09/13/18 0852  . feeding supplement (ENSURE ENLIVE) (ENSURE ENLIVE) liquid 237 mL  237 mL Oral BID BM Cobos, Myer Peer, MD   237 mL at 09/13/18 0854  . hydrOXYzine (ATARAX/VISTARIL) tablet 25 mg  25 mg Oral Q6H PRN Rankin, Shuvon B, NP   25 mg at 09/10/18 2142  . ibuprofen (ADVIL,MOTRIN)  tablet 400 mg  400 mg Oral Q6H PRN Rankin, Shuvon B, NP      . mirtazapine (REMERON) tablet 7.5 mg  7.5 mg Oral QHS Cobos, Myer Peer, MD   7.5 mg at 09/12/18 2104  . prazosin (MINIPRESS) capsule 1 mg  1 mg Oral QHS Cobos, Myer Peer, MD   1 mg at 09/12/18 2104  . traZODone (DESYREL) tablet 50 mg  50 mg Oral QHS PRN Cobos, Myer Peer, MD   50 mg at 09/11/18 2145    Lab Results:  Results for orders placed or performed during the hospital encounter  of 09/10/18 (from the past 48 hour(s))  Basic metabolic panel     Status: Abnormal   Collection Time: 09/12/18  6:40 AM  Result Value Ref Range   Sodium 142 135 - 145 mmol/L   Potassium 3.8 3.5 - 5.1 mmol/L   Chloride 108 98 - 111 mmol/L   CO2 26 22 - 32 mmol/L   Glucose, Bld 110 (H) 70 - 99 mg/dL   BUN 16 6 - 20 mg/dL   Creatinine, Ser 1.11 0.61 - 1.24 mg/dL   Calcium 9.1 8.9 - 10.3 mg/dL   GFR calc non Af Amer >60 >60 mL/min   GFR calc Af Amer >60 >60 mL/min    Comment: (NOTE) The eGFR has been calculated using the CKD EPI equation. This calculation has not been validated in all clinical situations. eGFR's persistently <60 mL/min signify possible Chronic Kidney Disease.    Anion gap 8 5 - 15    Comment: Performed at [facility], [place] [address], [place], [place] [address]    Blood Alcohol level:  Lab Results  Component Value Date   ETH <10 09/09/2018   ETH 19 (H) 81/85/9093    Metabolic Disorder Labs: Lab Results  Component Value Date   HGBA1C 5.4 07/16/2018   MPG 108.28 07/16/2018   MPG 100 04/09/2012   Lab Results  Component Value Date   PROLACTIN 21.0 (H) 06/21/2016   Lab Results  Component Value Date   CHOL 260 (H) 07/16/2018   TRIG 241 (H) 07/16/2018   HDL 59 07/16/2018   CHOLHDL 4.4 07/16/2018   VLDL 48 (H) 07/16/2018   LDLCALC 153 (H) 07/16/2018   LDLCALC 84 11/07/2017    Physical Findings: AIMS: Facial and Oral Movements Muscles of Facial Expression: None, normal Lips and Perioral  Area: None, normal Jaw: None, normal Tongue: None, normal,Extremity Movements Upper (arms, wrists, hands, fingers): None, normal Lower (legs, knees, ankles, toes): None, normal, Trunk Movements Neck, shoulders, hips: None, normal, Overall Severity Severity of abnormal movements (highest score from questions above): None, normal Incapacitation due to abnormal movements: None, normal Patient's awareness of abnormal movements (rate only patient's report): No Awareness, Dental Status Current problems with teeth and/or dentures?: No Does patient usually wear dentures?: No  CIWA:    COWS:     Musculoskeletal: Strength & Muscle Tone: within normal limits Gait & Station: normal Patient leans: N/A  Psychiatric Specialty Exam: Physical Exam  Nursing note and vitals reviewed. Constitutional: He is oriented to person, place, and time. He appears well-developed and well-nourished.  HENT:  Head: Normocephalic and atraumatic.  Respiratory: Effort normal.  Neurological: He is alert and oriented to person, place, and time.    ROS  Blood pressure (!) 135/94, pulse 87, temperature 98.4 F (36.9 C), temperature source Oral, resp. rate (!) 24, height 5' 10"  (1.778 m), weight 78.9 kg, SpO2 100 %.Body mass index is 24.97 kg/m.  General Appearance: Casual  Eye Contact:  Fair  Speech:  Normal Rate  Volume:  Decreased  Mood:  Depressed  Affect:  Congruent  Thought Process:  Coherent and Descriptions of Associations: Intact  Orientation:  Full (Time, Place, and Person)  Thought Content:  Logical  Suicidal Thoughts:  Yes.  without intent/plan  Homicidal Thoughts:  Yes.  without intent/plan  Memory:  Immediate;   Fair Recent;   Fair Remote;   Fair  Judgement:  Impaired  Insight:  Lacking  Psychomotor Activity:  Psychomotor Retardation  Concentration:  Concentration: Fair and Attention Span:  Fair  Recall:  Smiley Houseman of Knowledge:  Fair  Language:  Fair  Akathisia:  Negative  Handed:  Right   AIMS (if indicated):     Assets:  Desire for Improvement Housing  ADL's:  Intact  Cognition:  WNL  Sleep:  Number of Hours: 6.5     Treatment Plan Summary: Daily contact with patient to assess and evaluate symptoms and progress in treatment, Medication management and Plan : Patient is seen and examined.  Patient is a 52 year old male with a history of bipolar disorder, PTSD, polysubstance use disorder.  He is seen in follow-up.  #1 continue Abilify 5 mg p.o. daily for mood disorder.  #2 continue Wellbutrin XL 150 mg p.o. daily for depression.  #3 continue Minipress 1 mg p.o. nightly for PTSD related nightmares.  #4 continue Remeron 7.5 mg p.o. nightly for depression and insomnia.  #5 continue Vistaril 25 mg every 6 hours as needed anxiety as needed.  #6 continue trazodone 50 mg p.o. nightly as needed insomnia as needed.  #7 disposition planning-in progress.  Sharma Covert, MD 09/13/2018, 1:46 PM

## 2018-09-13 NOTE — Progress Notes (Signed)
D: Patient observed resting in bed this evening and elected not to attend group. States today has been "about the same as yesterday. I have been up more." Patient's affect very flat, depressed with congruent mood. Denies pain, physical complaints.   A: Will medicated per orders at 2100 per patient's request. No prns requested or needed. Medication education will be provided. Level III obs in place for safety. Emotional support offered. Patient encouraged to complete Suicide Safety Plan before discharge. Encouraged to attend and participate in unit programming.    R: Patient verbalizes understanding of POC. Patient had endorsed passive SI earlier in the day however patient denies SI to this writer at present and earlier today. Patient denies HI/AVH and remains safe on level III obs. Will continue to monitor throughout the night.

## 2018-09-13 NOTE — [facility] Group Notes (Signed)
LCSW Group Therapy Note  09/13/2018   10:00-11:00am   Type of Therapy and Topic:  Group Therapy: Anger Cues and Responses  Participation Level:  Did Not Attend   Description of Group:   In this group, patients learned how to recognize the physical, cognitive, emotional, and behavioral responses they have to anger-provoking situations.  They identified a recent time they became angry and how they reacted.  They analyzed how their reaction was possibly beneficial and how it was possibly unhelpful.  The group discussed a variety of healthier coping skills that could help with such a situation in the future.  Deep breathing was practiced briefly.  Therapeutic Goals: 1. Patients will remember their last incident of anger and how they felt emotionally and physically, what their thoughts were at the time, and how they behaved. 2. Patients will identify how their behavior at that time worked for them, as well as how it worked against them. 3. Patients will explore possible new behaviors to use in future anger situations. 4. Patients will learn that anger itself is normal and cannot be eliminated, and that healthier reactions can assist with resolving conflict rather than worsening situations.  Summary of Patient Progress:  N/A  Therapeutic Modalities:   Cognitive Behavioral Therapy  Taiylor Virden J Grossman-Orr    

## 2018-09-13 NOTE — [facility] Group Notes (Signed)
[facility] Group Notes:  (Nursing)  Date:  09/13/2018  Time: 1:30 PM Type of Therapy:  Nurse Education  Participation Level:  Active  Participation Quality:  Appropriate  Affect:  Appropriate  Cognitive:  Appropriate  Insight:  Appropriate  Engagement in Group:  Engaged  Modes of Intervention:  Discussion and Education  Summary of Progress/Problems: Nurse led group played non competitive learning/communication board game that fosters listening skills as well as self expression. Chase Moss 09/13/2018, 4:27 PM

## 2018-09-13 NOTE — Progress Notes (Signed)
Adult Psychoeducational Group Note  Date:  09/13/2018 Time:  3:49 AM  Group Topic/Focus:  Wrap-Up Group:   The focus of this group is to help patients review their daily goal of treatment and discuss progress on daily workbooks.  Participation Level:  Active  Participation Quality:  Appropriate  Affect:  Appropriate  Cognitive:  Appropriate  Insight: Appropriate  Engagement in Group:  Engaged  Modes of Intervention:  Discussion  Additional Comments:  Pt stated he started new meds and he literally slept the day away.  Pt rated the day at a 5/10.  Brando Taves 09/13/2018, 3:49 AM

## 2018-09-13 NOTE — Plan of Care (Signed)
   Problem: Education: Goal: Knowledge of [facility] Education information/materials will improve Outcome: Completed/Met Patient verbalizes understanding of information, education provided.    Problem: Coping: Goal: Ability to demonstrate self-control will improve Outcome: Progressing Patient has not engaged in disruptive behaviors. Calm and cooperative.

## 2018-09-14 MED ORDER — AMLODIPINE BESYLATE 5 MG PO TABS
5.0000 mg | ORAL_TABLET | Freq: Once | ORAL | Status: AC
  Administered 2018-09-14: 5 mg via ORAL
  Filled 2018-09-14 (×2): qty 1

## 2018-09-14 MED ORDER — LISINOPRIL 5 MG PO TABS
5.0000 mg | ORAL_TABLET | Freq: Every day | ORAL | Status: DC
  Administered 2018-09-14 – 2018-09-15 (×2): 5 mg via ORAL
  Filled 2018-09-14 (×6): qty 1

## 2018-09-14 MED ORDER — AMLODIPINE BESYLATE 10 MG PO TABS
10.0000 mg | ORAL_TABLET | Freq: Every day | ORAL | Status: DC
  Administered 2018-09-15: 10 mg via ORAL
  Filled 2018-09-14 (×4): qty 1

## 2018-09-14 NOTE — Progress Notes (Signed)
[facility] MD Progress Note  09/14/2018 3:03 PM Chase Moss  MRN:  [id number] Subjective: Patient is seen and examined.  Patient is a 52 year old male with a past psychiatric history significant for bipolar disorder, depression and substance use disorder.  He is seen in follow-up.  He stated he is essentially unchanged.  He has been off his medications for several months, and he was just restarted on his Abilify, Remeron and Wellbutrin on admission.  He denied any side effects to his current medications.  He denied any current suicidal ideation.  He is concerned about his blood pressure.  Review of the record showed he had been previously treated with amlodipine as well as lisinopril.  He has been receiving 5 mg of amlodipine since admission, but I told him we would increase that, and also place him back on lisinopril.  He stated he still feels like he is isolating, still having hopeless and helpless feelings. Principal Problem: <principal problem not specified> Diagnosis:   Patient Active Problem List   Diagnosis Date Noted  . MDD (major depressive disorder), recurrent episode, severe (HCC) [F33.2] 09/10/2018  . Moderate bipolar I disorder, most recent episode depressed (HCC) [F31.32] 07/07/2018  . Homicidal ideation [R45.850]   . Alcohol abuse with alcohol-induced mood disorder (HCC) [F10.14] 03/29/2018  . Cannabis use disorder, mild, abuse [F12.10] 02/12/2018  . Bipolar I disorder (HCC) [F31.9] 02/11/2018  . MDD (major depressive disorder) [F32.9] 12/11/2017  . Intentional overdose of drug in tablet form (HCC) [T50.902A] 11/08/2017  . Dyslipidemia [E78.5] 11/08/2017  . Constipation [K59.00] 11/08/2017  . Drug overdose [T50.901A] 11/05/2017  . Acute alcoholic intoxication without complication (HCC) [F10.920]   . Suicidal ideation [R45.851]   . Alcohol intoxication (HCC) [F10.929] 10/22/2017  . Alcohol withdrawal (HCC) [F10.239] 10/22/2017  . Polysubstance abuse (HCC) [F19.10]   . 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 Cocaine abuse with cocaine-induced mood disorder (HCC) [F14.14] 04/01/2017  . HTN (hypertension) [I10] 06/20/2016  . Tobacco use disorder [F17.200] 06/20/2016  . Trauma [T14.90XA] 07/27/2015  . Cannabis use disorder, moderate, dependence (HCC) [F12.20]   . PTSD (post-traumatic stress disorder) [F43.10]   . Substance induced mood disorder (HCC) [F19.94] 07/07/2014  . Alcohol use disorder, moderate, dependence (HCC) [F10.20] 04/10/2012  . Suicidal thoughts [R45.851] 04/04/2012   Total Time spent with patient: 15 minutes  Past Psychiatric History: See admission H&P  Past Medical History:  Past Medical History:  Diagnosis Date  . Bipolar 1 disorder (HCC)   . Depression   . Hypertension   . PTSD (post-traumatic stress disorder)    History reviewed. No pertinent surgical history. Family History:  Family History  Problem Relation Age of Onset  . Heart attack Other    Family Psychiatric  History: See admission H&P Social History:  Social History   Substance and Sexual Activity  Alcohol Use Yes  . Alcohol/week: 2.0 standard drinks  . Types: 2 Cans of beer per week   Comment: Pt states "I've only drank (1) 40 in 3 months"      Social History   Substance and Sexual Activity  Drug Use Yes  . Types: Cocaine, Marijuana    Social History   Socioeconomic History  . Marital status: Divorced    Spouse name: Not on file  . Number of children: Not on file  . Years of education: Not on file  . Highest education level: Not on file  Occupational History  . Not on file  Social  Needs  . Financial resource strain: Not on file  . Food insecurity:    Worry: Not on file    Inability: Not on file  . Transportation needs:    Medical: Not on file    Non-medical: Not on file  Tobacco Use  . Smoking status: Current Every Day Smoker    Packs/day: 1.00    Years: 30.00    Pack years: 30.00     Types: Cigarettes  . Smokeless tobacco: Never Used  Substance and Sexual Activity  . Alcohol use: Yes    Alcohol/week: 2.0 standard drinks    Types: 2 Cans of beer per week    Comment: Pt states "I've only drank (1) 40 in 3 months"   . Drug use: Yes    Types: Cocaine, Marijuana  . Sexual activity: Yes  Lifestyle  . Physical activity:    Days per week: Not on file    Minutes per session: Not on file  . Stress: Not on file  Relationships  . Social connections:    Talks on phone: Not on file    Gets together: Not on file    Attends religious service: Not on file    Active member of club or organization: Not on file    Attends meetings of clubs or organizations: Not on file    Relationship status: Not on file  Other Topics Concern  . Not on file  Social History Narrative  . Not on file   Additional Social History:                         Sleep: Good  Appetite:  Fair  Current Medications: Current Facility-Administered Medications  Medication Dose Route Frequency Provider Last Rate Last Dose  . [START ON 09/15/2018] amLODipine (NORVASC) tablet 10 mg  10 mg Oral Daily Antonieta Pert, MD      . amLODipine (NORVASC) tablet 5 mg  5 mg Oral Once Antonieta Pert, MD      . ARIPiprazole (ABILIFY) tablet 5 mg  5 mg Oral Daily Cobos, Rockey Situ, MD   5 mg at 09/14/18 1053  . bacitracin ointment   Topical BID Cobos, Rockey Situ, MD      . bisacodyl (DULCOLAX) EC tablet 5 mg  5 mg Oral Daily PRN Cobos, Rockey Situ, MD   5 mg at 09/12/18 2106  . buPROPion (WELLBUTRIN XL) 24 hr tablet 150 mg  150 mg Oral Daily Cobos, Rockey Situ, MD   150 mg at 09/14/18 1053  . feeding supplement (ENSURE ENLIVE) (ENSURE ENLIVE) liquid 237 mL  237 mL Oral BID BM Cobos, Fernando A, MD   237 mL at 09/14/18 1457  . hydrOXYzine (ATARAX/VISTARIL) tablet 25 mg  25 mg Oral Q6H PRN Rankin, Shuvon B, NP   25 mg at 09/10/18 2142  . ibuprofen (ADVIL,MOTRIN) tablet 400 mg  400 mg Oral Q6H PRN Rankin,  Shuvon B, NP      . lisinopril (PRINIVIL,ZESTRIL) tablet 5 mg  5 mg Oral Daily Antonieta Pert, MD      . mirtazapine (REMERON) tablet 7.5 mg  7.5 mg Oral QHS Cobos, Fernando A, MD   7.5 mg at 09/13/18 2100  . prazosin (MINIPRESS) capsule 1 mg  1 mg Oral QHS Cobos, Rockey Situ, MD   1 mg at 09/13/18 2100  . traZODone (DESYREL) tablet 50 mg  50 mg Oral QHS PRN Cobos, Rockey Situ, MD   50 mg at  09/11/18 2145    Lab Results: No results found for this or any previous visit (from the past 48 hour(s)).  Blood Alcohol level:  Lab Results  Component Value Date   ETH <10 09/09/2018   ETH 19 (H) 07/06/2018    Metabolic Disorder Labs: Lab Results  Component Value Date   HGBA1C 5.4 07/16/2018   MPG 108.28 07/16/2018   MPG 100 04/09/2012   Lab Results  Component Value Date   PROLACTIN 21.0 (H) 06/21/2016   Lab Results  Component Value Date   CHOL 260 (H) 07/16/2018   TRIG 241 (H) 07/16/2018   HDL 59 07/16/2018   CHOLHDL 4.4 07/16/2018   VLDL 48 (H) 07/16/2018   LDLCALC 153 (H) 07/16/2018   LDLCALC 84 11/07/2017    Physical Findings: AIMS: Facial and Oral Movements Muscles of Facial Expression: None, normal Lips and Perioral Area: None, normal Jaw: None, normal Tongue: None, normal,Extremity Movements Upper (arms, wrists, hands, fingers): None, normal Lower (legs, knees, ankles, toes): None, normal, Trunk Movements Neck, shoulders, hips: None, normal, Overall Severity Severity of abnormal movements (highest score from questions above): None, normal Incapacitation due to abnormal movements: None, normal Patient's awareness of abnormal movements (rate only patient's report): No Awareness, Dental Status Current problems with teeth and/or dentures?: No Does patient usually wear dentures?: No  CIWA:    COWS:     Musculoskeletal: Strength & Muscle Tone: within normal limits Gait & Station: normal Patient leans: N/A  Psychiatric Specialty Exam: Physical Exam  Nursing note  and vitals reviewed. Constitutional: He is oriented to person, place, and time. He appears well-developed and well-nourished.  HENT:  Head: Normocephalic and atraumatic.  Respiratory: Effort normal.  Neurological: He is alert and oriented to person, place, and time.    ROS  Blood pressure (!) 150/94, pulse 85, temperature 97.7 F (36.5 C), temperature source Oral, resp. rate (!) 24, height 5\' 10"  (1.778 m), weight 78.9 kg, SpO2 100 %.Body mass index is 24.97 kg/m.  General Appearance: Casual  Eye Contact:  Fair  Speech:  Normal Rate  Volume:  Decreased  Mood:  Depressed  Affect:  Congruent  Thought Process:  Coherent and Descriptions of Associations: Intact  Orientation:  Full (Time, Place, and Person)  Thought Content:  Logical  Suicidal Thoughts:  Yes.  without intent/plan  Homicidal Thoughts:  No  Memory:  Immediate;   Fair Recent;   Fair Remote;   Fair  Judgement:  Intact  Insight:  Fair  Psychomotor Activity:  Decreased  Concentration:  Concentration: Fair and Attention Span: Fair  Recall:  [employer] of Knowledge:  Fair  Language:  Fair  Akathisia:  Negative  Handed:  Right  AIMS (if indicated):     Assets:  Desire for Improvement Housing Physical Health Resilience  ADL's:  Intact  Cognition:  WNL  Sleep:  Number of Hours: 6     Treatment Plan Summary: Daily contact with patient to assess and evaluate symptoms and progress in treatment, Medication management and Plan : Patient is seen and examined.  Patient is a 52 year old male with a past psychiatric history significant for bipolar disorder, PTSD and polysubstance use disorder.  He is seen in follow-up.  #1 depression's/bipolar disorder/PTSD-continue Abilify 5 mg p.o. daily for mood disorder, continue Wellbutrin XL 150 mg p.o. daily for depression, continue Minipress 1 mg p.o. nightly for PTSD related nightmares, continue Remeron 7.5 mg p.o. nightly for depression and insomnia.  #2 hypertension increase  amlodipine to 10 mg  p.o. daily and add lisinopril 5 mg p.o. daily.  #3 disposition-discharge planning in progress.  Antonieta PertGreg Lawson Clary, MD 09/14/2018, 3:03 PM

## 2018-09-14 NOTE — Progress Notes (Signed)
D    Pt admits to sleeping most of the day and said he will need some medication to help him sleep tonight    He reports depression and anxiety have made minimal improvement since coming to the hospital    He said the doctor has not discussed discharge plans with him yet   Pt behavior is appropriate A    Verbal and encouragement given   Educate on medications and administered according to doctor orders   Will evaluate effectiveness   Q 15 min checks R   Pt is safe at this time

## 2018-09-14 NOTE — Progress Notes (Signed)
Adult Psychoeducational Group Note  Date:  09/14/2018 Time:  1:48 AM  Group Topic/Focus:  Wrap-Up Group:   The focus of this group is to help patients review their daily goal of treatment and discuss progress on daily workbooks.  Participation Level:  Active  Participation Quality:  Appropriate  Affect:  Appropriate  Cognitive:  Appropriate  Insight: Appropriate  Engagement in Group:  Engaged  Modes of Intervention:  Discussion  Additional Comments:  Pt stated his goal for today was to talk with his doctor about his medication issues. Pt stated he accomplished this goal and the doctor agree to lower the dose on his medication because it cause pt to sleep all day long. Pt rated the over all day a 5 out of 10. Pt stated he did attend all groups held today. Pt stated the talk with his doctor help improve his day.  Felipa FurnaceChristopher  Corlis Angelica 09/14/2018, 1:48 AM

## 2018-09-14 NOTE — [facility] Group Notes (Signed)
Adult Psychoeducational Group Note  Date:  09/14/2018 Time:  1315  Group Topic/Focus: Love Languages Healthy Communication:   The focus of this group is to discuss communication, barriers to communication, as well as healthy ways to communicate with others.  Participation Level:  minimal/arrived late  Participation Quality:  quiet  Affect:  Appropriate  Cognitive:  Appropriate  Insight: N/A  Engagement in Group:  Disengaged  Modes of Intervention:  Discussion, Education, Exploration and Socialization  Additional Comments:  Patient did not verbally engage in group.  Jonathon BellowsJennifer M Lorrin Bodner 09/14/2018, 2:49 PM

## 2018-09-14 NOTE — Plan of Care (Signed)
  Problem: Education: Goal: Emotional status will improve Outcome: Progressing  Patient has gotten progressively more engaged as the day goes.  Problem: Activity: Goal: Interest or engagement in activities will improve Outcome: Progressing  Pt. Seen in the day room playing chess with a new admission.

## 2018-09-14 NOTE — [facility] Group Notes (Signed)
Data: Pt. Remained in bed through breakfast and despite attempts by the assigned nurse to wake him for medication administration. He reported getting no sleep due to constant dreaming and medication changes. He commented that he did not feel well. He came down for medications around 11am and remained in the day room for most of the day after that. Other than late morning meds, patient was complaint with scheduled medications. Patient denies pain/physical complaints. Patient completed self-inventory in the evening (1830) sheet and rates depression, hopelessness, and anxiety7, 7, and 7respectively. Patient rates their sleep and appetite aspoor and goodrespectively. Patient states goal for today is to "get meds regulated". Patient attended but minimally participated in groups. He visible in the milieu playing chess. Patient currently denies SI/HI/AVH.   Action:Patient is educated about and provided medication per provider's orders. Patient safety maintained with q15 min safety checks and frequent rounding. Lowfall risk precautions in place. Emotional support given. 1:1 interaction and active listening provided. Patient encouraged to attend meals, groups, and work on treatment plan and goals. Labs, vital signs and patient behavior monitored throughout shift.   Response: Patient remains safe on the unit at this time and agrees to come to staff with any issues/concerns. Patient is interacting with peers appropriately on the unit. Will continue to support and monitor.

## 2018-09-14 NOTE — [facility] Group Notes (Signed)
Pt was invited but did not attend orientation/goals/psycho-ed group.

## 2018-09-14 NOTE — [facility] Group Notes (Signed)
[facility] LCSW Group Therapy Note  09/14/2018  9:00-10:00AM  Type of Therapy and Topic:  Group Therapy:  Being Your Own Support  Participation Level:  Did Not Attend   Description of Group:  Patients in this group were introduced to the concept that self-support is an essential part of recovery.  A song entitled "My Own Hero" was played and a group discussion ensued in which patients stated they could relate to the song and it inspired them to realize they have be willing to help themselves in order to succeed, because other people cannot achieve sobriety or stability for them.  We discussed adding a variety of healthy supports to address the various needs in their lives.  A song was played called "I Know Where I've Been" toward the end of group and used to conduct an inspirational wrap-up to group of remembering how far they have already come in their journey.  Therapeutic Goals: 1)  demonstrate the importance of being a part of one's own support system 2)  discuss reasons people in one's life may eventually be unable to be continually supportive  3)  identify the patient's current support system and   4)  elicit commitments to add healthy supports and to become more conscious of being self-supportive   Summary of Patient Progress:  N/A   Therapeutic Modalities:   Motivational Interviewing Activity  Lynnell ChadMareida J Grossman-Orr

## 2018-09-15 MED ORDER — HYDROXYZINE HCL 25 MG PO TABS: 25.0000 mg | ORAL_TABLET | Freq: Four times a day (QID) | ORAL | 0 refills | Status: DC | PRN

## 2018-09-15 MED ORDER — TRAZODONE HCL 50 MG PO TABS: 50.0000 mg | ORAL_TABLET | Freq: Every evening | ORAL | 0 refills | Status: DC | PRN

## 2018-09-15 MED ORDER — ARIPIPRAZOLE 5 MG PO TABS: 5.0000 mg | ORAL_TABLET | Freq: Every day | ORAL | 0 refills | Status: DC

## 2018-09-15 MED ORDER — BUPROPION HCL ER (XL) 150 MG PO TB24: 150.0000 mg | ORAL_TABLET | Freq: Every day | ORAL | 0 refills | Status: DC

## 2018-09-15 MED ORDER — MIRTAZAPINE 7.5 MG PO TABS: 7.5000 mg | ORAL_TABLET | Freq: Every day | ORAL | 0 refills | Status: DC

## 2018-09-15 NOTE — Progress Notes (Signed)
  [facility]  Will you be returning to the same living situation after discharge:  Yes,  own residence At discharge, do you have transportation home?: No. Bus pass provided.  Do you have the ability to pay for your medications: Yes,  medicaid  Release of information consent forms completed and in the chart;  Patient's signature needed at discharge.  Patient to Follow up at: Follow-up Information    [facility]. Go on 09/17/2018.   Specialty:  Behavioral Health Why:  Please attend your appt on Wednesday, 09/17/18, at 8:00am.   Contact information: [address] [place] [address] [phone]           Next level of care provider has access to [facility] Link:no  Safety Planning and Suicide Prevention discussed: No. Pt declined consent.  SPE completed with pt.   Have you used any form of tobacco in the last 30 days? (Cigarettes, Smokeless Tobacco, Cigars, and/or Pipes): Yes  Has patient been referred to the Quitline?: Patient refused referral  Patient has been referred for addiction treatment: Pt. refused referral  Lorri FrederickWierda, Derric Jon, LCSW 09/15/2018, 2:48 PM

## 2018-09-15 NOTE — Progress Notes (Signed)
Data:At 1126, patient in bed and refusing medications/interactions. Patient complaint with medications, on his schedule. Patient denies pain/physical complaints. Around 1200, patient requested and received order for discharge. Patient attended group after lunch. Patient currently denies SI/HI/AVH.   Action:Patient is educated about and provided medication per provider's orders. Patient safety maintained with q15 min safety checks and frequent rounding. Lowfall risk precautions in place. Emotional support given. 1:1 interaction and active listening offered. Patient encouraged to attend meals, groups, and work on treatment plan and goals. Labs, vital signs and patient behavior monitored throughout shift.   Response: Patient remains safe on the unit at this time and agrees to come to staff with any issues/concerns. Patient is interacting with peers appropriately on the unit. Will continue to support and monitor.

## 2018-09-15 NOTE — Plan of Care (Signed)
  Problem: Safety: Goal: Periods of time without injury will increase Outcome: Progressing  Patient is safe in his room.

## 2018-09-15 NOTE — Progress Notes (Signed)
Recreation Therapy Notes  Date: 9.23.19 Time: 0930 Location: [address] Dayroom  Group Topic: Stress Management  Goal Area(s) Addresses:  Patient will verbalize importance of using healthy stress management.  Patient will identify positive emotions associated with healthy stress management.   Intervention: Stress Management  Activity :  Guided Imagery.  LRT introduced the stress management technique of guided imagery.  LRT read a script to guide patients on a journey to their peaceful place.  Patients were to follow along as script was read.  Education:  Stress Management, Discharge Planning.   Education Outcome: Acknowledges edcuation/In group clarification offered/Needs additional education  Clinical Observations/Feedback: Pt did not attend group.      Agustus Mane, LRT/CTRS         Satonya Lux A 09/15/2018 12:15 PM 

## 2018-09-15 NOTE — [facility] Group Notes (Signed)
[facility] LCSW Group Therapy Note  Date/Time: 09/15/18, 1315  Type of Therapy and Topic:  Group Therapy:  Overcoming Obstacles  Participation Level:  minimal  Description of Group:    In this group patients will be encouraged to explore what they see as obstacles to their own wellness and recovery. They will be guided to discuss their thoughts, feelings, and behaviors related to these obstacles. The group will process together ways to cope with barriers, with attention given to specific choices patients can make. Each patient will be challenged to identify changes they are motivated to make in order to overcome their obstacles. This group will be process-oriented, with patients participating in exploration of their own experiences as well as giving and receiving support and challenge from other group members.  Therapeutic Goals: 1. Patient will identify personal and current obstacles as they relate to admission. 2. Patient will identify barriers that currently interfere with their wellness or overcoming obstacles.  3. Patient will identify feelings, thought process and behaviors related to these barriers. 4. Patient will identify two changes they are willing to make to overcome these obstacles:    Summary of Patient Progress: Pt shared that grief/loss is his current obstacle, but did not participate in the group discussion.      Therapeutic Modalities:   Cognitive Behavioral Therapy Solution Focused Therapy Motivational Interviewing Relapse Prevention Therapy  Daleen SquibbGreg Margot Oriordan, LCSW

## 2018-09-15 NOTE — [facility] Suicide Risk Assessment (Signed)
[facility] Discharge Suicide Risk Assessment   Principal Problem: <principal problem not specified> Discharge Diagnoses:  Patient Active Problem List   Diagnosis Date Noted  . MDD (major depressive disorder), recurrent episode, severe (HCC) [F33.2] 09/10/2018  . Moderate bipolar I disorder, most recent episode depressed (HCC) [F31.32] 07/07/2018  . Homicidal ideation [R45.850]   . Alcohol abuse with alcohol-induced mood disorder (HCC) [F10.14] 03/29/2018  . Cannabis use disorder, mild, abuse [F12.10] 02/12/2018  . Bipolar I disorder (HCC) [F31.9] 02/11/2018  . MDD (major depressive disorder) [F32.9] 12/11/2017  . Intentional overdose of drug in tablet form (HCC) [T50.902A] 11/08/2017  . Dyslipidemia [E78.5] 11/08/2017  . Constipation [K59.00] 11/08/2017  . Drug overdose [T50.901A] 11/05/2017  . Acute alcoholic intoxication without complication (HCC) [F10.920]   . Suicidal ideation [R45.851]   . Alcohol intoxication (HCC) [F10.929] 10/22/2017  . Alcohol withdrawal (HCC) [F10.239] 10/22/2017  . Polysubstance abuse (HCC) [F19.10]   . 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 Cocaine abuse with cocaine-induced mood disorder (HCC) [F14.14] 04/01/2017  . HTN (hypertension) [I10] 06/20/2016  . Tobacco use disorder [F17.200] 06/20/2016  . Trauma [T14.90XA] 07/27/2015  . Cannabis use disorder, moderate, dependence (HCC) [F12.20]   . PTSD (post-traumatic stress disorder) [F43.10]   . Substance induced mood disorder (HCC) [F19.94] 07/07/2014  . Alcohol use disorder, moderate, dependence (HCC) [F10.20] 04/10/2012  . Suicidal thoughts [R45.851] 04/04/2012    Total Time spent with patient: 15 minutes  Musculoskeletal: Strength & Muscle Tone: within normal limits Gait & Station: normal Patient leans: N/A  Psychiatric Specialty Exam: Review of Systems  All other systems reviewed and are negative.   Blood pressure (!) 116/97, pulse 87,  temperature 98.6 F (37 C), temperature source Oral, resp. rate (!) 24, height 5\' 10"  (1.778 m), weight 78.9 kg, SpO2 100 %.Body mass index is 24.97 kg/m.  General Appearance: Casual  Eye Contact::  Good  Speech:  Normal Rate409  Volume:  Normal  Mood:  Euthymic  Affect:  Appropriate  Thought Process:  Coherent and Descriptions of Associations: Intact  Orientation:  Full (Time, Place, and Person)  Thought Content:  Logical  Suicidal Thoughts:  No  Homicidal Thoughts:  No  Memory:  Immediate;   Fair Recent;   Fair Remote;   Fair  Judgement:  Intact  Insight:  Fair  Psychomotor Activity:  Normal  Concentration:  Negative  Recall:  [employer] of Knowledge:Fair  Language: Fair  Akathisia:  Yes  Handed:  Right  AIMS (if indicated):     Assets:  Desire for Improvement Housing Physical Health Resilience Social Support Talents/Skills  Sleep:  Number of Hours: 5.25  Cognition: WNL  ADL's:  Intact   Mental Status Per Nursing Assessment::   On Admission:  Suicidal ideation indicated by patient, Self-harm thoughts  Demographic Factors:  Divorced or widowed, Caucasian and Low socioeconomic status  Loss Factors: NA  Historical Factors: Impulsivity  Risk Reduction Factors:   Employed, Living with another person, especially a relative and Positive coping skills or problem solving skills  Continued Clinical Symptoms:  Bipolar Disorder:   Depressive phase Alcohol/Substance Abuse/Dependencies  Cognitive Features That Contribute To Risk:  None    Suicide Risk:  Minimal: No identifiable suicidal ideation.  Patients presenting with no risk factors but with morbid ruminations; may be classified as minimal risk based on the severity of the depressive symptoms  Follow-up Information    [facility] Go on 09/17/2018.   Specialty:  Behavioral Health Why:  Please attend your appt on Wednesday, 09/17/18, at 8:00am.   Contact information: [address] [place]  [id number] [phone]           Plan Of Care/Follow-up recommendations:  Activity:  ad lib  Antonieta PertGreg Lawson Emila Steinhauser, MD 09/15/2018, 12:12 PM

## 2018-09-15 NOTE — Discharge Summary (Signed)
Physician Discharge Summary Note  Patient:  Chase Moss is an 52 y.o., male MRN:  [id number] DOB:  August 21, 1966 Patient phone:  [phone] (home)  Patient address:   [address] [place] [address],  Total Time spent with patient: 20 minutes  Date of Admission:  09/10/2018 Date of Discharge: 09/15/2018  Reason for Admission: Per Assessment note: 52 year male, known to our unit from prior admissions, most recently in July of 2019. At the time he presented for depression, suicidal ideations, triggered in part by homelessness and substance abuse . He has been diagnosed with Bipolar Disorder, PTSD, Alcohol, Cocaine and Cannabis Use Disorder .He reports he was doing " better for a while" and states he has significantly cut down on alcohol ( currently drinks " a beer a couple of times a week) and on cocaine , which he has not used x 2 months. He does continue to smoke cannabis regularly. Patient presented to ED due to worsening depression, suicidal ideations, social isolation, states he had been staying in his room for days. On day of admission he impulsively cut self on wrist, which required sutures . He does not endorse specific triggers for worsening depression and states that he is no longer homeless, now living in a rooming house. States " part of it is I am lonely, and after my mother and my sister died I feel nobody really cares ". Endorses neuro-vegetative symptoms of depression as below, denies psychotic symptoms   Principal Problem: Moderate bipolar I disorder, most recent episode depressed [facility]) Discharge Diagnoses: Patient Active Problem List   Diagnosis Date Noted  . MDD (major depressive disorder), recurrent episode, severe (HCC) [F33.2] 09/10/2018  . Moderate bipolar I disorder, most recent episode depressed (HCC) [F31.32] 07/07/2018  . Homicidal ideation [R45.850]   . Alcohol abuse with alcohol-induced mood disorder (HCC) [F10.14] 03/29/2018  . Cannabis use disorder,  mild, abuse [F12.10] 02/12/2018  . Bipolar I disorder (HCC) [F31.9] 02/11/2018  . MDD (major depressive disorder) [F32.9] 12/11/2017  . Intentional overdose of drug in tablet form (HCC) [T50.902A] 11/08/2017  . Dyslipidemia [E78.5] 11/08/2017  . Constipation [K59.00] 11/08/2017  . Drug overdose [T50.901A] 11/05/2017  . Acute alcoholic intoxication without complication (HCC) [F10.920]   . Suicidal ideation [R45.851]   . Alcohol intoxication (HCC) [F10.929] 10/22/2017  . Alcohol withdrawal (HCC) [F10.239] 10/22/2017  . Polysubstance abuse (HCC) [F19.10]   . 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 Cocaine abuse with cocaine-induced mood disorder (HCC) [F14.14] 04/01/2017  . HTN (hypertension) [I10] 06/20/2016  . Tobacco use disorder [F17.200] 06/20/2016  . Trauma [T14.90XA] 07/27/2015  . Cannabis use disorder, moderate, dependence (HCC) [F12.20]   . PTSD (post-traumatic stress disorder) [F43.10]   . Substance induced mood disorder (HCC) [F19.94] 07/07/2014  . Alcohol use disorder, moderate, dependence (HCC) [F10.20] 04/10/2012  . Suicidal thoughts [R45.851] 04/04/2012    Past Psychiatric History:   Past Medical History:  Past Medical History:  Diagnosis Date  . Bipolar 1 disorder (HCC)   . Depression   . Hypertension   . PTSD (post-traumatic stress disorder)    History reviewed. No pertinent surgical history. Family History:  Family History  Problem Relation Age of Onset  . Heart attack Other    Family Psychiatric  History:  Social History:  Social History   Substance and Sexual Activity  Alcohol Use Yes  . Alcohol/week: 2.0 standard drinks  . Types: 2 Cans of beer per week   Comment: Pt states "  I've only drank (1) 40 in 3 months"      Social History   Substance and Sexual Activity  Drug Use Yes  . Types: Cocaine, Marijuana    Social History   Socioeconomic History  . Marital status: Divorced    Spouse  name: Not on file  . Number of children: Not on file  . Years of education: Not on file  . Highest education level: Not on file  Occupational History  . Not on file  Social Needs  . Financial resource strain: Not on file  . Food insecurity:    Worry: Not on file    Inability: Not on file  . Transportation needs:    Medical: Not on file    Non-medical: Not on file  Tobacco Use  . Smoking status: Current Every Day Smoker    Packs/day: 1.00    Years: 30.00    Pack years: 30.00    Types: Cigarettes  . Smokeless tobacco: Never Used  Substance and Sexual Activity  . Alcohol use: Yes    Alcohol/week: 2.0 standard drinks    Types: 2 Cans of beer per week    Comment: Pt states "I've only drank (1) 40 in 3 months"   . Drug use: Yes    Types: Cocaine, Marijuana  . Sexual activity: Yes  Lifestyle  . Physical activity:    Days per week: Not on file    Minutes per session: Not on file  . Stress: Not on file  Relationships  . Social connections:    Talks on phone: Not on file    Gets together: Not on file    Attends religious service: Not on file    Active member of club or organization: Not on file    Attends meetings of clubs or organizations: Not on file    Relationship status: Not on file  Other Topics Concern  . Not on file  Social History Narrative  . Not on file    Hospital Course:  Chase Moss was admitted for Moderate bipolar I disorder, most recent episode depressed (HCC) and crisis management.  Pt was treated discharged with the medications listed below under Medication List.  Medical problems were identified and treated as needed.  Home medications were restarted as appropriate.  Improvement was monitored by observation and Chase Moss 's daily report of symptom reduction.  Emotional and mental status was monitored by daily self-inventory reports completed by Chase Moss and clinical staff.         Chase Moss was evaluated by the treatment team for stability  and plans for continued recovery upon discharge. Chase Moss 's motivation was an integral factor for scheduling further treatment. Employment, transportation, bed availability, health status, family support, and any pending legal issues were also considered during hospital stay. Pt was offered further treatment options upon discharge including but not limited to Residential, Intensive Outpatient, and Outpatient treatment.  Chase DowGregory Rua will follow up with the services as listed below under Follow Up Information.     Upon completion of this admission the patient was both mentally and medically stable for discharge denying suicidal/homicidal ideation, auditory/visual/tactile hallucinations, delusional thoughts and paranoia.     Chase DowGregory Scantlin responded well to treatment with Abilify 5 mg and Wellbutrin 150mg  and trazodone 150 mg  without adverse effects.. Pt demonstrated improvement without reported or observed adverse effects to the point of stability appropriate for outpatient management. Pertinent labs include: for which outpatient follow-up is necessary for lab recheck  as mentioned below. Reviewed CBC, CMP, BAL + THC , and UDS; all unremarkable aside from noted exceptions.   Physical Findings: AIMS: Facial and Oral Movements Muscles of Facial Expression: None, normal Lips and Perioral Area: None, normal Jaw: None, normal Tongue: None, normal,Extremity Movements Upper (arms, wrists, hands, fingers): None, normal Lower (legs, knees, ankles, toes): None, normal, Trunk Movements Neck, shoulders, hips: None, normal, Overall Severity Severity of abnormal movements (highest score from questions above): None, normal Incapacitation due to abnormal movements: None, normal Patient's awareness of abnormal movements (rate only patient's report): No Awareness, Dental Status Current problems with teeth and/or dentures?: No Does patient usually wear dentures?: No  CIWA:    COWS:      Musculoskeletal: Strength & Muscle Tone: within normal limits Gait & Station: normal Patient leans: N/A  Psychiatric Specialty Exam: See SRA by MD Physical Exam  Review of Systems  Psychiatric/Behavioral: Positive for depression (improving ). Negative for suicidal ideas. The patient is not nervous/anxious.   All other systems reviewed and are negative.   Blood pressure (!) 116/97, pulse 87, temperature 98.6 F (37 C), temperature source Oral, resp. rate (!) 24, height 5\' 10"  (1.778 m), weight 78.9 kg, SpO2 100 %.Body mass index is 24.97 kg/m.  Have you used any form of tobacco in the last 30 days? (Cigarettes, Smokeless Tobacco, Cigars, and/or Pipes): Yes  Has this patient used any form of tobacco in the last 30 days? (Cigarettes, Smokeless Tobacco, Cigars, and/or Pipes) Yes, No  Blood Alcohol level:  Lab Results  Component Value Date   ETH <10 09/09/2018   ETH 19 (H) 07/06/2018    Metabolic Disorder Labs:  Lab Results  Component Value Date   HGBA1C 5.4 07/16/2018   MPG 108.28 07/16/2018   MPG 100 04/09/2012   Lab Results  Component Value Date   PROLACTIN 21.0 (H) 06/21/2016   Lab Results  Component Value Date   CHOL 260 (H) 07/16/2018   TRIG 241 (H) 07/16/2018   HDL 59 07/16/2018   CHOLHDL 4.4 07/16/2018   VLDL 48 (H) 07/16/2018   LDLCALC 153 (H) 07/16/2018   LDLCALC 84 11/07/2017    See Psychiatric Specialty Exam and Suicide Risk Assessment completed by Attending Physician prior to discharge.  Discharge destination:  Home  Is patient on multiple antipsychotic therapies at discharge:  No   Has Patient had three or more failed trials of antipsychotic monotherapy by history:  No  Recommended Plan for Multiple Antipsychotic Therapies: NA  Discharge Instructions    Diet - low sodium heart healthy   Complete by:  As directed    Discharge instructions   Complete by:  As directed    Take all medications as prescribed. Keep all follow-up appointments as  scheduled.  Do not consume alcohol or use illegal drugs while on prescription medications. Report any adverse effects from your medications to your primary care provider promptly.  In the event of recurrent symptoms or worsening symptoms, call 911, a crisis hotline, or go to the nearest emergency department for evaluation.   Increase activity slowly   Complete by:  As directed      Allergies as of 09/15/2018   No Known Allergies     Medication List    STOP taking these medications   gabapentin 300 MG capsule Commonly known as:  NEURONTIN   nicotine 21 mg/24hr patch Commonly known as:  NICODERM CQ - dosed in mg/24 hours     TAKE these medications  Indication  amLODipine 5 MG tablet Commonly known as:  NORVASC Take 1 tablet (5 mg total) by mouth daily. For high blood pressure  Indication:  High Blood Pressure Disorder   ARIPiprazole 5 MG tablet Commonly known as:  ABILIFY Take 1 tablet (5 mg total) by mouth daily. Start taking on:  09/16/2018 What changed:    medication strength  how much to take  additional instructions  Indication:  Major Depressive Disorder   buPROPion 150 MG 24 hr tablet Commonly known as:  WELLBUTRIN XL Take 1 tablet (150 mg total) by mouth daily. Start taking on:  09/16/2018 What changed:    medication strength  how much to take  additional instructions  Indication:  Major Depressive Disorder   hydrOXYzine 25 MG tablet Commonly known as:  ATARAX/VISTARIL Take 1 tablet (25 mg total) by mouth every 6 (six) hours as needed (anxiety). What changed:    medication strength  how much to take  when to take this  reasons to take this  Indication:  Feeling Anxious   lisinopril 10 MG tablet Commonly known as:  PRINIVIL,ZESTRIL Take 1 tablet (10 mg total) by mouth daily. For high blood pressure  Indication:  High Blood Pressure Disorder   mirtazapine 7.5 MG tablet Commonly known as:  REMERON Take 1 tablet (7.5 mg total) by mouth at  bedtime. What changed:  additional instructions  Indication:  Major Depressive Disorder   pantoprazole 40 MG tablet Commonly known as:  PROTONIX Take 1 tablet (40 mg total) by mouth daily. For acid reflux  Indication:  Gastroesophageal Reflux Disease   prazosin 2 MG capsule Commonly known as:  MINIPRESS Take 1 capsule (2 mg total) by mouth at bedtime. For PTSD nightmares  Indication:  PTSD related nightmares   traZODone 50 MG tablet Commonly known as:  DESYREL Take 1 tablet (50 mg total) by mouth at bedtime as needed for sleep. What changed:    medication strength  how much to take  additional instructions  Indication:  Trouble Sleeping      Follow-up Information    [facility] Go on 09/17/2018.   Specialty:  Behavioral Health Why:  Please attend your appt on Wednesday, 09/17/18, at 8:00am.   Contact information: [address] [place] [address] [phone]           Follow-up recommendations:  Activity:  as tolerated Diet:  heart healthy  Comments:  Take all medications as prescribed. Keep all follow-up appointments as scheduled.  Do not consume alcohol or use illegal drugs while on prescription medications. Report any adverse effects from your medications to your primary care provider promptly.  In the event of recurrent symptoms or worsening symptoms, call 911, a crisis hotline, or go to the nearest emergency department for evaluation.   Signed: Oneta Rack, NP 09/15/2018, 1:12 PM

## 2018-09-15 NOTE — Progress Notes (Signed)
Pt did not attend wrap-up group   

## 2018-09-15 NOTE — Progress Notes (Addendum)
Patient ID: Chase Moss, male   DOB: 05/10/1966, 52 y.o.   MRN: [id number]  Discharge Note  D) Patient discharged to lobby. Patient states readiness for discharge. Patient denies SI/HI, AVH and is not delusional or psychotic. Patient in no acute distress. Patient has completed their Suicide Safety Plan. Patient provided an opportunity to complete and return Patient Satisfaction Survey.   A) Written and verbal discharge instructions given to the patient. Patient accepting to information and verbalized understanding. Patient agrees to the discharge plan. Opportunity for questions and concerns presented to patient. Patient denied any further questions or concerns. All belongings returned to patient. Patient signed for return of belongings and discharge paperwork. Patient provided a copy of their Suicide Safety Plan and has been provided a copy of the Patient Satisfaction Survey with return instructions.  R) Patient safely escorted to the lobby. [facility] belongings, SW note, follow-up appointment in place and discharge paperwork.

## 2018-09-15 NOTE — [facility] Group Notes (Signed)
Pt was invited but did not attend orientation/goals group. 

## 2018-09-15 NOTE — Progress Notes (Signed)
Patient ID: Chase Moss, male   DOB: 04/25/1966, 52 y.o.   MRN: [id number]  Patient's L wrist stitches were removed by MD. Site looks well approximated and w/healthy tissue. No s/s of infection. Neosporin and steri strips applied.

## 2018-10-06 ENCOUNTER — Encounter (HOSPITAL_COMMUNITY): Payer: Self-pay | Admitting: *Deleted

## 2018-10-06 ENCOUNTER — Emergency Department (HOSPITAL_COMMUNITY): Payer: Medicaid Other

## 2018-10-06 ENCOUNTER — Other Ambulatory Visit: Payer: Self-pay

## 2018-10-06 ENCOUNTER — Emergency Department (HOSPITAL_COMMUNITY)
Admission: EM | Admit: 2018-10-06 | Discharge: 2018-10-07 | Disposition: A | Discharge status: Home / Self Care | Payer: Medicaid Other | Attending: Emergency Medicine | Admitting: Emergency Medicine

## 2018-10-06 DIAGNOSIS — F17210 Nicotine dependence, cigarettes, uncomplicated: Secondary | ICD-10-CM | POA: Diagnosis not present

## 2018-10-06 DIAGNOSIS — R45851 Suicidal ideations: Secondary | ICD-10-CM | POA: Insufficient documentation

## 2018-10-06 DIAGNOSIS — F319 Bipolar disorder, unspecified: Secondary | ICD-10-CM | POA: Diagnosis not present

## 2018-10-06 DIAGNOSIS — I10 Essential (primary) hypertension: Secondary | ICD-10-CM | POA: Insufficient documentation

## 2018-10-06 DIAGNOSIS — R45850 Homicidal ideations: Secondary | ICD-10-CM | POA: Diagnosis not present

## 2018-10-06 DIAGNOSIS — R109 Unspecified abdominal pain: Secondary | ICD-10-CM | POA: Diagnosis present

## 2018-10-06 DIAGNOSIS — F332 Major depressive disorder, recurrent severe without psychotic features: Secondary | ICD-10-CM | POA: Insufficient documentation

## 2018-10-06 DIAGNOSIS — F3132 Bipolar disorder, current episode depressed, moderate: Secondary | ICD-10-CM | POA: Diagnosis present

## 2018-10-06 DIAGNOSIS — R112 Nausea with vomiting, unspecified: Secondary | ICD-10-CM

## 2018-10-06 DIAGNOSIS — R1084 Generalized abdominal pain: Secondary | ICD-10-CM

## 2018-10-06 LAB — CBC
HCT: 51.5 % (ref 39.0–52.0)
HEMOGLOBIN: 17.5 g/dL — AB (ref 13.0–17.0)
MCH: 29.7 pg (ref 26.0–34.0)
MCHC: 34 g/dL (ref 30.0–36.0)
MCV: 87.3 fL (ref 80.0–100.0)
NRBC: 0 % (ref 0.0–0.2)
PLATELETS: 249 10*3/uL (ref 150–400)
RBC: 5.9 MIL/uL — AB (ref 4.22–5.81)
RDW: 13.2 % (ref 11.5–15.5)
WBC: 10.6 10*3/uL — ABNORMAL HIGH (ref 4.0–10.5)

## 2018-10-06 LAB — COMPREHENSIVE METABOLIC PANEL
ALBUMIN: 4.8 g/dL (ref 3.5–5.0)
ALT: 18 U/L (ref 0–44)
AST: 21 U/L (ref 15–41)
Alkaline Phosphatase: 87 U/L (ref 38–126)
Anion gap: 14 (ref 5–15)
BUN: 17 mg/dL (ref 6–20)
CALCIUM: 10.1 mg/dL (ref 8.9–10.3)
CHLORIDE: 100 mmol/L (ref 98–111)
CO2: 24 mmol/L (ref 22–32)
CREATININE: 1.17 mg/dL (ref 0.61–1.24)
GFR calc non Af Amer: >60 mL/min (ref >60)
GLUCOSE: 130 mg/dL — AB (ref 70–99)
Potassium: 3.7 mmol/L (ref 3.5–5.1)
SODIUM: 138 mmol/L (ref 135–145)
Total Bilirubin: 1.6 mg/dL — ABNORMAL HIGH (ref 0.3–1.2)
Total Protein: 8.1 g/dL (ref 6.5–8.1)

## 2018-10-06 LAB — SALICYLATE LEVEL: Salicylate Lvl: <7 mg/dL (ref 2.8–30.0)

## 2018-10-06 LAB — ETHANOL: Alcohol, Ethyl (B): <10 mg/dL (ref <10)

## 2018-10-06 LAB — ACETAMINOPHEN LEVEL: Acetaminophen (Tylenol), Serum: <10 ug/mL — ABNORMAL LOW (ref 10–30)

## 2018-10-06 LAB — LIPASE, BLOOD: Lipase: 31 U/L (ref 11–51)

## 2018-10-06 MED ORDER — MIRTAZAPINE 7.5 MG PO TABS
7.5000 mg | ORAL_TABLET | Freq: Every day | ORAL | Status: DC
  Administered 2018-10-06: 7.5 mg via ORAL
  Filled 2018-10-06: qty 1

## 2018-10-06 MED ORDER — TRAZODONE HCL 50 MG PO TABS: 50.0000 mg | ORAL_TABLET | Freq: Every evening | ORAL | Status: DC | PRN

## 2018-10-06 MED ORDER — ARIPIPRAZOLE 5 MG PO TABS
5.0000 mg | ORAL_TABLET | Freq: Every day | ORAL | Status: DC
  Administered 2018-10-07: 5 mg via ORAL
  Filled 2018-10-06: qty 1

## 2018-10-06 MED ORDER — AMLODIPINE BESYLATE 5 MG PO TABS
5.0000 mg | ORAL_TABLET | Freq: Every day | ORAL | Status: DC
  Administered 2018-10-07: 5 mg via ORAL
  Filled 2018-10-06: qty 1

## 2018-10-06 MED ORDER — ONDANSETRON 4 MG PO TBDP
4.0000 mg | ORAL_TABLET | Freq: Once | ORAL | Status: AC | PRN
  Administered 2018-10-06: 4 mg via ORAL
  Filled 2018-10-06: qty 1

## 2018-10-06 MED ORDER — HYDROXYZINE HCL 25 MG PO TABS: 25.0000 mg | ORAL_TABLET | Freq: Four times a day (QID) | ORAL | Status: DC | PRN

## 2018-10-06 MED ORDER — PANTOPRAZOLE SODIUM 40 MG PO TBEC
40.0000 mg | DELAYED_RELEASE_TABLET | Freq: Every day | ORAL | Status: DC
  Administered 2018-10-07: 40 mg via ORAL
  Filled 2018-10-06: qty 1

## 2018-10-06 MED ORDER — LISINOPRIL 10 MG PO TABS
10.0000 mg | ORAL_TABLET | Freq: Every day | ORAL | Status: DC
  Administered 2018-10-07: 10 mg via ORAL
  Filled 2018-10-06: qty 1

## 2018-10-06 MED ORDER — BUPROPION HCL ER (XL) 150 MG PO TB24
150.0000 mg | ORAL_TABLET | Freq: Every day | ORAL | Status: DC
  Administered 2018-10-07: 150 mg via ORAL
  Filled 2018-10-06: qty 1

## 2018-10-06 MED ORDER — PRAZOSIN HCL 1 MG PO CAPS
2.0000 mg | ORAL_CAPSULE | Freq: Every day | ORAL | Status: DC
  Administered 2018-10-06: 2 mg via ORAL
  Filled 2018-10-06: qty 2

## 2018-10-06 MED ORDER — GI COCKTAIL ~~LOC~~
30.0000 mL | Freq: Once | ORAL | Status: AC
  Administered 2018-10-06: 30 mL via ORAL
  Filled 2018-10-06 (×2): qty 30

## 2018-10-06 NOTE — ED Provider Notes (Addendum)
Emergency Department Provider Note   I have reviewed the triage vital signs and the nursing notes.   HISTORY  Chief Complaint Suicidal; Homicidal; and Nausea   HPI Chase Moss is a 52 y.o. male with PMH of Bipolar disorder, HTN, and and PTSD to the emergency department with abdominal pain, nausea/vomiting along with suicidal and homicidal ideation.  Police arrived to the [employer] where the patient's friend had called.  The patient was endorsing suicidal thinking and apparently standing next to a razor blade.  Police states that he told him he had put it to the side so that he would not harm himself and that he is feeling increasingly impulsive.  Patient also had some feelings of harming his mother.  Police took out IVC paperwork and presented to the emergency department.   Patient states that he has also had abdominal pain for the past 2 days along with vomiting.  He states his abdominal pain feels like a diffuse, cramping pain.  No focal or unilateral pain symptoms.  No fevers or chills.  No dysuria, hesitancy, urgency.  No diarrhea.  He does not drink alcohol since March of this year.  He uses marijuana occasionally.   Past Medical History:  Diagnosis Date  . Bipolar 1 disorder (HCC)   . Depression   . Hypertension   . PTSD (post-traumatic stress disorder)     Patient Active Problem List   Diagnosis Date Noted  . MDD (major depressive disorder), recurrent episode, severe (HCC) 09/10/2018  . Moderate bipolar I disorder, most recent episode depressed (HCC) 07/07/2018  . Homicidal ideation   . Alcohol abuse with alcohol-induced mood disorder (HCC) 03/29/2018  . Cannabis use disorder, mild, abuse 02/12/2018  . Bipolar I disorder (HCC) 02/11/2018  . MDD (major depressive disorder) 12/11/2017  . Intentional overdose of drug in tablet form (HCC) 11/08/2017  . Dyslipidemia 11/08/2017  . Constipation 11/08/2017  . Drug overdose 11/05/2017  . Acute alcoholic intoxication  without complication (HCC)   . Suicidal ideation   . Alcohol intoxication (HCC) 10/22/2017  . Alcohol withdrawal (HCC) 10/22/2017  . Polysubstance abuse (HCC)   . Major depressive disorder, recurrent, severe with psychotic features (HCC) 05/15/2017  . Cocaine abuse with cocaine-induced mood disorder (HCC) 04/01/2017  . HTN (hypertension) 06/20/2016  . Tobacco use disorder 06/20/2016  . Trauma 07/27/2015  . Cannabis use disorder, moderate, dependence (HCC)   . PTSD (post-traumatic stress disorder)   . Substance induced mood disorder (HCC) 07/07/2014  . Alcohol use disorder, moderate, dependence (HCC) 04/10/2012  . Suicidal thoughts 04/04/2012    History reviewed. No pertinent surgical history.  Allergies Patient has no known allergies.  Family History  Problem Relation Age of Onset  . Heart attack Other     Social History Social History   Tobacco Use  . Smoking status: Current Every Day Smoker    Packs/day: 1.00    Years: 30.00    Pack years: 30.00    Types: Cigarettes  . Smokeless tobacco: Never Used  Substance Use Topics  . Alcohol use: Yes    Alcohol/week: 2.0 standard drinks    Types: 2 Cans of beer per week    Comment: Pt states "I've only drank (1) 40 in 3 months"   . Drug use: Yes    Types: Marijuana    Review of Systems  Constitutional: No fever/chills Eyes: No visual changes. ENT: No sore throat. Cardiovascular: Denies chest pain. Respiratory: Denies shortness of breath. Gastrointestinal: Positive abdominal pain. Positive  nausea and vomiting.  No diarrhea.  No constipation. Genitourinary: Negative for dysuria. Musculoskeletal: Negative for back pain. Skin: Negative for rash. Neurological: Negative for headaches, focal weakness or numbness. Psychiatric: Patient with SI/HI.   10-point ROS otherwise negative.  ____________________________________________   PHYSICAL EXAM:  VITAL SIGNS: ED Triage Vitals  Enc Vitals Group     BP 10/06/18 2136  (!) 184/105     Pulse Rate 10/06/18 2137 64     Resp --      Temp 10/06/18 2137 97.8 F (36.6 C)     Temp Source 10/06/18 2137 Oral     SpO2 10/06/18 2137 100 %     Weight 10/06/18 2140 173 lb (78.5 kg)     Height 10/06/18 2140 5\' 10"  (1.778 m)     Pain Score 10/06/18 2137 8   Constitutional: Alert and oriented. Well appearing and in no acute distress. Eyes: Conjunctivae are normal.  Head: Atraumatic. Nose: No congestion/rhinnorhea. Mouth/Throat: Mucous membranes are moist.  Oropharynx non-erythematous. Neck: No stridor.  Cardiovascular: Normal rate, regular rhythm. Good peripheral circulation. Grossly normal heart sounds.   Respiratory: Normal respiratory effort.  No retractions. Lungs CTAB. Gastrointestinal: Soft with mild diffuse tenderness. No focal tenderness. No distention.  Musculoskeletal: No lower extremity tenderness nor edema. No gross deformities of extremities. Neurologic:  Normal speech and language. No gross focal neurologic deficits are appreciated.  Skin:  Skin is warm, dry and intact. No rash noted. Psychiatric: Mood and affect are normal. Speech and behavior are normal.  ____________________________________________   LABS (all labs ordered are listed, but only abnormal results are displayed)  Labs Reviewed  COMPREHENSIVE METABOLIC PANEL - Abnormal; Notable for the following components:      Result Value   Glucose, Bld 130 (*)    Total Bilirubin 1.6 (*)    All other components within normal limits  ACETAMINOPHEN LEVEL - Abnormal; Notable for the following components:   Acetaminophen (Tylenol), Serum <10 (*)    All other components within normal limits  CBC - Abnormal; Notable for the following components:   WBC 10.6 (*)    RBC 5.90 (*)    Hemoglobin 17.5 (*)    All other components within normal limits  ETHANOL  SALICYLATE LEVEL  LIPASE, BLOOD  RAPID URINE DRUG SCREEN, HOSP PERFORMED   ____________________________________________  RADIOLOGY  Dg  Abdomen Acute W/chest  Result Date: 10/06/2018 CLINICAL DATA:  Mid abdominal pain and vomiting for 48 hours. EXAM: DG ABDOMEN ACUTE W/ 1V CHEST COMPARISON:  CT 10/22/2017 FINDINGS: Nonobstructed, nondistended bowel gas pattern. Scattered air containing small large bowel loops are present. There is no free air. No radiopaque calculi or other significant radiographic abnormality is seen. Heart size and mediastinal contours are within normal limits. Both lungs are clear. IMPRESSION: Nonobstructive bowel gas pattern. No free air noted. No acute cardiopulmonary disease. Electronically Signed   By: Tollie Eth M.D.   On: 10/06/2018 22:52     EKG:    EKG Interpretation  Date/Time:  Monday October 06 2018 23:13:21 EDT Ventricular Rate:  62 PR Interval:  128 QRS Duration: 100 QT Interval:  458 QTC Calculation: 464 R Axis:   78 Text Interpretation:  Normal sinus rhythm Normal ECG No STEMI.  Confirmed by Alona Bene [phone]) on 10/06/2018 11:29:48 PM       ____________________________________________   PROCEDURES  Procedure(s) performed:   Procedures  None ____________________________________________   INITIAL IMPRESSION / ASSESSMENT AND PLAN / ED COURSE  Pertinent labs & imaging results  that were available during my care of the patient were reviewed by me and considered in my medical decision making (see chart for details).  Patient presents to the emergency department with abdominal pain, nausea, suicidal/homicidal ideation.  He is here under IVC from the police after they were called to the [employer].  Patient is calm and cooperative with me here.  I will uphold the IVC given least told me about feeling like harming himself and his mother.  His abdomen is significant for diffuse, mild tenderness.  No rebound or guarding. Patient feeling better after Zofran. Plan for acute abdomen x-ray, labs, and medical clearance.   11:22 PM Labs reviewed.  No acute findings.  Patient  feeling better after Zofran and GI cocktail.  Plain film with no acute findings.  Patient is medically cleared.  I have restarted his medications including blood pressure medication.  I do not find any evidence of hypertensive emergency which require further acute medical management or admission.    ____________________________________________  FINAL CLINICAL IMPRESSION(S) / ED DIAGNOSES  Final diagnoses:  Suicidal ideations  Homicidal ideation  Generalized abdominal pain  Non-intractable vomiting with nausea, unspecified vomiting type     MEDICATIONS GIVEN DURING THIS VISIT:  Medications  amLODipine (NORVASC) tablet 5 mg (has no administration in time range)  ARIPiprazole (ABILIFY) tablet 5 mg (has no administration in time range)  buPROPion (WELLBUTRIN XL) 24 hr tablet 150 mg (has no administration in time range)  hydrOXYzine (ATARAX/VISTARIL) tablet 25 mg (has no administration in time range)  lisinopril (PRINIVIL,ZESTRIL) tablet 10 mg (has no administration in time range)  mirtazapine (REMERON) tablet 7.5 mg (has no administration in time range)  pantoprazole (PROTONIX) EC tablet 40 mg (has no administration in time range)  prazosin (MINIPRESS) capsule 2 mg (has no administration in time range)  traZODone (DESYREL) tablet 50 mg (has no administration in time range)  ondansetron (ZOFRAN-ODT) disintegrating tablet 4 mg (4 mg Oral Given 10/06/18 2202)  gi cocktail (Maalox,Lidocaine,Donnatal) (30 mLs Oral Given 10/06/18 2232)     Note:  This document was prepared using Dragon voice recognition software and may include unintentional dictation errors.  Alona Bene, MD Emergency Medicine    Long, Arlyss Repress, MD 10/06/18 2322    Maia Plan, MD 10/06/18 2330

## 2018-10-06 NOTE — ED Notes (Signed)
Bed: WA27 Expected date:  Expected time:  Means of arrival:  Comments: 

## 2018-10-06 NOTE — ED Notes (Signed)
Bed: WA21 Expected date:  Expected time:  Means of arrival:  Comments: Hold for 27
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
## 2018-10-06 NOTE — ED Notes (Signed)
Pt refused SL

## 2018-10-07 LAB — RAPID URINE DRUG SCREEN, HOSP PERFORMED
Amphetamines: NOT DETECTED
Barbiturates: NOT DETECTED
Benzodiazepines: NOT DETECTED
Cocaine: POSITIVE — AB
Opiates: NOT DETECTED
Tetrahydrocannabinol: POSITIVE — AB

## 2018-10-07 MED ORDER — MIRTAZAPINE 7.5 MG PO TABS: 7.5000 mg | ORAL_TABLET | Freq: Every day | ORAL | 0 refills | Status: DC

## 2018-10-07 MED ORDER — HYDROXYZINE HCL 25 MG PO TABS: 25.0000 mg | ORAL_TABLET | Freq: Four times a day (QID) | ORAL | 0 refills | Status: DC | PRN

## 2018-10-07 MED ORDER — AMLODIPINE BESYLATE 5 MG PO TABS: 5.0000 mg | ORAL_TABLET | Freq: Every day | ORAL | 0 refills | Status: DC

## 2018-10-07 MED ORDER — PRAZOSIN HCL 2 MG PO CAPS: 2.0000 mg | ORAL_CAPSULE | Freq: Every day | ORAL | 0 refills | Status: DC

## 2018-10-07 MED ORDER — ARIPIPRAZOLE 5 MG PO TABS: 5.0000 mg | ORAL_TABLET | Freq: Every day | ORAL | 0 refills | Status: DC

## 2018-10-07 MED ORDER — LISINOPRIL 10 MG PO TABS: 10.0000 mg | ORAL_TABLET | Freq: Every day | ORAL | 0 refills | Status: DC

## 2018-10-07 MED ORDER — BUPROPION HCL ER (XL) 150 MG PO TB24: 150.0000 mg | ORAL_TABLET | Freq: Every day | ORAL | 0 refills | Status: DC

## 2018-10-07 MED ORDER — TRAZODONE HCL 50 MG PO TABS: 50.0000 mg | ORAL_TABLET | Freq: Every evening | ORAL | 0 refills | Status: DC | PRN

## 2018-10-07 NOTE — [facility] Suicide Risk Assessment (Cosign Needed)
Suicide Risk Assessment  Discharge Assessment   [facility] Discharge Suicide Risk Assessment   Principal Problem: Moderate bipolar I disorder, most recent episode depressed [facility]) Discharge Diagnoses:  Patient Active Problem List   Diagnosis Date Noted  . MDD (major depressive disorder), recurrent episode, severe (HCC) [F33.2] 09/10/2018  . Moderate bipolar I disorder, most recent episode depressed (HCC) [F31.32] 07/07/2018  . Homicidal ideation [R45.850]   . Alcohol abuse with alcohol-induced mood disorder (HCC) [F10.14] 03/29/2018  . Cannabis use disorder, mild, abuse [F12.10] 02/12/2018  . Bipolar I disorder (HCC) [F31.9] 02/11/2018  . MDD (major depressive disorder) [F32.9] 12/11/2017  . Intentional overdose of drug in tablet form (HCC) [T50.902A] 11/08/2017  . Dyslipidemia [E78.5] 11/08/2017  . Constipation [K59.00] 11/08/2017  . Drug overdose [T50.901A] 11/05/2017  . Acute alcoholic intoxication without complication (HCC) [F10.920]   . Suicidal ideation [R45.851]   . Alcohol intoxication (HCC) [F10.929] 10/22/2017  . Alcohol withdrawal (HCC) [F10.239] 10/22/2017  . Polysubstance abuse (HCC) [F19.10]   . 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 Cocaine abuse with cocaine-induced mood disorder (HCC) [F14.14] 04/01/2017  . HTN (hypertension) [I10] 06/20/2016  . Tobacco use disorder [F17.200] 06/20/2016  . Trauma [T14.90XA] 07/27/2015  . Cannabis use disorder, moderate, dependence (HCC) [F12.20]   . PTSD (post-traumatic stress disorder) [F43.10]   . Substance induced mood disorder (HCC) [F19.94] 07/07/2014  . Alcohol use disorder, moderate, dependence (HCC) [F10.20] 04/10/2012  . Suicidal thoughts [R45.851] 04/04/2012     Total Time spent with patient: 20 minutes  Musculoskeletal: Strength & Muscle Tone: within normal limits Gait & Station: normal Patient leans: N/A  Psychiatric Specialty Exam: Review of Systems   Constitutional: Negative.   HENT: Negative.   Eyes: Negative.   Respiratory: Negative.   Cardiovascular: Negative.   Gastrointestinal: Negative.   Genitourinary: Negative.   Musculoskeletal: Negative.   Skin: Negative.   Psychiatric/Behavioral: Positive for depression and substance abuse. Negative for hallucinations, memory loss and suicidal ideas. The patient is nervous/anxious and has insomnia.   All other systems reviewed and are negative.   Blood pressure 128/84, pulse 68, temperature 97.9 F (36.6 C), temperature source Oral, resp. rate 16, height 5\' 10"  (1.778 m), weight 78.5 kg, SpO2 98 %.Body mass index is 24.82 kg/m.  General Appearance: Disheveled  Eye Solicitor::  Fair  Speech:  Clear and Coherent  Volume:  Normal   Mood:  improved, brightens upon approach  Affect:  Appropriate and Congruent  Thought Process:  Irrelevant, Linear and Descriptions of Associations: Circumstantial  Orientation:  Full (Time, Place, and Person)  Thought Content:  WDL  Suicidal Thoughts:  denies and contracts for safety  Homicidal Thoughts:  Denies  Memory:  Immediate;   Good Recent;   Good Remote;   Good  Judgement:  Fair  Insight:  Good  Psychomotor Activity:  Normal  Concentration:  NA  Recall:  Good  Fund of Knowledge:Good  Language: Good  Akathisia:  No  Handed:  Right  AIMS (if indicated):     Assets:  Desire for Improvement Housing Physical Health Resilience Social Support Talents/Skills  Sleep:     Cognition: WNL  ADL's:  Intact   Mental Status Per Nursing Assessment::   On Admission:   Patient reports seeking services for mental health and needing refills on his medications. He is alert and orientated at this time and is laying in bed. He responds appropriately and engages well with provider. He is open to receiving services and  requesting assistance. He is able to offer some insight and improved judgement.   Demographic Factors:  Male, Caucasian, Low socioeconomic  status, Living alone and Unemployed  Loss Factors: Decrease in vocational status and Financial problems/change in socioeconomic status  Historical Factors: Prior suicide attempts, Family history of mental illness or substance abuse and Impulsivity  Risk Reduction Factors:   Living with another person, especially a relative, Positive social support, Positive therapeutic relationship and Positive coping skills or problem solving skills  Continued Clinical Symptoms:  Depression:   Comorbid alcohol abuse/dependence Impulsivity Alcohol/Substance Abuse/Dependencies  Cognitive Features That Contribute To Risk:  None    Suicide Risk:  Minimal: No identifiable suicidal ideation.  Patients presenting with no risk factors but with morbid ruminations; may be classified as minimal risk based on the severity of the depressive symptoms.    Plan Of Care/Follow-up recommendations:  Activity:  increase activity as tolerated. Other:  Will discharge to [facility] for follow up on Thursday 10/09/2018 at 10am. Samples to be provided until ACT team services assumes care.   Maryagnes Amos, FNP 10/07/2018, 2:12 PM

## 2018-10-07 NOTE — Patient Outreach (Signed)
[facility] met with Pt and was able process with Pt to gain an understanding of what services Pt was seeking. [facility] processed with Pt about ACTT. [facility] made Pt aware that he will be able to gain a strong support system to assist Pt in the community. [facility] talked with Pt to help him understand what services can be provided with the ACTT team. [facility] was able to get him scheduled to meet with [facility] services. [facility] was able to get him set up on Thursday at 12 in the community. CPSS left contact information for Pt to follow up with Pt if needed in the community.

## 2018-10-07 NOTE — BH Assessment (Addendum)
[facility] Assessment Progress Note  Per Juanetta Beets, DO, this pt does not require psychiatric hospitalization at this time.  Pt presents under IVC initiated by law enforcement, which Dr Sharma Covert has rescinded.  Pt is to be discharged from [facility] with recommendation to follow up with [facility] [place].  This has been included in pt's discharge instructions.  Pt would also benefit from seeing Peer Support Specialists; they will be asked to speak to pt.  Pt's nurse, Aram Beecham, has been notified.  Doylene Canning, MA Triage Specialist [phone]   Addendum:  After consulting with Peer Support, it has been determined that pt may benefit from ACT Team services.  Discharge instructions have been modified to include referral information for ACT Team providers in this area.  Doylene Canning, [place] [facility] Coordinator [phone]   Addendum:  Peer Support has arranged an intake appointment for this pt with [facility].  Appointment is scheduled for Thursday, 10/09/2018 at 12:00.  This has been included in pt's discharge instructions, replacing referrals noted above.  Aram Beecham has been notified.  Doylene Canning, [place] [facility] Coordinator [phone]
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
## 2018-10-07 NOTE — Discharge Instructions (Addendum)
For your behavioral health needs, you are advised to follow up with an outpatient provider.  You may qualify for ACT Team services.  You have an intake appointment with the [facility] ACT Team scheduled for Thursday, October 09, 2018 at 12:00 pm:       [facility]      [address]      [place], [place] [address]      [phone]

## 2018-10-07 NOTE — ED Notes (Signed)
Pharmacy reported they are unable to give samples to the patient because he has medicaid and can get them at a low cost from [facility].

## 2018-10-07 NOTE — ED Notes (Signed)
Patient calm and cooperative talking with peer support.

## 2018-10-07 NOTE — ED Notes (Signed)
Bed: [facility]43 Expected date:  Expected time:  Means of arrival:  Comments: 

## 2019-12-30 ENCOUNTER — Encounter (HOSPITAL_COMMUNITY): Payer: Self-pay

## 2019-12-30 ENCOUNTER — Other Ambulatory Visit: Payer: Self-pay

## 2019-12-30 ENCOUNTER — Emergency Department (HOSPITAL_COMMUNITY)
Admission: EM | Admit: 2019-12-30 | Discharge: 2019-12-30 | Disposition: A | Discharge status: Home / Self Care | Payer: Medicaid Other | Attending: Emergency Medicine | Admitting: Emergency Medicine

## 2019-12-30 DIAGNOSIS — Z79899 Other long term (current) drug therapy: Secondary | ICD-10-CM | POA: Insufficient documentation

## 2019-12-30 DIAGNOSIS — I10 Essential (primary) hypertension: Secondary | ICD-10-CM | POA: Insufficient documentation

## 2019-12-30 DIAGNOSIS — F17210 Nicotine dependence, cigarettes, uncomplicated: Secondary | ICD-10-CM | POA: Diagnosis not present

## 2019-12-30 DIAGNOSIS — F1123 Opioid dependence with withdrawal: Secondary | ICD-10-CM | POA: Diagnosis not present

## 2019-12-30 DIAGNOSIS — F1193 Opioid use, unspecified with withdrawal: Secondary | ICD-10-CM

## 2019-12-30 DIAGNOSIS — R112 Nausea with vomiting, unspecified: Secondary | ICD-10-CM | POA: Diagnosis present

## 2019-12-30 LAB — CBC WITH DIFFERENTIAL/PLATELET
Abs Immature Granulocytes: 0.02 10*3/uL (ref 0.00–0.07)
Basophils Absolute: 0 10*3/uL (ref 0.0–0.1)
Basophils Relative: 0 %
Eosinophils Absolute: 0 10*3/uL (ref 0.0–0.5)
Eosinophils Relative: 1 %
HCT: 48.1 % (ref 39.0–52.0)
Hemoglobin: 15.7 g/dL (ref 13.0–17.0)
Immature Granulocytes: 0 %
Lymphocytes Relative: 23 %
Lymphs Abs: 1.4 10*3/uL (ref 0.7–4.0)
MCH: 28.3 pg (ref 26.0–34.0)
MCHC: 32.6 g/dL (ref 30.0–36.0)
MCV: 86.8 fL (ref 80.0–100.0)
Monocytes Absolute: 0.7 10*3/uL (ref 0.1–1.0)
Monocytes Relative: 11 %
Neutro Abs: 4.1 10*3/uL (ref 1.7–7.7)
Neutrophils Relative %: 65 %
Platelets: 266 10*3/uL (ref 150–400)
RBC: 5.54 MIL/uL (ref 4.22–5.81)
RDW: 13.1 % (ref 11.5–15.5)
WBC: 6.3 10*3/uL (ref 4.0–10.5)
nRBC: 0 % (ref 0.0–0.2)

## 2019-12-30 LAB — BASIC METABOLIC PANEL
Anion gap: 11 (ref 5–15)
BUN: 13 mg/dL (ref 6–20)
CO2: 28 mmol/L (ref 22–32)
Calcium: 9 mg/dL (ref 8.9–10.3)
Chloride: 98 mmol/L (ref 98–111)
Creatinine, Ser: 0.9 mg/dL (ref 0.61–1.24)
GFR calc Af Amer: >60 mL/min (ref >60)
GFR calc non Af Amer: >60 mL/min (ref >60)
Glucose, Bld: 112 mg/dL — ABNORMAL HIGH (ref 70–99)
Potassium: 3.4 mmol/L — ABNORMAL LOW (ref 3.5–5.1)
Sodium: 137 mmol/L (ref 135–145)

## 2019-12-30 MED ORDER — KETOROLAC TROMETHAMINE 30 MG/ML IJ SOLN: 30.0000 mg | Freq: Once | INTRAMUSCULAR | Status: DC

## 2019-12-30 MED ORDER — DICYCLOMINE HCL 20 MG PO TABS: 20.0000 mg | ORAL_TABLET | Freq: Two times a day (BID) | ORAL | 0 refills | Status: DC

## 2019-12-30 MED ORDER — DICYCLOMINE HCL 10 MG PO CAPS
10.0000 mg | ORAL_CAPSULE | Freq: Once | ORAL | Status: AC
  Administered 2019-12-30: 15:00:00 10 mg via ORAL
  Filled 2019-12-30: qty 1

## 2019-12-30 MED ORDER — LORAZEPAM 2 MG/ML IJ SOLN
0.5000 mg | Freq: Once | INTRAMUSCULAR | Status: AC
  Administered 2019-12-30: 0.5 mg via INTRAVENOUS
  Filled 2019-12-30: qty 1

## 2019-12-30 MED ORDER — METOCLOPRAMIDE HCL 5 MG/ML IJ SOLN
5.0000 mg | Freq: Once | INTRAMUSCULAR | Status: AC
  Administered 2019-12-30: 13:00:00 5 mg via INTRAVENOUS
  Filled 2019-12-30: qty 2

## 2019-12-30 MED ORDER — SODIUM CHLORIDE 0.9 % IV BOLUS
2000.0000 mL | Freq: Once | INTRAVENOUS | Status: AC
  Administered 2019-12-30: 13:00:00 2000 mL via INTRAVENOUS

## 2019-12-30 MED ORDER — IBUPROFEN 400 MG PO TABS: 400.0000 mg | ORAL_TABLET | Freq: Four times a day (QID) | ORAL | 0 refills | Status: DC | PRN

## 2019-12-30 MED ORDER — SODIUM CHLORIDE 0.9 % IV SOLN: INTRAVENOUS | Status: DC

## 2019-12-30 MED ORDER — ONDANSETRON 8 MG PO TBDP: 8.0000 mg | ORAL_TABLET | Freq: Three times a day (TID) | ORAL | 0 refills | Status: DC | PRN

## 2019-12-30 NOTE — ED Notes (Addendum)
Patient reluctantly left to the lobby. Accompanied by Clinical research associate and Molly Maduro, EMT. Robert carrried large yard bag with belongings in it, patient stated that he did not want to take the bus that was provided for.and wanted a cab to be called. Molly Maduro called [employer] [place] cab for the patient. Patient made aware that he would have to pay for the cab himself.

## 2019-12-30 NOTE — ED Triage Notes (Signed)
Per EMS- Patient was picked up from the [facility]. Patient states he has not had any meth or heroin x 3 days and is now having N/v and abdominal pain.

## 2019-12-30 NOTE — ED Provider Notes (Signed)
[facility]-EMERGENCY DEPT Provider Note   CSN: [id number] Arrival date & time: 12/30/19  1041     History Chief Complaint  Patient presents with  . withdrawal symptoms    Osceola Depaz is a 54 y.o. male.  54 year old male with history of hypertension as well as opiate addiction presents with withdrawal symptoms.  States that he last used heroin 3 days ago and since that time has had nausea vomiting.  No diarrhea.  Diffuse abdominal crampiness.  Patient states symptoms are similar to his prior opiate withdrawal.  Denies any fever or chills.  Emesis has been nonbilious and bloody.  No treatment use prior to arrival.        Past Medical History:  Diagnosis Date  . Bipolar 1 disorder (HCC)   . Depression   . Hypertension   . PTSD (post-traumatic stress disorder)     Patient Active Problem List   Diagnosis Date Noted  . MDD (major depressive disorder), recurrent episode, severe (HCC) 09/10/2018  . Moderate bipolar I disorder, most recent episode depressed (HCC) 07/07/2018  . Homicidal ideation   . Alcohol abuse with alcohol-induced mood disorder (HCC) 03/29/2018  . Cannabis use disorder, mild, abuse 02/12/2018  . Bipolar I disorder (HCC) 02/11/2018  . MDD (major depressive disorder) 12/11/2017  . Intentional overdose of drug in tablet form (HCC) 11/08/2017  . Dyslipidemia 11/08/2017  . Constipation 11/08/2017  . Drug overdose 11/05/2017  . Acute alcoholic intoxication without complication (HCC)   . Suicidal ideation   . Alcohol intoxication (HCC) 10/22/2017  . Alcohol withdrawal (HCC) 10/22/2017  . Polysubstance abuse (HCC)   . Major depressive disorder, recurrent, severe with psychotic features (HCC) 05/15/2017  . Cocaine abuse with cocaine-induced mood disorder (HCC) 04/01/2017  . HTN (hypertension) 06/20/2016  . Tobacco use disorder 06/20/2016  . Trauma 07/27/2015  . Cannabis use disorder, moderate, dependence (HCC)   . PTSD (post-traumatic  stress disorder)   . Substance induced mood disorder (HCC) 07/07/2014  . Alcohol use disorder, moderate, dependence (HCC) 04/10/2012  . Suicidal thoughts 04/04/2012    History reviewed. No pertinent surgical history.     Family History  Problem Relation Age of Onset  . Heart attack Other     Social History   Tobacco Use  . Smoking status: Current Every Day Smoker    Packs/day: 0.25    Years: 30.00    Pack years: 7.50    Types: Cigarettes  . Smokeless tobacco: Never Used  Substance Use Topics  . Alcohol use: Not Currently  . Drug use: Yes    Types: Marijuana, Methamphetamines    Comment: heroin    Home Medications Prior to Admission medications   Medication Sig Start Date End Date Taking? Authorizing Provider  amLODipine (NORVASC) 5 MG tablet Take 1 tablet (5 mg total) by mouth daily. 10/08/18   Starkes-Perry, Juel Burrow, FNP  ARIPiprazole (ABILIFY) 5 MG tablet Take 1 tablet (5 mg total) by mouth daily. 10/07/18   Starkes-Perry, Juel Burrow, FNP  buPROPion (WELLBUTRIN XL) 150 MG 24 hr tablet Take 1 tablet (150 mg total) by mouth daily. 10/07/18   Starkes-Perry, Juel Burrow, FNP  hydrOXYzine (ATARAX/VISTARIL) 25 MG tablet Take 1 tablet (25 mg total) by mouth every 6 (six) hours as needed (anxiety). 10/07/18   Starkes-Perry, Juel Burrow, FNP  lisinopril (PRINIVIL,ZESTRIL) 10 MG tablet Take 1 tablet (10 mg total) by mouth daily. 10/08/18   Starkes-Perry, Juel Burrow, FNP  mirtazapine (REMERON) 7.5 MG tablet Take 1 tablet (7.5  mg total) by mouth at bedtime. 10/07/18   Starkes-Perry, Juel Burrow, FNP  pantoprazole (PROTONIX) 40 MG tablet Take 1 tablet (40 mg total) by mouth daily. For acid reflux 07/18/18   Armandina Stammer I, NP  prazosin (MINIPRESS) 2 MG capsule Take 1 capsule (2 mg total) by mouth at bedtime. 10/07/18   Starkes-Perry, Juel Burrow, FNP  traZODone (DESYREL) 50 MG tablet Take 1 tablet (50 mg total) by mouth at bedtime as needed for sleep. 10/07/18   Maryagnes Amos, FNP    Allergies      Patient has no known allergies.  Review of Systems   Review of Systems  All other systems reviewed and are negative.   Physical Exam Updated Vital Signs BP 137/82 (BP Location: Left Arm)   Pulse 65   Temp 98.2 F (36.8 C) (Oral)   Resp 20   Ht 1.778 m (5\' 10" )   Wt 79.4 kg   SpO2 94%   BMI 25.11 kg/m   Physical Exam Vitals and nursing note reviewed.  Constitutional:      General: He is not in acute distress.    Appearance: Normal appearance. He is well-developed. He is not toxic-appearing.  HENT:     Head: Normocephalic and atraumatic.  Eyes:     General: Lids are normal.     Conjunctiva/sclera: Conjunctivae normal.     Pupils: Pupils are equal, round, and reactive to light.  Neck:     Thyroid: No thyroid mass.     Trachea: No tracheal deviation.  Cardiovascular:     Rate and Rhythm: Normal rate and regular rhythm.     Heart sounds: Normal heart sounds. No murmur. No gallop.   Pulmonary:     Effort: Pulmonary effort is normal. No respiratory distress.     Breath sounds: Normal breath sounds. No stridor. No decreased breath sounds, wheezing, rhonchi or rales.  Abdominal:     General: Bowel sounds are normal. There is no distension.     Palpations: Abdomen is soft.     Tenderness: There is no abdominal tenderness. There is no rebound.  Musculoskeletal:        General: No tenderness. Normal range of motion.     Cervical back: Normal range of motion and neck supple.  Skin:    General: Skin is warm and dry.     Findings: No abrasion or rash.  Neurological:     Mental Status: He is alert and oriented to person, place, and time.     GCS: GCS eye subscore is 4. GCS verbal subscore is 5. GCS motor subscore is 6.     Cranial Nerves: No cranial nerve deficit.     Sensory: No sensory deficit.  Psychiatric:        Speech: Speech normal.        Behavior: Behavior normal.     ED Results / Procedures / Treatments   Labs (all labs ordered are listed, but only abnormal  results are displayed) Labs Reviewed  CBC WITH DIFFERENTIAL/PLATELET  BASIC METABOLIC PANEL    EKG None  Radiology No results found.  Procedures Procedures (including critical care time)  Medications Ordered in ED Medications  sodium chloride 0.9 % bolus 2,000 mL (has no administration in time range)  0.9 %  sodium chloride infusion (has no administration in time range)  metoCLOPramide (REGLAN) injection 5 mg (has no administration in time range)  LORazepam (ATIVAN) injection 0.5 mg (has no administration in time range)  ED Course  I have reviewed the triage vital signs and the nursing notes.  Pertinent labs & imaging results that were available during my care of the patient were reviewed by me and considered in my medical decision making (see chart for details).    MDM Rules/Calculators/A&P                      Patient given IV fluids along with Reglan Ativan and Bentyl.  No active psychiatric needs at this time.  Consult made to peers support. Final Clinical Impression(s) / ED Diagnoses Final diagnoses:  None    Rx / DC Orders ED Discharge Orders    None       Lacretia Leigh, MD 12/30/19 1418

## 2019-12-30 NOTE — Progress Notes (Signed)
CSW met with pt and provided a detailed map showing the bus route from [facility] to the pt's chosen destination at:  [place], [place] [address]  When CSW stated a bus pass would be provided to the pt at D/C the pt protested the bus doesn't go to the pt's chosen destination.    CSW affirmed this is true but provided the pt with another map that showed in detail that the pt's chosen destination is approx a 15 minute walk from the bus stop closest to pt's chosen destination and pt protested that this is not true and stated, "I've walked that way before and it's longer that that".  CSW informed pt that he cannot be proceed with a taxi voucher due to his being able to ambulate as taxi voucher are reserved for patients who cannot safely take the bus.  Pt protested he had a large bag to carry and CSW provided a recommendation that pt attempt to carry the bag or decide to take what he can and attempt to return for the rest and pt presented as irritable and did not answer..  CSW updated RN.  Please reconsult if future social work needs arise.  CSW signing off, as social work intervention is no longer needed.  Alphonse Guild. Myrissa Chipley, LCSW, LCAS, CSI Transitions of Care Clinical Social Worker Care Coordination Department Ph: [phone]

## 2019-12-30 NOTE — Progress Notes (Signed)
TOC CM spoke to pt and states his bank card is at his friend's home. States he needs transportation to his friend's house at [employer]. Pt has Medicaid for his medications. States he has money to pay for his copay, he just needs his bank card. CSW referral for transportation issues. Isidoro Donning RN CCM, [facility] ED TOC CM [phone]

## 2019-12-30 NOTE — ED Notes (Signed)
Patient refused to sign for discharge.  

## 2019-12-30 NOTE — ED Notes (Signed)
Patient refused to get discharge vitals.

## 2019-12-30 NOTE — Patient Outreach (Signed)
CPSS spoke with Pt, but was not able to complete series of questions for assessment. Pt stated that he does want help, CPSS faxed Pt information to [facility] an are waiting for reply back.

## 2019-12-30 NOTE — ED Notes (Signed)
Waiting for SW to see patient.

## 2020-04-13 ENCOUNTER — Other Ambulatory Visit: Payer: Self-pay

## 2020-04-13 ENCOUNTER — Encounter (HOSPITAL_COMMUNITY): Payer: Self-pay

## 2020-04-13 ENCOUNTER — Emergency Department (HOSPITAL_COMMUNITY): Payer: Medicaid Other

## 2020-04-13 ENCOUNTER — Emergency Department (HOSPITAL_COMMUNITY)
Admission: EM | Admit: 2020-04-13 | Discharge: 2020-04-13 | Disposition: A | Discharge status: Home / Self Care | Payer: Medicaid Other | Attending: Emergency Medicine | Admitting: Emergency Medicine

## 2020-04-13 DIAGNOSIS — F10929 Alcohol use, unspecified with intoxication, unspecified: Secondary | ICD-10-CM | POA: Diagnosis present

## 2020-04-13 DIAGNOSIS — I10 Essential (primary) hypertension: Secondary | ICD-10-CM | POA: Diagnosis not present

## 2020-04-13 DIAGNOSIS — F1010 Alcohol abuse, uncomplicated: Secondary | ICD-10-CM

## 2020-04-13 DIAGNOSIS — Z79899 Other long term (current) drug therapy: Secondary | ICD-10-CM | POA: Insufficient documentation

## 2020-04-13 DIAGNOSIS — F17210 Nicotine dependence, cigarettes, uncomplicated: Secondary | ICD-10-CM | POA: Insufficient documentation

## 2020-04-13 MED ORDER — SODIUM CHLORIDE 0.9 % IV BOLUS: 1000.0000 mL | Freq: Once | INTRAVENOUS | Status: DC

## 2020-04-13 NOTE — ED Provider Notes (Signed)
[facility]-EMERGENCY DEPT Provider Note   CSN: [id number] Arrival date & time: 04/13/20  2035     History Chief Complaint  Patient presents with  . Alcohol Problem    Chase Moss is a 54 y.o. male.  Patient states that he drank alcohol today.  States he needs time to rest.  Denies any other drug use.  The history is provided by the patient.  Alcohol Intoxication This is a new problem. The current episode started less than 1 hour ago. The problem occurs constantly. The problem has not changed since onset.Pertinent negatives include no chest pain, no abdominal pain, no headaches and no shortness of breath. Nothing aggravates the symptoms. He has tried nothing for the symptoms. The treatment provided no relief.       Past Medical History:  Diagnosis Date  . Bipolar 1 disorder (HCC)   . Depression   . Hypertension   . PTSD (post-traumatic stress disorder)     Patient Active Problem List   Diagnosis Date Noted  . MDD (major depressive disorder), recurrent episode, severe (HCC) 09/10/2018  . Moderate bipolar I disorder, most recent episode depressed (HCC) 07/07/2018  . Homicidal ideation   . Alcohol abuse with alcohol-induced mood disorder (HCC) 03/29/2018  . Cannabis use disorder, mild, abuse 02/12/2018  . Bipolar I disorder (HCC) 02/11/2018  . MDD (major depressive disorder) 12/11/2017  . Intentional overdose of drug in tablet form (HCC) 11/08/2017  . Dyslipidemia 11/08/2017  . Constipation 11/08/2017  . Drug overdose 11/05/2017  . Acute alcoholic intoxication without complication (HCC)   . Suicidal ideation   . Alcohol intoxication (HCC) 10/22/2017  . Alcohol withdrawal (HCC) 10/22/2017  . Polysubstance abuse (HCC)   . Major depressive disorder, recurrent, severe with psychotic features (HCC) 05/15/2017  . Cocaine abuse with cocaine-induced mood disorder (HCC) 04/01/2017  . HTN (hypertension) 06/20/2016  . Tobacco use disorder 06/20/2016  .  Trauma 07/27/2015  . Cannabis use disorder, moderate, dependence (HCC)   . PTSD (post-traumatic stress disorder)   . Substance induced mood disorder (HCC) 07/07/2014  . Alcohol use disorder, moderate, dependence (HCC) 04/10/2012  . Suicidal thoughts 04/04/2012    History reviewed. No pertinent surgical history.     Family History  Problem Relation Age of Onset  . Heart attack Other     Social History   Tobacco Use  . Smoking status: Current Every Day Smoker    Packs/day: 0.25    Years: 30.00    Pack years: 7.50    Types: Cigarettes  . Smokeless tobacco: Never Used  Substance Use Topics  . Alcohol use: Not Currently  . Drug use: Yes    Types: Marijuana, Methamphetamines    Comment: heroin    Home Medications Prior to Admission medications   Medication Sig Start Date End Date Taking? Authorizing Provider  amLODipine (NORVASC) 5 MG tablet Take 1 tablet (5 mg total) by mouth daily. 10/08/18   Starkes-Perry, Juel Burrow, FNP  ARIPiprazole (ABILIFY) 5 MG tablet Take 1 tablet (5 mg total) by mouth daily. 10/07/18   Starkes-Perry, Juel Burrow, FNP  buPROPion (WELLBUTRIN XL) 150 MG 24 hr tablet Take 1 tablet (150 mg total) by mouth daily. 10/07/18   Starkes-Perry, Juel Burrow, FNP  dicyclomine (BENTYL) 20 MG tablet Take 1 tablet (20 mg total) by mouth 2 (two) times daily. 12/30/19   Lorre Nick, MD  hydrOXYzine (ATARAX/VISTARIL) 25 MG tablet Take 1 tablet (25 mg total) by mouth every 6 (six) hours as needed (anxiety).  10/07/18   Starkes-Perry, Juel Burrow, FNP  ibuprofen (ADVIL) 400 MG tablet Take 1 tablet (400 mg total) by mouth every 6 (six) hours as needed. 12/30/19   Lorre Nick, MD  lisinopril (PRINIVIL,ZESTRIL) 10 MG tablet Take 1 tablet (10 mg total) by mouth daily. 10/08/18   Starkes-Perry, Juel Burrow, FNP  mirtazapine (REMERON) 7.5 MG tablet Take 1 tablet (7.5 mg total) by mouth at bedtime. 10/07/18   Starkes-Perry, Juel Burrow, FNP  ondansetron (ZOFRAN ODT) 8 MG disintegrating tablet Take 1  tablet (8 mg total) by mouth every 8 (eight) hours as needed for nausea or vomiting. 12/30/19   Lorre Nick, MD  pantoprazole (PROTONIX) 40 MG tablet Take 1 tablet (40 mg total) by mouth daily. For acid reflux 07/18/18   Armandina Stammer I, NP  prazosin (MINIPRESS) 2 MG capsule Take 1 capsule (2 mg total) by mouth at bedtime. 10/07/18   Starkes-Perry, Juel Burrow, FNP  traZODone (DESYREL) 50 MG tablet Take 1 tablet (50 mg total) by mouth at bedtime as needed for sleep. 10/07/18   Maryagnes Amos, FNP    Allergies    Patient has no known allergies.  Review of Systems   Review of Systems  Constitutional: Negative for chills and fever.  HENT: Negative for ear pain and sore throat.   Eyes: Negative for pain and visual disturbance.  Respiratory: Negative for cough and shortness of breath.   Cardiovascular: Negative for chest pain and palpitations.  Gastrointestinal: Negative for abdominal pain and vomiting.  Genitourinary: Negative for dysuria and hematuria.  Musculoskeletal: Negative for arthralgias and back pain.  Skin: Negative for color change and rash.  Neurological: Negative for seizures, syncope and headaches.  All other systems reviewed and are negative.   Physical Exam Updated Vital Signs BP 131/82 (BP Location: Left Arm)   Pulse 84   Temp 98 F (36.7 C) (Oral)   Resp 13   SpO2 97%   Physical Exam Vitals and nursing note reviewed.  Constitutional:      General: He is not in acute distress.    Appearance: He is well-developed. He is not ill-appearing.  HENT:     Head: Normocephalic and atraumatic.     Nose: Nose normal.     Mouth/Throat:     Mouth: Mucous membranes are moist.  Eyes:     Extraocular Movements: Extraocular movements intact.     Conjunctiva/sclera: Conjunctivae normal.     Pupils: Pupils are equal, round, and reactive to light.  Cardiovascular:     Rate and Rhythm: Normal rate and regular rhythm.     Pulses: Normal pulses.     Heart sounds: Normal  heart sounds. No murmur.  Pulmonary:     Effort: Pulmonary effort is normal. No respiratory distress.     Breath sounds: Normal breath sounds.  Abdominal:     Palpations: Abdomen is soft.     Tenderness: There is no abdominal tenderness.  Musculoskeletal:     Cervical back: Normal range of motion and neck supple.  Skin:    General: Skin is warm and dry.  Neurological:     General: No focal deficit present.     Mental Status: He is alert.     Comments: Patient is somnolent but easily arousable, seems to move all extremities     ED Results / Procedures / Treatments   Labs (all labs ordered are listed, but only abnormal results are displayed) Labs Reviewed - No data to display  EKG None  Radiology  No results found.  Procedures Procedures (including critical care time)  Medications Ordered in ED Medications - No data to display  ED Course  I have reviewed the triage vital signs and the nursing notes.  Pertinent labs & imaging results that were available during my care of the patient were reviewed by me and considered in my medical decision making (see chart for details).    MDM Rules/Calculators/A&P                      Chase Moss is a 54 year old male with history of bipolar disorder, depression who presents to the ED with alcohol intoxication.  Patient mostly here due to homelessness.  States he just needs somewhere to sleep for a little bit.  States that he has been using alcohol but denies any other drug use.  Patient overall appears neurologically intact.  He is somnolent but easily arousable.  Patient refused IV fluids, IV labs, head CT.  Overall I think that is reasonable as patient is overall stable.  Will reevaluate.  Reevaluation after patient had some time to rest and he is clinically improving.  He is able to walk without any issues.  He was able to eat and drink without any issues.  No concern for head bleed or other acute emergent process.  Discharged in  good condition.  This chart was dictated using voice recognition software.  Despite best efforts to proofread,  errors can occur which can change the documentation meaning.   Final Clinical Impression(s) / ED Diagnoses Final diagnoses:  Alcohol abuse    Rx / DC Orders ED Discharge Orders    None       Lennice Sites, DO 04/13/20 2328

## 2020-04-13 NOTE — ED Notes (Signed)
Pt refused IV, blood work, and fluids. MD aware. When RN was asking patient reason to be seen, pt kept nodding off to sleep and would not finish sentences.

## 2020-04-13 NOTE — ED Notes (Signed)
Pt given water and crackers per MD

## 2020-04-13 NOTE — ED Notes (Signed)
PT request to stay in wheelchair

## 2020-04-13 NOTE — ED Notes (Signed)
Pt refused ordered head CT.

## 2020-04-13 NOTE — ED Triage Notes (Signed)
Pt reports alcohol use tonight and states that he does not usually drink. Denies drug use. Pt is sleeping in triage. Easily arousable.

## 2020-04-13 NOTE — ED Notes (Signed)
Pt is currently resting with eyes closed, with normal with respirations.

## 2020-04-14 ENCOUNTER — Ambulatory Visit (HOSPITAL_COMMUNITY): Admission: RE | Admit: 2020-04-14 | Payer: Medicaid Other | Source: Ambulatory Visit

## 2020-06-14 ENCOUNTER — Ambulatory Visit (HOSPITAL_COMMUNITY)
Admission: EM | Admit: 2020-06-14 | Discharge: 2020-06-15 | Disposition: A | Discharge status: Other Acute Inpatient Hospital | Payer: Medicaid Other | Attending: Psychiatry | Admitting: Psychiatry

## 2020-06-14 DIAGNOSIS — F332 Major depressive disorder, recurrent severe without psychotic features: Secondary | ICD-10-CM

## 2020-06-14 DIAGNOSIS — Z20822 Contact with and (suspected) exposure to covid-19: Secondary | ICD-10-CM | POA: Insufficient documentation

## 2020-06-14 DIAGNOSIS — F1124 Opioid dependence with opioid-induced mood disorder: Secondary | ICD-10-CM | POA: Diagnosis present

## 2020-06-14 DIAGNOSIS — R45851 Suicidal ideations: Secondary | ICD-10-CM | POA: Diagnosis not present

## 2020-06-14 DIAGNOSIS — F1210 Cannabis abuse, uncomplicated: Secondary | ICD-10-CM

## 2020-06-14 DIAGNOSIS — F319 Bipolar disorder, unspecified: Secondary | ICD-10-CM | POA: Insufficient documentation

## 2020-06-14 DIAGNOSIS — F4310 Post-traumatic stress disorder, unspecified: Secondary | ICD-10-CM | POA: Insufficient documentation

## 2020-06-14 DIAGNOSIS — Z590 Homelessness: Secondary | ICD-10-CM | POA: Insufficient documentation

## 2020-06-14 DIAGNOSIS — F17210 Nicotine dependence, cigarettes, uncomplicated: Secondary | ICD-10-CM | POA: Diagnosis not present

## 2020-06-14 DIAGNOSIS — F1110 Opioid abuse, uncomplicated: Secondary | ICD-10-CM | POA: Insufficient documentation

## 2020-06-14 LAB — LIPID PANEL
Cholesterol: 145 mg/dL (ref 0–200)
HDL: 50 mg/dL (ref >40)
LDL Cholesterol: 80 mg/dL (ref 0–99)
Total CHOL/HDL Ratio: 2.9 RATIO
Triglycerides: 75 mg/dL (ref <150)
VLDL: 15 mg/dL (ref 0–40)

## 2020-06-14 LAB — COMPREHENSIVE METABOLIC PANEL
ALT: 17 U/L (ref 0–44)
AST: 19 U/L (ref 15–41)
Albumin: 3.5 g/dL (ref 3.5–5.0)
Alkaline Phosphatase: 85 U/L (ref 38–126)
Anion gap: 11 (ref 5–15)
BUN: 15 mg/dL (ref 6–20)
CO2: 27 mmol/L (ref 22–32)
Calcium: 8.6 mg/dL — ABNORMAL LOW (ref 8.9–10.3)
Chloride: 99 mmol/L (ref 98–111)
Creatinine, Ser: 1.03 mg/dL (ref 0.61–1.24)
GFR calc Af Amer: >60 mL/min (ref >60)
GFR calc non Af Amer: >60 mL/min (ref >60)
Glucose, Bld: 93 mg/dL (ref 70–99)
Potassium: 3.1 mmol/L — ABNORMAL LOW (ref 3.5–5.1)
Sodium: 137 mmol/L (ref 135–145)
Total Bilirubin: 0.9 mg/dL (ref 0.3–1.2)
Total Protein: 5.8 g/dL — ABNORMAL LOW (ref 6.5–8.1)

## 2020-06-14 LAB — POCT URINE DRUG SCREEN - MANUAL ENTRY (I-SCREEN)
POC Amphetamine UR: NOT DETECTED
POC Buprenorphine (BUP): NOT DETECTED
POC Cocaine UR: NOT DETECTED
POC Marijuana UR: POSITIVE — AB
POC Methadone UR: NOT DETECTED
POC Methamphetamine UR: NOT DETECTED
POC Morphine: NOT DETECTED
POC Oxazepam (BZO): NOT DETECTED
POC Oxycodone UR: NOT DETECTED
POC Secobarbital (BAR): NOT DETECTED

## 2020-06-14 LAB — ETHANOL: Alcohol, Ethyl (B): <10 mg/dL (ref <10)

## 2020-06-14 LAB — TSH: TSH: 0.261 u[IU]/mL — ABNORMAL LOW (ref 0.350–4.500)

## 2020-06-14 MED ORDER — MIRTAZAPINE 7.5 MG PO TABS
7.5000 mg | ORAL_TABLET | Freq: Every day | ORAL | Status: DC
  Administered 2020-06-14: 7.5 mg via ORAL
  Filled 2020-06-14: qty 1

## 2020-06-14 MED ORDER — HYDROXYZINE HCL 25 MG PO TABS: 25.0000 mg | ORAL_TABLET | Freq: Three times a day (TID) | ORAL | Status: DC | PRN

## 2020-06-14 MED ORDER — ONDANSETRON 4 MG PO TBDP: 4.0000 mg | ORAL_TABLET | Freq: Four times a day (QID) | ORAL | Status: DC | PRN

## 2020-06-14 MED ORDER — MAGNESIUM HYDROXIDE 400 MG/5ML PO SUSP: 30.0000 mL | Freq: Every day | ORAL | Status: DC | PRN

## 2020-06-14 MED ORDER — CLONIDINE HCL 0.1 MG PO TABS: 0.1000 mg | ORAL_TABLET | ORAL | Status: DC

## 2020-06-14 MED ORDER — NAPROXEN 500 MG PO TABS: 500.0000 mg | ORAL_TABLET | Freq: Two times a day (BID) | ORAL | Status: DC | PRN

## 2020-06-14 MED ORDER — TRAZODONE HCL 50 MG PO TABS: 50.0000 mg | ORAL_TABLET | Freq: Every evening | ORAL | Status: DC | PRN

## 2020-06-14 MED ORDER — CLONIDINE HCL 0.1 MG PO TABS: 0.1000 mg | ORAL_TABLET | Freq: Every day | ORAL | Status: DC

## 2020-06-14 MED ORDER — BUPROPION HCL ER (XL) 150 MG PO TB24: 150.0000 mg | ORAL_TABLET | Freq: Every day | ORAL | Status: DC

## 2020-06-14 MED ORDER — LOPERAMIDE HCL 2 MG PO CAPS: 2.0000 mg | ORAL_CAPSULE | ORAL | Status: DC | PRN

## 2020-06-14 MED ORDER — ALUM & MAG HYDROXIDE-SIMETH 200-200-20 MG/5ML PO SUSP: 30.0000 mL | ORAL | Status: DC | PRN

## 2020-06-14 MED ORDER — LISINOPRIL 10 MG PO TABS
10.0000 mg | ORAL_TABLET | Freq: Every day | ORAL | Status: DC
  Administered 2020-06-14: 10 mg via ORAL
  Filled 2020-06-14: qty 1

## 2020-06-14 MED ORDER — DICYCLOMINE HCL 20 MG PO TABS: 20.0000 mg | ORAL_TABLET | Freq: Four times a day (QID) | ORAL | Status: DC | PRN

## 2020-06-14 MED ORDER — CLONIDINE HCL 0.1 MG PO TABS
0.1000 mg | ORAL_TABLET | Freq: Four times a day (QID) | ORAL | Status: DC
  Administered 2020-06-14 (×2): 0.1 mg via ORAL
  Filled 2020-06-14 (×2): qty 1

## 2020-06-14 MED ORDER — ARIPIPRAZOLE 5 MG PO TABS
5.0000 mg | ORAL_TABLET | Freq: Every day | ORAL | Status: DC
  Administered 2020-06-14: 5 mg via ORAL
  Filled 2020-06-14: qty 1

## 2020-06-14 MED ORDER — PANTOPRAZOLE SODIUM 20 MG PO TBEC
40.0000 mg | DELAYED_RELEASE_TABLET | Freq: Every day | ORAL | Status: DC
  Administered 2020-06-14: 40 mg via ORAL
  Filled 2020-06-14: qty 2

## 2020-06-14 MED ORDER — HYDROXYZINE HCL 25 MG PO TABS: 25.0000 mg | ORAL_TABLET | Freq: Four times a day (QID) | ORAL | Status: DC | PRN

## 2020-06-14 MED ORDER — ACETAMINOPHEN 325 MG PO TABS: 650.0000 mg | ORAL_TABLET | Freq: Four times a day (QID) | ORAL | Status: DC | PRN

## 2020-06-14 MED ORDER — METHOCARBAMOL 500 MG PO TABS: 500.0000 mg | ORAL_TABLET | Freq: Three times a day (TID) | ORAL | Status: DC | PRN
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
## 2020-06-14 NOTE — Discharge Instructions (Signed)
None
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
## 2020-06-14 NOTE — ED Notes (Signed)
Patient given snacks and fluids upon being brought to the unit. Patient presents irritable and stated that he was going through detox. Patient given a clean set of clothes upon arrival to the urgent care center. Patient took and tolerated medications administered while on the observation unit. Patient will be transported to [facility] approx. 2000. Will inform on-coming shift to notify safe travel for transport.

## 2020-06-14 NOTE — Progress Notes (Signed)
Pt accepted to [facility], bed 303-1  Reola Calkins, NP is the accepting provider.    Dr. Jola Babinski is the attending provider.    Call report to [phone]    Pt is scheduled to arrive at [facility] at 8pm.    Wells Guiles, LCSW, LCAS Disposition CSW [facility]/TTS [phone]

## 2020-06-14 NOTE — ED Notes (Signed)
Patient resting with eyes closed. No distress. Respirations even and non labored. Monitoring continues.

## 2020-06-14 NOTE — ED Notes (Signed)
Patient uncooperative with  EKG
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
## 2020-06-15 ENCOUNTER — Other Ambulatory Visit: Payer: Self-pay

## 2020-06-15 ENCOUNTER — Encounter (HOSPITAL_COMMUNITY): Payer: Self-pay | Admitting: Psychiatry

## 2020-06-15 ENCOUNTER — Inpatient Hospital Stay (HOSPITAL_COMMUNITY)
Admission: AD | Admit: 2020-06-15 | Discharge: 2020-06-17 | DRG: 885 | Disposition: A | Discharge status: Home / Self Care | Payer: Medicaid Other | Source: Intra-hospital | Attending: Psychiatry | Admitting: Psychiatry

## 2020-06-15 DIAGNOSIS — F332 Major depressive disorder, recurrent severe without psychotic features: Secondary | ICD-10-CM | POA: Diagnosis present

## 2020-06-15 DIAGNOSIS — R45851 Suicidal ideations: Secondary | ICD-10-CM | POA: Diagnosis present

## 2020-06-15 DIAGNOSIS — F1014 Alcohol abuse with alcohol-induced mood disorder: Secondary | ICD-10-CM | POA: Diagnosis present

## 2020-06-15 DIAGNOSIS — Y900 Blood alcohol level of less than 20 mg/100 ml: Secondary | ICD-10-CM | POA: Diagnosis present

## 2020-06-15 DIAGNOSIS — Z9114 Patient's other noncompliance with medication regimen: Secondary | ICD-10-CM

## 2020-06-15 DIAGNOSIS — K219 Gastro-esophageal reflux disease without esophagitis: Secondary | ICD-10-CM | POA: Diagnosis present

## 2020-06-15 DIAGNOSIS — F1123 Opioid dependence with withdrawal: Secondary | ICD-10-CM | POA: Diagnosis present

## 2020-06-15 DIAGNOSIS — F4310 Post-traumatic stress disorder, unspecified: Secondary | ICD-10-CM | POA: Diagnosis present

## 2020-06-15 DIAGNOSIS — Z590 Homelessness: Secondary | ICD-10-CM | POA: Diagnosis not present

## 2020-06-15 DIAGNOSIS — F17210 Nicotine dependence, cigarettes, uncomplicated: Secondary | ICD-10-CM | POA: Diagnosis present

## 2020-06-15 DIAGNOSIS — I10 Essential (primary) hypertension: Secondary | ICD-10-CM | POA: Diagnosis present

## 2020-06-15 DIAGNOSIS — F319 Bipolar disorder, unspecified: Secondary | ICD-10-CM | POA: Diagnosis present

## 2020-06-15 DIAGNOSIS — Z915 Personal history of self-harm: Secondary | ICD-10-CM

## 2020-06-15 DIAGNOSIS — G4700 Insomnia, unspecified: Secondary | ICD-10-CM | POA: Diagnosis present

## 2020-06-15 LAB — POC SARS CORONAVIRUS 2 AG: SARS Coronavirus 2 Ag: NEGATIVE

## 2020-06-15 MED ORDER — LISINOPRIL 10 MG PO TABS
10.0000 mg | ORAL_TABLET | Freq: Every day | ORAL | Status: DC
  Administered 2020-06-15 – 2020-06-16 (×2): 10 mg via ORAL
  Filled 2020-06-15: qty 1
  Filled 2020-06-15: qty 7
  Filled 2020-06-15 (×3): qty 1

## 2020-06-15 MED ORDER — HYDROXYZINE HCL 25 MG PO TABS
25.0000 mg | ORAL_TABLET | Freq: Four times a day (QID) | ORAL | Status: DC | PRN
  Administered 2020-06-16: 25 mg via ORAL
  Filled 2020-06-15 (×2): qty 1

## 2020-06-15 MED ORDER — MIRTAZAPINE 7.5 MG PO TABS
7.5000 mg | ORAL_TABLET | Freq: Every day | ORAL | Status: DC
  Administered 2020-06-15 – 2020-06-16 (×2): 7.5 mg via ORAL
  Filled 2020-06-15 (×3): qty 1
  Filled 2020-06-15: qty 7

## 2020-06-15 MED ORDER — ACETAMINOPHEN 325 MG PO TABS
650.0000 mg | ORAL_TABLET | Freq: Four times a day (QID) | ORAL | Status: DC | PRN
  Administered 2020-06-15: 650 mg via ORAL
  Filled 2020-06-15: qty 2

## 2020-06-15 MED ORDER — ALUM & MAG HYDROXIDE-SIMETH 200-200-20 MG/5ML PO SUSP
30.0000 mL | ORAL | Status: DC | PRN
  Administered 2020-06-16: 30 mL via ORAL

## 2020-06-15 MED ORDER — ARIPIPRAZOLE 5 MG PO TABS
5.0000 mg | ORAL_TABLET | Freq: Every day | ORAL | Status: DC
  Administered 2020-06-15 – 2020-06-16 (×2): 5 mg via ORAL
  Filled 2020-06-15 (×2): qty 1
  Filled 2020-06-15: qty 7
  Filled 2020-06-15 (×2): qty 1

## 2020-06-15 MED ORDER — PANTOPRAZOLE SODIUM 40 MG PO TBEC
40.0000 mg | DELAYED_RELEASE_TABLET | Freq: Every day | ORAL | Status: DC
  Administered 2020-06-15 – 2020-06-16 (×2): 40 mg via ORAL
  Filled 2020-06-15: qty 1
  Filled 2020-06-15: qty 7
  Filled 2020-06-15 (×3): qty 1

## 2020-06-15 MED ORDER — METHOCARBAMOL 500 MG PO TABS
500.0000 mg | ORAL_TABLET | Freq: Three times a day (TID) | ORAL | Status: DC | PRN
  Administered 2020-06-15 – 2020-06-16 (×3): 500 mg via ORAL
  Filled 2020-06-15 (×4): qty 1

## 2020-06-15 MED ORDER — CLONIDINE HCL 0.1 MG PO TABS: 0.1000 mg | ORAL_TABLET | Freq: Every day | ORAL | Status: DC

## 2020-06-15 MED ORDER — BUPROPION HCL ER (XL) 150 MG PO TB24
150.0000 mg | ORAL_TABLET | Freq: Every day | ORAL | Status: DC
  Administered 2020-06-15 – 2020-06-16 (×2): 150 mg via ORAL
  Filled 2020-06-15 (×5): qty 1
  Filled 2020-06-15: qty 7

## 2020-06-15 MED ORDER — PRAZOSIN HCL 2 MG PO CAPS
2.0000 mg | ORAL_CAPSULE | Freq: Every day | ORAL | Status: DC
  Administered 2020-06-15 – 2020-06-16 (×3): 2 mg via ORAL
  Filled 2020-06-15 (×2): qty 1
  Filled 2020-06-15: qty 7
  Filled 2020-06-15 (×2): qty 1
  Filled 2020-06-15: qty 2

## 2020-06-15 MED ORDER — FOLIC ACID 1 MG PO TABS
1.0000 mg | ORAL_TABLET | Freq: Every day | ORAL | Status: DC
  Administered 2020-06-15 – 2020-06-16 (×2): 1 mg via ORAL
  Filled 2020-06-15 (×5): qty 1

## 2020-06-15 MED ORDER — DICYCLOMINE HCL 20 MG PO TABS
20.0000 mg | ORAL_TABLET | Freq: Four times a day (QID) | ORAL | Status: DC | PRN
  Filled 2020-06-15: qty 1

## 2020-06-15 MED ORDER — THIAMINE HCL 100 MG PO TABS
100.0000 mg | ORAL_TABLET | Freq: Every day | ORAL | Status: DC
  Administered 2020-06-15 – 2020-06-16 (×2): 100 mg via ORAL
  Filled 2020-06-15 (×6): qty 1

## 2020-06-15 MED ORDER — CLONIDINE HCL 0.1 MG PO TABS
0.1000 mg | ORAL_TABLET | ORAL | Status: DC
  Filled 2020-06-15 (×4): qty 1

## 2020-06-15 MED ORDER — CLONIDINE HCL 0.1 MG PO TABS
0.1000 mg | ORAL_TABLET | Freq: Four times a day (QID) | ORAL | Status: DC
  Administered 2020-06-15 – 2020-06-16 (×8): 0.1 mg via ORAL
  Filled 2020-06-15 (×10): qty 1

## 2020-06-15 MED ORDER — LOPERAMIDE HCL 2 MG PO CAPS
2.0000 mg | ORAL_CAPSULE | ORAL | Status: DC | PRN
  Administered 2020-06-16: 4 mg via ORAL
  Filled 2020-06-15: qty 2

## 2020-06-15 MED ORDER — TRAZODONE HCL 50 MG PO TABS
50.0000 mg | ORAL_TABLET | Freq: Every evening | ORAL | Status: DC | PRN
  Filled 2020-06-15: qty 1
  Filled 2020-06-15: qty 7

## 2020-06-15 MED ORDER — CHLORDIAZEPOXIDE HCL 25 MG PO CAPS: 25.0000 mg | ORAL_CAPSULE | Freq: Four times a day (QID) | ORAL | Status: DC | PRN

## 2020-06-15 MED ORDER — MAGNESIUM HYDROXIDE 400 MG/5ML PO SUSP: 30.0000 mL | Freq: Every day | ORAL | Status: DC | PRN

## 2020-06-15 MED ORDER — NAPROXEN 500 MG PO TABS
500.0000 mg | ORAL_TABLET | Freq: Two times a day (BID) | ORAL | Status: DC | PRN
  Administered 2020-06-15 – 2020-06-16 (×3): 500 mg via ORAL
  Filled 2020-06-15 (×3): qty 1

## 2020-06-15 MED ORDER — ONDANSETRON 4 MG PO TBDP: 4.0000 mg | ORAL_TABLET | Freq: Four times a day (QID) | ORAL | Status: DC | PRN

## 2020-06-15 NOTE — Tx Team (Signed)
Initial Treatment Plan 06/15/2020 4:11 AM [name] VVK:[id number]    PATIENT STRESSORS: Marital or family conflict Medication change or noncompliance Substance abuse   PATIENT STRENGTHS: General fund of knowledge Motivation for treatment/growth   PATIENT IDENTIFIED PROBLEMS: Risk for suicide  SA-"heroin"  "help with attitude"                 DISCHARGE CRITERIA:  Adequate post-discharge living arrangements Improved stabilization in mood, thinking, and/or behavior Withdrawal symptoms are absent or subacute and managed without 24-hour nursing intervention  PRELIMINARY DISCHARGE PLAN: Attend PHP/IOP Attend 12-step recovery group Outpatient therapy  PATIENT/FAMILY INVOLVEMENT: This treatment plan has been presented to and reviewed with the patient, [name],.  The patient and family have been given the opportunity to ask questions and make suggestions.  Delos Haring, RN 06/15/2020, 4:11 AM
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
## 2020-06-15 NOTE — Progress Notes (Signed)
   06/15/20 2025  Psych Admission Type (Psych Patients Only)  Admission Status Voluntary  Psychosocial Assessment  Patient Complaints Substance abuse  Eye Contact Brief  Facial Expression Anxious  Affect Irritable;Anxious  Speech Logical/coherent  Interaction Assertive  Motor Activity Slow  Appearance/Hygiene Unremarkable  Behavior Characteristics Anxious;Irritable  Mood Anxious;Irritable  Thought Process  Coherency Circumstantial  Content Preoccupation  Delusions None reported or observed  Perception WDL  Hallucination None reported or observed  Judgment Poor  Confusion None  Danger to Self  Current suicidal ideation? Passive (d/t withdrawal symptoms)  Agreement Not to Harm Self Yes  Description of Agreement verbal contract for safety  Danger to Others  Danger to Others None reported or observed   Pt experiencing lots of pain with withdrawal. Rates pain 10/10 generalized and anxiety 10/10. Pt in bed and offered Tylenol since it is not time for Robaxin 500 mg or Ibuprofen 500 mg. Pt stated, "I'll suffer through it."

## 2020-06-15 NOTE — Tx Team (Signed)
Interdisciplinary Treatment and Diagnostic Plan Update  06/15/2020 Time of Session:  Chase Moss MRN: [id number]  Principal Diagnosis: <principal problem not specified>  Secondary Diagnoses: Active Problems:   MDD (major depressive disorder), recurrent severe, without psychosis ([place])   Current Medications:  Current Facility-Administered Medications  Medication Dose Route Frequency Provider Last Rate Last Admin  . acetaminophen (TYLENOL) tablet 650 mg  650 mg Oral Q6H PRN Money, Lowry Ram, FNP      . alum & mag hydroxide-simeth (MAALOX/MYLANTA) 200-200-20 MG/5ML suspension 30 mL  30 mL Oral Q4H PRN Money, Lowry Ram, FNP      . ARIPiprazole (ABILIFY) tablet 5 mg  5 mg Oral Daily Money, Lowry Ram, FNP   5 mg at 06/15/20 7414  . buPROPion (WELLBUTRIN XL) 24 hr tablet 150 mg  150 mg Oral Daily Money, Lowry Ram, FNP   150 mg at 06/15/20 2395  . cloNIDine (CATAPRES) tablet 0.1 mg  0.1 mg Oral QID Money, Darnelle Maffucci B, FNP   0.1 mg at 06/15/20 3202   Followed by  . [START ON 06/17/2020] cloNIDine (CATAPRES) tablet 0.1 mg  0.1 mg Oral BH-qamhs Money, Lowry Ram, FNP       Followed by  . [START ON 06/20/2020] cloNIDine (CATAPRES) tablet 0.1 mg  0.1 mg Oral QAC breakfast Money, Darnelle Maffucci B, FNP      . dicyclomine (BENTYL) tablet 20 mg  20 mg Oral Q6H PRN Money, Lowry Ram, FNP      . hydrOXYzine (ATARAX/VISTARIL) tablet 25 mg  25 mg Oral Q6H PRN Money, Lowry Ram, FNP      . lisinopril (ZESTRIL) tablet 10 mg  10 mg Oral Daily Money, Lowry Ram, FNP   10 mg at 06/15/20 0853  . loperamide (IMODIUM) capsule 2-4 mg  2-4 mg Oral PRN Money, Lowry Ram, FNP      . magnesium hydroxide (MILK OF MAGNESIA) suspension 30 mL  30 mL Oral Daily PRN Money, Lowry Ram, FNP      . methocarbamol (ROBAXIN) tablet 500 mg  500 mg Oral Q8H PRN Money, Lowry Ram, FNP   500 mg at 06/15/20 0201  . mirtazapine (REMERON) tablet 7.5 mg  7.5 mg Oral QHS Money, Lowry Ram, FNP      . naproxen (NAPROSYN) tablet 500 mg  500 mg Oral BID PRN Money, Darnelle Maffucci B,  FNP      . ondansetron (ZOFRAN-ODT) disintegrating tablet 4 mg  4 mg Oral Q6H PRN Money, Lowry Ram, FNP      . pantoprazole (PROTONIX) EC tablet 40 mg  40 mg Oral Daily Money, Lowry Ram, FNP   40 mg at 06/15/20 0855  . prazosin (MINIPRESS) capsule 2 mg  2 mg Oral QHS Money, Lowry Ram, FNP   2 mg at 06/15/20 0201  . traZODone (DESYREL) tablet 50 mg  50 mg Oral QHS PRN Money, Lowry Ram, FNP       PTA Medications: Medications Prior to Admission  Medication Sig Dispense Refill Last Dose  . amLODipine (NORVASC) 5 MG tablet Take 1 tablet (5 mg total) by mouth daily. 30 tablet 0   . ARIPiprazole (ABILIFY) 5 MG tablet Take 1 tablet (5 mg total) by mouth daily. 30 tablet 0   . buPROPion (WELLBUTRIN XL) 150 MG 24 hr tablet Take 1 tablet (150 mg total) by mouth daily. 30 tablet 0   . dicyclomine (BENTYL) 20 MG tablet Take 1 tablet (20 mg total) by mouth 2 (two) times daily. 20 tablet 0   . hydrOXYzine (  ATARAX/VISTARIL) 25 MG tablet Take 1 tablet (25 mg total) by mouth every 6 (six) hours as needed (anxiety). 30 tablet 0   . ibuprofen (ADVIL) 400 MG tablet Take 1 tablet (400 mg total) by mouth every 6 (six) hours as needed. 30 tablet 0   . lisinopril (PRINIVIL,ZESTRIL) 10 MG tablet Take 1 tablet (10 mg total) by mouth daily. 30 tablet 0   . mirtazapine (REMERON) 7.5 MG tablet Take 1 tablet (7.5 mg total) by mouth at bedtime. 30 tablet 0   . ondansetron (ZOFRAN ODT) 8 MG disintegrating tablet Take 1 tablet (8 mg total) by mouth every 8 (eight) hours as needed for nausea or vomiting. 20 tablet 0   . pantoprazole (PROTONIX) 40 MG tablet Take 1 tablet (40 mg total) by mouth daily. For acid reflux 7 tablet 0   . prazosin (MINIPRESS) 2 MG capsule Take 1 capsule (2 mg total) by mouth at bedtime. 30 capsule 0   . traZODone (DESYREL) 50 MG tablet Take 1 tablet (50 mg total) by mouth at bedtime as needed for sleep. 30 tablet 0     Patient Stressors: Marital or family conflict Medication change or  noncompliance Substance abuse  Patient Strengths: Technical sales engineer for treatment/growth  Treatment Modalities: Medication Management, Group therapy, Case management,  1 to 1 session with clinician, Psychoeducation, Recreational therapy.   Physician Treatment Plan for Primary Diagnosis: <principal problem not specified> Long Term Goal(s): Improvement in symptoms so as ready for discharge Improvement in symptoms so as ready for discharge   Short Term Goals: Ability to identify changes in lifestyle to reduce recurrence of condition will improve Ability to verbalize feelings will improve Ability to disclose and discuss suicidal ideas Ability to identify and develop effective coping behaviors will improve Compliance with prescribed medications will improve Ability to identify triggers associated with substance abuse/mental health issues will improve  Medication Management: Evaluate patient's response, side effects, and tolerance of medication regimen.  Therapeutic Interventions: 1 to 1 sessions, Unit Group sessions and Medication administration.  Evaluation of Outcomes: Not Met  Physician Treatment Plan for Secondary Diagnosis: Active Problems:   MDD (major depressive disorder), recurrent severe, without psychosis ([place])  Long Term Goal(s): Improvement in symptoms so as ready for discharge Improvement in symptoms so as ready for discharge   Short Term Goals: Ability to identify changes in lifestyle to reduce recurrence of condition will improve Ability to verbalize feelings will improve Ability to disclose and discuss suicidal ideas Ability to identify and develop effective coping behaviors will improve Compliance with prescribed medications will improve Ability to identify triggers associated with substance abuse/mental health issues will improve     Medication Management: Evaluate patient's response, side effects, and tolerance of medication  regimen.  Therapeutic Interventions: 1 to 1 sessions, Unit Group sessions and Medication administration.  Evaluation of Outcomes: Not Met   RN Treatment Plan for Primary Diagnosis: <principal problem not specified> Long Term Goal(s): Knowledge of disease and therapeutic regimen to maintain health will improve  Short Term Goals: Ability to participate in decision making will improve, Ability to verbalize feelings will improve, Ability to disclose and discuss suicidal ideas, Ability to identify and develop effective coping behaviors will improve and Compliance with prescribed medications will improve  Medication Management: RN will administer medications as ordered by provider, will assess and evaluate patient's response and provide education to patient for prescribed medication. RN will report any adverse and/or side effects to prescribing provider.  Therapeutic Interventions: 1 on  1 counseling sessions, Psychoeducation, Medication administration, Evaluate responses to treatment, Monitor vital signs and CBGs as ordered, Perform/monitor CIWA, COWS, AIMS and Fall Risk screenings as ordered, Perform wound care treatments as ordered.  Evaluation of Outcomes: Not Met   LCSW Treatment Plan for Primary Diagnosis: <principal problem not specified> Long Term Goal(s): Safe transition to appropriate next level of care at discharge, Engage patient in therapeutic group addressing interpersonal concerns.  Short Term Goals: Engage patient in aftercare planning with referrals and resources  Therapeutic Interventions: Assess for all discharge needs, 1 to 1 time with Social worker, Explore available resources and support systems, Assess for adequacy in community support network, Educate family and significant other(s) on suicide prevention, Complete Psychosocial Assessment, Interpersonal group therapy.  Evaluation of Outcomes: Not Met   Progress in Treatment: Attending groups: No. New to  unit Participating in groups: No. Taking medication as prescribed: Yes. Toleration medication: Yes. Family/Significant other contact made: No, will contact:  if patient consents to collateral contacts Patient understands diagnosis: Yes. Discussing patient identified problems/goals with staff: Yes. Medical problems stabilized or resolved: Yes. Denies suicidal/homicidal ideation: Yes. Issues/concerns per patient self-inventory: No. Other:   New problem(s) identified: None   New Short Term/Long Term Goal(s): medication stabilization, elimination of SI thoughts, development of comprehensive mental wellness plan.    Patient Goals:    Discharge Plan or Barriers: Patient recently admitted. CSW will continue to follow and assess for appropriate referrals and possible discharge planning.     Reason for Continuation of Hospitalization: Depression Medication stabilization Suicidal ideation Other; describe Substance abuse  Estimated Length of Stay: 3-5 days  Attendees: Patient: 06/15/2020 9:36 AM  Physician:  06/15/2020 9:36 AM  Nursing:  06/15/2020 9:36 AM  RN Care Manager: 06/15/2020 9:36 AM  Social Worker: Radonna Ricker, LCSW 06/15/2020 9:36 AM  Recreational Therapist:  06/15/2020 9:36 AM  Other:  06/15/2020 9:36 AM  Other:  06/15/2020 9:36 AM  Other: 06/15/2020 9:36 AM    Scribe for Treatment Team: Marylee Floras, [place] 06/15/2020 9:36 AM
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
## 2020-06-15 NOTE — BHH Group Notes (Signed)
LCSW Group Therapy Notes  Type of Therapy and Topic: Group Therapy: Healthy Vs. Unhealthy Coping Strategies   Description of Group:  In this group, patients will be encouraged to explore their healthy and unhealthy coping strategics. Coping strategies are actions that we take to deal with stress, problems, or uncomfortable emotions in our daily lives. Each patient will be challenged to read some scenarios and discuss the unhealthy and healthy coping strategies within those scenarios. Also, each patient will be challenged to describe current healthy and unhealthy strategies that they use in their own lives and discuss the outcomes and barriers to those strategies. This group will be process-oriented, with patients participating in exploration of their own experiences as well as giving and receiving support and challenge from other group members.  Therapeutic Goals: 1. Patient will identify personal healthy and unhealthy coping strategies. 2. Patient will identify healthy and unhealthy coping strategies, in others, through scenarios.  3. Patient will identify expected outcomes of healthy and unhealthy coping strategies. 4. Patient will identify barriers to using healthy coping strategies.   Summary of Patient Progress: This is a worksheet based group. Individual feedback and the opportunity to process the content was provided.   Therapeutic Modalities:  Cognitive Behavioral Therapy Solution Focused Therapy Motivational Interviewing   Orrie Lascano, MSW, LCSW Clinical Social Worker [facility]  Phone: [phone]      

## 2020-06-15 NOTE — BHH Counselor (Signed)
CSW attempted to meet with patient to complete assessment. Patient found lying in bed with his head under the blankets. When CSW introduced self and role patient responded with "I can't talk now, I just hurt."  CSW will follow up at a later time.  Enid Cutter, MSW, LCSW-A Clinical Social Worker [facility]

## 2020-06-15 NOTE — Progress Notes (Signed)
   06/15/20 1500  Psych Admission Type (Psych Patients Only)  Admission Status Voluntary  Psychosocial Assessment  Patient Complaints Substance abuse  Eye Contact Brief  Facial Expression Anxious  Affect Irritable  Speech Logical/coherent  Interaction Sarcastic  Motor Activity Slow  Appearance/Hygiene Disheveled  Behavior Characteristics Unwilling to participate  Mood Anxious;Depressed  Aggressive Behavior  Effect No apparent injury  Thought Process  Coherency Circumstantial  Content Blaming others  Delusions WDL  Perception WDL  Hallucination None reported or observed  Judgment Poor  Confusion WDL  Danger to Self  Current suicidal ideation? Passive  Self-Injurious Behavior Some self-injurious ideation observed or expressed.  No lethal plan expressed   Agreement Not to Harm Self Yes  Description of Agreement verbal contract for safety  Danger to Others  Danger to Others None reported or observed

## 2020-06-16 DIAGNOSIS — F4310 Post-traumatic stress disorder, unspecified: Secondary | ICD-10-CM
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
## 2020-06-16 NOTE — Progress Notes (Signed)
Pt did not attend wrap-up group   

## 2020-06-16 NOTE — Progress Notes (Signed)
   06/16/20 1658  Vital Signs  Pulse Rate 63  BP 134/78  BP Method Automatic  Oxygen Therapy  SpO2 99 %   D:  Patient refused to come to the med window, but did take all of his meds in the morning. Patient isolated in his room in the morning, but came out in open areas in the afternoon, talked on the phone and watched tv. Patient denied SI/HI/AVH. Patient rated all over body pain 8/10, 500 mg of Naprosyn was given. Pt. Stated "Why do I have to take all of these meds all day, when I've been here before, I didn't have to." A:  Support and encouragement provided Routine safety checks conducted every 15 minutes. Patient  Informed to notify staff with any concerns.     R:Safety maintained.

## 2020-06-16 NOTE — Progress Notes (Signed)
   06/16/20 2045  Psych Admission Type (Psych Patients Only)  Admission Status Voluntary  Psychosocial Assessment  Patient Complaints Substance abuse  Eye Contact Brief  Facial Expression Anxious  Affect Irritable;Anxious  Speech Logical/coherent  Interaction Assertive  Motor Activity Slow  Appearance/Hygiene Unremarkable  Behavior Characteristics Cooperative;Anxious  Mood Depressed;Anxious;Irritable  Thought Administrator, sports thinking  Content Preoccupation (with withdrawal symptoms)  Delusions None reported or observed  Perception WDL  Hallucination None reported or observed  Judgment Poor  Confusion None  Danger to Self  Current suicidal ideation? Passive  Agreement Not to Harm Self Yes  Description of Agreement verbal contract for safety  Danger to Others  Danger to Others None reported or observed   Pt states that he is experiencing the following withdrawal symptoms: runny nose, loose stools, muscle soreness, pain, indigestion and anxiety. COWS=7 this evening. Pt given appropriate PRNs. Pt rates pain 7/10 and anxiety 7/10. Pt endorses passive SI but contracts for safety.

## 2020-06-16 NOTE — Progress Notes (Signed)
[facility] MD Progress Note  06/16/2020 1:41 PM Corry Ihnen  MRN:  [id number]   Subjective: Roverto reports, "I'm still hurting pretty bad. My mood is not good. I still feel like hurting myself".   Objective: Patient is a 54 year old male who presented to the [facility] behavioral [facility] center with suicidal ideation as well as requesting opiate detoxification. The patient reported he had last used heroin on 06/13/2020. He admitted that he had used methamphetamines 3 days ago. He stated he was using approximately $30 of heroin at twice a day. He stated he is currently homeless after using all of his money on drugs. It was unable to afford to stay at the long-term motel. He was living with his brother-in-law until January, and had an altercation with him and was evicted.  Cher is seen, chart reviewed. The chart findings discussed with the treatment team. He presents alert, oriented & aware of situation. He is lying down in bed as he did yesterday. He is complaining of hurting pretty bad. He says his mood is not good. He says he still feels like hurting himself. However, denies any plans or intent to hurt himself or anyone else. He rarely comes out of his room. He gets irritated when encouraged to come out of his room. He denies any HI, AVH, delusional thoughts or paranoia. He does not appear to be responding to any internal stimuli. Patient is in agreement to continue his current plan of care as already in progress.  Principal Problem: Bipolar I disorder (HCC)  Diagnosis:   Patient Active Problem List   Diagnosis Date Noted  . Bipolar I disorder (HCC) [F31.9] 02/11/2018    Priority: High  . PTSD (post-traumatic stress disorder) [F43.10]     Priority: Medium  . MDD (major depressive disorder), recurrent severe, without psychosis (HCC) [F33.2] 06/15/2020  . Opioid dependence with opioid-induced mood disorder (HCC) [F11.24] 06/14/2020  . MDD (major depressive disorder), recurrent episode,  severe (HCC) [F33.2] 09/10/2018  . Moderate bipolar I disorder, most recent episode depressed (HCC) [F31.32] 07/07/2018  . Homicidal ideation [R45.850]   . Alcohol abuse with alcohol-induced mood disorder (HCC) [F10.14] 03/29/2018  . Cannabis use disorder, mild, abuse [F12.10] 02/12/2018  . MDD (major depressive disorder) [F32.9] 12/11/2017  . Intentional overdose of drug in tablet form (HCC) [T50.902A] 11/08/2017  . Dyslipidemia [E78.5] 11/08/2017  . Constipation [K59.00] 11/08/2017  . Drug overdose [T50.901A] 11/05/2017  . Acute alcoholic intoxication without complication (HCC) [F10.920]   . Suicidal ideation [R45.851]   . Alcohol intoxication (HCC) [F10.929] 10/22/2017  . Alcohol withdrawal (HCC) [F10.239] 10/22/2017  . Polysubstance abuse (HCC) [F19.10]   . 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 Cocaine abuse with cocaine-induced mood disorder (HCC) [F14.14] 04/01/2017  . HTN (hypertension) [I10] 06/20/2016  . Tobacco use disorder [F17.200] 06/20/2016  . Trauma [T14.90XA] 07/27/2015  . Cannabis use disorder, moderate, dependence (HCC) [F12.20]   . Substance induced mood disorder (HCC) [F19.94] 07/07/2014  . Alcohol use disorder, moderate, dependence (HCC) [F10.20] 04/10/2012  . Suicidal thoughts [R45.851] 04/04/2012   Total Time spent with patient: 25 minutes  Past Psychiatric History: See H&P  Past Medical History:  Past Medical History:  Diagnosis Date  . Bipolar 1 disorder (HCC)   . Depression   . Hypertension   . PTSD (post-traumatic stress disorder)    History reviewed. No pertinent surgical history. Family History:  Family History  Problem Relation Age of Onset  . Heart attack Other  Family Psychiatric  History: See H&P  Social History:  Social History   Substance and Sexual Activity  Alcohol Use Not Currently     Social History   Substance and Sexual Activity  Drug Use Yes  . Types: Marijuana,  Methamphetamines   Comment: heroin    Social History   Socioeconomic History  . Marital status: Divorced    Spouse name: Not on file  . Number of children: Not on file  . Years of education: Not on file  . Highest education level: Not on file  Occupational History  . Not on file  Tobacco Use  . Smoking status: Current Every Day Smoker    Packs/day: 0.25    Years: 30.00    Pack years: 7.50    Types: Cigarettes  . Smokeless tobacco: Never Used  Vaping Use  . Vaping Use: Never used  Substance and Sexual Activity  . Alcohol use: Not Currently  . Drug use: Yes    Types: Marijuana, Methamphetamines    Comment: heroin  . Sexual activity: Yes  Other Topics Concern  . Not on file  Social History Narrative  . Not on file   Social Determinants of Health   Financial Resource Strain:   . Difficulty of Paying Living Expenses:   Food Insecurity:   . Worried About Charity fundraiser in the Last Year:   . Arboriculturist in the Last Year:   Transportation Needs:   . Film/video editor (Medical):   Marland Kitchen Lack of Transportation (Non-Medical):   Physical Activity:   . Days of Exercise per Week:   . Minutes of Exercise per Session:   Stress:   . Feeling of Stress :   Social Connections:   . Frequency of Communication with Friends and Family:   . Frequency of Social Gatherings with Friends and Family:   . Attends Religious Services:   . Active Member of Clubs or Organizations:   . Attends Archivist Meetings:   Marland Kitchen Marital Status:    Additional Social History:   Sleep: Good  Appetite:  Good  Current Medications: Current Facility-Administered Medications  Medication Dose Route Frequency Provider Last Rate Last Admin  . acetaminophen (TYLENOL) tablet 650 mg  650 mg Oral Q6H PRN Money, Lowry Ram, FNP   650 mg at 06/15/20 2131  . alum & mag hydroxide-simeth (MAALOX/MYLANTA) 200-200-20 MG/5ML suspension 30 mL  30 mL Oral Q4H PRN Money, Lowry Ram, FNP      . ARIPiprazole  (ABILIFY) tablet 5 mg  5 mg Oral Daily Money, Lowry Ram, FNP   5 mg at 06/16/20 0904  . buPROPion (WELLBUTRIN XL) 24 hr tablet 150 mg  150 mg Oral Daily Money, Lowry Ram, FNP   150 mg at 06/16/20 2426  . chlordiazePOXIDE (LIBRIUM) capsule 25 mg  25 mg Oral Q6H PRN Sharma Covert, MD      . cloNIDine (CATAPRES) tablet 0.1 mg  0.1 mg Oral QID Money, Darnelle Maffucci B, FNP   0.1 mg at 06/16/20 1231   Followed by  . [START ON 06/17/2020] cloNIDine (CATAPRES) tablet 0.1 mg  0.1 mg Oral BH-qamhs Money, Lowry Ram, FNP       Followed by  . [START ON 06/20/2020] cloNIDine (CATAPRES) tablet 0.1 mg  0.1 mg Oral QAC breakfast Money, Darnelle Maffucci B, FNP      . dicyclomine (BENTYL) tablet 20 mg  20 mg Oral Q6H PRN Money, Lowry Ram, FNP      .  folic acid (FOLVITE) tablet 1 mg  1 mg Oral Daily Antonieta Pert, MD   1 mg at 06/16/20 0908  . hydrOXYzine (ATARAX/VISTARIL) tablet 25 mg  25 mg Oral Q6H PRN Money, Gerlene Burdock, FNP      . lisinopril (ZESTRIL) tablet 10 mg  10 mg Oral Daily Money, Gerlene Burdock, FNP   10 mg at 06/16/20 0909  . loperamide (IMODIUM) capsule 2-4 mg  2-4 mg Oral PRN Money, Gerlene Burdock, FNP      . magnesium hydroxide (MILK OF MAGNESIA) suspension 30 mL  30 mL Oral Daily PRN Money, Feliz Beam B, FNP      . methocarbamol (ROBAXIN) tablet 500 mg  500 mg Oral Q8H PRN Money, Gerlene Burdock, FNP   500 mg at 06/15/20 1653  . mirtazapine (REMERON) tablet 7.5 mg  7.5 mg Oral QHS Money, Travis B, FNP   7.5 mg at 06/15/20 2131  . naproxen (NAPROSYN) tablet 500 mg  500 mg Oral BID PRN Money, Gerlene Burdock, FNP   500 mg at 06/16/20 0917  . ondansetron (ZOFRAN-ODT) disintegrating tablet 4 mg  4 mg Oral Q6H PRN Money, Gerlene Burdock, FNP      . pantoprazole (PROTONIX) EC tablet 40 mg  40 mg Oral Daily Money, Gerlene Burdock, FNP   40 mg at 06/16/20 0909  . prazosin (MINIPRESS) capsule 2 mg  2 mg Oral QHS Money, Gerlene Burdock, FNP   2 mg at 06/15/20 2215  . thiamine tablet 100 mg  100 mg Oral Daily Antonieta Pert, MD   100 mg at 06/16/20 0910  . traZODone  (DESYREL) tablet 50 mg  50 mg Oral QHS PRN Money, Gerlene Burdock, FNP       Lab Results:  Results for orders placed or performed during the hospital encounter of 06/14/20 (from the past 48 hour(s))  Comprehensive metabolic panel     Status: Abnormal   Collection Time: 06/14/20  4:14 PM  Result Value Ref Range   Sodium 137 135 - 145 mmol/L   Potassium 3.1 (L) 3.5 - 5.1 mmol/L   Chloride 99 98 - 111 mmol/L   CO2 27 22 - 32 mmol/L   Glucose, Bld 93 70 - 99 mg/dL    Comment: Glucose reference range applies only to samples taken after fasting for at least 8 hours.   BUN 15 6 - 20 mg/dL   Creatinine, Ser 0.93 0.61 - 1.24 mg/dL   Calcium 8.6 (L) 8.9 - 10.3 mg/dL   Total Protein 5.8 (L) 6.5 - 8.1 g/dL   Albumin 3.5 3.5 - 5.0 g/dL   AST 19 15 - 41 U/L   ALT 17 0 - 44 U/L   Alkaline Phosphatase 85 38 - 126 U/L   Total Bilirubin 0.9 0.3 - 1.2 mg/dL   GFR calc non Af Amer >60 >60 mL/min   GFR calc Af Amer >60 >60 mL/min   Anion gap 11 5 - 15    Comment: Performed at [facility], [address] [place], [place] [address]  Ethanol     Status: None   Collection Time: 06/14/20  4:14 PM  Result Value Ref Range   Alcohol, Ethyl (B) <10 <10 mg/dL    Comment: (NOTE) Lowest detectable limit for serum alcohol is 10 mg/dL.  For medical purposes only. Performed at [facility], [address], [place], [place] [address]   TSH     Status: Abnormal   Collection Time: 06/14/20  4:14 PM  Result Value Ref Range   TSH 0.261 (L) 0.350 - 4.500 uIU/mL    Comment: Performed by a 3rd Generation assay with a functional sensitivity of <=0.01 uIU/mL. Performed at [facility], [address], [place], [place] [address]   Lipid panel     Status: None   Collection Time: 06/14/20  4:14 PM  Result Value Ref Range   Cholesterol 145 0 - 200 mg/dL   Triglycerides 75 <650 mg/dL   HDL 50 >35 mg/dL   Total CHOL/HDL Ratio 2.9 RATIO   VLDL 15 0 - 40 mg/dL   LDL Cholesterol 80 0 - 99 mg/dL     Comment:        Total Cholesterol/HDL:CHD Risk Coronary Heart Disease Risk Table                     Men   Women  1/2 Average Risk   3.4   3.3  Average Risk       5.0   4.4  2 X Average Risk   9.6   7.1  3 X Average Risk  23.4   11.0        Use the calculated Patient Ratio above and the CHD Risk Table to determine the patient's CHD Risk.        ATP III CLASSIFICATION (LDL):  <100     mg/dL   Optimal  465-681  mg/dL   Near or Above                    Optimal  130-159  mg/dL   Borderline  275-170  mg/dL   High  >017     mg/dL   Very High Performed at [facility], [address] [place], [place] [address]   POC SARS Coronavirus 2 Ag     Status: None   Collection Time: 06/14/20  5:01 PM  Result Value Ref Range   SARS Coronavirus 2 Ag NEGATIVE NEGATIVE    Comment: Performed at [facility], [address], [place], [place] [address]  POCT Urine Drug Screen - (ICup)     Status: Abnormal   Collection Time: 06/14/20  8:11 PM  Result Value Ref Range   POC Amphetamine UR None Detected None Detected   POC Secobarbital (BAR) None Detected None Detected   POC Buprenorphine (BUP) None Detected None Detected   POC Oxazepam (BZO) None Detected None Detected   POC Cocaine UR None Detected None Detected   POC Methamphetamine UR None Detected None Detected   POC Morphine None Detected None Detected   POC Oxycodone UR None Detected None Detected   POC Methadone UR None Detected None Detected   POC Marijuana UR Positive (A) None Detected   Blood Alcohol level:  Lab Results  Component Value Date   ETH <10 06/14/2020   ETH <10 10/06/2018   Metabolic Disorder Labs: Lab Results  Component Value Date   HGBA1C 5.4 07/16/2018   MPG 108.28 07/16/2018   MPG 100 04/09/2012   Lab Results  Component Value Date   PROLACTIN 21.0 (H) 06/21/2016   Lab Results  Component Value Date   CHOL 145 06/14/2020   TRIG 75 06/14/2020   HDL 50 06/14/2020   CHOLHDL 2.9 06/14/2020   VLDL  15 06/14/2020   LDLCALC 80 06/14/2020   LDLCALC 153 (H) 07/16/2018   Physical Findings: AIMS:  , ,  ,  ,    CIWA:  COWS:  COWS Total Score: 5  Musculoskeletal: Strength & Muscle Tone: within normal limits Gait & Station: normal Patient leans: N/A  Psychiatric Specialty Exam: Physical Exam  Nursing note and vitals reviewed. Constitutional: He is oriented to person, place, and time. He appears well-developed.  HENT:  Head: Normocephalic.  Nose: Nose normal.  Mouth/Throat: Oropharynx is clear.  Cardiovascular: Normal rate.  Respiratory: Effort normal.  Genitourinary:    Genitourinary Comments: Deferred   Musculoskeletal:        General: Normal range of motion.     Cervical back: Normal range of motion.  Neurological: He is alert and oriented to person, place, and time.  Skin: Skin is warm and dry.    Review of Systems  Constitutional: Negative.   HENT: Negative.   Eyes: Negative.   Respiratory: Negative for cough, shortness of breath and wheezing.   Cardiovascular: Negative for chest pain and palpitations.  Gastrointestinal: Negative for diarrhea, heartburn, nausea and vomiting.  Genitourinary: Negative.   Musculoskeletal: Positive for joint pain and myalgias.  Skin: Negative.   Neurological: Negative.  Negative for dizziness, tremors, seizures and headaches.  Endo/Heme/Allergies: Negative.        Allergies: NKDA  Psychiatric/Behavioral: Positive for depression and substance abuse (Hx. THC use disorder.). Negative for hallucinations, memory loss and suicidal ideas. The patient is nervous/anxious. The patient does not have insomnia.   denies chest pain, no shortness of breath, no vomiting   Blood pressure 137/69, pulse (!) 58, temperature 98 F (36.7 C), temperature source Oral, resp. rate 18, height 5' 7.32" (1.71 m), weight 59.4 kg, SpO2 100 %.Body mass index is 20.32 kg/m.  General Appearance:Disheveled  Eye Contact:Minimal  Speech:Normal Rate   Volume:Decreased  Mood:Anxious, Depressed and Dysphoric  Affect:Congruent  Thought Process:Coherent and Descriptions of Associations:Circumstantial  Orientation:Full (Time, Place, and Person)  Thought Content:Logical  Suicidal Thoughts:Yes.without intent/plan  Homicidal Thoughts:No  Memory:Immediate;Poor Recent;Poor Remote;Poor  Judgement:Impaired  Insight:Lacking  Psychomotor Activity:Increased  Concentration:Concentration:Fairand Attention Span: Fair  Recall:Fair  Fund of Knowledge:Fair  Language:Good  Akathisia:Negative  Handed:Right  AIMS (if indicated):   Assets:Desire for Improvement Resilience  ADL's:Intact  Cognition:WNL    Sleep:  Number of Hours: 6.75   Treatment Plan Summary: Daily contact with patient to assess and evaluate symptoms and progress in treatment and Medication management.  - Continue inpatient hospitalization. - Will continue today 06/16/2020 plan as below except where it is noted.  Mood control.    - Continue Abilify 5 mg po daily.  Depression.    - Continue Wellbutrin XL 150 mg po Q daily.  Alcohol/Opioid withdrawal/detox.    - Continue Librium 25 mg po Q 6 hrs prn.    - Continue the Clonidine detox protocols as already in progress.  PTSD symptoms.    - Continue Minipress 2 mg po Q hs.  Insomnia.     - Continue Trazodone 50 mg po Q hs prn.     - Continue Mirtazapine 7.5 mg po Q hs.  Other medical issues.     - Continue Lisinopril 10 mg po Q daily for HTN.      - Continue Protonix 40 mg po Q am for GERD.  Encourage group participation. Discharge disposition in progress.  Armandina StammerAgnes Marleena Shubert, NP, PMHNP, FNP-BC. 06/16/2020, 1:41 PM Patient ID: Fredna DowGregory Sochacki, male   DOB: 11/30/1966, 54 y.o.   MRN: [id number]

## 2020-06-17 MED ORDER — BUPROPION HCL ER (XL) 150 MG PO TB24: 150.0000 mg | ORAL_TABLET | Freq: Every day | ORAL | 0 refills | Status: DC

## 2020-06-17 MED ORDER — LISINOPRIL 10 MG PO TABS: 10.0000 mg | ORAL_TABLET | Freq: Every day | ORAL | 0 refills | Status: DC

## 2020-06-17 MED ORDER — MIRTAZAPINE 7.5 MG PO TABS: 7.5000 mg | ORAL_TABLET | Freq: Every day | ORAL | 0 refills | Status: DC

## 2020-06-17 MED ORDER — ARIPIPRAZOLE 5 MG PO TABS: 5.0000 mg | ORAL_TABLET | Freq: Every day | ORAL | 0 refills | Status: DC

## 2020-06-17 MED ORDER — HYDROXYZINE HCL 25 MG PO TABS: 25.0000 mg | ORAL_TABLET | Freq: Four times a day (QID) | ORAL | 0 refills | Status: DC | PRN

## 2020-06-17 MED ORDER — TRAZODONE HCL 50 MG PO TABS: 50.0000 mg | ORAL_TABLET | Freq: Every evening | ORAL | 0 refills | Status: DC | PRN

## 2020-06-17 MED ORDER — PANTOPRAZOLE SODIUM 40 MG PO TBEC: 40.0000 mg | DELAYED_RELEASE_TABLET | Freq: Every day | ORAL | 0 refills | Status: DC

## 2020-06-17 MED ORDER — PRAZOSIN HCL 2 MG PO CAPS: 2.0000 mg | ORAL_CAPSULE | Freq: Every day | ORAL | 0 refills | Status: DC

## 2020-06-17 NOTE — Progress Notes (Signed)
Recreation Therapy Notes  Date:  6.25.21 Time: 0930 Location: [address] Dayroom  Group Topic: Stress Management  Goal Area(s) Addresses:  Patient will identify positive stress management techniques. Patient will identify benefits of using stress management post d/c.  Intervention: Stress Management  Activity : Guided Imagery.  LRT read a script that allowed patients to envision their peaceful place.  Patients were to listen and follow along as script was read to engage in activity.  Education:  Stress Management, Discharge Planning.   Education Outcome: Acknowledges Education  Clinical Observations/Feedback: Pt did not attend group activity.    Caroll Rancher, LRT/CTRS         Caroll Rancher A 06/17/2020 11:55 AM

## 2020-06-17 NOTE — BHH Suicide Risk Assessment (Signed)
[facility] Discharge Suicide Risk Assessment   Principal Problem: Bipolar I disorder [facility]) Discharge Diagnoses: Principal Problem:   Bipolar I disorder (HCC) Active Problems:   PTSD (post-traumatic stress disorder)   MDD (major depressive disorder), recurrent severe, without psychosis (HCC)   Total Time spent with patient: 20 minutes  Musculoskeletal: Strength & Muscle Tone: within normal limits Gait & Station: normal Patient leans: N/A  Psychiatric Specialty Exam: Review of Systems  All other systems reviewed and are negative.   Blood pressure (!) 151/88, pulse 66, temperature 98 F (36.7 C), temperature source Oral, resp. rate 18, height 5' 7.32" (1.71 m), weight 59.4 kg, SpO2 99 %.Body mass index is 20.32 kg/m.  General Appearance: Disheveled  Eye Solicitor::  Fair  Speech:  Normal Rate409  Volume:  Normal  Mood:  Euthymic  Affect:  Congruent  Thought Process:  Coherent and Descriptions of Associations: Intact  Orientation:  Full (Time, Place, and Person)  Thought Content:  Logical  Suicidal Thoughts:  No  Homicidal Thoughts:  No  Memory:  Immediate;   Fair Recent;   Fair Remote;   Fair  Judgement:  Intact  Insight:  Fair  Psychomotor Activity:  Normal  Concentration:  Fair  Recall:  [employer] of Knowledge:Fair  Language: Good  Akathisia:  Negative  Handed:  Right  AIMS (if indicated):     Assets:  Desire for Improvement Resilience  Sleep:  Number of Hours: 6.25  Cognition: WNL  ADL's:  Intact   Mental Status Per Nursing Assessment::   On Admission:  Suicidal ideation indicated by patient  Demographic Factors:  Male, Divorced or widowed, Caucasian, Low socioeconomic status, Living alone and Unemployed  Loss Factors: NA  Historical Factors: Impulsivity  Risk Reduction Factors:   Positive social support  Continued Clinical Symptoms:  Alcohol/Substance Abuse/Dependencies  Cognitive Features That Contribute To Risk:  None    Suicide Risk:  Minimal:  No identifiable suicidal ideation.  Patients presenting with no risk factors but with morbid ruminations; may be classified as minimal risk based on the severity of the depressive symptoms   Follow-up Information    [facility] Follow up.   Specialty: Addiction Medicine Why: A referral to this provider has been made on your behalf. Contact information: [address] [place] [address] [phone]        [facility] Follow up.   Why: A referral to this provider has been made on your behalf. Contact information: [address] [place] [address] [phone]        [facility] Follow up.   Specialty: Behavioral Health Why: A referral has been made to this provider for medication management and therapy services.   Contact information: [address] [place] [address] [phone]       [facility] Follow up.   Why: A referral to this provider has been made on your behalf. Contact information: [address] [place] [address] [phone]               Plan Of Care/Follow-up recommendations:  Activity:  ad lib  Antonieta Pert, MD 06/17/2020, 12:29 PM

## 2020-06-17 NOTE — Progress Notes (Signed)
Discharge Note:   Pt discharged via [employer] at 1330; upon discharge to home. Pt upon discharge is alert and oriented to person, place, time and situation. Pt is calm, cooperative, given discharge instructions, which include pt's outpatient follow up appointments, discharge printed medication prescriptions, discharge medication education, and samples; pt verbalized understanding of all. All personal belongings returned to pt upon discharge.

## 2020-06-17 NOTE — Progress Notes (Signed)
  [facility] Adult Case Management Discharge Plan :  Will you be returning to the same living situation after discharge:  No. Patient is discharging to his friend's home. Patient given homeless/shelter resources as well due to intermittent homelessness.  At discharge, do you have transportation home?: Yes,  CSW arranged Safe Transport Do you have the ability to pay for your medications: Yes,  Medicaid  Release of information consent forms completed and in the chart;  Patient's signature needed at discharge.  Patient to Follow up at:  Follow-up Information    [facility]. Call on 06/28/2020.   Specialty: Behavioral Health Why: Your appointment for medication management is 06/28/20 at 11:30am with Dr. Kathlene November. Please be sure to bring your discharge paperwork from this hospitalzation, including your list of medications. Please inquire about therapy services, if interested.  Contact information: [address] [place] [address] [phone]              Next level of care provider has access to [facility] Link:yes  Safety Planning and Suicide Prevention discussed: Yes,  with the patient     Has patient been referred to the Quitline?: Patient refused referral  Patient has been referred for addiction treatment: Pt. refused referral  Maeola Sarah, LCSWA 06/17/2020, 1:32 PM

## 2020-06-17 NOTE — Discharge Summary (Signed)
Physician Discharge Summary Note  Patient:  Chase Moss is an 54 y.o., male  MRN:  [id number]  DOB:  Mar 04, 1966  Patient phone:  [phone] (home)   Patient address:   [place] [address],   Total Time spent with patient: Greater than 30 minutes  Date of Admission:  06/15/2020  Date of Discharge: 05-22-17  Reason for Admission: Suicidal ideation & opioid withdrawal symptoms.  Principal Problem: Bipolar I disorder [facility])  Discharge Diagnoses: Patient Active Problem List   Diagnosis Date Noted  . Bipolar I disorder (HCC) [F31.9] 02/11/2018    Priority: High  . PTSD (post-traumatic stress disorder) [F43.10]     Priority: Medium  . MDD (major depressive disorder), recurrent severe, without psychosis (HCC) [F33.2] 06/15/2020  . Opioid dependence with opioid-induced mood disorder (HCC) [F11.24] 06/14/2020  . MDD (major depressive disorder), recurrent episode, severe (HCC) [F33.2] 09/10/2018  . Moderate bipolar I disorder, most recent episode depressed (HCC) [F31.32] 07/07/2018  . Homicidal ideation [R45.850]   . Alcohol abuse with alcohol-induced mood disorder (HCC) [F10.14] 03/29/2018  . Cannabis use disorder, mild, abuse [F12.10] 02/12/2018  . MDD (major depressive disorder) [F32.9] 12/11/2017  . Intentional overdose of drug in tablet form (HCC) [T50.902A] 11/08/2017  . Dyslipidemia [E78.5] 11/08/2017  . Constipation [K59.00] 11/08/2017  . Drug overdose [T50.901A] 11/05/2017  . Acute alcoholic intoxication without complication (HCC) [F10.920]   . Suicidal ideation [R45.851]   . Alcohol intoxication (HCC) [F10.929] 10/22/2017  . Alcohol withdrawal (HCC) [F10.239] 10/22/2017  . Polysubstance abuse (HCC) [F19.10]   . 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 Cocaine abuse with cocaine-induced mood disorder (HCC) [F14.14] 04/01/2017  . HTN (hypertension) [I10] 06/20/2016  . Tobacco use disorder [F17.200]  06/20/2016  . Trauma [T14.90XA] 07/27/2015  . Cannabis use disorder, moderate, dependence (HCC) [F12.20]   . Substance induced mood disorder (HCC) [F19.94] 07/07/2014  . Alcohol use disorder, moderate, dependence (HCC) [F10.20] 04/10/2012  . Suicidal thoughts [R45.851] 04/04/2012   Past Psychiatric History: Hx. Polysubstance dependence, PTSD, MDD  Past Medical History:  Past Medical History:  Diagnosis Date  . Bipolar 1 disorder (HCC)   . Depression   . Hypertension   . PTSD (post-traumatic stress disorder)    History reviewed. No pertinent surgical history. Family History:  Family History  Problem Relation Age of Onset  . Heart attack Other    Family Psychiatric  History: See H&P  Social History:  Social History   Substance and Sexual Activity  Alcohol Use Not Currently     Social History   Substance and Sexual Activity  Drug Use Yes  . Types: Marijuana, Methamphetamines   Comment: heroin    Social History   Socioeconomic History  . Marital status: Divorced    Spouse name: Not on file  . Number of children: Not on file  . Years of education: Not on file  . Highest education level: Not on file  Occupational History  . Not on file  Tobacco Use  . Smoking status: Current Every Day Smoker    Packs/day: 0.25    Years: 30.00    Pack years: 7.50    Types: Cigarettes  . Smokeless tobacco: Never Used  Vaping Use  . Vaping Use: Never used  Substance and Sexual Activity  . Alcohol use: Not Currently  . Drug use: Yes    Types: Marijuana, Methamphetamines    Comment: heroin  . Sexual activity: Yes  Other Topics Concern  . Not on  file  Social History Narrative  . Not on file   Social Determinants of Health   Financial Resource Strain:   . Difficulty of Paying Living Expenses:   Food Insecurity:   . Worried About Programme researcher, broadcasting/film/videounning Out of Food in the Last Year:   . Baristaan Out of Food in the Last Year:   Transportation Needs:   . Freight forwarderLack of Transportation (Medical):   Marland Kitchen.  Lack of Transportation (Non-Medical):   Physical Activity:   . Days of Exercise per Week:   . Minutes of Exercise per Session:   Stress:   . Feeling of Stress :   Social Connections:   . Frequency of Communication with Friends and Family:   . Frequency of Social Gatherings with Friends and Family:   . Attends Religious Services:   . Active Member of Clubs or Organizations:   . Attends BankerClub or Organization Meetings:   Marland Kitchen. Marital Status:    Hospital Course: (Per Md's admission assessment notes):  Patient is a 54 year old male who [facility] with suicidal ideation as well as requesting opiate detoxification. The patient reported he had last used heroin on 06/13/2020. He admitted that he had used methamphetamines 3 days ago. He stated he was using approximately $30 of heroin at twice a day. He stated he is currently homeless after using all of his money on drugs. It was unable to afford to stay at the long-term motel. He was living with his brother-in-law until January, and had an altercation with him and was evicted. His last admission to our facility was on 09/10/2018. He was diagnosed with moderate bipolar disorder at that time. His discharge medications included amlodipine, Abilify, Wellbutrin, hydroxyzine, lisinopril, mirtazapine, Protonix, prazosin and trazodone. Notes in the chart also revealed alcohol abuse. He had [facility] on 04/13/2020 secondary to alcohol abuse. He stated at that time he needed to rest. He admitted that he currently has legal charges against him, and had a court date on June 11, but apparently that was rescheduled. Surprisingly on admission his blood alcohol was less than 10, drug screen was negative except for marijuana. He was admitted to the hospital for evaluation and stabilization.  This is yet, another psychiatric discharge summaries for this 54 year old  male with hx of chronic mental illness, polysubstance use disorder & multiple psychiatric admissions. [facility] for worsening symptoms of Bipolar disorder & substance intoxications requiring detoxification treatments. He has been tried on multiple psychotropic medications for his symptoms & it appeared Chase LitesGregory has not compliant to his medication regimen. He was discharged to a substance abuse treatment program called [facility] the [facility] time [facility] was [facility]. [facility] this [facility] around for evaluation & treatment for opioid withdrawal symptoms & mood stabilization treatments.  After evaluation of his presenting symptoms, Chase LitesGregory was recommended for mood detoxification & stabilization treatments. The medication regimen for his presenting symptoms were discussed & with his consent initiated. He received, stabilized & was discharged on the medications as listed below on his discharge medication lists. He also received Clonidine detox protocols for opioid detox. He was also enrolled, but seldom participated in the group counseling sessions being offered & held on this unit. He was suppose to learn coping skills. He presented on this admission, other chronic medical conditions that required treatment & monitoring. He was treated, stabilized & discharged on  the medications for those health issues. He tolerated his treatment regimen without any adverse effects or reactions reported.  During the course of his hospitalization, the 15-minute checks were adequate to ensure his safety.  Patient did not display any dangerous, violent or suicidal behavior on the unit. He interacted with staff appropriately. His medications were addressed & adjusted to meet his needs. He was recommended for outpatient follow-up care & medication management upon discharge to assure his continuity of care.  At the time of discharge, patient is not reporting any acute suicidal/homicidal  ideations. He feels more confident about his self-care & in managing his symptoms. He currently denies any new issues or concerns. Education and supportive counseling provided throughout her hospital stay & upon discharge.  Today upon his discharge evaluation with the attending psychiatrist, Chase Moss shares he is doing well. He denies any other specific concerns. He is sleeping well. His appetite is good. He denies other physical complaints. He denies AH/VH. He feels that his medications have been helpful & is in agreement to continue his current treatment regimen as recommended. He says he will leaving [place], [place] this evening with his significant other to move to [place]. He says he will continue mental health care in [place]. He was able to engage in safety planning including plan to return to [facility] or contact emergency services if he feels unable to maintain his own safety or the safety of others. Pt had no further questions, comments, or concerns. He left [facility] with all personal belongings in no apparent distress. Transportation per the [employer].  Physical Findings: AIMS:  , ,  ,  ,    CIWA:    COWS:  COWS Total Score: 7  Musculoskeletal: Strength & Muscle Tone: within normal limits Gait & Station: normal Patient leans: N/A  Psychiatric Specialty Exam: Physical Exam Constitutional:      Appearance: He is well-developed.  HENT:     Head: Normocephalic.  Eyes:     Pupils: Pupils are equal, round, and reactive to light.  Cardiovascular:     Rate and Rhythm: Normal rate.  Pulmonary:     Effort: Pulmonary effort is normal.  Abdominal:     Palpations: Abdomen is soft.  Genitourinary:    Comments: Deferred Musculoskeletal:        General: Normal range of motion.     Cervical back: Normal range of motion.  Skin:    General: Skin is warm.  Neurological:     Mental Status: He is alert and oriented to person, place, and time.   Physical Exam  Constitutional:  He is oriented to person, place, and time. He appears well-developed.  HENT:  Head: Normocephalic.  Eyes: Pupils are equal, round, and reactive to light.  Cardiovascular: Normal rate.  Respiratory: Effort normal.  GI: Soft.  Genitourinary:    Genitourinary Comments: Deferred   Musculoskeletal:        General: Normal range of motion.     Cervical back: Normal range of motion.  Neurological: He is alert and oriented to person, place, and time.  Skin: Skin is warm.    Review of Systems  Constitutional: Negative.   HENT: Negative.   Eyes: Negative.   Respiratory: Negative.   Cardiovascular: Negative.   Gastrointestinal: Negative.   Genitourinary: Negative.   Musculoskeletal: Negative.   Skin: Negative.   Endo/Heme/Allergies: Negative.   Psychiatric/Behavioral: Positive for depression (Stable) and substance abuse (Hx. polysubstance dependence (Stable)). Negative for hallucinations, memory loss and suicidal ideas. The  patient has insomnia (Stable ). The patient is not nervous/anxious.     Blood pressure (!) 151/88, pulse 66, temperature 98 F (36.7 C), temperature source Oral, resp. rate 18, height 5' 7.32" (1.71 m), weight 59.4 kg, SpO2 99 %.Body mass index is 20.32 kg/m.  See Md's SRA   Has this patient used any form of tobacco in the last 30 days? (Cigarettes, Smokeless Tobacco, Cigars, and/or Pipes):  No  Blood Alcohol level:  Lab Results  Component Value Date   ETH <10 06/14/2020   ETH <10 10/06/2018   Metabolic Disorder Labs:  Lab Results  Component Value Date   HGBA1C 5.4 07/16/2018   MPG 108.28 07/16/2018   MPG 100 04/09/2012   Lab Results  Component Value Date   PROLACTIN 21.0 (H) 06/21/2016   Lab Results  Component Value Date   CHOL 145 06/14/2020   TRIG 75 06/14/2020   HDL 50 06/14/2020   CHOLHDL 2.9 06/14/2020   VLDL 15 06/14/2020   LDLCALC 80 06/14/2020   LDLCALC 153 (H) 07/16/2018   See Psychiatric Specialty Exam and Suicide Risk Assessment  completed by Attending Physician prior to discharge.  Discharge destination:  Home  Is patient on multiple antipsychotic therapies at discharge:  No   Has Patient had three or more failed trials of antipsychotic monotherapy by history:  No  Recommended Plan for Multiple Antipsychotic Therapies: NA  Allergies as of 06/17/2020   No Known Allergies     Medication List    STOP taking these medications   amLODipine 5 MG tablet Commonly known as: NORVASC     TAKE these medications     Indication  ARIPiprazole 5 MG tablet Commonly known as: ABILIFY Take 1 tablet (5 mg total) by mouth daily. For mood control What changed: additional instructions  Indication: Mood control   buPROPion 150 MG 24 hr tablet Commonly known as: WELLBUTRIN XL Take 1 tablet (150 mg total) by mouth daily. For depression What changed: additional instructions  Indication: Major Depressive Disorder   hydrOXYzine 25 MG tablet Commonly known as: ATARAX/VISTARIL Take 1 tablet (25 mg total) by mouth every 6 (six) hours as needed for anxiety.  Indication: Feeling Anxious   lisinopril 10 MG tablet Commonly known as: ZESTRIL Take 1 tablet (10 mg total) by mouth daily. For high blood pressure Start taking on: June 18, 2020  Indication: High Blood Pressure Disorder   mirtazapine 7.5 MG tablet Commonly known as: REMERON Take 1 tablet (7.5 mg total) by mouth at bedtime. For sleep  Indication: Insomnia   pantoprazole 40 MG tablet Commonly known as: PROTONIX Take 1 tablet (40 mg total) by mouth daily. For acid reflux Start taking on: June 18, 2020  Indication: Gastroesophageal Reflux Disease   prazosin 2 MG capsule Commonly known as: MINIPRESS Take 1 capsule (2 mg total) by mouth at bedtime. For nightmares  Indication: Frightening Dreams   traZODone 50 MG tablet Commonly known as: DESYREL Take 1 tablet (50 mg total) by mouth at bedtime as needed for sleep.  Indication: Trouble Sleeping        Follow-up Information    [facility] Follow up.   Specialty: Addiction Medicine Why: A referral to this provider has been made on your behalf. Contact information: [address] [place] [address] [phone]        [facility] Follow up.   Why: A referral to this provider has been made on your behalf. Contact information: [address]  [place] [address] [phone]        [facility]. Call.   Specialty: Behavioral Health Why: A referral has been made to this provider for medication management and therapy services.  Please call to obtain your appointment information or visit during their walk in hours from 2:00 to 5:00 pm, Monday through Friday.  Contact information: [address] [place] [address] [phone]       [facility] Follow up.   Why: A referral to this provider has been made on your behalf. Contact information: [address] [place] [address] [phone]              Follow-up recommendations: Activity:  As tolerated Diet: As recommended by your primary care doctor. Keep all scheduled follow-up appointments as recommended.    Comments: Prescriptions given at discharge.  Patient agreeable to plan.  Given opportunity to ask questions.  Appears to feel comfortable with discharge denies any current suicidal or homicidal thought. Patient is also instructed prior to discharge to: Take all medications as prescribed by his/her mental healthcare provider. Report any adverse effects and or reactions from the medicines to his/her outpatient provider promptly. Patient has been instructed & cautioned: To not engage in alcohol and or illegal drug use while on prescription medicines. In the event of worsening symptoms, patient is instructed to call the crisis hotline, 911 and or go to the nearest ED for appropriate evaluation  and treatment of symptoms. To follow-up with his/her primary care provider for your other medical issues, concerns and or health care needs.  Signed: Armandina Stammer, NP, PMHNP, FNP-BC 06/17/2020, 11:42 AM

## 2020-06-28 ENCOUNTER — Ambulatory Visit (HOSPITAL_COMMUNITY): Payer: Medicaid Other | Admitting: Psychiatry

## 2020-07-28 ENCOUNTER — Inpatient Hospital Stay (HOSPITAL_COMMUNITY)
Admission: AD | Admit: 2020-07-28 | Discharge: 2020-08-02 | DRG: 885 | Disposition: A | Discharge status: Home / Self Care | Payer: Medicaid Other | Source: Other Acute Inpatient Hospital | Attending: Psychiatry | Admitting: Psychiatry

## 2020-07-28 ENCOUNTER — Encounter (HOSPITAL_COMMUNITY): Payer: Self-pay | Admitting: Emergency Medicine

## 2020-07-28 ENCOUNTER — Emergency Department (HOSPITAL_COMMUNITY)
Admission: EM | Admit: 2020-07-28 | Discharge: 2020-07-28 | Disposition: A | Discharge status: Other Acute Inpatient Hospital | Payer: Medicaid Other | Attending: Emergency Medicine | Admitting: Emergency Medicine

## 2020-07-28 ENCOUNTER — Other Ambulatory Visit: Payer: Self-pay

## 2020-07-28 DIAGNOSIS — F319 Bipolar disorder, unspecified: Secondary | ICD-10-CM | POA: Diagnosis present

## 2020-07-28 DIAGNOSIS — F515 Nightmare disorder: Secondary | ICD-10-CM | POA: Diagnosis not present

## 2020-07-28 DIAGNOSIS — Z590 Homelessness: Secondary | ICD-10-CM | POA: Diagnosis not present

## 2020-07-28 DIAGNOSIS — F1590 Other stimulant use, unspecified, uncomplicated: Secondary | ICD-10-CM | POA: Insufficient documentation

## 2020-07-28 DIAGNOSIS — Z20822 Contact with and (suspected) exposure to covid-19: Secondary | ICD-10-CM | POA: Diagnosis present

## 2020-07-28 DIAGNOSIS — I10 Essential (primary) hypertension: Secondary | ICD-10-CM | POA: Diagnosis present

## 2020-07-28 DIAGNOSIS — R45851 Suicidal ideations: Secondary | ICD-10-CM | POA: Diagnosis not present

## 2020-07-28 DIAGNOSIS — F3130 Bipolar disorder, current episode depressed, mild or moderate severity, unspecified: Secondary | ICD-10-CM | POA: Diagnosis not present

## 2020-07-28 DIAGNOSIS — F1910 Other psychoactive substance abuse, uncomplicated: Secondary | ICD-10-CM | POA: Insufficient documentation

## 2020-07-28 DIAGNOSIS — F17210 Nicotine dependence, cigarettes, uncomplicated: Secondary | ICD-10-CM | POA: Diagnosis not present

## 2020-07-28 DIAGNOSIS — F4310 Post-traumatic stress disorder, unspecified: Secondary | ICD-10-CM | POA: Diagnosis not present

## 2020-07-28 DIAGNOSIS — G4700 Insomnia, unspecified: Secondary | ICD-10-CM | POA: Diagnosis not present

## 2020-07-28 DIAGNOSIS — E876 Hypokalemia: Secondary | ICD-10-CM | POA: Insufficient documentation

## 2020-07-28 DIAGNOSIS — F314 Bipolar disorder, current episode depressed, severe, without psychotic features: Secondary | ICD-10-CM | POA: Diagnosis present

## 2020-07-28 DIAGNOSIS — F1994 Other psychoactive substance use, unspecified with psychoactive substance-induced mood disorder: Secondary | ICD-10-CM | POA: Diagnosis not present

## 2020-07-28 DIAGNOSIS — K219 Gastro-esophageal reflux disease without esophagitis: Secondary | ICD-10-CM | POA: Diagnosis not present

## 2020-07-28 DIAGNOSIS — F1424 Cocaine dependence with cocaine-induced mood disorder: Secondary | ICD-10-CM | POA: Diagnosis present

## 2020-07-28 DIAGNOSIS — Z79899 Other long term (current) drug therapy: Secondary | ICD-10-CM | POA: Insufficient documentation

## 2020-07-28 LAB — BASIC METABOLIC PANEL
Anion gap: 9 (ref 5–15)
BUN: 10 mg/dL (ref 6–20)
CO2: 28 mmol/L (ref 22–32)
Calcium: 8 mg/dL — ABNORMAL LOW (ref 8.9–10.3)
Chloride: 100 mmol/L (ref 98–111)
Creatinine, Ser: 0.92 mg/dL (ref 0.61–1.24)
GFR calc Af Amer: >60 mL/min (ref >60)
GFR calc non Af Amer: >60 mL/min (ref >60)
Glucose, Bld: 106 mg/dL — ABNORMAL HIGH (ref 70–99)
Potassium: 2.7 mmol/L — CL (ref 3.5–5.1)
Sodium: 137 mmol/L (ref 135–145)

## 2020-07-28 LAB — CBC WITH DIFFERENTIAL/PLATELET
Abs Immature Granulocytes: 0.03 10*3/uL (ref 0.00–0.07)
Basophils Absolute: 0 10*3/uL (ref 0.0–0.1)
Basophils Relative: 0 %
Eosinophils Absolute: 0.1 10*3/uL (ref 0.0–0.5)
Eosinophils Relative: 1 %
HCT: 39.3 % (ref 39.0–52.0)
Hemoglobin: 13.1 g/dL (ref 13.0–17.0)
Immature Granulocytes: 0 %
Lymphocytes Relative: 34 %
Lymphs Abs: 2.3 10*3/uL (ref 0.7–4.0)
MCH: 29.7 pg (ref 26.0–34.0)
MCHC: 33.3 g/dL (ref 30.0–36.0)
MCV: 89.1 fL (ref 80.0–100.0)
Monocytes Absolute: 0.5 10*3/uL (ref 0.1–1.0)
Monocytes Relative: 8 %
Neutro Abs: 4 10*3/uL (ref 1.7–7.7)
Neutrophils Relative %: 57 %
Platelets: 227 10*3/uL (ref 150–400)
RBC: 4.41 MIL/uL (ref 4.22–5.81)
RDW: 13.9 % (ref 11.5–15.5)
WBC: 7 10*3/uL (ref 4.0–10.5)
nRBC: 0 % (ref 0.0–0.2)

## 2020-07-28 LAB — RAPID URINE DRUG SCREEN, HOSP PERFORMED
Amphetamines: NOT DETECTED
Barbiturates: NOT DETECTED
Benzodiazepines: NOT DETECTED
Cocaine: POSITIVE — AB
Opiates: NOT DETECTED
Tetrahydrocannabinol: POSITIVE — AB

## 2020-07-28 LAB — SARS CORONAVIRUS 2 BY RT PCR (HOSPITAL ORDER, PERFORMED IN ~~LOC~~ HOSPITAL LAB): SARS Coronavirus 2: NEGATIVE

## 2020-07-28 LAB — T4, FREE: Free T4: 0.98 ng/dL (ref 0.61–1.12)

## 2020-07-28 LAB — TSH: TSH: 0.225 u[IU]/mL — ABNORMAL LOW (ref 0.350–4.500)

## 2020-07-28 LAB — ETHANOL: Alcohol, Ethyl (B): <10 mg/dL (ref <10)

## 2020-07-28 MED ORDER — ACETAMINOPHEN 325 MG PO TABS
650.0000 mg | ORAL_TABLET | Freq: Four times a day (QID) | ORAL | Status: DC | PRN
  Administered 2020-07-29 – 2020-08-01 (×3): 650 mg via ORAL
  Filled 2020-07-28 (×3): qty 2

## 2020-07-28 MED ORDER — PANTOPRAZOLE SODIUM 40 MG PO TBEC
40.0000 mg | DELAYED_RELEASE_TABLET | Freq: Every day | ORAL | Status: DC
  Administered 2020-07-29 – 2020-08-02 (×5): 40 mg via ORAL
  Filled 2020-07-28 (×5): qty 1
  Filled 2020-07-28: qty 14
  Filled 2020-07-28 (×2): qty 1

## 2020-07-28 MED ORDER — MIRTAZAPINE 7.5 MG PO TABS
7.5000 mg | ORAL_TABLET | Freq: Every day | ORAL | Status: DC
  Administered 2020-07-29 – 2020-08-01 (×4): 7.5 mg via ORAL
  Filled 2020-07-28 (×6): qty 1
  Filled 2020-07-28: qty 14

## 2020-07-28 MED ORDER — POTASSIUM CHLORIDE CRYS ER 20 MEQ PO TBCR
80.0000 meq | EXTENDED_RELEASE_TABLET | Freq: Once | ORAL | Status: AC
  Administered 2020-07-28: 80 meq via ORAL
  Filled 2020-07-28: qty 4

## 2020-07-28 MED ORDER — ALUM & MAG HYDROXIDE-SIMETH 200-200-20 MG/5ML PO SUSP: 30.0000 mL | ORAL | Status: DC | PRN

## 2020-07-28 MED ORDER — MAGNESIUM HYDROXIDE 400 MG/5ML PO SUSP: 30.0000 mL | Freq: Every day | ORAL | Status: DC | PRN

## 2020-07-28 MED ORDER — PRAZOSIN HCL 2 MG PO CAPS
2.0000 mg | ORAL_CAPSULE | Freq: Every day | ORAL | Status: DC
  Administered 2020-07-29 – 2020-08-01 (×4): 2 mg via ORAL
  Filled 2020-07-28 (×4): qty 1
  Filled 2020-07-28: qty 2
  Filled 2020-07-28 (×2): qty 1
  Filled 2020-07-28: qty 14

## 2020-07-28 MED ORDER — BUPROPION HCL ER (XL) 150 MG PO TB24
150.0000 mg | ORAL_TABLET | Freq: Every day | ORAL | Status: DC
  Administered 2020-07-29 – 2020-08-02 (×5): 150 mg via ORAL
  Filled 2020-07-28 (×6): qty 1
  Filled 2020-07-28: qty 14
  Filled 2020-07-28: qty 1

## 2020-07-28 MED ORDER — TRAZODONE HCL 50 MG PO TABS
50.0000 mg | ORAL_TABLET | Freq: Every evening | ORAL | Status: DC | PRN
  Administered 2020-08-01: 50 mg via ORAL
  Filled 2020-07-28 (×2): qty 14
  Filled 2020-07-28: qty 1

## 2020-07-28 MED ORDER — HYDROXYZINE HCL 25 MG PO TABS
25.0000 mg | ORAL_TABLET | Freq: Three times a day (TID) | ORAL | Status: DC | PRN
  Filled 2020-07-28: qty 10

## 2020-07-28 MED ORDER — LISINOPRIL 10 MG PO TABS
10.0000 mg | ORAL_TABLET | Freq: Every day | ORAL | Status: DC
  Administered 2020-07-29 – 2020-08-02 (×5): 10 mg via ORAL
  Filled 2020-07-28: qty 2
  Filled 2020-07-28 (×5): qty 1
  Filled 2020-07-28: qty 2
  Filled 2020-07-28: qty 1
  Filled 2020-07-28: qty 14

## 2020-07-28 MED ORDER — ARIPIPRAZOLE 5 MG PO TABS
5.0000 mg | ORAL_TABLET | Freq: Every day | ORAL | Status: DC
  Administered 2020-07-29 – 2020-08-02 (×5): 5 mg via ORAL
  Filled 2020-07-28 (×6): qty 1
  Filled 2020-07-28: qty 14
  Filled 2020-07-28: qty 1

## 2020-07-28 NOTE — ED Triage Notes (Signed)
Patient presents with suicidal ideations. He plans to kill himself by overdosing on heroin. He says he almost days ago from heroin and had thoughts to use it again to commit suicide. He has not been taking his medication.

## 2020-07-28 NOTE — ED Notes (Signed)
Transportation arranged

## 2020-07-28 NOTE — ED Notes (Signed)
Pt came in with 2 bags (1 pt belonging bag and 1 red book bag) located in locker 30-tcu

## 2020-07-28 NOTE — ED Notes (Signed)
Date and time results received: 07/28/20 4:37 PM  (use smartphrase ".now" to insert current time)  Test: K+ Critical Value: 2.7  Name of Provider Notified: Dr. Anitra Lauth  Orders Received? Or Actions Taken?:

## 2020-07-28 NOTE — BH Assessment (Signed)
Per Hassie Bruce, RN pt has been accepted to [facility]  302-02, attending provider Dr. Jama Flavors. Pt is able to come over at this time. This counselor notified Gwyneth Sprout, MD of this update.   Dolores Frame, MSW, LCSW-A Triage Specialist [phone]
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
## 2020-07-28 NOTE — ED Provider Notes (Addendum)
[facility]   CSN: [id number] Arrival date & time: 07/28/20  1425     History No chief complaint on file.   Chase Moss is a 54 y.o. male.  54 y/o male with hx of homelessness, chronic mental illness, polysubstance use disorder & multiple psychiatric admissions presenting today with c/o of depression, feelings of hopelessness and SI.  Pt states he has started using heroin, meth, cocaine and marijuana again.  He was supposed to move to [place] and that fell through last month and he has been on the streets since. Pt denies any excessive alcohol use.  He states that he is going to overdose on heroin on purpose to die because he feels like that would be a painless way to die.  He has not been on his meds since leaving the hospital.  Patient states he should have stayed in the hospital and gone to rehab but he thought he was going to be moving to [place].  Patient states that he has not been using heroin regularly to where he is withdrawing after he detoxed 1 month ago.  He reports that he last used heroin 2 days ago and used meth yesterday.  He denies excessive alcohol use in the last few weeks.        Past Medical History:  Diagnosis Date  . Bipolar 1 disorder (HCC)   . Depression   . Hypertension   . PTSD (post-traumatic stress disorder)     Patient Active Problem List   Diagnosis Date Noted  . MDD (major depressive disorder), recurrent severe, without psychosis (HCC) 06/15/2020  . Opioid dependence with opioid-induced mood disorder (HCC) 06/14/2020  . MDD (major depressive disorder), recurrent episode, severe (HCC) 09/10/2018  . Moderate bipolar I disorder, most recent episode depressed (HCC) 07/07/2018  . Homicidal ideation   . Alcohol abuse with alcohol-induced mood disorder (HCC) 03/29/2018  . Cannabis use disorder, mild, abuse 02/12/2018  . Bipolar I disorder (HCC) 02/11/2018  . MDD (major depressive disorder) 12/11/2017  .  Intentional overdose of drug in tablet form (HCC) 11/08/2017  . Dyslipidemia 11/08/2017  . Constipation 11/08/2017  . Drug overdose 11/05/2017  . Acute alcoholic intoxication without complication (HCC)   . Suicidal ideation   . Alcohol intoxication (HCC) 10/22/2017  . Alcohol withdrawal (HCC) 10/22/2017  . Polysubstance abuse (HCC)   . Major depressive disorder, recurrent, severe with psychotic features (HCC) 05/15/2017  . Cocaine abuse with cocaine-induced mood disorder (HCC) 04/01/2017  . HTN (hypertension) 06/20/2016  . Tobacco use disorder 06/20/2016  . Trauma 07/27/2015  . Cannabis use disorder, moderate, dependence (HCC)   . PTSD (post-traumatic stress disorder)   . Substance induced mood disorder (HCC) 07/07/2014  . Alcohol use disorder, moderate, dependence (HCC) 04/10/2012  . Suicidal thoughts 04/04/2012    No past surgical history on file.     Family History  Problem Relation Age of Onset  . Heart attack Other     Social History   Tobacco Use  . Smoking status: Current Every Day Smoker    Packs/day: 0.25    Years: 30.00    Pack years: 7.50    Types: Cigarettes  . Smokeless tobacco: Never Used  Vaping Use  . Vaping Use: Never used  Substance Use Topics  . Alcohol use: Not Currently  . Drug use: Yes    Types: Marijuana, Methamphetamines    Comment: heroin    Home Medications Prior to Admission medications   Medication Sig Start  Date End Date Taking? Authorizing Provider  ARIPiprazole (ABILIFY) 5 MG tablet Take 1 tablet (5 mg total) by mouth daily. For mood control 06/17/20   Armandina Stammer I, NP  buPROPion (WELLBUTRIN XL) 150 MG 24 hr tablet Take 1 tablet (150 mg total) by mouth daily. For depression 06/17/20   Armandina Stammer I, NP  hydrOXYzine (ATARAX/VISTARIL) 25 MG tablet Take 1 tablet (25 mg total) by mouth every 6 (six) hours as needed for anxiety. 06/17/20   Armandina Stammer I, NP  lisinopril (ZESTRIL) 10 MG tablet Take 1 tablet (10 mg total) by mouth daily.  For high blood pressure 06/18/20   Nwoko, Nicole Kindred I, NP  mirtazapine (REMERON) 7.5 MG tablet Take 1 tablet (7.5 mg total) by mouth at bedtime. For sleep 06/17/20   Armandina Stammer I, NP  pantoprazole (PROTONIX) 40 MG tablet Take 1 tablet (40 mg total) by mouth daily. For acid reflux 06/18/20   Armandina Stammer I, NP  prazosin (MINIPRESS) 2 MG capsule Take 1 capsule (2 mg total) by mouth at bedtime. For nightmares 06/17/20   Armandina Stammer I, NP  traZODone (DESYREL) 50 MG tablet Take 1 tablet (50 mg total) by mouth at bedtime as needed for sleep. 06/17/20   Sanjuana Kava, NP    Allergies    Patient has no known allergies.  Review of Systems   Review of Systems  All other systems reviewed and are negative.   Physical Exam Updated Vital Signs BP (!) 137/94 (BP Location: Right Arm)   Pulse 79   Temp 97.7 F (36.5 C) (Oral)   Resp 16   Ht 5\' 10"  (1.778 m)   Wt 68 kg   SpO2 100%   BMI 21.52 kg/m   Physical Exam Vitals and nursing note reviewed.  Constitutional:      General: He is not in acute distress.    Appearance: Normal appearance. He is well-developed and normal weight.  HENT:     Head: Normocephalic and atraumatic.  Eyes:     Conjunctiva/sclera: Conjunctivae normal.     Pupils: Pupils are equal, round, and reactive to light.  Cardiovascular:     Rate and Rhythm: Normal rate and regular rhythm.     Heart sounds: No murmur heard.   Pulmonary:     Effort: Pulmonary effort is normal. No respiratory distress.     Breath sounds: Normal breath sounds. No wheezing or rales.  Abdominal:     General: There is no distension.     Palpations: Abdomen is soft.     Tenderness: There is no abdominal tenderness. There is no guarding or rebound.  Musculoskeletal:        General: No tenderness. Normal range of motion.     Cervical back: Normal range of motion and neck supple.     Comments: Track marks in the antecubital fossa without signs of infection  Skin:    General: Skin is warm and dry.      Findings: No erythema or rash.  Neurological:     General: No focal deficit present.     Mental Status: He is alert and oriented to person, place, and time. Mental status is at baseline.  Psychiatric:        Attention and Perception: Attention normal. He does not perceive auditory hallucinations.        Mood and Affect: Mood is depressed. Affect is flat.        Behavior: Behavior is withdrawn.  Thought Content: Thought content includes suicidal ideation. Thought content includes suicidal plan.      ED Results / Procedures / Treatments   Labs (all labs ordered are listed, but only abnormal results are displayed) Labs Reviewed  BASIC METABOLIC PANEL - Abnormal; Notable for the following components:      Result Value   Potassium 2.7 (*)    Glucose, Bld 106 (*)    Calcium 8.0 (*)    All other components within normal limits  RAPID URINE DRUG SCREEN, HOSP PERFORMED - Abnormal; Notable for the following components:   Cocaine POSITIVE (*)    Tetrahydrocannabinol POSITIVE (*)    All other components within normal limits  TSH - Abnormal; Notable for the following components:   TSH 0.225 (*)    All other components within normal limits  SARS CORONAVIRUS 2 BY RT PCR (HOSPITAL ORDER, PERFORMED IN [facility])  CBC WITH DIFFERENTIAL/PLATELET  ETHANOL  T4, FREE    EKG None  Radiology No results found.  Procedures Procedures (including critical care time)  Medications Ordered in ED Medications - No data to display  ED Course  I have reviewed the triage vital signs and the nursing notes.  Pertinent labs & imaging results that were available during my care of the patient were reviewed by me and considered in my medical decision making (see chart for details).    MDM Rules/Calculators/A&P                          Patient presenting with worsening depression symptoms and suicidal ideation in the setting of chronic substance abuse.  Patient denies any  withdrawal symptoms at this time and medically has reassuring vital signs and no significant findings on exam.  Patient had labs done approximately 6 weeks ago and at that time did have a decreased TSH level which will recheck.  Otherwise feel that he is medically clear.  Will discuss with behavioral health urgent care as he seems that he may be a good candidate.  He is currently voluntary but is suicidal and has a plan.  If he insist on leaving will need IVC.  4:42 PM CBC, Etoh wnl. Hypokalemic today most likely related poor nutrition recently.  Replaced orally and given something to eat.  TSH remains low at .225 and will get a free T4 but pt is not hypertensive or tachycardic today concern for thyroid storm.  7:15 PM Free T4 is normal.  Patient is now medically clear.  Attempted to contact behavioral health urgent care and nobody answered the phone.  We will have TTS evaluate.   MDM Number of Diagnoses or Management Options   Amount and/or Complexity of Data Reviewed Clinical lab tests: ordered and reviewed Decide to obtain previous medical records or to obtain history from someone other than the patient: yes Obtain history from someone other than the patient: no Review and summarize past medical records: yes Discuss the patient with other providers: yes Independent visualization of images, tracings, or specimens: yes  Risk of Complications, Morbidity, and/or Mortality Presenting problems: moderate Diagnostic procedures: low Management options: low  Patient Progress Patient progress: stable  Final Clinical Impression(s) / ED Diagnoses Final diagnoses:  Suicidal ideation  Polysubstance abuse (HCC)  Hypokalemia    Rx / DC Orders ED Discharge Orders    None       Gwyneth Sprout, MD 07/28/20 Prentice Docker    Gwyneth Sprout, MD 07/28/20 2224

## 2020-07-29 ENCOUNTER — Encounter (HOSPITAL_COMMUNITY): Payer: Self-pay | Admitting: Nurse Practitioner

## 2020-07-29 DIAGNOSIS — F1994 Other psychoactive substance use, unspecified with psychoactive substance-induced mood disorder: Secondary | ICD-10-CM

## 2020-07-29 DIAGNOSIS — F1424 Cocaine dependence with cocaine-induced mood disorder: Secondary | ICD-10-CM

## 2020-07-29 MED ORDER — ADULT MULTIVITAMIN W/MINERALS CH
1.0000 | ORAL_TABLET | Freq: Every day | ORAL | Status: DC
  Administered 2020-07-29 – 2020-08-02 (×5): 1 via ORAL
  Filled 2020-07-29 (×7): qty 1

## 2020-07-29 MED ORDER — ENSURE ENLIVE PO LIQD
237.0000 mL | Freq: Two times a day (BID) | ORAL | Status: DC
  Administered 2020-07-29 – 2020-08-01 (×6): 237 mL via ORAL

## 2020-07-29 MED ORDER — BACITRACIN ZINC 500 UNIT/GM EX OINT
TOPICAL_OINTMENT | Freq: Two times a day (BID) | CUTANEOUS | Status: DC
  Administered 2020-07-30 – 2020-08-01 (×4): 31.1111 via TOPICAL
  Filled 2020-07-29 (×2): qty 28.35

## 2020-07-29 NOTE — Tx Team (Signed)
Initial Treatment Plan 07/29/2020 12:30 AM Chase Moss [id number]    PATIENT STRESSORS: Homelessness  Financial problems   PATIENT STRENGTHS: Communication skills   PATIENT IDENTIFIED PROBLEMS: Anxiety  Depression  Suicidal ideation      "Get back on my meds"  "Go to drug rehab"         DISCHARGE CRITERIA:  Adequate post-discharge living arrangements Motivation to continue treatment Verbal commitment to aftercare  PRELIMINARY DISCHARGE PLAN: Attend aftercare/continuing care group Attend 12-step recovery group Outpatient therapy Placement in alternative living arrangements  PATIENT/FAMILY INVOLVEMENT: This treatment plan has been presented to and reviewed with the patient, Chase Moss.  The patient  have been given the opportunity to ask questions and make suggestions.  Margarita Rana, RN 07/29/2020, 12:30 AM
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
## 2020-07-29 NOTE — Progress Notes (Signed)
  Admission DAR NOTE:  Pt Transferred from [facility] on  Voluntary Status. Alert and oriented by 3. Pt present with, logical speech and fair eye contact.  Reports worsening depression and Passive SI. Verbally contracted for safety. Patients report being Sad and depressed due to financial problem and drug addiction. Patient stated that he felt like ending his life with overdose on Heroine. He said a few days ago he almost died from overdose but did not so he finally decided to come to the hospital to get help. He mentioned that he would like to go to long term treatment since he has been "binge on Cocaine, Heroine and Methy" and has lost 48 lb in the last 3 months. Chase Moss stressors include being homeless and financial problems. His goal is to be compliant with his medication and "get off drugs"    Emotional support and availability offered to Patient as needed. Skin assessment done and belongings searched per protocol.  Items deemed contraband secured in locker. Unit orientation and routine discussed, Care Plan reviewed as well and Patient verbalized understanding. Fluids and lunch offered, tolerated well. Q15 minutes safety checks initiated without self harm gestures.
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
## 2020-07-29 NOTE — [facility] Suicide Risk Assessment (Signed)
[facility] INPATIENT:  Family/Significant Other Suicide Prevention Education  Suicide Prevention Education:  Patient Refusal for Family/Significant Other Suicide Prevention Education: The patient Chase Moss has refused to provide written consent for family/significant other to be provided Family/Significant Other Suicide Prevention Education during admission and/or prior to discharge.  Physician notified.  Deziah Renwick T Wilbon Obenchain 07/29/2020, 11:50 AM

## 2020-07-29 NOTE — Progress Notes (Signed)
NUTRITION ASSESSMENT RD working remotely.   Pt identified as at risk on the Malnutrition Screen Tool  INTERVENTION: - will order Ensure Enlive BID, each supplement provides 350 kcal and 20 grams of protein. - will order 1 tablet multivitamin with minerals/day.  NUTRITION DIAGNOSIS: Unintentional weight loss related to sub-optimal intake as evidenced by pt report.   Goal: Pt to meet >/= 90% of their estimated nutrition needs.  Monitor:  PO intake  Assessment:  Patient admitted for depression and passive SI due to financial problems and drug addiction. Patient is homeless.   Patient reported to [facility] at [facility] that he has lost 48 lb in the past 3 months. Weight yesterday was recorded as both 145 lb and 150 lb. Weight on 06/15/20 was 131 lb. Weight on 12/30/19 was 175 lb. Prior to that, no weight hx since 2019.  54 y.o. male  Height: Ht Readings from Last 1 Encounters:  07/28/20 5\' 10"  (1.778 m)    Weight: Wt Readings from Last 1 Encounters:  07/28/20 65.8 kg    Weight Hx: Wt Readings from Last 10 Encounters:  07/28/20 65.8 kg  07/28/20 68 kg  06/15/20 59.4 kg  12/30/19 79.4 kg  10/06/18 78.5 kg  09/10/18 78.9 kg  09/09/18 78.5 kg  07/07/18 78.9 kg  07/06/18 79.4 kg  03/29/18 83.9 kg    BMI:  Body mass index is 20.81 kg/m. Pt meets criteria for normal weight based on current BMI.  Estimated Nutritional Needs: Kcal: 25-30 kcal/kg Protein: > 1 gram protein/kg Fluid: 1 ml/kcal  Diet Order:  Diet Order            Diet regular Room service appropriate? Yes; Fluid consistency: Thin  Diet effective now                Pt is also offered choice of unit snacks mid-morning and mid-afternoon.  Pt is eating as desired.   Lab results and medications reviewed.       05/29/18, MS, RD, LDN, CNSC Inpatient Clinical Dietitian RD pager # available in [facility]  After hours/weekend pager # available in [facility]

## 2020-07-29 NOTE — Progress Notes (Signed)
[place] NOVEL CORONAVIRUS (COVID-19) DAILY CHECK-OFF SYMPTOMS - answer yes or no to each - every day NO YES  Have you had a fever in the past 24 hours?  . Fever (Temp > 37.80C / 100F) X   Have you had any of these symptoms in the past 24 hours? . New Cough .  Sore Throat  .  Shortness of Breath .  Difficulty Breathing .  Unexplained Body Aches   X   Have you had any one of these symptoms in the past 24 hours not related to allergies?   . Runny Nose .  Nasal Congestion .  Sneezing   X   If you have had runny nose, nasal congestion, sneezing in the past 24 hours, has it worsened?  X   EXPOSURES - check yes or no X   Have you traveled outside the state in the past 14 days?  X   Have you been in contact with someone with a confirmed diagnosis of COVID-19 or PUI in the past 14 days without wearing appropriate PPE?  X   Have you been living in the same home as a person with confirmed diagnosis of COVID-19 or a PUI (household contact)?    X   Have you been diagnosed with COVID-19?    X              What to do next: Answered NO to all: Answered YES to anything:   Proceed with unit schedule Follow the [facility] Inpatient Flowsheet.   

## 2020-07-29 NOTE — Progress Notes (Signed)
Recreation Therapy Notes  Date: 8.6.21 Time: 0930 Location: 300 [address] Dayroom  Group Topic: Stress Management  Goal Area(s) Addresses:  Patient will identify positive stress management techniques. Patient will identify benefits of using stress management post d/c.  Intervention: Stress Management  Activity: Guided Imagery.  LRT read a script that guided patients to envision their peaceful place.  Patients were to listen and follow along as script was read to engage in activity.    Education:  Stress Management, Discharge Planning.   Education Outcome: Acknowledges Education  Clinical Observations/Feedback: Pt did not attend group activity.    Caroll Rancher, LRT/CTRS         Caroll Rancher A 07/29/2020 12:03 PM

## 2020-07-30 NOTE — Progress Notes (Signed)
   07/30/20 0024  COVID-19 Daily Checkoff  Have you had a fever (temp > 37.80C/100F)  in the past 24 hours?  No  COVID-19 EXPOSURE  Have you traveled outside the state in the past 14 days? No  Have you been in contact with someone with a confirmed diagnosis of COVID-19 or PUI in the past 14 days without wearing appropriate PPE? No  Have you been living in the same home as a person with confirmed diagnosis of COVID-19 or a PUI (household contact)? No  Have you been diagnosed with COVID-19? No

## 2020-07-30 NOTE — Progress Notes (Signed)
°   07/29/20 2245  Psych Admission Type (Psych Patients Only)  Admission Status Voluntary  Psychosocial Assessment  Patient Complaints Irritability;Insomnia  Eye Contact Brief  Facial Expression Anxious;Pained  Affect Anxious;Irritable  Speech Soft;Logical/coherent  Interaction Demanding;Assertive;Isolative;Needy  Motor Activity Unsteady;Slow  Appearance/Hygiene Disheveled  Behavior Characteristics Anxious;Irritable;Calm  Mood Depressed;Irritable  Thought Process  Coherency WDL  Content WDL  Delusions None reported or observed  Perception WDL  Hallucination None reported or observed  Judgment Impaired  Confusion None  Danger to Self  Current suicidal ideation? Passive  Self-Injurious Behavior No self-injurious ideation or behavior indicators observed or expressed   Agreement Not to Harm Self Yes  Description of Agreement verbally contracts for safety  Danger to Others  Danger to Others None reported or observed

## 2020-07-30 NOTE — Progress Notes (Signed)
Patient presents with a flat affect and is noted to be irritable and rude on approach. He denies SI/HI. Pt reports ongoing depression but would not provided further details. He c/o of having right sided hip pain and stated that he could not walk. He denies recent injury to his hip. He refused to get up this morning and take his medications. However, he was seen walking to and from the cafeteria for lunch and at the nurses station requesting coffee.   Medications reviewed. Writer encouraged the patient twice this morning to take his medications, medications documented as refusal. Vital signs reviewed. Verbal support provided. 15 minute checks performed for safety.  Pt non-compliant with tx plan.

## 2020-07-30 NOTE — Progress Notes (Signed)
[facility] MD Progress Note  07/30/2020 12:42 PM Chase Moss  MRN:  [id number] Subjective:  Patient reports he feels " sore all over".  He does acknowledge he is feeling "a little better".  Today denies suicidal ideations and presents future oriented, focusing more on disposition planning and expressing interest in going to rehab at discharge. Denies medication side effects thus far. Objective: I discussed case with treatment team and met with patient. 54 year old male, currently homeless, presented on 8/5 reporting worsening depression, feeling hopeless, experiencing suicidal ideations with thoughts of overdosing on heroin, experiencing neurovegetative symptoms of depression.  Reports history of polysubstance use disorder, identifies cocaine and cannabis as substance of choice.  He also endorses intermittent but not regular heroin use.  Last used 5 days ago.  He denies any recent alcohol use.  Admission UDS positive for cocaine and cannabis, BAL negative. He is known to our unit from prior admissions for depression, suicidal ideations and substance use disorder.  He was not taking any psychiatric medications prior to admission.  Identifies homelessness as a major stressor  Today patient presents alert, attentive, without psychomotor agitation/presents calm and in no acute distress. Currently denies suicidal ideations and presents more future oriented, inquiring about disposition planning and expressing interest in going to a rehab.  States "I am ready to go whenever they can find a bed ". No disruptive or agitated behaviors on unit, limited milieu participation, spending most time in his room, not for meals. He reports feeling "sore" and describes some hip pain stemming from a fall that occurred about 2 weeks ago. He is not currently presenting with symptoms of opiate withdrawal.  No rhinorrhea, no lacrimation, no piloerection, no nausea or vomiting, no cramping.  As noted he states he has not used heroin  for about 5 days prior to his admission. Patient states he removed a tick from his thigh earlier today.  No rash or target lesion.  No fever.  He states he has removed several ticks over recent days, in the context of homelessness and staying outside.  I reviewed with infectious disease consultant, recommendation is that prophylactic antibiotic is not currently indicated.  If patient develops rash or fever then a course of doxycycline would be indicated but not otherwise. Labs reviewed-TSH was low at 0.225, T4 normal at 0.98.    Principal Problem: Substance use disorder, substance-induced mood disorder versus MDD Diagnosis: Active Problems:   Bipolar 1 disorder, depressed ([place])  Total Time spent with patient: 20 minutes  Past Psychiatric History:   Past Medical History:  Past Medical History:  Diagnosis Date  . Bipolar 1 disorder ([place])   . Depression   . Hypertension   . PTSD (post-traumatic stress disorder)    History reviewed. No pertinent surgical history. Family History:  Family History  Problem Relation Age of Onset  . Heart attack Other    Family Psychiatric  History:  Social History:  Social History   Substance and Sexual Activity  Alcohol Use Not Currently     Social History   Substance and Sexual Activity  Drug Use Yes  . Types: Marijuana, Methamphetamines   Comment: heroin    Social History   Socioeconomic History  . Marital status: Divorced    Spouse name: Not on file  . Number of children: Not on file  . Years of education: Not on file  . Highest education level: Not on file  Occupational History  . Not on file  Tobacco Use  . Smoking status: Current  Every Day Smoker    Packs/day: 0.25    Years: 30.00    Pack years: 7.50    Types: Cigarettes  . Smokeless tobacco: Never Used  Vaping Use  . Vaping Use: Never used  Substance and Sexual Activity  . Alcohol use: Not Currently  . Drug use: Yes    Types: Marijuana, Methamphetamines    Comment:  heroin  . Sexual activity: Yes  Other Topics Concern  . Not on file  Social History Narrative  . Not on file   Social Determinants of Health   Financial Resource Strain:   . Difficulty of Paying Living Expenses:   Food Insecurity:   . Worried About Charity fundraiser in the Last Year:   . Arboriculturist in the Last Year:   Transportation Needs:   . Film/video editor (Medical):   Marland Kitchen Lack of Transportation (Non-Medical):   Physical Activity:   . Days of Exercise per Week:   . Minutes of Exercise per Session:   Stress:   . Feeling of Stress :   Social Connections:   . Frequency of Communication with Friends and Family:   . Frequency of Social Gatherings with Friends and Family:   . Attends Religious Services:   . Active Member of Clubs or Organizations:   . Attends Archivist Meetings:   Marland Kitchen Marital Status:    Additional Social History:     Sleep: Improving  Appetite:  Improving  Current Medications: Current Facility-Administered Medications  Medication Dose Route Frequency Provider Last Rate Last Admin  . acetaminophen (TYLENOL) tablet 650 mg  650 mg Oral Q6H PRN Lindon Romp A, NP   650 mg at 07/29/20 2245  . alum & mag hydroxide-simeth (MAALOX/MYLANTA) 200-200-20 MG/5ML suspension 30 mL  30 mL Oral Q4H PRN Lindon Romp A, NP      . ARIPiprazole (ABILIFY) tablet 5 mg  5 mg Oral Daily Lindon Romp A, NP   5 mg at 07/29/20 1050  . bacitracin ointment   Topical BID Viviann Broyles A, MD      . buPROPion (WELLBUTRIN XL) 24 hr tablet 150 mg  150 mg Oral Daily Lindon Romp A, NP   150 mg at 07/29/20 1050  . feeding supplement (ENSURE ENLIVE) (ENSURE ENLIVE) liquid 237 mL  237 mL Oral BID BM [place] Ndiaye A, MD   237 mL at 07/29/20 2246  . hydrOXYzine (ATARAX/VISTARIL) tablet 25 mg  25 mg Oral TID PRN Lindon Romp A, NP      . lisinopril (ZESTRIL) tablet 10 mg  10 mg Oral Daily Lindon Romp A, NP   10 mg at 07/29/20 1050  . magnesium hydroxide (MILK OF  MAGNESIA) suspension 30 mL  30 mL Oral Daily PRN Lindon Romp A, NP      . mirtazapine (REMERON) tablet 7.5 mg  7.5 mg Oral QHS Lindon Romp A, NP   7.5 mg at 07/29/20 2245  . multivitamin with minerals tablet 1 tablet  1 tablet Oral Daily Elona Yinger, Myer Peer, MD   1 tablet at 07/29/20 1416  . pantoprazole (PROTONIX) EC tablet 40 mg  40 mg Oral Daily Lindon Romp A, NP   40 mg at 07/29/20 1050  . prazosin (MINIPRESS) capsule 2 mg  2 mg Oral QHS Lindon Romp A, NP   2 mg at 07/29/20 2245  . traZODone (DESYREL) tablet 50 mg  50 mg Oral QHS PRN Rozetta Nunnery, NP  Lab Results:  Results for orders placed or performed during the hospital encounter of 07/28/20 (from the past 48 hour(s))  CBC with Differential/Platelet     Status: None   Collection Time: 07/28/20  3:48 PM  Result Value Ref Range   WBC 7.0 4.0 - 10.5 K/uL   RBC 4.41 4.22 - 5.81 MIL/uL   Hemoglobin 13.1 13.0 - 17.0 g/dL   HCT 39.3 39 - 52 %   MCV 89.1 80.0 - 100.0 fL   MCH 29.7 26.0 - 34.0 pg   MCHC 33.3 30.0 - 36.0 g/dL   RDW 13.9 11.5 - 15.5 %   Platelets 227 150 - 400 K/uL   nRBC 0.0 0.0 - 0.2 %   Neutrophils Relative % 57 %   Neutro Abs 4.0 1.7 - 7.7 K/uL   Lymphocytes Relative 34 %   Lymphs Abs 2.3 0.7 - 4.0 K/uL   Monocytes Relative 8 %   Monocytes Absolute 0.5 0 - 1 K/uL   Eosinophils Relative 1 %   Eosinophils Absolute 0.1 0 - 0 K/uL   Basophils Relative 0 %   Basophils Absolute 0.0 0 - 0 K/uL   Immature Granulocytes 0 %   Abs Immature Granulocytes 0.03 0.00 - 0.07 K/uL    Comment: Performed at [facility], [place] [address], [place], [place] [address]  Basic metabolic panel     Status: Abnormal   Collection Time: 07/28/20  3:48 PM  Result Value Ref Range   Sodium 137 135 - 145 mmol/L   Potassium 2.7 (LL) 3.5 - 5.1 mmol/L    Comment: CRITICAL RESULT CALLED TO, READ BACK BY AND VERIFIED WITH: T.SMITH AT 1635 ON 07/28/20 BY N.THOMPSON    Chloride 100 98 - 111 mmol/L   CO2 28 22 - 32 mmol/L    Glucose, Bld 106 (H) 70 - 99 mg/dL    Comment: Glucose reference range applies only to samples taken after fasting for at least 8 hours.   BUN 10 6 - 20 mg/dL   Creatinine, Ser 0.92 0.61 - 1.24 mg/dL   Calcium 8.0 (L) 8.9 - 10.3 mg/dL   GFR calc non Af Amer >60 >60 mL/min   GFR calc Af Amer >60 >60 mL/min   Anion gap 9 5 - 15    Comment: Performed at [facility], [place] [address], [place], [place] [address]  Ethanol     Status: None   Collection Time: 07/28/20  3:48 PM  Result Value Ref Range   Alcohol, Ethyl (B) <10 <10 mg/dL    Comment: (NOTE) Lowest detectable limit for serum alcohol is 10 mg/dL.  For medical purposes only. Performed at [facility], [place] [address], [place], [place] [address]   SARS Coronavirus 2 by RT PCR (hospital order, performed in [facility] hospital lab) Nasopharyngeal Nasopharyngeal Swab     Status: None   Collection Time: 07/28/20  3:48 PM   Specimen: Nasopharyngeal Swab  Result Value Ref Range   SARS Coronavirus 2 NEGATIVE NEGATIVE    Comment: (NOTE) SARS-CoV-2 target nucleic acids are NOT DETECTED.  The SARS-CoV-2 RNA is generally detectable in upper and lower respiratory specimens during the acute phase of infection. The lowest concentration of SARS-CoV-2 viral copies this assay can detect is 250 copies / mL. A negative result does not preclude SARS-CoV-2 infection and should not be used as the sole basis for treatment or other patient management decisions.  A negative result may occur with improper  specimen collection / handling, submission of specimen other than nasopharyngeal swab, presence of viral mutation(s) within the areas targeted by this assay, and inadequate number of viral copies (<250 copies / mL). A negative result must be combined with clinical observations, patient history, and epidemiological information.  Fact Sheet for Patients:   StrictlyIdeas.no  Fact Sheet  for Healthcare Providers: BankingDealers.co.za  This test is not yet approved or  cleared by the [place] FDA and has been authorized for detection and/or diagnosis of SARS-CoV-2 by FDA under an Emergency Use Authorization (EUA).  This EUA will remain in effect (meaning this test can be used) for the duration of the COVID-19 declaration under Section 564(b)(1) of the Act, 21 U.S.C. section 360bbb-3(b)(1), unless the authorization is terminated or revoked sooner.  Performed at [facility], [place] [address], [place], [place] [address]   TSH     Status: Abnormal   Collection Time: 07/28/20  3:48 PM  Result Value Ref Range   TSH 0.225 (L) 0.350 - 4.500 uIU/mL    Comment: Performed by a 3rd Generation assay with a functional sensitivity of <=0.01 uIU/mL. Performed at [facility], [place] [address] [place], [place] [address]   T4, free     Status: None   Collection Time: 07/28/20  4:58 PM  Result Value Ref Range   Free T4 0.98 0.61 - 1.12 ng/dL    Comment: (NOTE) Biotin ingestion may interfere with free T4 tests. If the results are inconsistent with the TSH level, previous test results, or the clinical presentation, then consider biotin interference. If needed, order repeat testing after stopping biotin. Performed at [facility], [place] [address] [place], [place] [address]   Rapid urine drug screen (hospital performed)     Status: Abnormal   Collection Time: 07/28/20  5:53 PM  Result Value Ref Range   Opiates NONE DETECTED NONE DETECTED   Cocaine POSITIVE (A) NONE DETECTED   Benzodiazepines NONE DETECTED NONE DETECTED   Amphetamines NONE DETECTED NONE DETECTED   Tetrahydrocannabinol POSITIVE (A) NONE DETECTED   Barbiturates NONE DETECTED NONE DETECTED    Comment: (NOTE) DRUG SCREEN FOR MEDICAL PURPOSES ONLY.  IF CONFIRMATION IS NEEDED FOR ANY PURPOSE, NOTIFY LAB WITHIN 5 DAYS.  LOWEST DETECTABLE LIMITS FOR  URINE DRUG SCREEN Drug Class                     Cutoff (ng/mL) Amphetamine and metabolites    1000 Barbiturate and metabolites    200 Benzodiazepine                 163 Tricyclics and metabolites     300 Opiates and metabolites        300 Cocaine and metabolites        300 THC                            50 Performed at [facility], [place] [address] [place], [place] [address]     Blood Alcohol level:  Lab Results  Component Value Date   [facility] <10 07/28/2020   ETH <10 99/35/7017    Metabolic Disorder Labs: Lab Results  Component Value Date   HGBA1C 5.4 07/16/2018   MPG 108.28 07/16/2018   MPG 100 04/09/2012   Lab Results  Component Value Date   PROLACTIN 21.0 (H) 06/21/2016   Lab Results  Component Value Date   CHOL  145 06/14/2020   TRIG 75 06/14/2020   HDL 50 06/14/2020   CHOLHDL 2.9 06/14/2020   VLDL 15 06/14/2020   LDLCALC 80 06/14/2020   LDLCALC 153 (H) 07/16/2018    Physical Findings: AIMS: Facial and Oral Movements Muscles of Facial Expression: None, normal Lips and Perioral Area: None, normal Jaw: None, normal Tongue: None, normal,Extremity Movements Upper (arms, wrists, hands, fingers): None, normal Lower (legs, knees, ankles, toes): None, normal, Trunk Movements Neck, shoulders, hips: None, normal, Overall Severity Severity of abnormal movements (highest score from questions above): None, normal Incapacitation due to abnormal movements: None, normal Patient's awareness of abnormal movements (rate only patient's report): No Awareness, Dental Status Current problems with teeth and/or dentures?: No Does patient usually wear dentures?: No  CIWA:    COWS:  COWS Total Score: 7  Musculoskeletal: Strength & Muscle Tone: within normal limits no tremors, no diaphoresis, no restlessness or psychomotor agitation Gait & Station: normal Patient leans: N/A  Psychiatric Specialty Exam: Physical Exam  Review of Systems no headache, no chest  pain, no shortness of breath, no vomiting, reports some muscular aches, particularly some hip pain, ambulating without difficulty.    Blood pressure 133/85, pulse 80, temperature 97.8 F (36.6 C), temperature source Oral, resp. rate 18, height 5' 10"  (1.778 m), weight 65.8 kg, SpO2 98 %.Body mass index is 20.81 kg/m.  General Appearance: Fairly Groomed  Eye Contact:  Improving eye contact  Speech:  Normal Rate  Volume:  Normal  Mood:  Improving, less dysphoric  Affect:  Partially improved affect, still vaguely irritable but improves during session and smiles at times appropriately during session  Thought Process:  Linear and Descriptions of Associations: Intact  Orientation:  Full (Time, Place, and Person)  Thought Content:  Denies hallucinations, no delusions are currently expressed, does not appear internally preoccupied  Suicidal Thoughts:  No denies suicidal or self-injurious ideations and presents future oriented today  Homicidal Thoughts:  No  Memory:  Recent and remote fair  Judgement:  Other:  Fair/improving  Insight:  Fair  Psychomotor Activity:  Normal  Concentration:  Concentration: Improving and Attention Span: Improving  Recall:  Good  Fund of Knowledge:  Good  Language:  Good  Akathisia:  Negative  Handed:  Right  AIMS (if indicated):     Assets:  Communication Skills Desire for Improvement Resilience  ADL's:  Intact  Cognition:  WNL  Sleep:  Number of Hours: 5.75   Assessment 54 year old male, currently homeless, presented on 8/5 reporting worsening depression, feeling hopeless, experiencing suicidal ideations with thoughts of overdosing on heroin, experiencing neurovegetative symptoms of depression.  Reports history of polysubstance use disorder, identifies cocaine and cannabis as substance of choice.  He also endorses intermittent but not regular heroin use.  Last used 5 days ago.  He denies any recent alcohol use.  Admission UDS positive for cocaine and cannabis,  BAL negative. He is known to our unit from prior admissions for depression, suicidal ideations and substance use disorder.  He was not taking any psychiatric medications prior to admission.  Identifies homelessness as a major stressor  Today patient presents with partial improvement compared to admission.  He is noted to be less dysphoric, with a more reactive affect.  Today denies suicidal ideations and presents future oriented, expressing interest in going to rehab at discharge.  Vitals are stable and does not appear to be in any acute distress or discomfort. Currently tolerating medications well. Of note, reports she has removed a tick from  his thigh area earlier today and several other takes over recent days.  He is afebrile and has no associated rash at this time This reviewed with infectious disease-antibiotic course not recommended at this time.  Patient encouraged to report any rash or other changes to staff promptly Treatment Plan Summary: Daily contact with patient to assess and evaluate symptoms and progress in treatment, Medication management, Plan Inpatient treatment and Medications as below  Treatment plan reviewed as below today 8/7 Encourage group/milieu participation Encourage efforts to work on sobriety and relapse prevention Treatment team working on disposition planning options.  Patient expressing interest in going to rehab. Continue Abilify 5 mg daily for mood disorder Continue Wellbutrin XL 150 mg daily for depression Continue lisinopril 10 mg daily for hypertension (BP today 133/85) Continue Remeron 7.5 mg nightly for insomnia Continue trazodone 50 mg nightly as needed for insomnia as needed. Continue Minipress 2 mg nightly for nightmares Continue Protonix 40 mg daily for gastric protection/GERD symptoms Check EKG to monitor QTc and BMP to follow up on hypokalemia at admission. Jenne Campus, MD 07/30/2020, 12:42 PM

## 2020-07-30 NOTE — Progress Notes (Signed)
Patient did not attend morning group.  

## 2020-07-31 LAB — BASIC METABOLIC PANEL
Anion gap: 9 (ref 5–15)
BUN: 14 mg/dL (ref 6–20)
CO2: 28 mmol/L (ref 22–32)
Calcium: 8 mg/dL — ABNORMAL LOW (ref 8.9–10.3)
Chloride: 101 mmol/L (ref 98–111)
Creatinine, Ser: 0.98 mg/dL (ref 0.61–1.24)
GFR calc Af Amer: >60 mL/min (ref >60)
GFR calc non Af Amer: >60 mL/min (ref >60)
Glucose, Bld: 99 mg/dL (ref 70–99)
Potassium: 3.7 mmol/L (ref 3.5–5.1)
Sodium: 138 mmol/L (ref 135–145)

## 2020-07-31 NOTE — Progress Notes (Signed)
   07/31/20 1100  Psych Admission Type (Psych Patients Only)  Admission Status Voluntary  Psychosocial Assessment  Patient Complaints Depression;Irritability  Eye Contact Brief  Facial Expression Flat  Affect Irritable  Speech Logical/coherent  Interaction Cautious  Motor Activity Unsteady  Appearance/Hygiene Disheveled  Behavior Characteristics Cooperative;Appropriate to situation  Mood Depressed;Helpless  Thought Process  Coherency WDL  Content WDL  Delusions None reported or observed  Perception WDL  Hallucination None reported or observed  Judgment Impaired  Confusion None  Danger to Self  Current suicidal ideation? Denies  Self-Injurious Behavior No self-injurious ideation or behavior indicators observed or expressed   Danger to Others  Danger to Others None reported or observed    Orders reviewed with the pt. V/s reviewed. Verbal support provided. 15 minute checks performed for safety.  Patient compliant with taking medications this morning although he takes them late in the morning. He denies any side effects to the medications.

## 2020-07-31 NOTE — Progress Notes (Signed)
   07/31/20 2204  Psych Admission Type (Psych Patients Only)  Admission Status Voluntary  Psychosocial Assessment  Patient Complaints Depression  Eye Contact Fair  Facial Expression Flat  Affect Appropriate to circumstance  Speech Logical/coherent  Interaction Assertive  Motor Activity Unsteady  Appearance/Hygiene Disheveled  Behavior Characteristics Appropriate to situation  Mood Depressed  Thought Process  Coherency WDL  Content WDL  Delusions None reported or observed  Perception WDL  Hallucination None reported or observed  Judgment Impaired  Confusion None  Danger to Self  Current suicidal ideation? Denies  Self-Injurious Behavior No self-injurious ideation or behavior indicators observed or expressed   Agreement Not to Harm Self Yes  Description of Agreement verbally contracts for safety  Danger to Others  Danger to Others None reported or observed

## 2020-07-31 NOTE — Progress Notes (Signed)
Pt presents less irritable tonight and has been respectful to staff. No agitation or disruptive behaviors noted. Pt was greeted in his room at the beginning of the shift and he continues to remain isolative to his room mainly. Pt reports feeling tired and sleepy even though he rests in bed for the majority of the day. Pt is motivated to attend [facility] for his follow-up care after discharge and so that he's on top of his medication management. Pt denies any opiate withdrawal symptoms. Pt did c/o of bilateral hip pain for which his PRN tylenol was administered. Pt encouraged to attend groups and was more active tonight. Pt was seen in the dayroom several times tonight and he did come up to the medication window to take his scheduled medications. He denies SI/HI and AVH. Active listening, reassurance, and support provided. Q 15 min safety checks continue. Pt's safety has been maintained.    07/30/20 2030  Psych Admission Type (Psych Patients Only)  Admission Status Voluntary  Psychosocial Assessment  Patient Complaints Depression;Irritability  Eye Contact Brief  Facial Expression Flat  Affect Appropriate to circumstance;Depressed  Speech Soft;Logical/coherent  Interaction Assertive;Guarded;Isolative;Minimal  Motor Activity Unsteady;Slow  Appearance/Hygiene Disheveled  Behavior Characteristics Cooperative;Guarded  Mood Depressed;Irritable  Thought Process  Coherency WDL  Content WDL  Delusions None reported or observed  Perception WDL  Hallucination None reported or observed  Judgment Impaired  Confusion None  Danger to Self  Current suicidal ideation? Denies  Self-Injurious Behavior No self-injurious ideation or behavior indicators observed or expressed   Danger to Others  Danger to Others None reported or observed

## 2020-07-31 NOTE — Progress Notes (Signed)
[facility] MD Progress Note  07/31/2020 1:06 PM Chase Moss  MRN:  [id number] Subjective:  Today reports he is feeling " a little better, I guess ". He describes decreased body aches and improved hip pain. He states his mood still depressed, and states " I felt like I wanted to give up", but today denies suicidal ideations and presents future oriented, expressing interest in going to a rehab at discharge. States he is sleeping better, and reports he slept 8 hours last night. Also reports improving appetite. Denies medication side effects.  Objective: I discussed case with treatment team and met with patient. 54 year old male, currently homeless, presented on 8/5 reporting worsening depression, feeling hopeless, experiencing suicidal ideations with thoughts of overdosing on heroin, experiencing neurovegetative symptoms of depression.  Reports history of polysubstance use disorder, identifies cocaine and cannabis as substance of choice.  He also endorses intermittent but not regular heroin use.  Last used 5 days ago.  He denies any recent alcohol use.  Admission UDS positive for cocaine and cannabis, BAL negative. He is known to our unit from prior admissions for depression, suicidal ideations and substance use disorder.  He was not taking any psychiatric medications prior to admission.  Identifies homelessness as a major stressor  Patient presents with partial improvement compared to admission. He remains vaguely dysphoric /depressed, but acknowledges improvement compared to admission, denies SI at this time, and presents future oriented, expressing interest in going to a rehab at discharge. He also reports improving sleep, appetite and is noted to be spending more time out of his room today. Currently presents calm and in no acute distress .  EKG - NSR, QTc 421   Principal Problem: Substance use disorder, substance-induced mood disorder versus MDD Diagnosis: Active Problems:   Bipolar 1 disorder,  depressed ([place])  Total Time spent with patient: 20 minutes  Past Psychiatric History:   Past Medical History:  Past Medical History:  Diagnosis Date  . Bipolar 1 disorder ([place])   . Depression   . Hypertension   . PTSD (post-traumatic stress disorder)    History reviewed. No pertinent surgical history. Family History:  Family History  Problem Relation Age of Onset  . Heart attack Other    Family Psychiatric  History:  Social History:  Social History   Substance and Sexual Activity  Alcohol Use Not Currently     Social History   Substance and Sexual Activity  Drug Use Yes  . Types: Marijuana, Methamphetamines   Comment: heroin    Social History   Socioeconomic History  . Marital status: Divorced    Spouse name: Not on file  . Number of children: Not on file  . Years of education: Not on file  . Highest education level: Not on file  Occupational History  . Not on file  Tobacco Use  . Smoking status: Current Every Day Smoker    Packs/day: 0.25    Years: 30.00    Pack years: 7.50    Types: Cigarettes  . Smokeless tobacco: Never Used  Vaping Use  . Vaping Use: Never used  Substance and Sexual Activity  . Alcohol use: Not Currently  . Drug use: Yes    Types: Marijuana, Methamphetamines    Comment: heroin  . Sexual activity: Yes  Other Topics Concern  . Not on file  Social History Narrative  . Not on file   Social Determinants of Health   Financial Resource Strain:   . Difficulty of Paying Living Expenses:  Food Insecurity:   . Worried About Charity fundraiser in the Last Year:   . Arboriculturist in the Last Year:   Transportation Needs:   . Film/video editor (Medical):   Marland Kitchen Lack of Transportation (Non-Medical):   Physical Activity:   . Days of Exercise per Week:   . Minutes of Exercise per Session:   Stress:   . Feeling of Stress :   Social Connections:   . Frequency of Communication with Friends and Family:   . Frequency of Social  Gatherings with Friends and Family:   . Attends Religious Services:   . Active Member of Clubs or Organizations:   . Attends Archivist Meetings:   Marland Kitchen Marital Status:    Additional Social History:     Sleep: Improving  Appetite:  Improving  Current Medications: Current Facility-Administered Medications  Medication Dose Route Frequency Provider Last Rate Last Admin  . acetaminophen (TYLENOL) tablet 650 mg  650 mg Oral Q6H PRN Lindon Romp A, NP   650 mg at 07/30/20 2144  . alum & mag hydroxide-simeth (MAALOX/MYLANTA) 200-200-20 MG/5ML suspension 30 mL  30 mL Oral Q4H PRN Lindon Romp A, NP      . ARIPiprazole (ABILIFY) tablet 5 mg  5 mg Oral Daily Lindon Romp A, NP   5 mg at 07/31/20 1112  . bacitracin ointment   Topical BID Aydia Maj, Myer Peer, MD   34.1937 application at 90/24/09 1113  . buPROPion (WELLBUTRIN XL) 24 hr tablet 150 mg  150 mg Oral Daily Lindon Romp A, NP   150 mg at 07/31/20 1112  . feeding supplement (ENSURE ENLIVE) (ENSURE ENLIVE) liquid 237 mL  237 mL Oral BID BM Hildur Bayer A, MD   237 mL at 07/31/20 1112  . hydrOXYzine (ATARAX/VISTARIL) tablet 25 mg  25 mg Oral TID PRN Lindon Romp A, NP      . lisinopril (ZESTRIL) tablet 10 mg  10 mg Oral Daily Lindon Romp A, NP   10 mg at 07/31/20 1112  . magnesium hydroxide (MILK OF MAGNESIA) suspension 30 mL  30 mL Oral Daily PRN Lindon Romp A, NP      . mirtazapine (REMERON) tablet 7.5 mg  7.5 mg Oral QHS Lindon Romp A, NP   7.5 mg at 07/30/20 2144  . multivitamin with minerals tablet 1 tablet  1 tablet Oral Daily Kelcy Baeten, Myer Peer, MD   1 tablet at 07/31/20 1114  . pantoprazole (PROTONIX) EC tablet 40 mg  40 mg Oral Daily Lindon Romp A, NP   40 mg at 07/31/20 1112  . prazosin (MINIPRESS) capsule 2 mg  2 mg Oral QHS Lindon Romp A, NP   2 mg at 07/30/20 2144  . traZODone (DESYREL) tablet 50 mg  50 mg Oral QHS PRN Rozetta Nunnery, NP        Lab Results:  No results found for this or any previous visit (from  the past 48 hour(s)).  Blood Alcohol level:  Lab Results  Component Value Date   ETH <10 07/28/2020   ETH <10 73/53/2992    Metabolic Disorder Labs: Lab Results  Component Value Date   HGBA1C 5.4 07/16/2018   MPG 108.28 07/16/2018   MPG 100 04/09/2012   Lab Results  Component Value Date   PROLACTIN 21.0 (H) 06/21/2016   Lab Results  Component Value Date   CHOL 145 06/14/2020   TRIG 75 06/14/2020   HDL 50 06/14/2020   CHOLHDL 2.9 06/14/2020  VLDL 15 06/14/2020   LDLCALC 80 06/14/2020   LDLCALC 153 (H) 07/16/2018    Physical Findings: AIMS: Facial and Oral Movements Muscles of Facial Expression: None, normal Lips and Perioral Area: None, normal Jaw: None, normal Tongue: None, normal,Extremity Movements Upper (arms, wrists, hands, fingers): None, normal Lower (legs, knees, ankles, toes): None, normal, Trunk Movements Neck, shoulders, hips: None, normal, Overall Severity Severity of abnormal movements (highest score from questions above): None, normal Incapacitation due to abnormal movements: None, normal Patient's awareness of abnormal movements (rate only patient's report): No Awareness, Dental Status Current problems with teeth and/or dentures?: No Does patient usually wear dentures?: No  CIWA:    COWS:  COWS Total Score: 7  Musculoskeletal: Strength & Muscle Tone: within normal limits no tremors, no diaphoresis, no restlessness or psychomotor agitation Gait & Station: normal Patient leans: N/A  Psychiatric Specialty Exam: Physical Exam  Review of Systems  No headache, no chest pain, no shortness of breath, denies rash    Blood pressure (!) 156/98, pulse 77, temperature 97.9 F (36.6 C), temperature source Oral, resp. rate 18, height 5' 10"  (1.778 m), weight 65.8 kg, SpO2 98 %.Body mass index is 20.81 kg/m.  General Appearance: improving grooming   Eye Contact:  improving  Speech:  Normal Rate  Volume:  Normal  Mood:  remains vaguely depressed and  constricted, but improving compared to admission  Affect:  remains dysphoric   Thought Process:  Linear and Descriptions of Associations: Intact  Orientation:  Full (Time, Place, and Person)  Thought Content:  Denies hallucinations, no delusions are currently expressed, does not appear internally preoccupied  Suicidal Thoughts:  No denies suicidal or self-injurious ideations and presents future oriented today  Homicidal Thoughts:  No  Memory:  Recent and remote fair  Judgement:  Fair- improving   Insight:  Fair  Psychomotor Activity:  Normal  Concentration:  Concentration: Improving and Attention Span: Improving  Recall:  Good  Fund of Knowledge:  Good  Language:  Good  Akathisia:  Negative  Handed:  Right  AIMS (if indicated):     Assets:  Communication Skills Desire for Improvement Resilience  ADL's:  Intact  Cognition:  WNL  Sleep:  Number of Hours: 5.75   Assessment 54 year old male, currently homeless, presented on 8/5 reporting worsening depression, feeling hopeless, experiencing suicidal ideations with thoughts of overdosing on heroin, experiencing neurovegetative symptoms of depression.  Reports history of polysubstance use disorder, identifies cocaine and cannabis as substance of choice.  He also endorses intermittent but not regular heroin use.  Last used 5 days ago.  He denies any recent alcohol use.  Admission UDS positive for cocaine and cannabis, BAL negative. He is known to our unit from prior admissions for depression, suicidal ideations and substance use disorder.  He was not taking any psychiatric medications prior to admission.  Identifies homelessness as a major stressor  Presents alert,attentive,and oriented x 3. He remains vaguely depressed and dysphoric, but today acknowledges he is " feeling a little better". Denies suicidal ideation and presents future oriented , stating he wants to go to a rehab at discharge and hopefully after that , " find a place that will  allow me to have a pet ". Currently tolerating medications well, denies medication side effects. BMP had been ordered to follow up on hypokalemia- has not been done yet . States he will comply with blood draw this PM.  Treatment Plan Summary: Daily contact with patient to assess and evaluate symptoms and progress  in treatment, Medication management, Plan Inpatient treatment and Medications as below  Treatment plan reviewed as below today 8/8 Encourage group/milieu participation Encourage efforts to work on sobriety and relapse prevention Treatment team working on disposition planning options.  Patient expressing interest in going to rehab. Continue Abilify 5 mg daily for mood disorder Continue Wellbutrin XL 150 mg daily for depression Continue lisinopril 10 mg daily for hypertension (BP today 133/85) Continue Remeron 7.5 mg nightly for insomnia Continue Trazodone 50 mg nightly as needed for insomnia as needed. Continue Minipress 2 mg nightly for nightmares Continue Protonix 40 mg daily for gastric protection/GERD symptoms BMP ordered , currently pending  Jenne Campus, MD 07/31/2020, 1:06 PM   Patient ID: Chase Moss, male   DOB: March 31, 1966, 54 y.o.   MRN: [id number]

## 2020-07-31 NOTE — Progress Notes (Signed)
Pt brought up a thc vapor pen to the nurses station and stated that it was in his pants that was put in our laundry.  Pt stated that the pen leaked all over his pants and that the pen broke leaving pieces of glass in his pants pocket.  Pt also stated that he wants a refund for this item as it is new or wants a replacement. Pt was told to contact patient experience upon discharge.  Charge nurse and [facility] were both notified.

## 2020-07-31 NOTE — Progress Notes (Signed)
Pt refused lab work again this AM that was originally rescheduled since he refused yesterday as well. Pt presents irritated and continues to c/o hip pain rating it at 9 on a scale of 0-10. Pt refused his lab work because of the hip pain. Pt offered tylenol but refused stating that it hasn't been touching the pain. Pt asked if he had a fall recently, pt said "a long time ago." Per MD notes, he said it was about 2 weeks ago. Pt said he never had an x-ray on it. Pt said that he would possibly try getting his lab work this evening if his hip pain decreases. Pt educated about the importance of checking his lab work because his K was 2.7 on the 07/28/2020 and even though it was replenished, we need to ensure that it is within normal range now. Also to monitor his lab values due to the current medications he's prescribed. Pt continued to refused. NP, Nira Conn notified.

## 2020-07-31 NOTE — Progress Notes (Signed)
   07/31/20 2201  COVID-19 Daily Checkoff  Have you had a fever (temp > 37.80C/100F)  in the past 24 hours?  No  If you have had runny nose, nasal congestion, sneezing in the past 24 hours, has it worsened? No  COVID-19 EXPOSURE  Have you traveled outside the state in the past 14 days? No  Have you been in contact with someone with a confirmed diagnosis of COVID-19 or PUI in the past 14 days without wearing appropriate PPE? No  Have you been living in the same home as a person with confirmed diagnosis of COVID-19 or a PUI (household contact)? No  Have you been diagnosed with COVID-19? No

## 2020-07-31 NOTE — Progress Notes (Signed)
   07/30/20 2030  COVID-19 Daily Checkoff  Have you had a fever (temp > 37.80C/100F)  in the past 24 hours?  No  COVID-19 EXPOSURE  Have you traveled outside the state in the past 14 days? No  Have you been in contact with someone with a confirmed diagnosis of COVID-19 or PUI in the past 14 days without wearing appropriate PPE? No  Have you been living in the same home as a person with confirmed diagnosis of COVID-19 or a PUI (household contact)? No  Have you been diagnosed with COVID-19? No

## 2020-08-01 DIAGNOSIS — F1424 Cocaine dependence with cocaine-induced mood disorder: Secondary | ICD-10-CM

## 2020-08-01 MED ORDER — DICLOFENAC SODIUM 1 % EX GEL: 2.0000 g | Freq: Three times a day (TID) | CUTANEOUS | 0 refills | Status: DC | PRN

## 2020-08-01 MED ORDER — DICLOFENAC SODIUM 1 % EX GEL: 2.0000 g | Freq: Three times a day (TID) | CUTANEOUS | Status: DC | PRN

## 2020-08-01 MED ORDER — PRAZOSIN HCL 2 MG PO CAPS: 2.0000 mg | ORAL_CAPSULE | Freq: Every day | ORAL | 0 refills | Status: DC

## 2020-08-01 MED ORDER — MIRTAZAPINE 7.5 MG PO TABS: 7.5000 mg | ORAL_TABLET | Freq: Every day | ORAL | 0 refills | Status: DC

## 2020-08-01 MED ORDER — DICLOFENAC SODIUM 1 % EX GEL: 2.0000 g | Freq: Four times a day (QID) | CUTANEOUS | Status: DC

## 2020-08-01 MED ORDER — BACITRACIN ZINC 500 UNIT/GM EX OINT: TOPICAL_OINTMENT | Freq: Two times a day (BID) | CUTANEOUS | 0 refills | Status: DC

## 2020-08-01 MED ORDER — LISINOPRIL 10 MG PO TABS: 10.0000 mg | ORAL_TABLET | Freq: Every day | ORAL | 0 refills | Status: AC

## 2020-08-01 MED ORDER — ARIPIPRAZOLE 5 MG PO TABS: 5.0000 mg | ORAL_TABLET | Freq: Every day | ORAL | 0 refills | Status: DC

## 2020-08-01 MED ORDER — PANTOPRAZOLE SODIUM 40 MG PO TBEC: 40.0000 mg | DELAYED_RELEASE_TABLET | Freq: Every day | ORAL | 0 refills | Status: DC

## 2020-08-01 MED ORDER — BUPROPION HCL ER (XL) 150 MG PO TB24: 150.0000 mg | ORAL_TABLET | Freq: Every day | ORAL | 0 refills | Status: DC

## 2020-08-01 NOTE — Progress Notes (Signed)
Recreation Therapy Notes  Date:  8.9.21 Time: 0930 Location: 300 Hall Dayroom  Group Topic: Stress Management  Goal Area(s) Addresses:  Patient will identify positive stress management techniques. Patient will identify benefits of using stress management post d/c.  Intervention: Stress Management  Activity: Meditation.  LRT played a meditation that focused on having gratitude for people in your life who are supportive.  Patients were to listen and follow along as meditation played to engage in activity.   Education:  Stress Management, Discharge Planning.   Education Outcome: Acknowledges Education  Clinical Observations/Feedback: Pt did not attend group activity.    Caroll Rancher, LRT/CTRS         Lillia Abed, Jerel Sardina A 08/01/2020 10:50 AM

## 2020-08-01 NOTE — Tx Team (Cosign Needed)
Interdisciplinary Treatment and Diagnostic Plan Update  08/01/2020 Time of Session: 9:15am Chase Moss MRN: [id number]  Principal Diagnosis: <principal problem not specified>  Secondary Diagnoses: Active Problems:   Bipolar 1 disorder, depressed ([facility])   Current Medications:  Current Facility-Administered Medications  Medication Dose Route Frequency Provider Last Rate Last Admin   acetaminophen (TYLENOL) tablet 650 mg  650 mg Oral Q6H PRN Nira Conn A, NP   650 mg at 08/01/20 0858   alum & mag hydroxide-simeth (MAALOX/MYLANTA) 200-200-20 MG/5ML suspension 30 mL  30 mL Oral Q4H PRN Nira Conn A, NP       ARIPiprazole (ABILIFY) tablet 5 mg  5 mg Oral Daily Nira Conn A, NP   5 mg at 08/01/20 8341   bacitracin ointment   Topical BID Cobos, Rockey Situ, MD   31.1111 application at 08/01/20 0829   buPROPion (WELLBUTRIN XL) 24 hr tablet 150 mg  150 mg Oral Daily Nira Conn A, NP   150 mg at 08/01/20 9622   feeding supplement (ENSURE ENLIVE) (ENSURE ENLIVE) liquid 237 mL  237 mL Oral BID BM Cobos, Fernando A, MD   237 mL at 07/31/20 2140   hydrOXYzine (ATARAX/VISTARIL) tablet 25 mg  25 mg Oral TID PRN Jackelyn Poling, NP       lisinopril (ZESTRIL) tablet 10 mg  10 mg Oral Daily Nira Conn A, NP   10 mg at 08/01/20 2979   magnesium hydroxide (MILK OF MAGNESIA) suspension 30 mL  30 mL Oral Daily PRN Nira Conn A, NP       mirtazapine (REMERON) tablet 7.5 mg  7.5 mg Oral QHS Nira Conn A, NP   7.5 mg at 07/31/20 2058   multivitamin with minerals tablet 1 tablet  1 tablet Oral Daily Cobos, Rockey Situ, MD   1 tablet at 08/01/20 0832   pantoprazole (PROTONIX) EC tablet 40 mg  40 mg Oral Daily Nira Conn A, NP   40 mg at 08/01/20 8921   prazosin (MINIPRESS) capsule 2 mg  2 mg Oral QHS Nira Conn A, NP   2 mg at 07/31/20 2058   traZODone (DESYREL) tablet 50 mg  50 mg Oral QHS PRN Jackelyn Poling, NP       PTA Medications: Medications Prior to Admission  Medication Sig  Dispense Refill Last Dose   ARIPiprazole (ABILIFY) 5 MG tablet Take 1 tablet (5 mg total) by mouth daily. For mood control (Patient not taking: Reported on 07/28/2020) 30 tablet 0    buPROPion (WELLBUTRIN XL) 150 MG 24 hr tablet Take 1 tablet (150 mg total) by mouth daily. For depression (Patient not taking: Reported on 07/28/2020) 30 tablet 0    hydrOXYzine (ATARAX/VISTARIL) 25 MG tablet Take 1 tablet (25 mg total) by mouth every 6 (six) hours as needed for anxiety. (Patient not taking: Reported on 07/28/2020) 75 tablet 0    lisinopril (ZESTRIL) 10 MG tablet Take 1 tablet (10 mg total) by mouth daily. For high blood pressure (Patient not taking: Reported on 07/28/2020) 30 tablet 0    mirtazapine (REMERON) 7.5 MG tablet Take 1 tablet (7.5 mg total) by mouth at bedtime. For sleep (Patient not taking: Reported on 07/28/2020) 30 tablet 0    pantoprazole (PROTONIX) 40 MG tablet Take 1 tablet (40 mg total) by mouth daily. For acid reflux (Patient not taking: Reported on 07/28/2020) 30 tablet 0    prazosin (MINIPRESS) 2 MG capsule Take 1 capsule (2 mg total) by mouth at bedtime. For nightmares (Patient not  taking: Reported on 07/28/2020) 30 capsule 0    traZODone (DESYREL) 50 MG tablet Take 1 tablet (50 mg total) by mouth at bedtime as needed for sleep. (Patient not taking: Reported on 07/28/2020) 30 tablet 0     Patient Stressors:    Patient Strengths:    Treatment Modalities: Medication Management, Group therapy, Case management,  1 to 1 session with clinician, Psychoeducation, Recreational therapy.   Physician Treatment Plan for Primary Diagnosis: <principal problem not specified> Long Term Goal(s): Improvement in symptoms so as ready for discharge Improvement in symptoms so as ready for discharge   Short Term Goals: Ability to identify changes in lifestyle to reduce recurrence of condition will improve Ability to verbalize feelings will improve Ability to disclose and discuss suicidal ideas Ability  to demonstrate self-control will improve Ability to identify and develop effective coping behaviors will improve Ability to maintain clinical measurements within normal limits will improve Ability to identify changes in lifestyle to reduce recurrence of condition will improve Ability to verbalize feelings will improve Ability to disclose and discuss suicidal ideas Ability to demonstrate self-control will improve Ability to identify and develop effective coping behaviors will improve Ability to maintain clinical measurements within normal limits will improve  Medication Management: Evaluate patient's response, side effects, and tolerance of medication regimen.  Therapeutic Interventions: 1 to 1 sessions, Unit Group sessions and Medication administration.  Evaluation of Outcomes: Adequate for Discharge  Physician Treatment Plan for Secondary Diagnosis: Active Problems:   Bipolar 1 disorder, depressed ([facility])  Long Term Goal(s): Improvement in symptoms so as ready for discharge Improvement in symptoms so as ready for discharge   Short Term Goals: Ability to identify changes in lifestyle to reduce recurrence of condition will improve Ability to verbalize feelings will improve Ability to disclose and discuss suicidal ideas Ability to demonstrate self-control will improve Ability to identify and develop effective coping behaviors will improve Ability to maintain clinical measurements within normal limits will improve Ability to identify changes in lifestyle to reduce recurrence of condition will improve Ability to verbalize feelings will improve Ability to disclose and discuss suicidal ideas Ability to demonstrate self-control will improve Ability to identify and develop effective coping behaviors will improve Ability to maintain clinical measurements within normal limits will improve     Medication Management: Evaluate patient's response, side effects, and tolerance of medication  regimen.  Therapeutic Interventions: 1 to 1 sessions, Unit Group sessions and Medication administration.  Evaluation of Outcomes: Adequate for Discharge   RN Treatment Plan for Primary Diagnosis: <principal problem not specified> Long Term Goal(s): Knowledge of disease and therapeutic regimen to maintain health will improve  Short Term Goals: Ability to remain free from injury will improve, Ability to verbalize frustration and anger appropriately will improve, Ability to identify and develop effective coping behaviors will improve and Compliance with prescribed medications will improve  Medication Management: RN will administer medications as ordered by provider, will assess and evaluate patient's response and provide education to patient for prescribed medication. RN will report any adverse and/or side effects to prescribing provider.  Therapeutic Interventions: 1 on 1 counseling sessions, Psychoeducation, Medication administration, Evaluate responses to treatment, Monitor vital signs and CBGs as ordered, Perform/monitor CIWA, COWS, AIMS and Fall Risk screenings as ordered, Perform wound care treatments as ordered.  Evaluation of Outcomes: Adequate for Discharge   LCSW Treatment Plan for Primary Diagnosis: <principal problem not specified> Long Term Goal(s): Safe transition to appropriate next level of care at discharge, Engage patient in therapeutic  group addressing interpersonal concerns.  Short Term Goals: Engage patient in aftercare planning with referrals and resources, Increase social support, Identify triggers associated with mental health/substance abuse issues and Increase skills for wellness and recovery  Therapeutic Interventions: Assess for all discharge needs, 1 to 1 time with Social worker, Explore available resources and support systems, Assess for adequacy in community support network, Educate family and significant other(s) on suicide prevention, Complete Psychosocial  Assessment, Interpersonal group therapy.  Evaluation of Outcomes: Adequate for Discharge   Progress in Treatment: Attending groups: No. Participating in groups: No. Taking medication as prescribed: Yes. Toleration medication: Yes. Family/Significant other contact made: No, will contact:  patient declined consents. Patient understands diagnosis: Yes. Discussing patient identified problems/goals with staff: Yes. Medical problems stabilized or resolved: Yes. Denies suicidal/homicidal ideation: Yes. Issues/concerns per patient self-inventory: No.   New problem(s) identified: No, Describe:  none.  New Short Term/Long Term Goal(s):  Patient Goals:  Pt did not attend.  Discharge Plan or Barriers: none.  Reason for Continuation of Hospitalization: Anxiety Depression Medication stabilization  Estimated Length of Stay: 1 day.   Attendees: Patient: Did not attend 08/01/2020   Physician:  08/01/2020  Nursing:  08/01/2020  RN Care Manager: 08/01/2020   Social Worker: Ruthann Cancer, LCSW 08/01/2020  Recreational Therapist:  08/01/2020   Other:  08/01/2020   Other:  08/01/2020   Other: 08/01/2020       Scribe for Treatment Team: Otelia Santee, LCSW 08/01/2020 2:51 PM

## 2020-08-01 NOTE — Progress Notes (Signed)
   08/01/20 0833  Vital Signs  Temp 98 F (36.7 C)  Temp Source Oral  Pulse Rate 69  BP 137/75  BP Location Left Arm  BP Method Automatic  Patient Position (if appropriate) Lying   D: Patient denies SI/HI/AVH. Patient reports right hip pain 7/10. Tylenol 650 mg was given. Patient rates anxiety and depression 7/10. Patient isolates in room. A:  Patient took scheduled medicine.  Support and encouragement provided Routine safety checks conducted every 15 minutes. Patient  Informed to notify staff with any concerns.   R: Safety maintained.
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
## 2020-08-01 NOTE — [facility] Suicide Risk Assessment (Addendum)
[facility] Discharge Suicide Risk Assessment   Principal Problem:Substance Induced Mood Disorder versus MDD. Cocaine /Cannabis/Opiate Use Disorder  Discharge Diagnoses:Substance Induced Mood Disorder versus MDD. Cocaine /Cannabis/Opiate Use Disorder   Total Time spent with patient: 30 minutes  Musculoskeletal: Strength & Muscle Tone: within normal limits Gait & Station: normal Patient leans: N/A  Psychiatric Specialty Exam: Review of Systems no headache, no chest pain, no shortness of breath, no vomiting , no fever or chills, no rash , reports chronic R hip pain .  Blood pressure 137/75, pulse 69, temperature 98 F (36.7 C), temperature source Oral, resp. rate 18, height 5\' 10"  (1.778 m), weight 65.8 kg, SpO2 98 %.Body mass index is 20.81 kg/m.  General Appearance: Well Groomed  Eye Contact::  Good  Speech:  Normal Rate409  Volume:  Normal  Mood:  improving , states mood today 5/10  Affect:  improving, remains vaguely dysphoric  Thought Process:  Linear and Descriptions of Associations: Intact  Orientation:  Full (Time, Place, and Person)  Thought Content:  no hallucinations, no delusions, not internally preoccupied   Suicidal Thoughts:  No denies suicidal ideations, denies self injurious ideations  Homicidal Thoughts:  No denies homicidal or violent ideations  Memory:  recent and remote grossly intact   Judgement:  Other:  fair- improving   Insight:  fair- improving   Psychomotor Activity:  Normal- no psychomotor agitation or restlessness   Concentration:  Good  Recall:  Good  Fund of Knowledge:Good  Language: Good  Akathisia:  Negative  Handed:  Right  AIMS (if indicated):     Assets:  Communication Skills Desire for Improvement Resilience  Sleep:  Number of Hours: 5.75  Cognition: WNL  ADL's:  Intact   Mental Status Per Nursing Assessment::   On Admission:  Suicidal ideation indicated by patient  Demographic Factors:  40 y old male, currently homeless  Loss  Factors: Homelessness, substance use disorder   Historical Factors: History pr prior psychiatric admissions, history of depression, history of substance use disorder , history of cocaine and opiate use disorder .  Risk Reduction Factors:   Positive coping skills or problem solving skills  Continued Clinical Symptoms:  Today presents alert, attentive, oriented x 3, reports mood as partially improved, affect remains vaguely dysphoric, but has improved compared to admission, no thought disorder, no suicidal or self injurious ideations, no homicidal or violent ideations, no hallucinations,no delusions , not internally preoccupied . No disruptive or agitated behaviors on unit, limited milieu participation. He reports some residual diffuse aches , which he feels may be  related to residual opiate WDL ( reported last heroin use was about 5 days prior to his admission). No vomiting, no diarrhea, tolerating PO intake well. No current WDL symptoms noted - no tremors, no diaphoresis, no restlessness or agitation, vitals stable Denies medication side effects.  Labs reviewed - 8/8 BMP unremarkable . 8/5 TSH 0.225. T4 WNL. Patient encouraged to follow up with PCP for further monitoring  *He reports R hip area pain- states this is chronic. Gait is normal.  Interested in topical analgesic to address . Will order Voltaren topical PRN for pain.   As noted in prior notes, patient reports recently removing several ticks from his body.  I have reviewed importance of reporting a new  rash or new / systemic symptoms to his outpatient MD for appropriate work up and treatment if indicated .  Cognitive Features That Contribute To Risk:  No gross cognitive deficits noted upon discharge. Is alert ,  attentive, and oriented x 3   Suicide Risk:  Mild:  Suicidal ideation of limited frequency, intensity, duration, and specificity.  There are no identifiable plans, no associated intent, mild dysphoria and related symptoms,  good self-control (both objective and subjective assessment), few other risk factors, and identifiable protective factors, including available and accessible social support.   Follow-up Information    [facility]. Go on 08/02/2020.   Why: A referral has been made to this provider on your behalf for residential treatment services.  Contact information: [address] [place] [address] [phone]               Plan Of Care/Follow-up recommendations:  Activity:  as tolerated  Diet:  regular Tests:  NA Other:  See below  Patient is going to [facility]. He is scheduled  for 8/10 in AM and is leaving [facility] early in the morning . He also has an established PCP , I have encouraged him to follow up with PCP in the next 4-6 , monitor Thyroid Function tests.   Craige Cotta, MD 08/01/2020, 4:52 PM

## 2020-08-01 NOTE — [facility] Group Notes (Signed)
Occupational Therapy Group Note Date: 08/01/2020 Group Topic/Focus: Life Skills/Feelings Management  Group Description: Group encouraged increased participation and engagement through discussion/worksheet focused on "Breaking Down Barriers". Patients were encouraged to complete a worksheet that identified barriers to seeking treatment, asking for help, and engaging in mental health services. Discussion focused on first identifying these barriers and then brainstorming strategies to confront/challenge identified barriers.  Participation Level: Non-verbal and Minimal   Participation Quality: Did not participate, late arrival   Behavior: Withdrawn   Speech/Thought Process: Distracted   Affect/Mood: Constricted   Insight: Limited   Judgement: Limited   Individualization: Chase Moss joined group late, with 15 minutes remaining, and appeared largely distracted, sedated, and withdrawn from discussion. He made a few comments under his breath, though when acknowledged, made statements unrelated to group (ie "I need to shave"). Pt did not complete worksheet and did not contribute to group discussion.   Modes of Intervention: Activity, Discussion and Education  Patient Response to Interventions:  Disengaged   Plan: Continue to engage patient in OT groups 2 - 3x/week.  Donne Hazel, MOT, OTR/L

## 2020-08-02 NOTE — Progress Notes (Signed)
  [facility] Adult Case Management Discharge Plan :  Will you be returning to the same living situation after discharge:  No. Will be transferred to residential substance use treatment.  At discharge, do you have transportation home?: No. Safe Transport will be arranged.  Do you have the ability to pay for your medications: Yes,  has insurance.  Release of information consent forms completed and in the chart;  Patient's signature needed at discharge.  Patient to Follow up at:  Follow-up Information    [facility]. Go on 08/02/2020.   Why: A referral has been made to this provider on your behalf for residential treatment services.  Contact information: [address] [place] [address] [phone]               Next level of care provider has access to [facility] Link:no  Safety Planning and Suicide Prevention discussed: Yes,  with patient.     Has patient been referred to the Quitline?: Patient refused referral  Patient has been referred for addiction treatment: Yes  Otelia Santee, LCSW 08/02/2020, 9:03 AM

## 2020-08-02 NOTE — Progress Notes (Signed)
   08/02/20 0755  Vital Signs  Pulse Rate 83  BP 139/85  BP Method Automatic  Discharge Note:  Patient denies SI/HI AVH at this time. Discharge instructions, AVS, prescriptions, samples and transition record gone over with patient. Patient agrees to comply with medication management, follow-up visit, and outpatient therapy. Patient belongings returned to patient. Patient complained that his vape was broken by staff. Patient was told to buy a new Vape and bring the receipt back to [facility]. Patient questions and concerns addressed and answered.  Patient ambulatory off unit.  Patient discharged.

## 2020-08-02 NOTE — Progress Notes (Signed)
He denies SI/HI/AVH . He spent most of the evening in the dayroom with peers watching t.v.He still reports anxiety and depression. He was complaint with medication regime. No behavioral issues to report on shift at this time. He is currently resting in bed.

## 2020-09-04 ENCOUNTER — Encounter (HOSPITAL_COMMUNITY): Payer: Self-pay | Admitting: Emergency Medicine

## 2020-09-04 ENCOUNTER — Emergency Department (HOSPITAL_COMMUNITY)
Admission: EM | Admit: 2020-09-04 | Discharge: 2020-09-05 | Disposition: A | Discharge status: Home / Self Care | Payer: Medicaid Other | Attending: Emergency Medicine | Admitting: Emergency Medicine

## 2020-09-04 ENCOUNTER — Other Ambulatory Visit: Payer: Self-pay

## 2020-09-04 DIAGNOSIS — T401X1A Poisoning by heroin, accidental (unintentional), initial encounter: Secondary | ICD-10-CM | POA: Insufficient documentation

## 2020-09-04 DIAGNOSIS — I10 Essential (primary) hypertension: Secondary | ICD-10-CM | POA: Insufficient documentation

## 2020-09-04 DIAGNOSIS — Z0279 Encounter for issue of other medical certificate: Secondary | ICD-10-CM | POA: Diagnosis present

## 2020-09-04 DIAGNOSIS — F17210 Nicotine dependence, cigarettes, uncomplicated: Secondary | ICD-10-CM | POA: Diagnosis not present

## 2020-09-04 DIAGNOSIS — Z79899 Other long term (current) drug therapy: Secondary | ICD-10-CM | POA: Insufficient documentation

## 2020-09-04 LAB — COMPREHENSIVE METABOLIC PANEL
ALT: 16 U/L (ref 0–44)
AST: 19 U/L (ref 15–41)
Albumin: 4.5 g/dL (ref 3.5–5.0)
Alkaline Phosphatase: 86 U/L (ref 38–126)
Anion gap: 9 (ref 5–15)
BUN: 22 mg/dL — ABNORMAL HIGH (ref 6–20)
CO2: 31 mmol/L (ref 22–32)
Calcium: 9.3 mg/dL (ref 8.9–10.3)
Chloride: 103 mmol/L (ref 98–111)
Creatinine, Ser: 1.35 mg/dL — ABNORMAL HIGH (ref 0.61–1.24)
GFR calc Af Amer: >60 mL/min (ref >60)
GFR calc non Af Amer: 58 mL/min — ABNORMAL LOW (ref >60)
Glucose, Bld: 85 mg/dL (ref 70–99)
Potassium: 4.1 mmol/L (ref 3.5–5.1)
Sodium: 143 mmol/L (ref 135–145)
Total Bilirubin: 0.5 mg/dL (ref 0.3–1.2)
Total Protein: 7.6 g/dL (ref 6.5–8.1)

## 2020-09-04 LAB — CBC
HCT: 47.2 % (ref 39.0–52.0)
Hemoglobin: 15.5 g/dL (ref 13.0–17.0)
MCH: 29.7 pg (ref 26.0–34.0)
MCHC: 32.8 g/dL (ref 30.0–36.0)
MCV: 90.4 fL (ref 80.0–100.0)
Platelets: 258 10*3/uL (ref 150–400)
RBC: 5.22 MIL/uL (ref 4.22–5.81)
RDW: 13.5 % (ref 11.5–15.5)
WBC: 9.8 10*3/uL (ref 4.0–10.5)
nRBC: 0 % (ref 0.0–0.2)

## 2020-09-04 LAB — SALICYLATE LEVEL: Salicylate Lvl: <7 mg/dL — ABNORMAL LOW (ref 7.0–30.0)

## 2020-09-04 LAB — ETHANOL: Alcohol, Ethyl (B): <10 mg/dL (ref <10)

## 2020-09-04 LAB — ACETAMINOPHEN LEVEL: Acetaminophen (Tylenol), Serum: <10 ug/mL — ABNORMAL LOW (ref 10–30)

## 2020-09-04 NOTE — ED Provider Notes (Signed)
[facility] DEPT Provider Note: Chase Dell, MD, FACEP  CSN: [id number] MRN: [id number] ARRIVAL: 09/04/20 at 2104 ROOM: WTR5/WTR5   CHIEF COMPLAINT  Medical Clearance   HISTORY OF PRESENT ILLNESS  09/04/20 11:32 PM Chase Moss is a 54 y.o. male who admits to snorting heroin and smoking marijuana.  He snorted heroin this afternoon and had to be given Narcan twice.  This was about 6 PM.  The patient admits he does not really care if he lives or dies but denies active suicidal thoughts, suicidal intent, or suicide attempt.  He states "I've got a drug habit".  He was brought here for medical clearance prior to being taken to jail on outstanding warrants.  He denies any acute somatic complaints.  He states he has overdosed on heroin in the past requiring intervention.   Past Medical History:  Diagnosis Date  . Bipolar 1 disorder (HCC)   . Depression   . Hypertension   . PTSD (post-traumatic stress disorder)     History reviewed. No pertinent surgical history.  Family History  Problem Relation Age of Onset  . Heart attack Other     Social History   Tobacco Use  . Smoking status: Current Every Day Smoker    Packs/day: 0.25    Years: 30.00    Pack years: 7.50    Types: Cigarettes  . Smokeless tobacco: Never Used  Vaping Use  . Vaping Use: Never used  Substance Use Topics  . Alcohol use: Not Currently  . Drug use: Yes    Types: Marijuana, Methamphetamines    Comment: heroin    Prior to Admission medications   Medication Sig Start Date End Date Taking? Authorizing Provider  ARIPiprazole (ABILIFY) 5 MG tablet Take 1 tablet (5 mg total) by mouth daily. 08/02/20   Aldean Baker, NP  bacitracin ointment Apply topically 2 (two) times daily. 08/01/20   Aldean Baker, NP  buPROPion (WELLBUTRIN XL) 150 MG 24 hr tablet Take 1 tablet (150 mg total) by mouth daily. 08/02/20   Aldean Baker, NP  diclofenac Sodium (VOLTAREN) 1 % GEL Apply 2 g topically 3 (three) times daily as  needed (pain). 08/01/20   Karsten Ro, MD  lisinopril (ZESTRIL) 10 MG tablet Take 1 tablet (10 mg total) by mouth daily. 08/02/20   Aldean Baker, NP  mirtazapine (REMERON) 7.5 MG tablet Take 1 tablet (7.5 mg total) by mouth at bedtime. 08/01/20   Aldean Baker, NP  pantoprazole (PROTONIX) 40 MG tablet Take 1 tablet (40 mg total) by mouth daily. 08/02/20   Aldean Baker, NP  prazosin (MINIPRESS) 2 MG capsule Take 1 capsule (2 mg total) by mouth at bedtime. 08/01/20   Aldean Baker, NP    Allergies Patient has no known allergies.   REVIEW OF SYSTEMS  Negative except as noted here or in the History of Present Illness.   PHYSICAL EXAMINATION  Initial Vital Signs Blood pressure (!) 131/91, pulse 61, temperature 99.3 F (37.4 C), temperature source Oral, resp. rate 15, height 5\' 10"  (1.778 m), weight 77.1 kg, SpO2 98 %.  Examination General: Well-developed, well-nourished male in no acute distress; appearance consistent with age of record HENT: normocephalic; atraumatic Eyes: Miosis; extraocular muscles intact Neck: supple Heart: regular rate and rhythm Lungs: clear to auscultation bilaterally Abdomen: soft; nondistended; nontender; bowel sounds present Extremities: No deformity; full range of motion; pulses normal Neurologic: Awake, alert and oriented; motor function intact in all extremities and symmetric; no facial  droop Skin: Warm and dry Psychiatric: Denies HI, SI; expresses frustration with life   RESULTS  Summary of this visit's results, reviewed and interpreted by myself:   EKG Interpretation  Date/Time:    Ventricular Rate:    PR Interval:    QRS Duration:   QT Interval:    QTC Calculation:   R Axis:     Text Interpretation:        Laboratory Studies: Results for orders placed or performed during the hospital encounter of 09/04/20 (from the past 24 hour(s))  Comprehensive metabolic panel     Status: Abnormal   Collection Time: 09/04/20  9:24 PM  Result Value  Ref Range   Sodium 143 135 - 145 mmol/L   Potassium 4.1 3.5 - 5.1 mmol/L   Chloride 103 98 - 111 mmol/L   CO2 31 22 - 32 mmol/L   Glucose, Bld 85 70 - 99 mg/dL   BUN 22 (H) 6 - 20 mg/dL   Creatinine, Ser 0.93 (H) 0.61 - 1.24 mg/dL   Calcium 9.3 8.9 - 23.5 mg/dL   Total Protein 7.6 6.5 - 8.1 g/dL   Albumin 4.5 3.5 - 5.0 g/dL   AST 19 15 - 41 U/L   ALT 16 0 - 44 U/L   Alkaline Phosphatase 86 38 - 126 U/L   Total Bilirubin 0.5 0.3 - 1.2 mg/dL   GFR calc non Af Amer 58 (L) >60 mL/min   GFR calc Af Amer >60 >60 mL/min   Anion gap 9 5 - 15  Ethanol     Status: None   Collection Time: 09/04/20  9:24 PM  Result Value Ref Range   Alcohol, Ethyl (B) <10 <10 mg/dL  Salicylate level     Status: Abnormal   Collection Time: 09/04/20  9:24 PM  Result Value Ref Range   Salicylate Lvl <7.0 (L) 7.0 - 30.0 mg/dL  Acetaminophen level     Status: Abnormal   Collection Time: 09/04/20  9:24 PM  Result Value Ref Range   Acetaminophen (Tylenol), Serum <10 (L) 10 - 30 ug/mL  cbc     Status: None   Collection Time: 09/04/20  9:24 PM  Result Value Ref Range   WBC 9.8 4.0 - 10.5 K/uL   RBC 5.22 4.22 - 5.81 MIL/uL   Hemoglobin 15.5 13.0 - 17.0 g/dL   HCT 57.3 39 - 52 %   MCV 90.4 80.0 - 100.0 fL   MCH 29.7 26.0 - 34.0 pg   MCHC 32.8 30.0 - 36.0 g/dL   RDW 22.0 25.4 - 27.0 %   Platelets 258 150 - 400 K/uL   nRBC 0.0 0.0 - 0.2 %   Imaging Studies: No results found.  ED COURSE and MDM  Nursing notes, initial and subsequent vitals signs, including pulse oximetry, reviewed and interpreted by myself.  Vitals:   09/04/20 2112 09/04/20 2113  BP:  (!) 131/91  Pulse:  61  Resp:  15  Temp:  99.3 F (37.4 C)  TempSrc:  Oral  SpO2:  98%  Weight: 77.1 kg   Height: 5\' 10"  (1.778 m)    Medications - No data to display  The patient is not expressing any suicidal ideation or intent to me.  He denies trying to kill himself and states he heroin overdose was unintentional although he does admit he does  not care if the heroin kills him as he uses it for pleasure.  PROCEDURES  Procedures   ED DIAGNOSES  ICD-10-CM   1. Accidental overdose of heroin, initial encounter (HCC)  T40.1X1A        Paula Libra, MD 09/04/20 2346

## 2020-09-04 NOTE — ED Triage Notes (Signed)
Patient brought in by [facility] with [employer] due to jail will not take him till he is medically clear.

## 2020-09-04 NOTE — ED Notes (Signed)
Pt continuous to ambulate bath and forth to the triage restroom. Pt was in the bathroom for about 45 minutes just standing over the toilet not urinating. Pt instructed to go back in room until he is able to pee because he can not hold up the bathroom.

## 2020-09-04 NOTE — ED Notes (Signed)
Patient is being rude to staff because we dont have any sandwiches.

## 2021-01-12 DIAGNOSIS — Z733 Stress, not elsewhere classified: Secondary | ICD-10-CM | POA: Insufficient documentation

## 2021-01-12 DIAGNOSIS — Z5900 Homelessness unspecified: Secondary | ICD-10-CM | POA: Insufficient documentation

## 2021-01-12 DIAGNOSIS — Z20822 Contact with and (suspected) exposure to covid-19: Secondary | ICD-10-CM | POA: Insufficient documentation

## 2021-01-12 DIAGNOSIS — F1414 Cocaine abuse with cocaine-induced mood disorder: Secondary | ICD-10-CM | POA: Insufficient documentation

## 2021-01-12 DIAGNOSIS — F1014 Alcohol abuse with alcohol-induced mood disorder: Secondary | ICD-10-CM | POA: Insufficient documentation

## 2021-01-12 DIAGNOSIS — Z652 Problems related to release from prison: Secondary | ICD-10-CM | POA: Insufficient documentation

## 2021-01-12 DIAGNOSIS — Z9114 Patient's other noncompliance with medication regimen: Secondary | ICD-10-CM | POA: Insufficient documentation

## 2021-01-12 DIAGNOSIS — R45851 Suicidal ideations: Secondary | ICD-10-CM | POA: Insufficient documentation

## 2021-01-12 DIAGNOSIS — F17210 Nicotine dependence, cigarettes, uncomplicated: Secondary | ICD-10-CM | POA: Insufficient documentation

## 2021-01-12 DIAGNOSIS — F1994 Other psychoactive substance use, unspecified with psychoactive substance-induced mood disorder: Secondary | ICD-10-CM | POA: Insufficient documentation

## 2021-01-13 ENCOUNTER — Telehealth (HOSPITAL_COMMUNITY): Payer: Self-pay | Admitting: Internal Medicine

## 2021-01-13 ENCOUNTER — Inpatient Hospital Stay (HOSPITAL_COMMUNITY)
Admission: AD | Admit: 2021-01-13 | Discharge: 2021-01-16 | DRG: 885 | Disposition: A | Discharge status: Home / Self Care | Payer: Medicaid Other | Source: Intra-hospital | Attending: Psychiatry | Admitting: Psychiatry

## 2021-01-13 ENCOUNTER — Other Ambulatory Visit: Payer: Self-pay

## 2021-01-13 ENCOUNTER — Other Ambulatory Visit: Payer: Self-pay | Admitting: Psychiatry

## 2021-01-13 ENCOUNTER — Ambulatory Visit (HOSPITAL_COMMUNITY)
Admission: EM | Admit: 2021-01-13 | Discharge: 2021-01-13 | Disposition: A | Discharge status: Other Acute Inpatient Hospital | Payer: Medicaid Other | Attending: Nurse Practitioner | Admitting: Nurse Practitioner

## 2021-01-13 ENCOUNTER — Encounter (HOSPITAL_COMMUNITY): Payer: Self-pay

## 2021-01-13 ENCOUNTER — Encounter (HOSPITAL_COMMUNITY): Payer: Self-pay | Admitting: Psychiatry

## 2021-01-13 DIAGNOSIS — Z818 Family history of other mental and behavioral disorders: Secondary | ICD-10-CM | POA: Diagnosis not present

## 2021-01-13 DIAGNOSIS — F329 Major depressive disorder, single episode, unspecified: Secondary | ICD-10-CM | POA: Diagnosis present

## 2021-01-13 DIAGNOSIS — F17210 Nicotine dependence, cigarettes, uncomplicated: Secondary | ICD-10-CM | POA: Diagnosis present

## 2021-01-13 DIAGNOSIS — I10 Essential (primary) hypertension: Secondary | ICD-10-CM | POA: Diagnosis present

## 2021-01-13 DIAGNOSIS — Z8249 Family history of ischemic heart disease and other diseases of the circulatory system: Secondary | ICD-10-CM | POA: Diagnosis not present

## 2021-01-13 DIAGNOSIS — Z62810 Personal history of physical and sexual abuse in childhood: Secondary | ICD-10-CM | POA: Diagnosis present

## 2021-01-13 DIAGNOSIS — Z20822 Contact with and (suspected) exposure to covid-19: Secondary | ICD-10-CM | POA: Diagnosis present

## 2021-01-13 DIAGNOSIS — F1510 Other stimulant abuse, uncomplicated: Secondary | ICD-10-CM | POA: Diagnosis present

## 2021-01-13 DIAGNOSIS — R45851 Suicidal ideations: Secondary | ICD-10-CM | POA: Diagnosis present

## 2021-01-13 DIAGNOSIS — F1210 Cannabis abuse, uncomplicated: Secondary | ICD-10-CM | POA: Diagnosis present

## 2021-01-13 DIAGNOSIS — Z5900 Homelessness unspecified: Secondary | ICD-10-CM

## 2021-01-13 DIAGNOSIS — F1414 Cocaine abuse with cocaine-induced mood disorder: Secondary | ICD-10-CM | POA: Diagnosis not present

## 2021-01-13 DIAGNOSIS — F4310 Post-traumatic stress disorder, unspecified: Secondary | ICD-10-CM | POA: Diagnosis present

## 2021-01-13 DIAGNOSIS — G4700 Insomnia, unspecified: Secondary | ICD-10-CM | POA: Diagnosis present

## 2021-01-13 DIAGNOSIS — Z653 Problems related to other legal circumstances: Secondary | ICD-10-CM | POA: Diagnosis not present

## 2021-01-13 DIAGNOSIS — F1994 Other psychoactive substance use, unspecified with psychoactive substance-induced mood disorder: Secondary | ICD-10-CM

## 2021-01-13 DIAGNOSIS — F332 Major depressive disorder, recurrent severe without psychotic features: Principal | ICD-10-CM | POA: Diagnosis present

## 2021-01-13 DIAGNOSIS — F1410 Cocaine abuse, uncomplicated: Secondary | ICD-10-CM

## 2021-01-13 DIAGNOSIS — F1014 Alcohol abuse with alcohol-induced mood disorder: Secondary | ICD-10-CM | POA: Diagnosis not present

## 2021-01-13 DIAGNOSIS — F1010 Alcohol abuse, uncomplicated: Secondary | ICD-10-CM

## 2021-01-13 LAB — COMPREHENSIVE METABOLIC PANEL
ALT: 15 U/L (ref 0–44)
AST: 18 U/L (ref 15–41)
Albumin: 3.7 g/dL (ref 3.5–5.0)
Alkaline Phosphatase: 85 U/L (ref 38–126)
Anion gap: 12 (ref 5–15)
BUN: 13 mg/dL (ref 6–20)
CO2: 27 mmol/L (ref 22–32)
Calcium: 8.7 mg/dL — ABNORMAL LOW (ref 8.9–10.3)
Chloride: 102 mmol/L (ref 98–111)
Creatinine, Ser: 1 mg/dL (ref 0.61–1.24)
GFR, Estimated: >60 mL/min (ref >60)
Glucose, Bld: 92 mg/dL (ref 70–99)
Potassium: 3.8 mmol/L (ref 3.5–5.1)
Sodium: 141 mmol/L (ref 135–145)
Total Bilirubin: 0.4 mg/dL (ref 0.3–1.2)
Total Protein: 6.2 g/dL — ABNORMAL LOW (ref 6.5–8.1)

## 2021-01-13 LAB — POCT URINE DRUG SCREEN - MANUAL ENTRY (I-SCREEN)
POC Amphetamine UR: NOT DETECTED
POC Buprenorphine (BUP): NOT DETECTED
POC Cocaine UR: POSITIVE — AB
POC Marijuana UR: POSITIVE — AB
POC Methadone UR: NOT DETECTED
POC Methamphetamine UR: NOT DETECTED
POC Morphine: NOT DETECTED
POC Oxazepam (BZO): NOT DETECTED
POC Oxycodone UR: NOT DETECTED
POC Secobarbital (BAR): NOT DETECTED

## 2021-01-13 LAB — LIPID PANEL
Cholesterol: 160 mg/dL (ref 0–200)
HDL: 54 mg/dL (ref >40)
LDL Cholesterol: 70 mg/dL (ref 0–99)
Total CHOL/HDL Ratio: 3 RATIO
Triglycerides: 180 mg/dL — ABNORMAL HIGH (ref <150)
VLDL: 36 mg/dL (ref 0–40)

## 2021-01-13 LAB — CBC WITH DIFFERENTIAL/PLATELET
Abs Immature Granulocytes: 0.03 10*3/uL (ref 0.00–0.07)
Basophils Absolute: 0 10*3/uL (ref 0.0–0.1)
Basophils Relative: 0 %
Eosinophils Absolute: 0.1 10*3/uL (ref 0.0–0.5)
Eosinophils Relative: 1 %
HCT: 40.8 % (ref 39.0–52.0)
Hemoglobin: 13.5 g/dL (ref 13.0–17.0)
Immature Granulocytes: 0 %
Lymphocytes Relative: 36 %
Lymphs Abs: 3.3 10*3/uL (ref 0.7–4.0)
MCH: 29.1 pg (ref 26.0–34.0)
MCHC: 33.1 g/dL (ref 30.0–36.0)
MCV: 87.9 fL (ref 80.0–100.0)
Monocytes Absolute: 0.7 10*3/uL (ref 0.1–1.0)
Monocytes Relative: 8 %
Neutro Abs: 5 10*3/uL (ref 1.7–7.7)
Neutrophils Relative %: 55 %
Platelets: 280 10*3/uL (ref 150–400)
RBC: 4.64 MIL/uL (ref 4.22–5.81)
RDW: 13.5 % (ref 11.5–15.5)
WBC: 9.2 10*3/uL (ref 4.0–10.5)
nRBC: 0 % (ref 0.0–0.2)

## 2021-01-13 LAB — RESP PANEL BY RT-PCR (FLU A&B, COVID) ARPGX2
Influenza A by PCR: NEGATIVE
Influenza B by PCR: NEGATIVE
SARS Coronavirus 2 by RT PCR: NEGATIVE

## 2021-01-13 LAB — POC SARS CORONAVIRUS 2 AG -  ED: SARS Coronavirus 2 Ag: NEGATIVE

## 2021-01-13 LAB — ETHANOL: Alcohol, Ethyl (B): <10 mg/dL (ref <10)

## 2021-01-13 LAB — TSH: TSH: 1.417 u[IU]/mL (ref 0.350–4.500)

## 2021-01-13 LAB — HEMOGLOBIN A1C
Hgb A1c MFr Bld: 4.9 % (ref 4.8–5.6)
Mean Plasma Glucose: 93.93 mg/dL

## 2021-01-13 MED ORDER — PANTOPRAZOLE SODIUM 40 MG PO TBEC
40.0000 mg | DELAYED_RELEASE_TABLET | Freq: Every day | ORAL | Status: DC
  Administered 2021-01-13: 40 mg via ORAL
  Filled 2021-01-13: qty 1

## 2021-01-13 MED ORDER — TRAZODONE HCL 50 MG PO TABS
50.0000 mg | ORAL_TABLET | Freq: Every evening | ORAL | Status: DC | PRN
  Administered 2021-01-13: 50 mg via ORAL
  Filled 2021-01-13: qty 1

## 2021-01-13 MED ORDER — MAGNESIUM HYDROXIDE 400 MG/5ML PO SUSP: 30.0000 mL | Freq: Every day | ORAL | Status: DC | PRN

## 2021-01-13 MED ORDER — HYDROXYZINE HCL 25 MG PO TABS: 25.0000 mg | ORAL_TABLET | Freq: Three times a day (TID) | ORAL | Status: DC | PRN

## 2021-01-13 MED ORDER — MIRTAZAPINE 15 MG PO TABS
15.0000 mg | ORAL_TABLET | Freq: Every day | ORAL | Status: DC
  Administered 2021-01-13: 15 mg via ORAL
  Filled 2021-01-13: qty 1

## 2021-01-13 MED ORDER — ARIPIPRAZOLE 10 MG PO TABS
10.0000 mg | ORAL_TABLET | Freq: Every day | ORAL | Status: DC
  Administered 2021-01-13: 10 mg via ORAL
  Filled 2021-01-13: qty 1

## 2021-01-13 MED ORDER — TRAZODONE HCL 50 MG PO TABS: 50.0000 mg | ORAL_TABLET | Freq: Every evening | ORAL | Status: DC | PRN

## 2021-01-13 MED ORDER — LISINOPRIL 10 MG PO TABS
10.0000 mg | ORAL_TABLET | Freq: Every day | ORAL | Status: DC
  Administered 2021-01-13: 10 mg via ORAL
  Filled 2021-01-13: qty 1

## 2021-01-13 MED ORDER — ACETAMINOPHEN 325 MG PO TABS: 650.0000 mg | ORAL_TABLET | Freq: Four times a day (QID) | ORAL | Status: DC | PRN

## 2021-01-13 MED ORDER — MAGNESIUM HYDROXIDE 400 MG/5ML PO SUSP: 15.0000 mL | Freq: Every day | ORAL | Status: DC | PRN

## 2021-01-13 MED ORDER — ALUM & MAG HYDROXIDE-SIMETH 200-200-20 MG/5ML PO SUSP: 30.0000 mL | ORAL | Status: DC | PRN

## 2021-01-13 MED ORDER — BUPROPION HCL ER (XL) 150 MG PO TB24
150.0000 mg | ORAL_TABLET | Freq: Every day | ORAL | Status: DC
  Administered 2021-01-13: 150 mg via ORAL
  Filled 2021-01-13: qty 1

## 2021-01-13 MED ORDER — ARIPIPRAZOLE 5 MG PO TABS
5.0000 mg | ORAL_TABLET | Freq: Once | ORAL | Status: AC
  Administered 2021-01-13: 5 mg via ORAL
  Filled 2021-01-13: qty 1

## 2021-01-13 MED ORDER — PRAZOSIN HCL 2 MG PO CAPS
2.0000 mg | ORAL_CAPSULE | Freq: Every day | ORAL | Status: DC
  Administered 2021-01-13: 2 mg via ORAL
  Filled 2021-01-13: qty 1
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
## 2021-01-13 NOTE — Tx Team (Signed)
Initial Treatment Plan 01/13/2021 11:06 PM Chase Moss [id number]    PATIENT STRESSORS: Legal issue Loss of home Medication change or noncompliance   PATIENT STRENGTHS: General fund of knowledge Motivation for treatment/growth   PATIENT IDENTIFIED PROBLEMS: Risk for suicide  depression  homeless  "regulate medications, somewhere to go on discharge"               DISCHARGE CRITERIA:  Improved stabilization in mood, thinking, and/or behavior Verbal commitment to aftercare and medication compliance  PRELIMINARY DISCHARGE PLAN: Attend aftercare/continuing care group Attend PHP/IOP Outpatient therapy  PATIENT/FAMILY INVOLVEMENT: This treatment plan has been presented to and reviewed with the patient, Chase Moss.  The patient and family have been given the opportunity to ask questions and make suggestions.  Delos Haring, RN 01/13/2021, 11:06 PM

## 2021-01-13 NOTE — ED Triage Notes (Signed)
Patient reports homelessness and being off meds since being incarcerated in [place] until last 2023-04-27. Reports sister died last year and he was paying the bills at her home but his brother in law sold the house without telling him. Patient reports SI via fentanyl OD and HI toward BIL but that he doesn't think he would ever act on the HI. Reports recent OD and brought back with 2 doses of fentanyl. Reports no reason to live but no active desire to die. Patient in no acute distress at this time. Working with [facility] to get reestablished.

## 2021-01-13 NOTE — ED Notes (Signed)
Pt given sandwich, chips, cookies and cola.
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
## 2021-01-13 NOTE — Discharge Instructions (Addendum)
Patient to be transferred to [facility] for inpatient psychiatric treatment.  

## 2021-01-13 NOTE — ED Notes (Signed)
Attempted to call report to [facility]. Given separate number to RN Kathlene November but no one answered phone and then no one answered main unit number when attempted to clarify number for RN.

## 2021-01-13 NOTE — ED Notes (Signed)
Pt given meal

## 2021-01-13 NOTE — ED Provider Notes (Signed)
Behavioral Health Progress Note  Date and Time: 01/13/2021 3:14 PM Name: Chase Moss MRN:  [id number]  Subjective:  This provider approached pt as he was sleeping in his bed and woke him up. Pt was asked if he would like to be interviewed in a private room; pt declined,stating he was too tired to get up and he was fine talking there. Pt remained with his eyes closed during interview, but answered questions readily, with clear speech. Pt continues to endorse suicidal ideation, with plan to overdose on heroin. Pt denies homicidal ideation towards his brother-in-law or anyone else at this time, indicating that he only felt angry with brother-in-law and may have mispoke because of the circumstances of pt's sister's trailer being sold. Pt denies desire to hurt his brother-in-law in any way. Pt refers to provider notes of presentation to [facility], stating that he was released from jail on 01/06/21, after being incarcerated for 123 days for obtaining property under false pretenses.    Upon questioning pt regarding his suicidal ideation pt states that he has intent to overdose because he thought the trailer would be available to him to live in, and now he has nowhere to go. When this provider offered community shelters, pt stated that homelessness was only one of his problems, and his "maiin" problem" is that "They only gave me one of my meds in jail and I need to be hospitalized to get stable on my meds stop having crazy thoughts."    Pt says he made contact with "[facility]" on 01/12/21, where he has had ACTT team in the past. He states they are trying to help him get housing and re-establish with them.  Plan is to admit pt to [facility] for stabilization and treatment. Per admissions coordinator pt has been accepted to [id number] and transportation has been arranged for 2000 today. Diagnosis:  Final diagnoses:  Substance induced mood disorder (HCC)  Alcohol abuse  Cocaine abuse (HCC)    Total Time spent with  patient: 30 minutes  Past Psychiatric History: MDD, Bipolar disorder, substance use disorder, substance induced mood disorder   Past Medical History:  Past Medical History:  Diagnosis Date  . Bipolar 1 disorder (HCC)   . Depression   . Hypertension   . PTSD (post-traumatic stress disorder)    History reviewed. No pertinent surgical history. Family History:  Family History  Problem Relation Age of Onset  . Heart attack Other    Family Psychiatric  History: None reported Social History:  Social History   Substance and Sexual Activity  Alcohol Use Yes   Comment: 6-8 beers/day for last week     Social History   Substance and Sexual Activity  Drug Use Yes  . Types: Marijuana, Methamphetamines, Cocaine   Comment: coc- late last night "quite a bit"    Social History   Socioeconomic History  . Marital status: Divorced    Spouse name: Not on file  . Number of children: Not on file  . Years of education: Not on file  . Highest education level: Not on file  Occupational History  . Not on file  Tobacco Use  . Smoking status: Current Every Day Smoker    Packs/day: 0.25    Years: 30.00    Pack years: 7.50    Types: Cigarettes  . Smokeless tobacco: Never Used  Vaping Use  . Vaping Use: Never used  Substance and Sexual Activity  . Alcohol use: Yes    Comment: 6-8 beers/day  for last week  . Drug use: Yes    Types: Marijuana, Methamphetamines, Cocaine    Comment: coc- late last night "quite a bit"  . Sexual activity: Yes  Other Topics Concern  . Not on file  Social History Narrative  . Not on file   Social Determinants of Health   Financial Resource Strain: Not on file  Food Insecurity: Not on file  Transportation Needs: Not on file  Physical Activity: Not on file  Stress: Not on file  Social Connections: Not on file   SDOH:  SDOH Screenings   Alcohol Screen: Low Risk   . Last Alcohol Screening Score (AUDIT): 0  Depression (PHQ2-9): Medium Risk  . PHQ-2  Score: 25  Financial Resource Strain: Not on file  Food Insecurity: Not on file  Housing: Not on file  Physical Activity: Not on file  Social Connections: Not on file  Stress: Not on file  Tobacco Use: High Risk  . Smoking Tobacco Use: Current Every Day Smoker  . Smokeless Tobacco Use: Never Used  Transportation Needs: Not on file   Additional Social History:    Pain Medications: Please see MAR Prescriptions: Please see MAR Over the Counter: Please see MAR History of alcohol / drug use?: Yes Longest period of sobriety (when/how long): Unknown Negative Consequences of Use: Financial,Legal,Personal relationships Name of Substance 1: Cocaine 1 - Age of First Use: 11 1 - Amount (size/oz): 1 gram 1 - Frequency: Several times/week 1 - Duration: Unknown 1 - Last Use / Amount: 01/12/2021 Name of Substance 2: EtOH 2 - Age of First Use: 7 2 - Amount (size/oz): "Until gets tired of drinking" 2 - Frequency: Typically doesn't engage in EtOH use 2 - Duration: Unknown 2 - Last Use / Amount: 01/12/2021 Name of Substance 3: Marijuana 3 - Age of First Use: 6 3 - Amount (size/oz): 2 grams 3 - Frequency: Daily 3 - Duration: Unknown 3 - Last Use / Amount: 01/13/2021              Sleep: Poor  Appetite:  Good  Current Medications:  Current Facility-Administered Medications  Medication Dose Route Frequency Provider Last Rate Last Admin  . acetaminophen (TYLENOL) tablet 650 mg  650 mg Oral Q6H PRN Nira Conn A, NP      . alum & mag hydroxide-simeth (MAALOX/MYLANTA) 200-200-20 MG/5ML suspension 30 mL  30 mL Oral Q4H PRN Nira Conn A, NP      . ARIPiprazole (ABILIFY) tablet 10 mg  10 mg Oral Daily Nira Conn A, NP   10 mg at 01/13/21 0908  . buPROPion (WELLBUTRIN XL) 24 hr tablet 150 mg  150 mg Oral Daily Nira Conn A, NP   150 mg at 01/13/21 0908  . hydrOXYzine (ATARAX/VISTARIL) tablet 25 mg  25 mg Oral TID PRN Nira Conn A, NP      . lisinopril (ZESTRIL) tablet 10 mg  10 mg  Oral Daily Nira Conn A, NP   10 mg at 01/13/21 0908  . magnesium hydroxide (MILK OF MAGNESIA) suspension 30 mL  30 mL Oral Daily PRN Nira Conn A, NP      . mirtazapine (REMERON) tablet 15 mg  15 mg Oral QHS Nira Conn A, NP   15 mg at 01/13/21 0200  . pantoprazole (PROTONIX) EC tablet 40 mg  40 mg Oral Daily Nira Conn A, NP   40 mg at 01/13/21 1191  . prazosin (MINIPRESS) capsule 2 mg  2 mg Oral QHS Jackelyn Poling,  NP   2 mg at 01/13/21 0201  . traZODone (DESYREL) tablet 50 mg  50 mg Oral QHS PRN Jackelyn Poling, NP   50 mg at 01/13/21 0203   Current Outpatient Medications  Medication Sig Dispense Refill  . lisinopril (ZESTRIL) 10 MG tablet Take 1 tablet (10 mg total) by mouth daily. 30 tablet 0    Labs  Lab Results:  Admission on 01/13/2021  Component Date Value Ref Range Status  . SARS Coronavirus 2 by RT PCR 01/13/2021 NEGATIVE  NEGATIVE Final   Comment: (NOTE) SARS-CoV-2 target nucleic acids are NOT DETECTED.  The SARS-CoV-2 RNA is generally detectable in upper respiratory specimens during the acute phase of infection. The lowest concentration of SARS-CoV-2 viral copies this assay can detect is 138 copies/mL. A negative result does not preclude SARS-Cov-2 infection and should not be used as the sole basis for treatment or other patient management decisions. A negative result may occur with  improper specimen collection/handling, submission of specimen other than nasopharyngeal swab, presence of viral mutation(s) within the areas targeted by this assay, and inadequate number of viral copies(<138 copies/mL). A negative result must be combined with clinical observations, patient history, and epidemiological information. The expected result is Negative.  Fact Sheet for Patients:  BloggerCourse.com  Fact Sheet for Healthcare Providers:  SeriousBroker.it  This test is no                          t yet approved or cleared  by the [place] FDA and  has been authorized for detection and/or diagnosis of SARS-CoV-2 by FDA under an Emergency Use Authorization (EUA). This EUA will remain  in effect (meaning this test can be used) for the duration of the COVID-19 declaration under Section 564(b)(1) of the Act, 21 U.S.C.section 360bbb-3(b)(1), unless the authorization is terminated  or revoked sooner.      . Influenza A by PCR 01/13/2021 NEGATIVE  NEGATIVE Final  . Influenza B by PCR 01/13/2021 NEGATIVE  NEGATIVE Final   Comment: (NOTE) The Xpert Xpress SARS-CoV-2/FLU/RSV plus assay is intended as an aid in the diagnosis of influenza from Nasopharyngeal swab specimens and should not be used as a sole basis for treatment. Nasal washings and aspirates are unacceptable for Xpert Xpress SARS-CoV-2/FLU/RSV testing.  Fact Sheet for Patients: BloggerCourse.com  Fact Sheet for Healthcare Providers: SeriousBroker.it  This test is not yet approved or cleared by the [place] FDA and has been authorized for detection and/or diagnosis of SARS-CoV-2 by FDA under an Emergency Use Authorization (EUA). This EUA will remain in effect (meaning this test can be used) for the duration of the COVID-19 declaration under Section 564(b)(1) of the Act, 21 U.S.C. section 360bbb-3(b)(1), unless the authorization is terminated or revoked.  Performed at [facility], [address], [place], [place] [address]   . SARS Coronavirus 2 Ag 01/13/2021 Negative  Negative Preliminary  . WBC 01/13/2021 9.2  4.0 - 10.5 K/uL Final  . RBC 01/13/2021 4.64  4.22 - 5.81 MIL/uL Final  . Hemoglobin 01/13/2021 13.5  13.0 - 17.0 g/dL Final  . HCT 60/45/4098 40.8  39.0 - 52.0 % Final  . MCV 01/13/2021 87.9  80.0 - 100.0 fL Final  . MCH 01/13/2021 29.1  26.0 - 34.0 pg Final  . MCHC 01/13/2021 33.1  30.0 - 36.0 g/dL Final  . RDW 11/91/4782 13.5  11.5 - 15.5 % Final  . Platelets  01/13/2021 280  150 -  400 K/uL Final  . nRBC 01/13/2021 0.0  0.0 - 0.2 % Final  . Neutrophils Relative % 01/13/2021 55  % Final  . Neutro Abs 01/13/2021 5.0  1.7 - 7.7 K/uL Final  . Lymphocytes Relative 01/13/2021 36  % Final  . Lymphs Abs 01/13/2021 3.3  0.7 - 4.0 K/uL Final  . Monocytes Relative 01/13/2021 8  % Final  . Monocytes Absolute 01/13/2021 0.7  0.1 - 1.0 K/uL Final  . Eosinophils Relative 01/13/2021 1  % Final  . Eosinophils Absolute 01/13/2021 0.1  0.0 - 0.5 K/uL Final  . Basophils Relative 01/13/2021 0  % Final  . Basophils Absolute 01/13/2021 0.0  0.0 - 0.1 K/uL Final  . Immature Granulocytes 01/13/2021 0  % Final  . Abs Immature Granulocytes 01/13/2021 0.03  0.00 - 0.07 K/uL Final   Performed at [facility], [address] [place], [place] [address]  . Sodium 01/13/2021 141  135 - 145 mmol/L Final  . Potassium 01/13/2021 3.8  3.5 - 5.1 mmol/L Final  . Chloride 01/13/2021 102  98 - 111 mmol/L Final  . CO2 01/13/2021 27  22 - 32 mmol/L Final  . Glucose, Bld 01/13/2021 92  70 - 99 mg/dL Final   Glucose reference range applies only to samples taken after fasting for at least 8 hours.  . BUN 01/13/2021 13  6 - 20 mg/dL Final  . Creatinine, Ser 01/13/2021 1.00  0.61 - 1.24 mg/dL Final  . Calcium 53/74/8270 8.7* 8.9 - 10.3 mg/dL Final  . Total Protein 01/13/2021 6.2* 6.5 - 8.1 g/dL Final  . Albumin 78/67/5449 3.7  3.5 - 5.0 g/dL Final  . AST 20/09/711 18  15 - 41 U/L Final  . ALT 01/13/2021 15  0 - 44 U/L Final  . Alkaline Phosphatase 01/13/2021 85  38 - 126 U/L Final  . Total Bilirubin 01/13/2021 0.4  0.3 - 1.2 mg/dL Final  . GFR, Estimated 01/13/2021 >60  >60 mL/min Final   Comment: (NOTE) Calculated using the CKD-EPI Creatinine Equation (2021)   . Anion gap 01/13/2021 12  5 - 15 Final   Performed at [facility], [address], [place], [place] [address]  . Hgb A1c MFr Bld 01/13/2021 4.9  4.8 - 5.6 % Final   Comment: (NOTE) Pre diabetes:           5.7%-6.4%  Diabetes:              >6.4%  Glycemic control for   <7.0% adults with diabetes   . Mean Plasma Glucose 01/13/2021 93.93  mg/dL Final   Performed at [facility], [address], [name], [place] [address]  . Cholesterol 01/13/2021 160  0 - 200 mg/dL Final  . Triglycerides 01/13/2021 180* <150 mg/dL Final  . HDL 98/26/4158 54  >40 mg/dL Final  . Total CHOL/HDL Ratio 01/13/2021 3.0  RATIO Final  . VLDL 01/13/2021 36  0 - 40 mg/dL Final  . LDL Cholesterol 01/13/2021 70  0 - 99 mg/dL Final   Comment:        Total Cholesterol/HDL:CHD Risk Coronary Heart Disease Risk Table                     Men   Women  1/2 Average Risk   3.4   3.3  Average Risk       5.0   4.4  2 X Average Risk   9.6   7.1  3 X Average Risk  23.4   11.0        Use the calculated Patient Ratio above and the CHD Risk Table to determine the patient's CHD Risk.        ATP III CLASSIFICATION (LDL):  <100     mg/dL   Optimal  [id number]  mg/dL   Near or Above                    Optimal  130-159  mg/dL   Borderline  [id number]  mg/dL   High  >811>190     mg/dL   Very High Performed at [facility], [address] [place], [place] [address]   . Alcohol, Ethyl (B) 01/13/2021 <10  <10 mg/dL Final   Comment: (NOTE) Lowest detectable limit for serum alcohol is 10 mg/dL.  For medical purposes only. Performed at [facility], [address] [place], [place] [address]   . TSH 01/13/2021 1.417  0.350 - 4.500 uIU/mL Final   Comment: Performed by a 3rd Generation assay with a functional sensitivity of <=0.01 uIU/mL. Performed at [facility], [address] [place], [place] [address]   . POC Amphetamine UR 01/13/2021 None Detected  NONE DETECTED (Cut Off Level 1000 ng/mL) Final  . POC Secobarbital (BAR) 01/13/2021 None Detected  NONE DETECTED (Cut Off Level 300 ng/mL) Final  . POC Buprenorphine (BUP) 01/13/2021 None Detected  NONE DETECTED (Cut Off Level 10 ng/mL) Final  . POC Oxazepam  (BZO) 01/13/2021 None Detected  NONE DETECTED (Cut Off Level 300 ng/mL) Final  . POC Cocaine UR 01/13/2021 Positive* NONE DETECTED (Cut Off Level 300 ng/mL) Final  . POC Methamphetamine UR 01/13/2021 None Detected  NONE DETECTED (Cut Off Level 1000 ng/mL) Final  . POC Morphine 01/13/2021 None Detected  NONE DETECTED (Cut Off Level 300 ng/mL) Final  . POC Oxycodone UR 01/13/2021 None Detected  NONE DETECTED (Cut Off Level 100 ng/mL) Final  . POC Methadone UR 01/13/2021 None Detected  NONE DETECTED (Cut Off Level 300 ng/mL) Final  . POC Marijuana UR 01/13/2021 Positive* NONE DETECTED (Cut Off Level 50 ng/mL) Final  Admission on 09/04/2020, Discharged on 09/05/2020  Component Date Value Ref Range Status  . Sodium 09/04/2020 143  135 - 145 mmol/L Final  . Potassium 09/04/2020 4.1  3.5 - 5.1 mmol/L Final  . Chloride 09/04/2020 103  98 - 111 mmol/L Final  . CO2 09/04/2020 31  22 - 32 mmol/L Final  . Glucose, Bld 09/04/2020 85  70 - 99 mg/dL Final   Glucose reference range applies only to samples taken after fasting for at least 8 hours.  . BUN 09/04/2020 22* 6 - 20 mg/dL Final  . Creatinine, Ser 09/04/2020 1.35* 0.61 - 1.24 mg/dL Final  . Calcium 57/84/696209/11/2020 9.3  8.9 - 10.3 mg/dL Final  . Total Protein 09/04/2020 7.6  6.5 - 8.1 g/dL Final  . Albumin 95/28/413209/11/2020 4.5  3.5 - 5.0 g/dL Final  . AST 44/01/027209/11/2020 19  15 - 41 U/L Final  . ALT 09/04/2020 16  0 - 44 U/L Final  . Alkaline Phosphatase 09/04/2020 86  38 - 126 U/L Final  . Total Bilirubin 09/04/2020 0.5  0.3 - 1.2 mg/dL Final  . GFR calc non Af Amer 09/04/2020 58* >60 mL/min Final  . GFR calc Af Amer 09/04/2020 >60  >60 mL/min Final  . Anion gap 09/04/2020 9  5 - 15 Final   Performed at [facility] [place] [facility], [address], [place], [place] [address]  . Alcohol, Ethyl (B) 09/04/2020 <10  <10 mg/dL Final   Comment: (NOTE) Lowest detectable limit for serum alcohol is 10 mg/dL.  For medical purposes only. Performed at [facility], [address], [place], [place] [address]   . Salicylate Lvl 09/04/2020 <7.0* 7.0 - 30.0 mg/dL Final   Performed at [facility], [address], [place], [place] [address]  . Acetaminophen (Tylenol), Serum 09/04/2020 <10* 10 - 30 ug/mL Final   Comment: (NOTE) Therapeutic concentrations vary significantly. A range of 10-30 ug/mL  may be an effective concentration for many patients. However, some  are best treated at concentrations outside of this range. Acetaminophen concentrations >150 ug/mL at 4 hours after ingestion  and >50 ug/mL at 12 hours after ingestion are often associated with  toxic reactions.  Performed at [facility], [address] [place], [place] [address]   . WBC 09/04/2020 9.8  4.0 - 10.5 K/uL Final  . RBC 09/04/2020 5.22  4.22 - 5.81 MIL/uL Final  . Hemoglobin 09/04/2020 15.5  13.0 - 17.0 g/dL Final  . HCT 29/56/2130 47.2  39.0 - 52.0 % Final  . MCV 09/04/2020 90.4  80.0 - 100.0 fL Final  . MCH 09/04/2020 29.7  26.0 - 34.0 pg Final  . MCHC 09/04/2020 32.8  30.0 - 36.0 g/dL Final  . RDW 86/57/8469 13.5  11.5 - 15.5 % Final  . Platelets 09/04/2020 258  150 - 400 K/uL Final  . nRBC 09/04/2020 0.0  0.0 - 0.2 % Final   Performed at [facility], [address], [place], [place] [address]  Admission on 07/28/2020, Discharged on 08/02/2020  Component Date Value Ref Range Status  . Sodium 07/31/2020 138  135 - 145 mmol/L Final  . Potassium 07/31/2020 3.7  3.5 - 5.1 mmol/L Final  . Chloride 07/31/2020 101  98 - 111 mmol/L Final  . CO2 07/31/2020 28  22 - 32 mmol/L Final  . Glucose, Bld 07/31/2020 99  70 - 99 mg/dL Final   Glucose reference range applies only to samples taken after fasting for at least 8 hours.  . BUN 07/31/2020 14  6 - 20 mg/dL Final  . Creatinine, Ser 07/31/2020 0.98  0.61 - 1.24 mg/dL Final  . Calcium 84/13/2440 8.0* 8.9 - 10.3 mg/dL Final  . GFR calc non Af Amer  07/31/2020 >60  >60 mL/min Final  . GFR calc Af Amer 07/31/2020 >60  >60 mL/min Final  . Anion gap 07/31/2020 9  5 - 15 Final   Performed at [facility], [address], [place], [place] [address]  Admission on 07/28/2020, Discharged on 07/28/2020  Component Date Value Ref Range Status  . WBC 07/28/2020 7.0  4.0 - 10.5 K/uL Final  . RBC 07/28/2020 4.41  4.22 - 5.81 MIL/uL Final  . Hemoglobin 07/28/2020 13.1  13.0 - 17.0 g/dL Final  . HCT 53/66/4403 39.3  39.0 - 52.0 % Final  . MCV 07/28/2020 89.1  80.0 - 100.0 fL Final  . MCH 07/28/2020 29.7  26.0 - 34.0 pg Final  . MCHC 07/28/2020 33.3  30.0 - 36.0 g/dL Final  . RDW 47/42/5956 13.9  11.5 - 15.5 % Final  . Platelets 07/28/2020 227  150 - 400 K/uL Final  . nRBC 07/28/2020 0.0  0.0 - 0.2 % Final  . Neutrophils Relative % 07/28/2020 57  % Final  . Neutro  Abs 07/28/2020 4.0  1.7 - 7.7 K/uL Final  . Lymphocytes Relative 07/28/2020 34  % Final  . Lymphs Abs 07/28/2020 2.3  0.7 - 4.0 K/uL Final  . Monocytes Relative 07/28/2020 8  % Final  . Monocytes Absolute 07/28/2020 0.5  0.1 - 1.0 K/uL Final  . Eosinophils Relative 07/28/2020 1  % Final  . Eosinophils Absolute 07/28/2020 0.1  0.0 - 0.5 K/uL Final  . Basophils Relative 07/28/2020 0  % Final  . Basophils Absolute 07/28/2020 0.0  0.0 - 0.1 K/uL Final  . Immature Granulocytes 07/28/2020 0  % Final  . Abs Immature Granulocytes 07/28/2020 0.03  0.00 - 0.07 K/uL Final   Performed at [facility], [address] [place], [place] [address]  . Sodium 07/28/2020 137  135 - 145 mmol/L Final  . Potassium 07/28/2020 2.7* 3.5 - 5.1 mmol/L Final   Comment: CRITICAL RESULT CALLED TO, READ BACK BY AND VERIFIED WITH: T.SMITH AT 1635 ON 07/28/20 BY N.THOMPSON   . Chloride 07/28/2020 100  98 - 111 mmol/L Final  . CO2 07/28/2020 28  22 - 32 mmol/L Final  . Glucose, Bld 07/28/2020 106* 70 - 99 mg/dL Final   Glucose reference range applies only to samples taken after  fasting for at least 8 hours.  . BUN 07/28/2020 10  6 - 20 mg/dL Final  . Creatinine, Ser 07/28/2020 0.92  0.61 - 1.24 mg/dL Final  . Calcium 60/45/409808/04/2020 8.0* 8.9 - 10.3 mg/dL Final  . GFR calc non Af Amer 07/28/2020 >60  >60 mL/min Final  . GFR calc Af Amer 07/28/2020 >60  >60 mL/min Final  . Anion gap 07/28/2020 9  5 - 15 Final   Performed at [facility], [address] [place], [place] [address]  . Alcohol, Ethyl (B) 07/28/2020 <10  <10 mg/dL Final   Comment: (NOTE) Lowest detectable limit for serum alcohol is 10 mg/dL.  For medical purposes only. Performed at [facility], [address] [place], [place] [address]   . Opiates 07/28/2020 NONE DETECTED  NONE DETECTED Final  . Cocaine 07/28/2020 POSITIVE* NONE DETECTED Final  . Benzodiazepines 07/28/2020 NONE DETECTED  NONE DETECTED Final  . Amphetamines 07/28/2020 NONE DETECTED  NONE DETECTED Final  . Tetrahydrocannabinol 07/28/2020 POSITIVE* NONE DETECTED Final  . Barbiturates 07/28/2020 NONE DETECTED  NONE DETECTED Final   Comment: (NOTE) DRUG SCREEN FOR MEDICAL PURPOSES ONLY.  IF CONFIRMATION IS NEEDED FOR ANY PURPOSE, NOTIFY LAB WITHIN 5 DAYS.  LOWEST DETECTABLE LIMITS FOR URINE DRUG SCREEN Drug Class                     Cutoff (ng/mL) Amphetamine and metabolites    1000 Barbiturate and metabolites    200 Benzodiazepine                 200 Tricyclics and metabolites     300 Opiates and metabolites        300 Cocaine and metabolites        300 THC                            50 Performed at [facility], [address] [place], [place] [address]   . SARS Coronavirus 2 07/28/2020 NEGATIVE  NEGATIVE Final   Comment: (NOTE) SARS-CoV-2 target nucleic acids are NOT DETECTED.  The SARS-CoV-2 RNA is generally detectable in upper and lower respiratory specimens  during the acute phase of infection. The lowest concentration of SARS-CoV-2 viral copies this assay can  detect is 250 copies / mL. A negative result does not preclude SARS-CoV-2 infection and should not be used as the sole basis for treatment or other patient management decisions.  A negative result may occur with improper specimen collection / handling, submission of specimen other than nasopharyngeal swab, presence of viral mutation(s) within the areas targeted by this assay, and inadequate number of viral copies (<250 copies / mL). A negative result must be combined with clinical observations, patient history, and epidemiological information.  Fact Sheet for Patients:   BoilerBrush.com.cy  Fact Sheet for Healthcare Providers: https://pope.com/  This test is not yet approved or                           cleared by the [place] FDA and has been authorized for detection and/or diagnosis of SARS-CoV-2 by FDA under an Emergency Use Authorization (EUA).  This EUA will remain in effect (meaning this test can be used) for the duration of the COVID-19 declaration under Section 564(b)(1) of the Act, 21 U.S.C. section 360bbb-3(b)(1), unless the authorization is terminated or revoked sooner.  Performed at [facility], [address] [place], [place] [address]   . TSH 07/28/2020 0.225* 0.350 - 4.500 uIU/mL Final   Comment: Performed by a 3rd Generation assay with a functional sensitivity of <=0.01 uIU/mL. Performed at [facility], [address], [place], [place] [address]   . Free T4 07/28/2020 0.98  0.61 - 1.12 ng/dL Final   Comment: (NOTE) Biotin ingestion may interfere with free T4 tests. If the results are inconsistent with the TSH level, previous test results, or the clinical presentation, then consider biotin interference. If needed, order repeat testing after stopping biotin. Performed at [facility], [address] [place], [place] [address]     Blood Alcohol level:  Lab  Results  Component Value Date   [facility] <10 01/13/2021   ETH <10 09/04/2020    Metabolic Disorder Labs: Lab Results  Component Value Date   HGBA1C 4.9 01/13/2021   MPG 93.93 01/13/2021   MPG 108.28 07/16/2018   Lab Results  Component Value Date   PROLACTIN 21.0 (H) 06/21/2016   Lab Results  Component Value Date   CHOL 160 01/13/2021   TRIG 180 (H) 01/13/2021   HDL 54 01/13/2021   CHOLHDL 3.0 01/13/2021   VLDL 36 01/13/2021   LDLCALC 70 01/13/2021   LDLCALC 80 06/14/2020    Therapeutic Lab Levels: No results found for: LITHIUM No results found for: VALPROATE No components found for:  CBMZ  Physical Findings   AIMS   Flowsheet Row [facility]) [facility] Ad[facility] (Discharged) [facility] Admission (Discharged) [facility] Admission (Discharged) [facility] Admission (Discharged) [facility]  AIMS Total Score 0 0 0 0 0    AUDIT   Flowsheet Row [facility]) [facility] Ad[facility] (Discharged) [facility] Admission (Discharged) [facility] Admission (Discharged) [facility] Admission (Discharged) [facility]  Alcohol Use Disorder Identification Test Final Score (AUDIT) 0 0 1 9 13     PHQ2-9   Flowsheet Row ED from 01/13/2021 in [facility] ED from 06/14/2020 in [facility]  PHQ-2 Total Score 6 6  PHQ-9 Total Score 25 27       Musculoskeletal  Strength & Muscle Tone: within normal  limits Gait & Station: not observed Patient leans: N/A  Psychiatric Specialty Exam  Presentation  General Appearance: Disheveled  Eye Contact:Absent (Pt states he hasn't slept and kept eyes closed during interview)  Speech:Clear and Coherent  Speech Volume:Normal  Handedness:Right   Mood and Affect  Mood:Depressed; Hopeless  Affect:Blunt   Thought Process  Thought Processes:Coherent  Descriptions of Associations:Intact  Orientation:Full (Time, Place and Person)  Thought Content:WDL  Hallucinations:Hallucinations: None  Ideas of Reference:None  Suicidal Thoughts:Suicidal Thoughts: Yes, Active SI Active Intent and/or Plan: With Intent; With Access to Means; With Plan  Homicidal Thoughts:Homicidal Thoughts: No HI Passive Intent and/or Plan: Without Intent; Without Plan   Sensorium  Memory:Immediate Good; Remote Good  Judgment:Poor  Insight:Poor   Executive Functions  Concentration:Fair  Attention Span:Fair  Recall:Good  Fund of Knowledge:Good  Language:Good   Psychomotor Activity  Psychomotor Activity:Psychomotor Activity: Normal   Assets  Assets:Communication Skills; Desire for Improvement; Financial Resources/Insurance; Resilience   Sleep  Sleep:Sleep: Good   Physical Exam  Physical Exam HENT:     Head: Normocephalic.  Cardiovascular:     Rate and Rhythm: Normal rate.  Pulmonary:     Effort: Pulmonary effort is normal.  Musculoskeletal:        General: Normal range of motion.     Cervical back: Normal range of motion.  Neurological:     Mental Status: He is alert and oriented to person, place, and time.  Psychiatric:        Mood and Affect: Mood is depressed.        Thought Content: Thought content includes suicidal ideation. Thought content includes suicidal plan.    Review of Systems  Psychiatric/Behavioral: Positive for depression, substance abuse and suicidal ideas.  All other systems reviewed and are negative.  Blood  pressure (!) 148/91, pulse 79, temperature (!) 97.5 F (36.4 C), temperature source Temporal, resp. rate 18, SpO2 100 %. There is no height or weight on file to calculate BMI.  Treatment Plan Summary: Daily contact with patient to assess and evaluate symptoms and progress in treatment, Medication management and Plan admit for inpatient psychiatric treatment  [facility], NP 01/13/2021 3:14 PM

## 2021-01-13 NOTE — ED Notes (Signed)
Pt resting in bed. No distress observed. Safety maintained and will continue to monitor. 

## 2021-01-13 NOTE — ED Notes (Signed)
Pt given breakfast.

## 2021-01-13 NOTE — ED Notes (Signed)
Pt sleeping@this time. Breathing even and unlabored. Will continue to monitor for safety 

## 2021-01-13 NOTE — ED Provider Notes (Signed)
FBC/OBS ASAP Discharge Summary  Date and Time: 01/13/2021 8:15 PM  Name: Chase Moss  MRN:  [id number]   Discharge Diagnoses:  Final diagnoses:  Substance induced mood disorder ([place])  Alcohol abuse  Cocaine abuse ([place])    Disposition: Patient to be transferred to [facility] for inpatient psychiatric treatment. [place] Nursing report given to [facility] Nursing staff. EMTALA form completed.   Subjective: Subjective from Waldon Merl, NP 01/13/21 note shown below:   "This provider approached pt as he was sleeping in his bed and woke him up. Pt was asked if he would like to be interviewed in a private room; pt declined,stating he was too tired to get up and he was fine talking there. Pt remained with his eyes closed during interview, but answered questions readily, with clear speech. Pt continues to endorse suicidal ideation, with plan to overdose on heroin. Pt denies homicidal ideation towards his brother-in-law or anyone else at this time, indicating that he only felt angry with brother-in-law and may have mispoke because of the circumstances of pt's sister's trailer being sold. Pt denies desire to hurt his brother-in-law in any way. Pt refers to provider notes of presentation to [facility], stating that he was released from jail on 01/06/21, after being incarcerated for 123 days for obtaining property under false pretenses.    Upon questioning pt regarding his suicidal ideation pt states that he has intent to overdose because he thought the trailer would be available to him to live in, and now he has nowhere to go. When this provider offered community shelters, pt stated that homelessness was only one of his problems, and his "maiin" problem" is that "They only gave me one of my meds in jail and I need to be hospitalized to get stable on my meds stop having crazy thoughts."    Pt says he made contact with "[facility]" on 01/12/21, where he has had ACTT team in the past. He states they are trying to help him  get housing and re-establish with them.  Plan is to admit pt to [facility] for stabilization and treatment. Per admissions coordinator pt has been accepted to rm [facility] and transportation has been arranged for 2000 today."  Spoke with patient this evening. He states he feels okay and is eating and drinking well. He states that he is aware of the plan to be transferred to [facility] and is agreeable to this plan.   Stay Summary: Patient presented to the [facility] on 01/13/21 voluntary via law enforcement and was admitted to [place] observation for further stabilization and treatment. Patient was then reassessed on 01/13/21 and it was determined that patient met inpatient psychiatric treatment criteria. Patient has been accepted to [facility] for inpatient psychiatric treatment.  Past Medical History:  Past Medical History:  Diagnosis Date  . Bipolar 1 disorder ([place])   . Depression   . Hypertension   . PTSD (post-traumatic stress disorder)    History reviewed. No pertinent surgical history. Family History:  Family History  Problem Relation Age of Onset  . Heart attack Other    Social History:  Social History   Substance and Sexual Activity  Alcohol Use Yes   Comment: 6-8 beers/day for last week     Social History   Substance and Sexual Activity  Drug Use Yes  . Types: Marijuana, Methamphetamines, Cocaine   Comment: coc- late last night "quite a bit"    Social History   Socioeconomic History  . Marital status: Divorced  Spouse name: Not on file  . Number of children: Not on file  . Years of education: Not on file  . Highest education level: Not on file  Occupational History  . Not on file  Tobacco Use  . Smoking status: Current Every Day Smoker    Packs/day: 0.25    Years: 30.00    Pack years: 7.50    Types: Cigarettes  . Smokeless tobacco: Never Used  Vaping Use  . Vaping Use: Never used  Substance and Sexual Activity  . Alcohol use: Yes    Comment: 6-8 beers/day for last week  .  Drug use: Yes    Types: Marijuana, Methamphetamines, Cocaine    Comment: coc- late last night "quite a bit"  . Sexual activity: Yes  Other Topics Concern  . Not on file  Social History Narrative  . Not on file   Social Determinants of Health   Financial Resource Strain: Not on file  Food Insecurity: Not on file  Transportation Needs: Not on file  Physical Activity: Not on file  Stress: Not on file  Social Connections: Not on file   SDOH:  SDOH Screenings   Alcohol Screen: Low Risk   . Last Alcohol Screening Score (AUDIT): 0  Depression (PHQ2-9): Medium Risk  . PHQ-2 Score: 25  Financial Resource Strain: Not on file  Food Insecurity: Not on file  Housing: Not on file  Physical Activity: Not on file  Social Connections: Not on file  Stress: Not on file  Tobacco Use: High Risk  . Smoking Tobacco Use: Current Every Day Smoker  . Smokeless Tobacco Use: Never Used  Transportation Needs: Not on file    Current Medications:  Current Facility-Administered Medications  Medication Dose Route Frequency Provider Last Rate Last Admin  . acetaminophen (TYLENOL) tablet 650 mg  650 mg Oral Q6H PRN Lindon Romp A, NP      . alum & mag hydroxide-simeth (MAALOX/MYLANTA) 200-200-20 MG/5ML suspension 30 mL  30 mL Oral Q4H PRN Lindon Romp A, NP      . ARIPiprazole (ABILIFY) tablet 10 mg  10 mg Oral Daily Lindon Romp A, NP   10 mg at 01/13/21 0908  . buPROPion (WELLBUTRIN XL) 24 hr tablet 150 mg  150 mg Oral Daily Lindon Romp A, NP   150 mg at 01/13/21 0908  . hydrOXYzine (ATARAX/VISTARIL) tablet 25 mg  25 mg Oral TID PRN Lindon Romp A, NP      . lisinopril (ZESTRIL) tablet 10 mg  10 mg Oral Daily Lindon Romp A, NP   10 mg at 01/13/21 0908  . magnesium hydroxide (MILK OF MAGNESIA) suspension 30 mL  30 mL Oral Daily PRN Lindon Romp A, NP      . mirtazapine (REMERON) tablet 15 mg  15 mg Oral QHS Lindon Romp A, NP   15 mg at 01/13/21 0200  . pantoprazole (PROTONIX) EC tablet 40 mg  40 mg  Oral Daily Lindon Romp A, NP   40 mg at 01/13/21 0908  . prazosin (MINIPRESS) capsule 2 mg  2 mg Oral QHS Lindon Romp A, NP   2 mg at 01/13/21 0201  . traZODone (DESYREL) tablet 50 mg  50 mg Oral QHS PRN Rozetta Nunnery, NP   50 mg at 01/13/21 0203   Current Outpatient Medications  Medication Sig Dispense Refill  . lisinopril (ZESTRIL) 10 MG tablet Take 1 tablet (10 mg total) by mouth daily. 30 tablet 0    PTA Medications: (Not in  a hospital admission)   Psychiatric Specialty Exam  Per Waldon Merl NP 01/13/21 PSE:  Presentation  General Appearance: [place] (Pt states he hasn't slept and kept eyes closed during interview)  Speech:Clear and Coherent  Speech Volume:Normal  Handedness:Right   Mood and Affect  Mood:Depressed; Hopeless  Affect:Blunt   Thought Process  Thought Processes:Coherent  Descriptions of Associations:Intact  Orientation:Full (Time, Place and Person)  Thought Content:WDL  Hallucinations:Hallucinations: None  Ideas of Reference:None  Suicidal Thoughts:Suicidal Thoughts: Yes, Active SI Active Intent and/or Plan: With Intent; With Access to Means; With Plan  Homicidal Thoughts:Homicidal Thoughts: No HI Passive Intent and/or Plan: Without Intent; Without Plan   Sensorium  Memory:Immediate Good; Remote Good  Judgment:Poor  Insight:Poor   Executive Functions  Concentration:Fair  Attention Span:Fair  Recall:Good  Fund of Knowledge:Good  Language:Good   Psychomotor Activity  Psychomotor Activity:Psychomotor Activity: Normal   Assets  Assets:Communication Skills; Desire for Improvement; Financial Resources/Insurance; Resilience   Sleep  Sleep:Sleep: Good     Review of Systems  Psychiatric/Behavioral: Positive for depression, substance abuse and suicidal ideas.  All other systems reviewed and are negative.    Vitals: Blood pressure (!) 148/91, pulse 79, temperature (!) 97.5 F (36.4 C),  temperature source Temporal, resp. rate 18, SpO2 100 %. There is no height or weight on file to calculate BMI.   Disposition: Patient to be transferred to [facility] for inpatient psychiatric treatment. [place] Nursing report given to [facility] Nursing staff. EMTALA form completed.   Chase Sours, PA-C 01/13/2021, 8:15 PM

## 2021-01-13 NOTE — ED Notes (Addendum)
Report called to [facility] RN Kathlene November. Patient aware of transfer, signed voluntary form & ready to go with no questions at this time. Does continue to endorse intermittent SI - none at this time, denies HI. Safe transport notified and on their way.

## 2021-01-13 NOTE — Telephone Encounter (Signed)
Care Management - Follow Up [facility] Discharges   Writer attempted to make contact with patient today and was unsuccessful.  Writer was not able to leave a HIPPA compliant voice message.    Per, chart review, patient does not have a phone.
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
## 2021-01-14 LAB — SARS CORONAVIRUS 2 (TAT 6-24 HRS): SARS Coronavirus 2: NEGATIVE

## 2021-01-14 MED ORDER — LORAZEPAM 1 MG PO TABS
1.0000 mg | ORAL_TABLET | Freq: Four times a day (QID) | ORAL | Status: DC | PRN
  Administered 2021-01-15: 1 mg via ORAL
  Filled 2021-01-14: qty 1

## 2021-01-14 MED ORDER — MIRTAZAPINE 15 MG PO TABS
15.0000 mg | ORAL_TABLET | Freq: Every day | ORAL | Status: DC
  Filled 2021-01-14 (×4): qty 1

## 2021-01-14 MED ORDER — ADULT MULTIVITAMIN W/MINERALS CH
1.0000 | ORAL_TABLET | Freq: Every day | ORAL | Status: DC
  Administered 2021-01-14 – 2021-01-16 (×3): 1 via ORAL
  Filled 2021-01-14 (×7): qty 1

## 2021-01-14 MED ORDER — HYDROXYZINE HCL 25 MG PO TABS
25.0000 mg | ORAL_TABLET | Freq: Four times a day (QID) | ORAL | Status: DC | PRN
  Administered 2021-01-16: 25 mg via ORAL
  Filled 2021-01-14: qty 1

## 2021-01-14 MED ORDER — PANTOPRAZOLE SODIUM 40 MG PO TBEC
40.0000 mg | DELAYED_RELEASE_TABLET | Freq: Every day | ORAL | Status: DC
  Administered 2021-01-14 – 2021-01-16 (×3): 40 mg via ORAL
  Filled 2021-01-14 (×5): qty 1

## 2021-01-14 MED ORDER — LOPERAMIDE HCL 2 MG PO CAPS: 2.0000 mg | ORAL_CAPSULE | ORAL | Status: DC | PRN

## 2021-01-14 MED ORDER — BUPROPION HCL ER (XL) 150 MG PO TB24
150.0000 mg | ORAL_TABLET | Freq: Every day | ORAL | Status: DC
  Administered 2021-01-14 – 2021-01-16 (×3): 150 mg via ORAL
  Filled 2021-01-14 (×5): qty 1

## 2021-01-14 MED ORDER — THIAMINE HCL 100 MG PO TABS
100.0000 mg | ORAL_TABLET | Freq: Every day | ORAL | Status: DC
  Administered 2021-01-15 – 2021-01-16 (×2): 100 mg via ORAL
  Filled 2021-01-14 (×5): qty 1

## 2021-01-14 MED ORDER — PRAZOSIN HCL 2 MG PO CAPS
2.0000 mg | ORAL_CAPSULE | Freq: Every day | ORAL | Status: DC
  Administered 2021-01-15: 2 mg via ORAL
  Filled 2021-01-14 (×4): qty 1

## 2021-01-14 MED ORDER — ARIPIPRAZOLE 10 MG PO TABS
10.0000 mg | ORAL_TABLET | Freq: Every day | ORAL | Status: DC
  Administered 2021-01-14 – 2021-01-16 (×3): 10 mg via ORAL
  Filled 2021-01-14 (×5): qty 1

## 2021-01-14 MED ORDER — LISINOPRIL 10 MG PO TABS
10.0000 mg | ORAL_TABLET | Freq: Every day | ORAL | Status: DC
  Administered 2021-01-14 – 2021-01-16 (×3): 10 mg via ORAL
  Filled 2021-01-14 (×5): qty 1

## 2021-01-14 MED ORDER — ONDANSETRON 4 MG PO TBDP: 4.0000 mg | ORAL_TABLET | Freq: Four times a day (QID) | ORAL | Status: DC | PRN

## 2021-01-14 NOTE — Progress Notes (Signed)
   01/14/21 1200  Psych Admission Type (Psych Patients Only)  Admission Status Voluntary  Psychosocial Assessment  Patient Complaints None  Eye Contact Fair  Facial Expression Sad  Affect Sad  Speech Slow;Soft  Interaction Assertive  Motor Activity Slow  Appearance/Hygiene Disheveled  Behavior Characteristics Cooperative;Calm  Mood Pleasant  Aggressive Behavior  Effect No apparent injury  Thought Process  Coherency WDL  Content WDL  Delusions None reported or observed  Perception WDL  Hallucination None reported or observed  Judgment WDL  Confusion WDL  Danger to Self  Current suicidal ideation? Denies  Self-Injurious Behavior Some self-injurious ideation observed or expressed.  No lethal plan expressed   Agreement Not to Harm Self Yes  Description of Agreement verbal contract for safety  Danger to Others  Danger to Others None reported or observed

## 2021-01-14 NOTE — [facility] Suicide Risk Assessment (Signed)
[facility] Admission Suicide Risk Assessment   Nursing information obtained from:  Patient Demographic factors:  Male,Caucasian,Low socioeconomic status,Unemployed Current Mental Status:  Suicidal ideation indicated by patient,Suicide plan Loss Factors:  Legal issues,Financial problems / change in socioeconomic status Historical Factors:  Prior suicide attempts Risk Reduction Factors:  Established with ACTT  Total Time Spent in Direct Patient Care:  I personally spent 30 minutes on the unit in direct patient care. The direct patient care time included face-to-face time with the patient, reviewing the patient's chart, communicating with other professionals, and coordinating care. Greater than 50% of this time was spent in counseling or coordinating care with the patient regarding goals of hospitalization, psycho-education, and discharge planning needs.  Principal Problem: MDD (major depressive disorder), recurrent severe, without psychosis (HCC) Diagnosis:  Principal Problem:   MDD (major depressive disorder), recurrent severe, without psychosis (HCC)  Subjective Data:  Chase Moss is a 55 y.o. male with a history substance induced mood disorder, bipolar disorder, PTSD, opioid use disorder, cocaine use disorder, and alcohol use disorder who presented to [facility] voluntarily with law enforcement on 01/13/21. He was reportedly in jail for 123 days on a warrant for failure to appear and previously was charged with obtaining property under false pretenses(writing bad checks). He is now on probation and was released from jail on 01/06/21. Since his release he reportedly had been drinking alcohol and using cocaine. He re-established with [facility] for ACT services after his release from jail but was not yet restarted on medications. Per his [facility] assessment, prior to going to jail he was taking Abilify, Wellbutrin, Remeron 30 mg, prazosin, lisinopril, and trazodone. He reported that while in jail he was  taking Abilify 10 mg daily but did not receive his other psychotropic medications. He reportedly was expressing suicidal ideations with plan to overdose on heroin while at the [facility]. See H&P for additional admission details.   Continued Clinical Symptoms:  Alcohol Use Disorder Identification Test Final Score (AUDIT): 6 The "Alcohol Use Disorders Identification Test", Guidelines for Use in Primary Care, Second Edition.  World Science writer [facility]). Score between 0-7:  no or low risk or alcohol related problems. Score between 8-15:  moderate risk of alcohol related problems. Score between 16-19:  high risk of alcohol related problems. Score 20 or above:  warrants further diagnostic evaluation for alcohol dependence and treatment.  CLINICAL FACTORS:   Depression diagnosis Polysubstance abuse Previous psychiatric diagnoses and treatment  Psychiatric Specialty Exam: Physical Exam Vitals reviewed.  HENT:     Head: Normocephalic.  Pulmonary:     Effort: Pulmonary effort is normal.  Neurological:     Mental Status: He is alert.     Review of Systems  Respiratory: Negative for shortness of breath.   Cardiovascular: Negative for chest pain.  Gastrointestinal: Negative for nausea and vomiting.    Blood pressure 128/82, pulse 86, temperature 97.6 F (36.4 C), temperature source Oral, resp. rate 18, height 5\' 10"  (1.778 m), weight 82.6 kg, SpO2 99 %.Body mass index is 26.11 kg/m.  General Appearance: resting in bed, fair hygiene  Eye Contact:  Fair  Speech:  Clear and Coherent and Normal Rate  Volume:  Normal  Mood:  Dysphoric  Affect:  Constricted  Thought Process:  Goal Directed and Linear  Orientation:  Full (Time, Place, and Person)  Thought Content:  Denies AVH, delusions, or paranoia  Suicidal Thoughts:  Yes.  without intent/plan  Homicidal Thoughts:  No  Memory:  Recent;   Fair  Judgement:  Impaired  Insight:  Fair  Psychomotor Activity:  Normal  Concentration:   Concentration: Fair  Recall:  Uncooperative for full testing  Fund of Knowledge:  Fair  Language:  Good  Akathisia:  Negative  Assets:  Communication Skills Desire for Improvement Others:  ACTT services  ADL's:  Intact  Cognition:  WNL   COGNITIVE FEATURES THAT CONTRIBUTE TO RISK:  None    SUICIDE RISK:   Moderate:  Frequent suicidal ideation with limited intensity, and duration, some specificity in terms of plans, no associated intent, good self-control, limited dysphoria/symptomatology, some risk factors present, and identifiable protective factors, including available and accessible social support.  PLAN OF CARE: Patient is reportedly followed with ACTT and social work will need to contact them for coordination of care. He has been started on Abilify 10mg  daily, Wellbutrin XL 150mg  qam (he denies seizure history),  Prazosin 2mg  qhs, and Remeron 15mg  qhs. He is on Zestril 10mg  daily for HTN and Protonix 40mg  daily for GI prophylaxis. He will be placed on CIWA protocol for monitoring of alcohol withdrawal. Admission labs were reviewed and CMP WNL except for calcium 8.7, total protein 6.2; Lipids WNL except for triglycerides 180; CBC WNL; HbgA1c 4.9, TSH 1.417, alcohol <10, UDS positive for THC and cocaine. QTc 459.  I certify that inpatient services furnished can reasonably be expected to improve the patient's condition.   , MD, FAPA 01/14/2021, 6:14 PM

## 2021-01-14 NOTE — [facility] Suicide Risk Assessment (Signed)
[facility] INPATIENT:  Family/Significant Other Suicide Prevention Education  Suicide Prevention Education:  Patient Refusal for Family/Significant Other Suicide Prevention Education: The patient Chase Moss has refused to provide written consent for family/significant other to be provided Family/Significant Other Suicide Prevention Education during admission and/or prior to discharge.  Physician notified.  Patient states he has nobody in his life that could receive this information.  Carloyn Jaeger Grossman-Orr 01/14/2021, 3:21 PM

## 2021-01-14 NOTE — Progress Notes (Signed)
[place] NOVEL CORONAVIRUS (COVID-19) DAILY CHECK-OFF SYMPTOMS - answer yes or no to each - every day NO YES  Have you had a fever in the past 24 hours?  . Fever (Temp > 37.80C / 100F) X   Have you had any of these symptoms in the past 24 hours? . New Cough .  Sore Throat  .  Shortness of Breath .  Difficulty Breathing .  Unexplained Body Aches   X   Have you had any one of these symptoms in the past 24 hours not related to allergies?   . Runny Nose .  Nasal Congestion .  Sneezing   X   If you have had runny nose, nasal congestion, sneezing in the past 24 hours, has it worsened?  X   EXPOSURES - check yes or no X   Have you traveled outside the state in the past 14 days?  X   Have you been in contact with someone with a confirmed diagnosis of COVID-19 or PUI in the past 14 days without wearing appropriate PPE?  X   Have you been living in the same home as a person with confirmed diagnosis of COVID-19 or a PUI (household contact)?    X   Have you been diagnosed with COVID-19?    X              What to do next: Answered NO to all: Answered YES to anything:   Proceed with unit schedule Follow the [facility] Inpatient Flowsheet.   
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
## 2021-01-15 NOTE — Progress Notes (Signed)
   01/15/21 1600  Psych Admission Type (Psych Patients Only)  Admission Status Voluntary  Psychosocial Assessment  Patient Complaints Worrying  Eye Contact Brief  Facial Expression Anxious  Affect Appropriate to circumstance  Speech Logical/coherent  Interaction Assertive  Motor Activity Slow  Appearance/Hygiene Unremarkable  Behavior Characteristics Cooperative  Mood Anxious;Depressed;Pleasant  Aggressive Behavior  Effect No apparent injury  Thought Process  Coherency WDL  Content WDL  Delusions None reported or observed  Perception WDL  Hallucination None reported or observed  Judgment WDL  Confusion WDL  Danger to Self  Current suicidal ideation? Denies  Self-Injurious Behavior No self-injurious ideation or behavior indicators observed or expressed   Agreement Not to Harm Self Yes  Description of Agreement verbal contract  Danger to Others  Danger to Others None reported or observed
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
## 2021-01-15 NOTE — Progress Notes (Signed)
   01/15/21 2050  COVID-19 Daily Checkoff  Have you had a fever (temp > 37.80C/100F)  in the past 24 hours?  No  If you have had runny nose, nasal congestion, sneezing in the past 24 hours, has it worsened? No  COVID-19 EXPOSURE  Have you traveled outside the state in the past 14 days? No  Have you been in contact with someone with a confirmed diagnosis of COVID-19 or PUI in the past 14 days without wearing appropriate PPE? No  Have you been living in the same home as a person with confirmed diagnosis of COVID-19 or a PUI (household contact)? No  Have you been diagnosed with COVID-19? No

## 2021-01-15 NOTE — Progress Notes (Signed)

## 2021-01-15 NOTE — Progress Notes (Signed)
   01/15/21 2050  Psych Admission Type (Psych Patients Only)  Admission Status Voluntary  Psychosocial Assessment  Patient Complaints Worrying  Eye Contact Brief  Facial Expression Anxious  Affect Appropriate to circumstance  Speech Logical/coherent  Interaction Assertive  Motor Activity Slow  Appearance/Hygiene Unremarkable  Behavior Characteristics Cooperative;Appropriate to situation;Anxious  Mood Pleasant;Preoccupied;Depressed;Anxious  Aggressive Behavior  Effect No apparent injury  Thought Process  Coherency WDL  Content WDL  Delusions None reported or observed  Perception WDL  Hallucination None reported or observed  Judgment WDL  Confusion WDL  Danger to Self  Current suicidal ideation? Denies  Self-Injurious Behavior No self-injurious ideation or behavior indicators observed or expressed   Agreement Not to Harm Self Yes  Description of Agreement verbal contract  Danger to Others  Danger to Others None reported or observed

## 2021-01-15 NOTE — Progress Notes (Addendum)
[facility] MD Progress Note  01/15/2021 8:45 AM Kenon Delashmit  MRN:  [id number]   Subjective:  "I feel groggy but the medication is working."  Patient brief:  55 yo male presented with depression and suicidal ideations with cocaine abuse.  Goal to restarted medications and find a place to live.  His disability check was diverted while in jail.  His ACT team, [facility], has a place for him to live once they get his check.    Patient is a 55 y/o male who presents with suicidal ideations, anxiety and continued depressed. He states that he feels better now that his medications have been restarted but has been dealing with anxiety since he can't get in contact with his probation officer. He has attempted to reach out daily with no response. Rates his depression at 7/10 today with 50/50 suicidal ideations due to concerns about housing and his probation officer situation. When asked about suicidal ideation he states "I think about death on and off, I don't want to do it right now but if I did do it I would get some heroine and snort it because then I could go peacefully." His anxiety is rated at 9/10 due to the same. Per conversation, he is taking his PRN to help with the anxiety. Patient has a history of polysubstance use which include heroine, cocaine, K2, suboxone and marijuana which he states he was able to obtain while in jail, denies any withdrawal symptoms at this time. He is being followed by his ACT team, [facility], which he states has been very helpful with his medications and care. They will be assisting with housing, per conversation. Patient feels this will be instrumental in bettering his mental health because "I will be able to get off the street, stop pan handling and getting drugs off the street." Patient states he wants to take things one day at a time and get better. His appetite and sleep are "good, almost too much." At the present time patient has no family support system, he is  estranged from his brother and sister after his parents died. The only sister he stayed in contact with died last year and patient states this "made me unravel and everything got worse, that's right before I went to jail." Patient denies any auditory or visual hallucinations at this time. His goals for today are to get in touch with his probation officer, Ezekiel Ina at [phone] ext. [phone], and to speak with the social worker so they can get his housing situated.   Principal Problem: MDD (major depressive disorder), recurrent severe, without psychosis (HCC) Diagnosis: Principal Problem:   MDD (major depressive disorder), recurrent severe, without psychosis (HCC)  Total Time spent with patient: 45 minutes  Past Psychiatric History: depression, anxiety, PTSD  Past Medical History:  Past Medical History:  Diagnosis Date  . Bipolar 1 disorder (HCC)   . Depression   . Hypertension   . PTSD (post-traumatic stress disorder)    History reviewed. No pertinent surgical history. Family History:  Family History  Problem Relation Age of Onset  . Heart attack Other    Family Psychiatric  History: none Social History:  Social History   Substance and Sexual Activity  Alcohol Use Yes   Comment: 6-8 beers/day for last week     Social History   Substance and Sexual Activity  Drug Use Yes  . Types: Marijuana, Methamphetamines, Cocaine   Comment: coc- late last night "quite a bit"  Social History   Socioeconomic History  . Marital status: Divorced    Spouse name: Not on file  . Number of children: Not on file  . Years of education: Not on file  . Highest education level: Not on file  Occupational History  . Not on file  Tobacco Use  . Smoking status: Current Every Day Smoker    Packs/day: 0.25    Years: 30.00    Pack years: 7.50    Types: Cigarettes  . Smokeless tobacco: Never Used  Vaping Use  . Vaping Use: Never used  Substance and Sexual Activity  . Alcohol use: Yes     Comment: 6-8 beers/day for last week  . Drug use: Yes    Types: Marijuana, Methamphetamines, Cocaine    Comment: coc- late last night "quite a bit"  . Sexual activity: Yes  Other Topics Concern  . Not on file  Social History Narrative  . Not on file   Social Determinants of Health   Financial Resource Strain: Not on file  Food Insecurity: Not on file  Transportation Needs: Not on file  Physical Activity: Not on file  Stress: Not on file  Social Connections: Not on file   Additional Social History: homeless     Sleep: Good  Appetite:  Good  Current Medications: Current Facility-Administered Medications  Medication Dose Route Frequency Provider Last Rate Last Admin  . acetaminophen (TYLENOL) tablet 650 mg  650 mg Oral Q6H PRN Nwoko, Uchenna E, PA      . alum & mag hydroxide-simeth (MAALOX/MYLANTA) 200-200-20 MG/5ML suspension 30 mL  30 mL Oral Q4H PRN Nwoko, Uchenna E, PA      . ARIPiprazole (ABILIFY) tablet 10 mg  10 mg Oral Daily Melbourne Abts W, PA-C   10 mg at 01/15/21 0827  . buPROPion (WELLBUTRIN XL) 24 hr tablet 150 mg  150 mg Oral Daily Melbourne Abts W, PA-C   150 mg at 01/15/21 0827  . hydrOXYzine (ATARAX/VISTARIL) tablet 25 mg  25 mg Oral Q6H PRN Mason Jim, Shawanna Zanders E, MD      . lisinopril (ZESTRIL) tablet 10 mg  10 mg Oral Daily Melbourne Abts W, PA-C   10 mg at 01/15/21 4492  . loperamide (IMODIUM) capsule 2-4 mg  2-4 mg Oral PRN Mason Jim, Mortimer Bair E, MD      . LORazepam (ATIVAN) tablet 1 mg  1 mg Oral Q6H PRN Mason Jim, Nuria Phebus E, MD      . magnesium hydroxide (MILK OF MAGNESIA) suspension 15 mL  15 mL Oral Daily PRN Nwoko, Uchenna E, PA      . mirtazapine (REMERON) tablet 15 mg  15 mg Oral QHS Melbourne Abts W, PA-C      . multivitamin with minerals tablet 1 tablet  1 tablet Oral Daily Comer Locket, MD   1 tablet at 01/15/21 [phone]  . ondansetron (ZOFRAN-ODT) disintegrating tablet 4 mg  4 mg Oral Q6H PRN Mason Jim, Jahnia Hewes E, MD      . pantoprazole (PROTONIX) EC tablet 40 mg  40 mg  Oral Daily Melbourne Abts W, PA-C   40 mg at 01/15/21 0827  . prazosin (MINIPRESS) capsule 2 mg  2 mg Oral QHS Ladona Ridgel, Cody W, PA-C      . thiamine tablet 100 mg  100 mg Oral Daily Mason Jim, Amahia Madonia E, MD   100 mg at 01/15/21 0826  . traZODone (DESYREL) tablet 50 mg  50 mg Oral QHS PRN Nwoko, Tommas Olp, PA  Lab Results:  Results for orders placed or performed during the hospital encounter of 01/13/21 (from the past 48 hour(s))  SARS CORONAVIRUS 2 (TAT 6-24 HRS) Nasopharyngeal Nasopharyngeal Swab     Status: None   Collection Time: 01/14/21  9:20 AM   Specimen: Nasopharyngeal Swab  Result Value Ref Range   SARS Coronavirus 2 NEGATIVE NEGATIVE    Comment: (NOTE) SARS-CoV-2 target nucleic acids are NOT DETECTED.  The SARS-CoV-2 RNA is generally detectable in upper and lower respiratory specimens during the acute phase of infection. Negative results do not preclude SARS-CoV-2 infection, do not rule out co-infections with other pathogens, and should not be used as the sole basis for treatment or other patient management decisions. Negative results must be combined with clinical observations, patient history, and epidemiological information. The expected result is Negative.  Fact Sheet for Patients: HairSlick.nohttps://www.fda.gov/media/138098/download  Fact Sheet for Healthcare Providers: quierodirigir.comhttps://www.fda.gov/media/138095/download  This test is not yet approved or cleared by the [place] FDA and  has been authorized for detection and/or diagnosis of SARS-CoV-2 by FDA under an Emergency Use Authorization (EUA). This EUA will remain  in effect (meaning this test can be used) for the duration of the COVID-19 declaration under Se ction 564(b)(1) of the Act, 21 U.S.C. section 360bbb-3(b)(1), unless the authorization is terminated or revoked sooner.  Performed at [facility], [address] [place], [place] [address]     Blood Alcohol level:  Lab Results  Component Value Date    [facility] 01/13/2021   ETH <10 09/04/2020    Metabolic Disorder Labs: Lab Results  Component Value Date   HGBA1C 4.9 01/13/2021   MPG 93.93 01/13/2021   MPG 108.28 07/16/2018   Lab Results  Component Value Date   PROLACTIN 21.0 (H) 06/21/2016   Lab Results  Component Value Date   CHOL 160 01/13/2021   TRIG 180 (H) 01/13/2021   HDL 54 01/13/2021   CHOLHDL 3.0 01/13/2021   VLDL 36 01/13/2021   LDLCALC 70 01/13/2021   LDLCALC 80 06/14/2020    Physical Findings: AIMS: Facial and Oral Movements Muscles of Facial Expression: None, normal Lips and Perioral Area: None, normal Jaw: None, normal Tongue: None, normal,Extremity Movements Upper (arms, wrists, hands, fingers): None, normal Lower (legs, knees, ankles, toes): None, normal, Trunk Movements Neck, shoulders, hips: None, normal, Overall Severity Severity of abnormal movements (highest score from questions above): None, normal Incapacitation due to abnormal movements: None, normal Patient's awareness of abnormal movements (rate only patient's report): No Awareness, Dental Status Current problems with teeth and/or dentures?: No Does patient usually wear dentures?: No  CIWA:  CIWA-Ar Total: 0 COWS:  COWS Total Score: 0  Musculoskeletal: Strength & Muscle Tone: within normal limits Gait & Station: normal Patient leans: Right  Psychiatric Specialty Exam: Physical Exam Vitals and nursing note reviewed.  Constitutional:      Appearance: Normal appearance.  HENT:     Head: Normocephalic.     Nose: Nose normal.  Pulmonary:     Effort: Pulmonary effort is normal.  Musculoskeletal:        General: Normal range of motion.     Cervical back: Normal range of motion.  Neurological:     General: No focal deficit present.     Mental Status: He is alert and oriented to person, place, and time.  Psychiatric:        Attention and Perception: Attention and perception normal.        Mood and Affect: Mood is  anxious and  depressed.        Speech: Speech normal.        Behavior: Behavior normal. Behavior is cooperative.        Thought Content: Thought content normal.        Cognition and Memory: Cognition and memory normal.        Judgment: Judgment normal.     Review of Systems  Psychiatric/Behavioral: Positive for dysphoric mood. The patient is nervous/anxious.   All other systems reviewed and are negative.   Blood pressure 128/82, pulse 86, temperature 97.6 F (36.4 C), temperature source Oral, resp. rate 18, height 5\' 10"  (1.778 m), weight 82.6 kg, SpO2 99 %.Body mass index is 26.11 kg/m.  General Appearance: Casual, Fairly Groomed and Neat  Eye Contact:  Good  Speech:  Clear and Coherent and Normal Rate  Volume:  Normal  Mood:  Anxious and Depressed  Affect:  Congruent and Depressed  Thought Process:  Coherent, Goal Directed and Linear  Orientation:  Full (Time, Place, and Person)  Thought Content:  WDL and Logical  Suicidal Thoughts:  Yes.  with intent/plan  Homicidal Thoughts:  No  Memory:  Immediate;   Good Recent;   Good Remote;   Good  Judgement:  Fair  Insight:  Fair  Psychomotor Activity:  Normal  Concentration:  Concentration: Good and Attention Span: Good  Recall:  Good  Fund of Knowledge:  Good  Language:  Good  Akathisia:  NA  Handed:  Right  AIMS (if indicated):     Assets:  Communication Skills Desire for Improvement Leisure Time Physical Health Resilience  ADL's:  Intact  Cognition:  WNL  Sleep: Good      Treatment Plan Summary: Major Depressive Disorder, recurrent severe, without psychosis    --  Continue Wellbutrin XL 150mg  daily for depression    --  Continue Abilify 10mg  daily for depression     -- Continue Remeron 15mg  nightly for depression  Insomnia    -- Continue Trazodone 50mg  nightly PRN for insomnia   Anxiety   -- Continue Hydroxyzine 25mg  Q6 PRN for anxiety  PTSD    -- Continue Prazosin 2mg  nightly for nightmares  ,  NP 01/15/2021, 8:45 AM

## 2021-01-15 NOTE — [facility] Group Notes (Signed)
Adult Psychoeducational Group Not Date:  01/15/2021 Time:  0900-1045 Group Topic/Focus: PROGRESSIVE RELAXATION. A group where deep breathing is taught and tensing and relaxation muscle groups is used. Imagery is used as well.  Pts are asked to imagine 3 pillars that hold them up when they are not able to hold themselves up.  Participation Level:  Did not attend   Chase Moss A 01/15/2021   

## 2021-01-15 NOTE — Progress Notes (Signed)
   01/15/21 2050  COVID-19 Daily Checkoff  Have you had a fever (temp > 37.80C/100F)  in the past 24 hours?  No  If you have had runny nose, nasal congestion, sneezing in the past 24 hours, has it worsened? No  COVID-19 EXPOSURE  Have you traveled outside the state in the past 14 days? No  Have you been in contact with someone with a confirmed diagnosis of COVID-19 or PUI in the past 14 days without wearing appropriate PPE? No  Have you been living in the same home as a person with confirmed diagnosis of COVID-19 or a PUI (household contact)? No  Have you been diagnosed with COVID-19? No   

## 2021-01-15 NOTE — Progress Notes (Signed)
   01/14/21 2300  COVID-19 Daily Checkoff  Have you had a fever (temp > 37.80C/100F)  in the past 24 hours?  No  If you have had runny nose, nasal congestion, sneezing in the past 24 hours, has it worsened? No  COVID-19 EXPOSURE  Have you traveled outside the state in the past 14 days? No  Have you been in contact with someone with a confirmed diagnosis of COVID-19 or PUI in the past 14 days without wearing appropriate PPE? No  Have you been living in the same home as a person with confirmed diagnosis of COVID-19 or a PUI (household contact)? No  Have you been diagnosed with COVID-19? No

## 2021-01-16 DIAGNOSIS — F332 Major depressive disorder, recurrent severe without psychotic features: Principal | ICD-10-CM

## 2021-01-16 MED ORDER — MIRTAZAPINE 15 MG PO TABS: 15.0000 mg | ORAL_TABLET | Freq: Every day | ORAL | 0 refills | Status: AC

## 2021-01-16 MED ORDER — ARIPIPRAZOLE 10 MG PO TABS: 10.0000 mg | ORAL_TABLET | Freq: Every day | ORAL | 0 refills | Status: AC

## 2021-01-16 NOTE — [facility] Counselor (Signed)
CSW spoke with the patient who states that he will be staying with a friend for a few weeks.  The Patient provides this address as [address] in [place].  CSW discussed follow up with the Patients ACTT Team with the patient.

## 2021-01-16 NOTE — Plan of Care (Signed)
Nurse discussed anxiety, depression and coping skills with patient.  

## 2021-01-16 NOTE — Progress Notes (Signed)
  [facility] Adult Case Management Discharge Plan :  Will you be returning to the same living situation after discharge:  No. At discharge, do you have transportation home?: Yes,  Friend  Do you have the ability to pay for your medications: Yes,  Medicaid   Release of information consent forms completed and in the chart;  Patient's signature needed at discharge.  Patient to Follow up at:  Follow-up Information    [facility] Follow up.   Why: ACTT Team services to resume.  Contact information: [address] [place] [address] [place] [address] [phone]               Next level of care provider has access to [facility] Link:no  Safety Planning and Suicide Prevention discussed: Yes,  with patient  Have you used any form of tobacco in the last 30 days? (Cigarettes, Smokeless Tobacco, Cigars, and/or Pipes): Yes  Has patient been referred to the Quitline?: Patient refused referral  Patient has been referred for addiction treatment: Pt. refused referral  Aram Beecham, LCSWA 01/16/2021, 10:43 AM

## 2021-01-16 NOTE — [facility] Group Notes (Signed)
[facility] LCSW Group Therapy  01/16/2021 2:25 PM  Type of Therapy:  Group Therapy:Stress Management  Participation Level:  Active  Summary of Progress/Problems: Pt received the group session handout. Pt shared during introductions that his favorite color is purple because he likes it. Pt shared that he currently smokes weed to cope with stressors but would like to start exercising instead. Pt left group early due to discharging.   Chase Moss 01/16/2021, 2:25 PM

## 2021-01-16 NOTE — Discharge Summary (Signed)
Physician Discharge Summary Note  Patient:  Chase Moss is an 55 y.o., male MRN:  [id number] DOB:  11/11/1966 Patient phone:  [phone] (home)  [place]   [place] [id number],  Total Time spent with patient: 30 minutes  Date of Admission:  01/13/2021 Date of Discharge: 01/16/2021  Reason for Admission:  Chase Moss is a 55 yo male presents for suicidal ideations with depression and anxiety.  Stabilized on a medication regiment prior to going to jail for "obtaining property under false pretenses." Released a week ago, has an ACT team, [facility].  He wants his medications restarted, these are now in place.  Rates his depression a 7/10 today with no suicidal/homicidal ideations.  10/10 anxiety, encouraged him to ask for his PRN hydroxyzine to assist.    Today, patient was seen and evaluated. He stated he feels better after having his medications restarted. He denies suicidal and homicidal ideation, plan or intent. He denies paranoia and delusions. He was recently released from jail and has been off his medications for several months while incarcerated. He denies access to weapons. He stated he felt like he wished he would have died after being released form jail and finding out the home he had been living in was sold while he was in jail. He receives services from [facility]T and has been in touch with them since his release from jail. He has been sleeping and eating well. His mood has stabilized and he feels ready to be discharged. He is able to contract for safety. He has been taking his medications and has no complaint of side effects. He has been attending group therapy and is appropriate with staff and peers. He has follow up appointments, listed below. He is able to contract for safety. He has no physical complaints, his vital signs are stable. Patient is stable for discharge home.   Principal Problem: MDD (major depressive disorder), recurrent severe,  without psychosis (HCC) Discharge Diagnoses: Principal Problem:   MDD (major depressive disorder), recurrent severe, without psychosis (HCC)   Past Psychiatric History: depression, polysubstance d/o  Past Medical History:  Past Medical History:  Diagnosis Date  . Bipolar 1 disorder (HCC)   . Depression   . Hypertension   . PTSD (post-traumatic stress disorder)    History reviewed. No pertinent surgical history. Family History:  Family History  Problem Relation Age of Onset  . Heart attack Other    Family Psychiatric  History: brothers and sisters with substance use d/o, mother with depression and anxiety Social History:  Social History   Substance and Sexual Activity  Alcohol Use Yes   Comment: 6-8 beers/day for last week     Social History   Substance and Sexual Activity  Drug Use Yes  . Types: Marijuana, Methamphetamines, Cocaine   Comment: coc- late last night "quite a bit"    Social History   Socioeconomic History  . Marital status: Divorced    Spouse name: Not on file  . Number of children: Not on file  . Years of education: Not on file  . Highest education level: Not on file  Occupational History  . Not on file  Tobacco Use  . Smoking status: Current Every Day Smoker    Packs/day: 0.25    Years: 30.00    Pack years: 7.50    Types: Cigarettes  . Smokeless tobacco: Never Used  Vaping Use  . Vaping Use: Never used  Substance and Sexual Activity  .  Alcohol use: Yes    Comment: 6-8 beers/day for last week  . Drug use: Yes    Types: Marijuana, Methamphetamines, Cocaine    Comment: coc- late last night "quite a bit"  . Sexual activity: Yes  Other Topics Concern  . Not on file  Social History Narrative  . Not on file   Social Determinants of Health   Financial Resource Strain: Not on file  Food Insecurity: Not on file  Transportation Needs: Not on file  Physical Activity: Not on file  Stress: Not on file  Social Connections: Not on file     Hospital Course:  He remained on the [facility] unit for 2 days. He was started on Abilify and Wellbutrin. He participated in group therapy on the unit. He responded well to treatment with no adverse effects reported. He has shown improved mood, affect, sleep, and interaction. He denies any SI/HI/AVH and contracts for safety. He is discharging on the medications listed below. He agrees to follow up at [facility]. Patient is provided with prescriptions for medications upon discharge. Chase Moss  is being picked up by a friend for discharge to home.   Physical Findings: AIMS: Facial and Oral Movements Muscles of Facial Expression: None, normal Lips and Perioral Area: None, normal Jaw: None, normal Tongue: None, normal,Extremity Movements Upper (arms, wrists, hands, fingers): None, normal Lower (legs, knees, ankles, toes): None, normal, Trunk Movements Neck, shoulders, hips: None, normal, Overall Severity Severity of abnormal movements (highest score from questions above): None, normal Incapacitation due to abnormal movements: None, normal Patient's awareness of abnormal movements (rate only patient's report): No Awareness, Dental Status Current problems with teeth and/or dentures?: No Does patient usually wear dentures?: No  CIWA:  CIWA-Ar Total: 1 COWS:  COWS Total Score: 0  Musculoskeletal: Strength & Muscle Tone: within normal limits Gait & Station: normal Patient leans: N/A  Psychiatric Specialty Exam: Physical Exam Constitutional:      Appearance: Normal appearance.  HENT:     Head: Normocephalic.  Pulmonary:     Effort: Pulmonary effort is normal.  Musculoskeletal:        General: Normal range of motion.     Cervical back: Normal range of motion.  Neurological:     General: No focal deficit present.     Mental Status: He is alert and oriented to person, place, and time.  Psychiatric:        Attention and Perception: Attention and perception normal.        Mood  and Affect: Mood normal.        Speech: Speech normal.        Behavior: Behavior normal. Behavior is cooperative.        Thought Content: Thought content normal.        Cognition and Memory: Cognition normal.     Review of Systems  Constitutional: Negative for activity change and appetite change.  Respiratory: Negative for chest tightness and shortness of breath.   Cardiovascular: Negative for chest pain.  Gastrointestinal: Negative for abdominal pain.  Neurological: Negative for facial asymmetry and headaches.    Blood pressure 137/84, pulse 90, temperature 97.6 F (36.4 C), temperature source Oral, resp. rate 18, height 5\' 10"  (1.778 m), weight 82.6 kg, SpO2 99 %.Body mass index is 26.11 kg/m.  General Appearance: Casual  Eye Contact:  Good  Speech:  Clear and Coherent and Normal Rate  Volume:  Normal  Mood:  Euthymic  Affect:  Congruent  Thought Process:  Coherent  and Descriptions of Associations: Intact  Orientation:  Full (Time, Place, and Person)  Thought Content:  Logical and Hallucinations: None  Suicidal Thoughts:  No  Homicidal Thoughts:  No  Memory:  Immediate;   Fair Recent;   Fair Remote;   Fair  Judgement:  Fair  Insight:  Fair  Psychomotor Activity:  Normal  Concentration:  Concentration: Fair and Attention Span: Fair  Recall:  Fair  Fund of Knowledge:  Good  Language:  Good  Akathisia:  No  Handed:  Right  AIMS (if indicated):     Assets:  Communication Skills Desire for Improvement Resilience Social Support  ADL's:  Intact  Cognition:  WNL  Sleep:  Number of Hours: 6.5     Have you used any form of tobacco in the last 30 days? (Cigarettes, Smokeless Tobacco, Cigars, and/or Pipes): Yes  Has this patient used any form of tobacco in the last 30 days? (Cigarettes, Smokeless Tobacco, Cigars, and/or Pipes) Yes, Yes, Prescription not provided because: patient declined  Blood Alcohol level:  Lab Results  Component Value Date   ETH <10 01/13/2021    ETH <10 09/04/2020    Metabolic Disorder Labs:  Lab Results  Component Value Date   HGBA1C 4.9 01/13/2021   MPG 93.93 01/13/2021   MPG 108.28 07/16/2018   Lab Results  Component Value Date   PROLACTIN 21.0 (H) 06/21/2016   Lab Results  Component Value Date   CHOL 160 01/13/2021   TRIG 180 (H) 01/13/2021   HDL 54 01/13/2021   CHOLHDL 3.0 01/13/2021   VLDL 36 01/13/2021   LDLCALC 70 01/13/2021   LDLCALC 80 06/14/2020    See Psychiatric Specialty Exam and Suicide Risk Assessment completed by Attending Physician prior to discharge.  Discharge destination:  Home  Is patient on multiple antipsychotic therapies at discharge:  Yes,   Do you recommend tapering to monotherapy for antipsychotics?  No   Has Patient had three or more failed trials of antipsychotic monotherapy by history:  No  Recommended Plan for Multiple Antipsychotic Therapies: NA   Allergies as of 01/16/2021   No Known Allergies     Medication List    TAKE these medications     Indication  ARIPiprazole 10 MG tablet Commonly known as: ABILIFY Take 1 tablet (10 mg total) by mouth daily. Start taking on: January 17, 2021  Indication: Major Depressive Disorder, Mood control   lisinopril 10 MG tablet Commonly known as: ZESTRIL Take 1 tablet (10 mg total) by mouth daily.  Indication: High Blood Pressure Disorder   mirtazapine 15 MG tablet Commonly known as: REMERON Take 1 tablet (15 mg total) by mouth at bedtime.  Indication: Major Depressive Disorder, Insomnia       Follow-up Information    [facility] Follow up.   Why: ACTT Team services to resume.  Contact information: [address] [place] [address] [phone]               Follow-up recommendations:  Activity:  as tolerated Diet:  Heart healthy  Comments:  Patient is instructed prior to discharge to:  Take all medications as prescribed by his/her mental healthcare provider. Report any adverse effects  and or reactions from the medicines to his/her outpatient provider promptly. Patient has been instructed & cautioned: To not engage in alcohol and or illegal drug use while on prescription medicines. In the event of worsening symptoms, patient is instructed to call the crisis hotline, 911 and or go  to the nearest ED for appropriate evaluation and treatment of symptoms. To follow-up with his/her primary care provider for your other medical issues, concerns and or health care needs.  Signed: Laveda Abbe, NP 01/16/2021, 2:42 PM

## 2021-01-16 NOTE — BHH Group Notes (Signed)
Adult Psychoeducational Group Note  Date:  01/16/2021 Time:  9:38 AM  Group Topic/Focus:  Goals Group:   The focus of this group is to help patients establish daily goals to achieve during treatment and discuss how the patient can incorporate goal setting into their daily lives to aide in recovery.  Participation Level:  Did Not Attend  Chase Moss J Chase Moss 01/16/2021, 9:38 AM 

## 2021-01-16 NOTE — Progress Notes (Signed)
Recreation Therapy Notes  Date:  1.24.22 Time: 0930 Location: [address] Group Room  Group Topic: Stress Management  Goal Area(s) Addresses:  Patient will identify positive stress management techniques. Patient will identify benefits of using stress management post d/c.  Intervention: Stress Management  Activity: Meditation.  LRT played Moss meditation that focused on making the most of your day.  Patients are to follow along as meditation is plays to fully engage in the meditation.  Education:  Stress Management, Discharge Planning.   Education Outcome: Acknowledges Education  Clinical Observations/Feedback: Pt did not attend group session.    Naila Elizondo, LRT/CTRS         Chase Moss 01/16/2021 11:16 AM 

## 2021-01-16 NOTE — Tx Team (Addendum)
Interdisciplinary Treatment and Diagnostic Plan Update  01/16/2021 Time of Session: 9:05am Chase Moss MRN: [id number]  Principal Diagnosis: MDD (major depressive disorder), recurrent severe, without psychosis (HCC)  Secondary Diagnoses: Principal Problem:   MDD (major depressive disorder), recurrent severe, without psychosis (HCC)   Current Medications:  Current Facility-Administered Medications  Medication Dose Route Frequency Provider Last Rate Last Admin  . acetaminophen (TYLENOL) tablet 650 mg  650 mg Oral Q6H PRN Nwoko, Uchenna E, PA      . alum & mag hydroxide-simeth (MAALOX/MYLANTA) 200-200-20 MG/5ML suspension 30 mL  30 mL Oral Q4H PRN Nwoko, Uchenna E, PA      . ARIPiprazole (ABILIFY) tablet 10 mg  10 mg Oral Daily Melbourne Abts W, PA-C   10 mg at 01/16/21 0900  . buPROPion (WELLBUTRIN XL) 24 hr tablet 150 mg  150 mg Oral Daily Melbourne Abts W, PA-C   150 mg at 01/16/21 0900  . hydrOXYzine (ATARAX/VISTARIL) tablet 25 mg  25 mg Oral Q6H PRN Comer Locket, MD   25 mg at 01/16/21 0950  . lisinopril (ZESTRIL) tablet 10 mg  10 mg Oral Daily Melbourne Abts W, PA-C   10 mg at 01/16/21 0900  . loperamide (IMODIUM) capsule 2-4 mg  2-4 mg Oral PRN Mason Jim, Amy E, MD      . LORazepam (ATIVAN) tablet 1 mg  1 mg Oral Q6H PRN Comer Locket, MD   1 mg at 01/15/21 1709  . magnesium hydroxide (MILK OF MAGNESIA) suspension 15 mL  15 mL Oral Daily PRN Nwoko, Uchenna E, PA      . mirtazapine (REMERON) tablet 15 mg  15 mg Oral QHS Melbourne Abts W, PA-C      . multivitamin with minerals tablet 1 tablet  1 tablet Oral Daily Mason Jim, Amy E, MD   1 tablet at 01/16/21 0900  . ondansetron (ZOFRAN-ODT) disintegrating tablet 4 mg  4 mg Oral Q6H PRN Mason Jim, Amy E, MD      . pantoprazole (PROTONIX) EC tablet 40 mg  40 mg Oral Daily Melbourne Abts W, PA-C   40 mg at 01/16/21 0900  . prazosin (MINIPRESS) capsule 2 mg  2 mg Oral QHS Melbourne Abts W, PA-C   2 mg at 01/15/21 2039  . thiamine tablet 100 mg   100 mg Oral Daily Mason Jim, Amy E, MD   100 mg at 01/16/21 0900  . traZODone (DESYREL) tablet 50 mg  50 mg Oral QHS PRN Nwoko, Uchenna E, PA       PTA Medications: Medications Prior to Admission  Medication Sig Dispense Refill Last Dose  . lisinopril (ZESTRIL) 10 MG tablet Take 1 tablet (10 mg total) by mouth daily. 30 tablet 0     Patient Stressors: Legal issue Loss of home Medication change or noncompliance  Patient Strengths: Geographical information systems officer for treatment/growth  Treatment Modalities: Medication Management, Group therapy, Case management,  1 to 1 session with clinician, Psychoeducation, Recreational therapy.   Physician Treatment Plan for Primary Diagnosis: MDD (major depressive disorder), recurrent severe, without psychosis (HCC) Long Term Goal(s): Improvement in symptoms so as ready for discharge Improvement in symptoms so as ready for discharge   Short Term Goals: Ability to identify changes in lifestyle to reduce recurrence of condition will improve Ability to verbalize feelings will improve Ability to disclose and discuss suicidal ideas Ability to demonstrate self-control will improve Ability to identify and develop effective coping behaviors will improve Ability to maintain clinical measurements within normal limits will  improve Compliance with prescribed medications will improve Ability to identify triggers associated with substance abuse/mental health issues will improve Ability to identify changes in lifestyle to reduce recurrence of condition will improve Ability to verbalize feelings will improve Ability to disclose and discuss suicidal ideas Ability to demonstrate self-control will improve Ability to identify and develop effective coping behaviors will improve Ability to maintain clinical measurements within normal limits will improve Compliance with prescribed medications will improve Ability to identify triggers associated with substance  abuse/mental health issues will improve  Medication Management: Evaluate patient's response, side effects, and tolerance of medication regimen.  Therapeutic Interventions: 1 to 1 sessions, Unit Group sessions and Medication administration.  Evaluation of Outcomes: Adequate for Discharge  Physician Treatment Plan for Secondary Diagnosis: Principal Problem:   MDD (major depressive disorder), recurrent severe, without psychosis (HCC)  Long Term Goal(s): Improvement in symptoms so as ready for discharge Improvement in symptoms so as ready for discharge   Short Term Goals: Ability to identify changes in lifestyle to reduce recurrence of condition will improve Ability to verbalize feelings will improve Ability to disclose and discuss suicidal ideas Ability to demonstrate self-control will improve Ability to identify and develop effective coping behaviors will improve Ability to maintain clinical measurements within normal limits will improve Compliance with prescribed medications will improve Ability to identify triggers associated with substance abuse/mental health issues will improve Ability to identify changes in lifestyle to reduce recurrence of condition will improve Ability to verbalize feelings will improve Ability to disclose and discuss suicidal ideas Ability to demonstrate self-control will improve Ability to identify and develop effective coping behaviors will improve Ability to maintain clinical measurements within normal limits will improve Compliance with prescribed medications will improve Ability to identify triggers associated with substance abuse/mental health issues will improve     Medication Management: Evaluate patient's response, side effects, and tolerance of medication regimen.  Therapeutic Interventions: 1 to 1 sessions, Unit Group sessions and Medication administration.  Evaluation of Outcomes: Adequate for Discharge   RN Treatment Plan for Primary  Diagnosis: MDD (major depressive disorder), recurrent severe, without psychosis (HCC) Long Term Goal(s): Knowledge of disease and therapeutic regimen to maintain health will improve  Short Term Goals: Ability to remain free from injury will improve, Ability to participate in decision making will improve, Ability to verbalize feelings will improve, Ability to disclose and discuss suicidal ideas and Ability to identify and develop effective coping behaviors will improve  Medication Management: RN will administer medications as ordered by provider, will assess and evaluate patient's response and provide education to patient for prescribed medication. RN will report any adverse and/or side effects to prescribing provider.  Therapeutic Interventions: 1 on 1 counseling sessions, Psychoeducation, Medication administration, Evaluate responses to treatment, Monitor vital signs and CBGs as ordered, Perform/monitor CIWA, COWS, AIMS and Fall Risk screenings as ordered, Perform wound care treatments as ordered.  Evaluation of Outcomes: Adequate for Discharge   LCSW Treatment Plan for Primary Diagnosis: MDD (major depressive disorder), recurrent severe, without psychosis (HCC) Long Term Goal(s): Safe transition to appropriate next level of care at discharge, Engage patient in therapeutic group addressing interpersonal concerns.  Short Term Goals: Engage patient in aftercare planning with referrals and resources, Increase social support, Increase emotional regulation, Facilitate acceptance of mental health diagnosis and concerns, Identify triggers associated with mental health/substance abuse issues and Increase skills for wellness and recovery  Therapeutic Interventions: Assess for all discharge needs, 1 to 1 time with Social worker, Explore available resources and  support systems, Assess for adequacy in community support network, Educate family and significant other(s) on suicide prevention, Complete  Psychosocial Assessment, Interpersonal group therapy.  Evaluation of Outcomes: Adequate for Discharge   Progress in Treatment: Attending groups: Yes. Participating in groups: Yes. Taking medication as prescribed: Yes. Toleration medication: Yes. Family/Significant other contact made: No, will contact:  Declined Consents  Patient understands diagnosis: Yes. and No. Discussing patient identified problems/goals with staff: Yes. Medical problems stabilized or resolved: Yes. Denies suicidal/homicidal ideation: Yes. Issues/concerns per patient self-inventory: No.   New problem(s) identified: No, Describe:  None  New Short Term/Long Term Goal(s): medication stabilization, elimination of SI thoughts, development of comprehensive mental wellness plan.   Patient Goals:  "To get back on my medications"  Discharge Plan or Barriers: Patient will return home with a friend and will follow up with his [facility] ACTT Team.   Reason for Continuation of Hospitalization: Medication stabilization  Estimated Length of Stay: Adequate for discharge   Attendees: Patient: Chase Moss  01/16/2021   Physician: Pricilla Larsson, MD 01/16/2021   Nursing:  01/16/2021   RN Care Manager: 01/16/2021   Social Worker: Melba Coon, LCSWA 01/16/2021   Recreational Therapist:  01/16/2021   Other:  01/16/2021   Other:  01/16/2021   Other: 01/16/2021     Scribe for Treatment Team: Aram Beecham, LCSWA 01/16/2021 2:28 PM

## 2021-01-16 NOTE — [facility] Suicide Risk Assessment (Signed)
[facility] Discharge Suicide Risk Assessment   Principal Problem: MDD (major depressive disorder), recurrent severe, without psychosis (HCC) Discharge Diagnoses: Principal Problem:   MDD (major depressive disorder), recurrent severe, without psychosis (HCC)   Total Time spent with patient: 30 minutes  Musculoskeletal: Strength & Muscle Tone: within normal limits Gait & Station: normal Patient leans: N/A  Psychiatric Specialty Exam:   Blood pressure 137/84, pulse 90, temperature 97.6 F (36.4 C), temperature source Oral, resp. rate 18, height 5\' 10"  (1.778 m), weight 82.6 kg, SpO2 99 %.Body mass index is 26.11 kg/m.  General Appearance: Casual  Eye Contact::  Fair  Speech:  Clear and Coherent  Volume:  Normal  Mood:  Euthymic  Affect:  Appropriate  Thought Process:  Coherent  Orientation:  Full (Time, Place, and Person)  Thought Content:  Logical  Suicidal Thoughts:  No  Homicidal Thoughts:  No  Memory:  Recent;   Fair  Judgement:  Fair  Insight:  Fair  Psychomotor Activity:  Normal  Concentration:  Fair  Recall:  [id number] of Knowledge:Fair  Language: Fair  Akathisia:  No  Handed:  Right  AIMS (if indicated):     Assets:  Desire for Improvement Resilience  Sleep:  Number of Hours: 6.5  Cognition: WNL  ADL's:  Intact   Mental Status Per Nursing Assessment::   On Admission:  Suicidal ideation indicated by patient,Suicide plan  Demographic Factors:  Male and Caucasian  Loss Factors: Legal issues and Financial problems/change in socioeconomic status  Historical Factors: Family history of mental illness or substance abuse and Impulsivity  Risk Reduction Factors:   Religious beliefs about death, Positive social support, Positive therapeutic relationship and Positive coping skills or problem solving skills  Continued Clinical Symptoms:  Alcohol/Substance Abuse/Dependencies  Cognitive Features That Contribute To Risk:  None    Suicide Risk:  Mild:  Suicidal  ideation of limited frequency, intensity, duration, and specificity.  There are no identifiable plans, no associated intent, mild dysphoria and related symptoms, good self-control (both objective and subjective assessment), few other risk factors, and identifiable protective factors, including available and accessible social support.   Follow-up Information    [facility] Follow up.   Why: ACTT Team services to resume.  Contact information: [address] [place] [phone]               Plan Of Care/Follow-up recommendations:  Other:  Folow-up with outpatient care  [phone], MD 01/16/2021, 11:03 AM

## 2021-01-16 NOTE — Progress Notes (Signed)
D:  Patient denied SI and HI, contracts for safety.  Denied A/V hallucinations.  Denied pain. A:  Medications administered per MD orders.  Emotional support and encouragement given patient. R:  Safety maintained with 15 minute checks.  

## 2021-01-16 NOTE — Progress Notes (Signed)
Discharge:  Patient discharged to his home with a friend.  Patient denied SI and HI.  Denied A/V hallucinations.  Denied pain.  Suicide prevention information given and discussed with patient who stated he understood and had no questions.  Patient stated he received all his belongings, clothing, discharge information, etc.  Patient stated he appreciated all assistance received from [facility] staff.

## 2021-12-24 DEATH — deceased
